# Patient Record
Sex: Male | Born: 1935 | Race: White | Hispanic: No | Smoking: Former smoker
Health system: Southern US, Community
[De-identification: ages and names within clinical notes are randomized; demographics above are authoritative.]

## PROBLEM LIST (undated history)

## (undated) DIAGNOSIS — F0390 Unspecified dementia without behavioral disturbance: Secondary | ICD-10-CM

## (undated) DIAGNOSIS — R0600 Dyspnea, unspecified: Secondary | ICD-10-CM

## (undated) DIAGNOSIS — I4891 Unspecified atrial fibrillation: Secondary | ICD-10-CM

## (undated) DIAGNOSIS — K831 Obstruction of bile duct: Secondary | ICD-10-CM

## (undated) DIAGNOSIS — N138 Other obstructive and reflux uropathy: Secondary | ICD-10-CM

## (undated) DIAGNOSIS — N50819 Testicular pain, unspecified: Secondary | ICD-10-CM

## (undated) DIAGNOSIS — N492 Inflammatory disorders of scrotum: Secondary | ICD-10-CM

## (undated) DIAGNOSIS — N401 Enlarged prostate with lower urinary tract symptoms: Secondary | ICD-10-CM

## (undated) DIAGNOSIS — K5790 Diverticulosis of intestine, part unspecified, without perforation or abscess without bleeding: Secondary | ICD-10-CM

## (undated) DIAGNOSIS — G459 Transient cerebral ischemic attack, unspecified: Secondary | ICD-10-CM

## (undated) DIAGNOSIS — N451 Epididymitis: Secondary | ICD-10-CM

## (undated) DIAGNOSIS — R972 Elevated prostate specific antigen [PSA]: Secondary | ICD-10-CM

## (undated) DIAGNOSIS — R351 Nocturia: Secondary | ICD-10-CM

## (undated) DIAGNOSIS — N200 Calculus of kidney: Secondary | ICD-10-CM

## (undated) DIAGNOSIS — R339 Retention of urine, unspecified: Secondary | ICD-10-CM

## (undated) DIAGNOSIS — Z87442 Personal history of urinary calculi: Secondary | ICD-10-CM

## (undated) DIAGNOSIS — K8020 Calculus of gallbladder without cholecystitis without obstruction: Secondary | ICD-10-CM

## (undated) DIAGNOSIS — I499 Cardiac arrhythmia, unspecified: Secondary | ICD-10-CM

## (undated) DIAGNOSIS — I729 Aneurysm of unspecified site: Secondary | ICD-10-CM

## (undated) DIAGNOSIS — J449 Chronic obstructive pulmonary disease, unspecified: Secondary | ICD-10-CM

## (undated) DIAGNOSIS — E785 Hyperlipidemia, unspecified: Secondary | ICD-10-CM

## (undated) DIAGNOSIS — R310 Gross hematuria: Secondary | ICD-10-CM

## (undated) HISTORY — DX: Obstruction of bile duct: K83.1

---

## 2008-03-06 ENCOUNTER — Ambulatory Visit: Payer: Self-pay | Admitting: Family Medicine

## 2008-03-18 ENCOUNTER — Ambulatory Visit: Payer: Self-pay | Admitting: Urology

## 2008-03-21 ENCOUNTER — Ambulatory Visit: Payer: Self-pay | Admitting: Urology

## 2008-10-01 ENCOUNTER — Ambulatory Visit: Payer: Self-pay | Admitting: Family Medicine

## 2012-07-17 ENCOUNTER — Ambulatory Visit: Payer: Self-pay | Admitting: Urology

## 2012-08-14 ENCOUNTER — Emergency Department: Payer: Self-pay | Admitting: Emergency Medicine

## 2012-08-14 LAB — COMPREHENSIVE METABOLIC PANEL
Alkaline Phosphatase: 90 U/L (ref 50–136)
BUN: 17 mg/dL (ref 7–18)
Bilirubin,Total: 0.9 mg/dL (ref 0.2–1.0)
Chloride: 102 mmol/L (ref 98–107)
Co2: 26 mmol/L (ref 21–32)
Creatinine: 0.96 mg/dL (ref 0.60–1.30)
EGFR (Non-African Amer.): >60
Glucose: 122 mg/dL — ABNORMAL HIGH (ref 65–99)
Osmolality: 271 (ref 275–301)
SGPT (ALT): 15 U/L (ref 12–78)
Sodium: 134 mmol/L — ABNORMAL LOW (ref 136–145)
Total Protein: 7.1 g/dL (ref 6.4–8.2)

## 2012-08-14 LAB — URINALYSIS, COMPLETE
Bilirubin,UR: NEGATIVE
Glucose,UR: NEGATIVE mg/dL (ref 0–75)
Ketone: NEGATIVE
Ph: 5 (ref 4.5–8.0)
RBC,UR: 209 /HPF (ref 0–5)
Squamous Epithelial: NONE SEEN
WBC UR: 627 /HPF (ref 0–5)

## 2012-08-14 LAB — CBC
HCT: 37.8 % — ABNORMAL LOW (ref 40.0–52.0)
MCHC: 33.2 g/dL (ref 32.0–36.0)
RDW: 12.4 % (ref 11.5–14.5)
WBC: 15.9 10*3/uL — ABNORMAL HIGH (ref 3.8–10.6)

## 2012-08-20 LAB — CULTURE, BLOOD (SINGLE)

## 2012-10-06 DIAGNOSIS — R339 Retention of urine, unspecified: Secondary | ICD-10-CM | POA: Insufficient documentation

## 2012-10-06 DIAGNOSIS — N400 Enlarged prostate without lower urinary tract symptoms: Secondary | ICD-10-CM | POA: Insufficient documentation

## 2013-08-14 ENCOUNTER — Ambulatory Visit: Payer: Self-pay | Admitting: Urology

## 2013-09-17 ENCOUNTER — Emergency Department: Payer: Self-pay | Admitting: Emergency Medicine

## 2013-09-17 LAB — CBC
HCT: 43 % (ref 40.0–52.0)
HGB: 14.2 g/dL (ref 13.0–18.0)
MCHC: 33.1 g/dL (ref 32.0–36.0)
MCV: 91 fL (ref 80–100)
Platelet: 183 10*3/uL (ref 150–440)
RDW: 13.9 % (ref 11.5–14.5)

## 2013-09-17 LAB — CK TOTAL AND CKMB (NOT AT ARMC): CK-MB: 1.4 ng/mL (ref 0.5–3.6)

## 2013-09-17 LAB — COMPREHENSIVE METABOLIC PANEL
Albumin: 3.4 g/dL (ref 3.4–5.0)
BUN: 11 mg/dL (ref 7–18)
Bilirubin,Total: 0.5 mg/dL (ref 0.2–1.0)
Calcium, Total: 8.9 mg/dL (ref 8.5–10.1)
Chloride: 107 mmol/L (ref 98–107)
EGFR (African American): >60
EGFR (Non-African Amer.): >60
Glucose: 111 mg/dL — ABNORMAL HIGH (ref 65–99)
Osmolality: 276 (ref 275–301)
SGOT(AST): 22 U/L (ref 15–37)

## 2013-09-17 LAB — TROPONIN I: Troponin-I: <0.02 ng/mL

## 2013-10-24 ENCOUNTER — Ambulatory Visit: Payer: Self-pay | Admitting: Cardiovascular Disease

## 2014-06-21 ENCOUNTER — Inpatient Hospital Stay: Payer: Self-pay | Admitting: Internal Medicine

## 2014-06-21 LAB — COMPREHENSIVE METABOLIC PANEL
ALK PHOS: 107 U/L
ALT: 55 U/L
Albumin: 3.8 g/dL (ref 3.4–5.0)
Anion Gap: 6 — ABNORMAL LOW (ref 7–16)
BUN: 17 mg/dL (ref 7–18)
Bilirubin,Total: 3 mg/dL — ABNORMAL HIGH (ref 0.2–1.0)
CALCIUM: 8.5 mg/dL (ref 8.5–10.1)
CREATININE: 1.31 mg/dL — AB (ref 0.60–1.30)
Chloride: 104 mmol/L (ref 98–107)
Co2: 28 mmol/L (ref 21–32)
EGFR (African American): >60
EGFR (Non-African Amer.): 56 — ABNORMAL LOW
Glucose: 110 mg/dL — ABNORMAL HIGH (ref 65–99)
Osmolality: 278 (ref 275–301)
POTASSIUM: 3.7 mmol/L (ref 3.5–5.1)
SGOT(AST): 85 U/L — ABNORMAL HIGH (ref 15–37)
Sodium: 138 mmol/L (ref 136–145)
Total Protein: 7.7 g/dL (ref 6.4–8.2)

## 2014-06-21 LAB — PHOSPHORUS: Phosphorus: 2.8 mg/dL (ref 2.5–4.9)

## 2014-06-21 LAB — URINALYSIS, COMPLETE
Bilirubin,UR: NEGATIVE
Glucose,UR: NEGATIVE mg/dL (ref 0–75)
NITRITE: NEGATIVE
Ph: 5 (ref 4.5–8.0)
Protein: NEGATIVE
SQUAMOUS EPITHELIAL: NONE SEEN
Specific Gravity: 1.019 (ref 1.003–1.030)
WBC UR: 15 /HPF (ref 0–5)

## 2014-06-21 LAB — CBC WITH DIFFERENTIAL/PLATELET
Basophil #: 0.1 10*3/uL (ref 0.0–0.1)
Basophil %: 0.7 %
EOS PCT: 0 %
Eosinophil #: 0 10*3/uL (ref 0.0–0.7)
HCT: 46.3 % (ref 40.0–52.0)
HGB: 15.5 g/dL (ref 13.0–18.0)
LYMPHS PCT: 2.7 %
Lymphocyte #: 0.3 10*3/uL — ABNORMAL LOW (ref 1.0–3.6)
MCH: 31.8 pg (ref 26.0–34.0)
MCHC: 33.5 g/dL (ref 32.0–36.0)
MCV: 95 fL (ref 80–100)
MONO ABS: 0.4 x10 3/mm (ref 0.2–1.0)
MONOS PCT: 3.2 %
NEUTROS PCT: 93.4 %
Neutrophil #: 12 10*3/uL — ABNORMAL HIGH (ref 1.4–6.5)
Platelet: 169 10*3/uL (ref 150–440)
RBC: 4.87 10*6/uL (ref 4.40–5.90)
RDW: 13.3 % (ref 11.5–14.5)
WBC: 12.8 10*3/uL — ABNORMAL HIGH (ref 3.8–10.6)

## 2014-06-21 LAB — PROTIME-INR
INR: 1.2
Prothrombin Time: 15.3 secs — ABNORMAL HIGH (ref 11.5–14.7)

## 2014-06-21 LAB — MAGNESIUM: Magnesium: 1.3 mg/dL — ABNORMAL LOW

## 2014-06-21 LAB — TROPONIN I: Troponin-I: <0.02 ng/mL

## 2014-06-22 LAB — BASIC METABOLIC PANEL
ANION GAP: 5 — AB (ref 7–16)
BUN: 15 mg/dL (ref 7–18)
CO2: 25 mmol/L (ref 21–32)
Calcium, Total: 7.6 mg/dL — ABNORMAL LOW (ref 8.5–10.1)
Chloride: 113 mmol/L — ABNORMAL HIGH (ref 98–107)
Creatinine: 1.21 mg/dL (ref 0.60–1.30)
EGFR (African American): >60
Glucose: 129 mg/dL — ABNORMAL HIGH (ref 65–99)
Osmolality: 288 (ref 275–301)
Potassium: 4 mmol/L (ref 3.5–5.1)
Sodium: 143 mmol/L (ref 136–145)

## 2014-06-22 LAB — BILIRUBIN, TOTAL: Bilirubin,Total: 3.9 mg/dL — ABNORMAL HIGH (ref 0.2–1.0)

## 2014-06-22 LAB — CBC WITH DIFFERENTIAL/PLATELET
BASOS PCT: 0.1 %
Basophil #: 0 10*3/uL (ref 0.0–0.1)
EOS ABS: 0 10*3/uL (ref 0.0–0.7)
Eosinophil %: 0 %
HCT: 38.9 % — AB (ref 40.0–52.0)
HGB: 12.9 g/dL — AB (ref 13.0–18.0)
LYMPHS PCT: 4.3 %
Lymphocyte #: 0.7 10*3/uL — ABNORMAL LOW (ref 1.0–3.6)
MCH: 31.4 pg (ref 26.0–34.0)
MCHC: 33 g/dL (ref 32.0–36.0)
MCV: 95 fL (ref 80–100)
Monocyte #: 1.3 x10 3/mm — ABNORMAL HIGH (ref 0.2–1.0)
Monocyte %: 7.5 %
NEUTROS PCT: 88.1 %
Neutrophil #: 15.1 10*3/uL — ABNORMAL HIGH (ref 1.4–6.5)
Platelet: 159 10*3/uL (ref 150–440)
RBC: 4.09 10*6/uL — ABNORMAL LOW (ref 4.40–5.90)
RDW: 13.6 % (ref 11.5–14.5)
WBC: 17.2 10*3/uL — ABNORMAL HIGH (ref 3.8–10.6)

## 2014-06-22 LAB — TSH: Thyroid Stimulating Horm: 0.343 u[IU]/mL — ABNORMAL LOW

## 2014-06-22 LAB — BILIRUBIN, DIRECT: Bilirubin, Direct: 2.3 mg/dL — ABNORMAL HIGH (ref 0.00–0.20)

## 2014-06-22 LAB — HEMOGLOBIN A1C: Hemoglobin A1C: 5.8 % (ref 4.2–6.3)

## 2014-06-22 LAB — MAGNESIUM: MAGNESIUM: 2.3 mg/dL

## 2014-06-23 LAB — COMPREHENSIVE METABOLIC PANEL
ALBUMIN: 2.6 g/dL — AB (ref 3.4–5.0)
ANION GAP: 6 — AB (ref 7–16)
Alkaline Phosphatase: 113 U/L
BUN: 19 mg/dL — ABNORMAL HIGH (ref 7–18)
Bilirubin,Total: 3.1 mg/dL — ABNORMAL HIGH (ref 0.2–1.0)
CALCIUM: 7.5 mg/dL — AB (ref 8.5–10.1)
Chloride: 112 mmol/L — ABNORMAL HIGH (ref 98–107)
Co2: 25 mmol/L (ref 21–32)
Creatinine: 1.15 mg/dL (ref 0.60–1.30)
Glucose: 111 mg/dL — ABNORMAL HIGH (ref 65–99)
Osmolality: 288 (ref 275–301)
POTASSIUM: 3.6 mmol/L (ref 3.5–5.1)
SGOT(AST): 41 U/L — ABNORMAL HIGH (ref 15–37)
SGPT (ALT): 59 U/L
Sodium: 143 mmol/L (ref 136–145)
Total Protein: 5.7 g/dL — ABNORMAL LOW (ref 6.4–8.2)

## 2014-06-23 LAB — CBC WITH DIFFERENTIAL/PLATELET
BASOS ABS: 0 10*3/uL (ref 0.0–0.1)
Basophil %: 0.4 %
EOS ABS: 0.1 10*3/uL (ref 0.0–0.7)
Eosinophil %: 0.6 %
HCT: 38.4 % — ABNORMAL LOW (ref 40.0–52.0)
HGB: 12.3 g/dL — ABNORMAL LOW (ref 13.0–18.0)
LYMPHS ABS: 0.7 10*3/uL — AB (ref 1.0–3.6)
Lymphocyte %: 6.5 %
MCH: 30.7 pg (ref 26.0–34.0)
MCHC: 32 g/dL (ref 32.0–36.0)
MCV: 96 fL (ref 80–100)
MONOS PCT: 9.6 %
Monocyte #: 1 x10 3/mm (ref 0.2–1.0)
NEUTROS ABS: 9 10*3/uL — AB (ref 1.4–6.5)
Neutrophil %: 82.9 %
PLATELETS: 136 10*3/uL — AB (ref 150–440)
RBC: 4 10*6/uL — AB (ref 4.40–5.90)
RDW: 13.7 % (ref 11.5–14.5)
WBC: 10.9 10*3/uL — AB (ref 3.8–10.6)

## 2014-06-23 LAB — URINE CULTURE

## 2014-06-23 LAB — CULTURE, BLOOD (SINGLE)

## 2014-06-24 LAB — HEPATIC FUNCTION PANEL A (ARMC)
AST: 28 U/L (ref 15–37)
Albumin: 2.6 g/dL — ABNORMAL LOW (ref 3.4–5.0)
Alkaline Phosphatase: 140 U/L — ABNORMAL HIGH
BILIRUBIN TOTAL: 1.8 mg/dL — AB (ref 0.2–1.0)
Bilirubin, Direct: 0.9 mg/dL — ABNORMAL HIGH (ref 0.00–0.20)
SGPT (ALT): 48 U/L
Total Protein: 5.8 g/dL — ABNORMAL LOW (ref 6.4–8.2)

## 2014-06-27 LAB — CULTURE, BLOOD (SINGLE)

## 2014-07-22 ENCOUNTER — Ambulatory Visit: Payer: Self-pay | Admitting: Gastroenterology

## 2014-10-01 DIAGNOSIS — I4891 Unspecified atrial fibrillation: Secondary | ICD-10-CM | POA: Insufficient documentation

## 2014-10-01 DIAGNOSIS — E7800 Pure hypercholesterolemia, unspecified: Secondary | ICD-10-CM | POA: Insufficient documentation

## 2014-11-12 ENCOUNTER — Ambulatory Visit: Payer: Self-pay | Admitting: Cardiovascular Disease

## 2015-01-14 NOTE — Consult Note (Signed)
PATIENT NAME:  Chris Simon, Chris Simon MR#:  [id number] DATE OF BIRTH:  Feb 11, 1934  DATE OF CONSULTATION:  08/14/2012  REFERRING PHYSICIAN:  Dr. Lovena Le  CONSULTING PHYSICIAN:  Otelia Limes. Yves Dill, MD  REASON FOR CONSULTATION:  Left testicular swelling.   HISTORY OF PRESENT ILLNESS: Chris Simon is a 81 year old white male with a two-day history of left testicular pain and swelling. He denies perineal trauma. He apparently has urinary retention and he has had a Foley catheter in for about a month now. He has been on Cipro recently. He is normally followed by Dr. Charyl Dancer. His records are not available to me at this time.   PHYSICAL EXAMINATION:   The patient was uncircumcised. There was some erythema of the left hemiscrotum. Left epididymis was swollen and tender, right testis atrophic, 18 mL in size, left testis 18 mL in size. There was no crepitus or fluctuance of the scrotal skin.   Scrotal ultrasound report was consistent with epididymitis and bilateral hydroceles and left varicocele.   PERTINENT LABORATORY STUDIES: BUN of 17 and creatinine of 0.96. CBC indicated a white cell count of 15,900 with hematocrit of 37.8%.   IMPRESSION:  1. Left epididymitis.  2. Urinary retention.   PLAN:  1. Septra DS one p.o. b.i.d.  2. Urine for culture and sensitivities and switch antibiotics if indicated.  3. Drink plenty of fluids.  4. Scrotal support.      5. Follow up with Dr. Madelin Headings as scheduled tomorrow.      ____________________________ Otelia Limes. Yves Dill, MD mrw:bjt D: 08/14/2012 14:30:00 ET T: 08/14/2012 15:12:18 ET JOB#: [id number]  cc: Otelia Limes. Yves Dill, MD, <Dictator> Royston Cowper MD ELECTRONICALLY SIGNED 08/14/2012 16:56

## 2015-01-18 NOTE — H&P (Signed)
PATIENT NAME:  Chris Simon, Chris Simon MR#:  [id number] DATE OF BIRTH:  1934-07-28  DATE OF ADMISSION:  06/21/2014  PRIMARY CARE PHYSICIAN: [name], MD.  REFERRING PHYSICIAN: Verdia Kuba. Paduchowski, MD.  CHIEF COMPLAINT: Shaking, started today.   HISTORY OF PRESENT ILLNESS: A 81 year old, Caucasian male with a history of CAD, Afib, BPH, diverticulitis presented to the ED with the above chief complaint. The patient is alert, awake, oriented, in no acute distress. According to the patient and the patient's daughter, the patient was fine until today. The patient started to have shaking. In addition, the patient has a fever with a temperature of 100.3 in the ED. The patient's has urinary frequency and dark urine, but denies any other urinary symptoms. The patient denies any chest pain, palpitations, but the patient said that he fell by accident yesterday. The patient denies any abdominal pain, nausea, vomiting or diarrhea, but has bilateral flank pain.   PAST MEDICAL HISTORY: CAD, Afib, BPH, diverticulitis.   SOCIAL HISTORY: No smoking, drinking or illicit drugs.   FAMILY HISTORY: Father and brother had prostate cancer. No hypertension, diabetes, heart attack in his family.   PAST SURGICAL HISTORY: Diverticulitis surgery a long time ago.   ALLERGIES: None.   HOME MEDICATIONS: Flomax 0.4 mg p.o. daily, Zocor 40 mg p.o. at bedtime, Lopressor 25 mg p.o. daily, finasteride 5 mg p.o. daily, Eliquis 5 mg p.o. b.i.d., amiodarone 200 mg p.o. daily.   REVIEW OF SYSTEMS:   CONSTITUTIONAL: Patient has a fever, chills, but no headache or dizziness.  EYES: No double vision, blurred vision.   ENT: No postnasal drip, slurred speech or dysphagia.  CARDIOVASCULAR: No chest pain, palpitations, orthopnea, nocturnal dyspnea. No leg edema.  PULMONARY: No cough, sputum, shortness of breath or hemoptysis.  GASTROINTESTINAL: No abdominal pain, nausea, vomiting, diarrhea. No melena or bloody stool.  GENITOURINARY:  Dysuria, hematuria or incontinence.  SKIN: No rash or jaundice.  NEUROLOGY: No syncope, loss of consciousness or seizure.  ENDOCRINOLOGY: No polyuria, polydipsia, heat or cold intolerance.  HEMATOLOGY: No easy bleeding or bleeding.   PHYSICAL EXAMINATION: VITAL SIGNS: Temperature 100.3, blood pressure 133/69, pulse 120 and now decreased to 111, oxygen saturation 96% on oxygen.  GENERAL: The patient is alert, awake, oriented, in no acute distress.  HEENT: Pupils round, equally reactive to light and accommodation.  Moist mucosa. Clear oropharynx. NECK: Supple. No JVD or carotid bruit. No lymphadenopathy. No thyromegaly.  CARDIOVASCULAR: S1 and S2. Regular rate, tachycardia. No murmurs. No gallops. PULMONARY: Bilateral air entry. No wheezing or rales. No use of accessory muscles to breathe.  ABDOMEN: Soft. No distention or tenderness. No organomegaly. Bowel sounds present.  EXTREMITIES: No edema, clubbing or cyanosis. No calf tenderness. Bilateral pedal pulses present.  SKIN: No rash or jaundice.  NEUROLOGIC: A and O x 3. No focal deficits. Power 5/5. Sensation intact.   LABORATORY DATA: Urinalysis showed WBC 15, RBC 5, nitrite negative.  ABG showed a pH of 7.45, P)2 of 65, pCO2 of 33. Lactic acid 1.0. WBC 12.8, hemoglobin 15.5, platelets 169,000. Glucose 110. BUN 17, creatinine 1.31. Electrolytes normal except magnesium 1.3. Troponin less than 0.02. Phosphate 2.8. INR 1.2.   DIAGNOSTIC DATA: CT scan of head: No acute intracranial pathology. Chest x-ray: No acute disease.  IMPRESSIONS: 1. Sepsis.  2. Urinary tract infection.  3. Mild dehydration.  4. Hypomagnesemia.  5. Atrial fibrillation with rapid ventricular response.  6. Benign prostatic hypertrophy.  7. History of coronary artery disease.   PLAN OF TREATMENT: 1.  The patient will be admitted to the medical floor with telemonitor. We will start Rocephin IVPB, follow up urine culture, blood culture and a CBC.  2. Hypomagnesemia. We  will give magnesium supplement and follow up level.  3. In addition, we will give IV fluid support. Follow up BMP.  4. Coronary artery disease and atrial fibrillation with rapid ventricular response. We will continue Eliquis, amiodarone and Lopressor.  5. I discussed the patient's condition and plan of treatment with the patient and the patient's daughter.   CODE STATUS: The patient is full code.   TIME SPENT: About 52 minutes.     ____________________________ Demetrios Loll, MD qc:TT D: 06/21/2014 20:03:32 ET T: 06/21/2014 21:18:47 ET JOB#: [id number]  cc: Demetrios Loll, MD, <Dictator> Demetrios Loll MD ELECTRONICALLY SIGNED 06/21/2014 22:26

## 2015-01-18 NOTE — Consult Note (Signed)
Brief Consult Note: Diagnosis: recent fall, fever, jaundice and elevated wbc, ? cholecystitis.   Patient was seen by consultant.   Consult note dictated.   Recommend to proceed with surgery or procedure.   Recommend further assessment or treatment.   Orders entered.   Discussed with Attending MD.   Comments: stop Eliquis HIDA scan   any surgery would have to wait until monday am to give anticoag time to reverse.  Electronic Signatures: Sherri Rad (MD)  (Signed 26-Sep-15 12:02)  Authored: Brief Consult Note   Last Updated: 26-Sep-15 12:02 by Sherri Rad (MD)

## 2015-01-18 NOTE — Discharge Summary (Signed)
PATIENT NAME:  Chris Simon, Chris Simon MR#:  [id number] DATE OF BIRTH:  1933/11/16  DATE OF ADMISSION:  06/21/2014 DATE OF DISCHARGE:  06/24/2014  DISCHARGE DIAGNOSES:  1.  Escherichia coli sepsis due to urinary tract infection.  2.  Chronic cholecystitis.  3.  Atrial fibrillation with rapid ventricular rate.   SECONDARY DIAGNOSES:  1.  Coronary artery disease.  2.  Atrial fibrillation.  3.  Benign prostatic hypertrophy.  4.  Diverticulitis.   CONSULTATION: Surgery, Sherri Rad, MD.   PROCEDURES AND RADIOLOGY: Chest x-ray on the 25th of September showed no active disease.   CT scan of the head without contrast on the 25th of September showed no acute intracranial pathology.    Hepatobiliary scan on the 26th of September showed chronic cholecystitis.   Abdominal ultrasound on the 26th of September showed cholelithiasis with gallbladder wall thickening and pericholecystic/perihepatic fluid, findings concerning for acute cholecystitis.   Urinalysis on admission showed 15 WBCs with bacteria, trace leukocyte esterase.   Urine culture grew 70,000 colonies of Escherichia coli. Blood cultures x 4 grew Escherichia coli.   HISTORY AND SHORT HOSPITAL COURSE: The patient is a 81 year old male with above-mentioned medical problems who was admitted for shaking and fever concerning for sepsis, thought to be secondary to urinary tract infection. Please see Dr. Lianne Moris dictated history and physical for further details. There was also concern for possible cholecystitis, for which surgical consultation was obtained who recommended HIDA scan which was performed, showing chronic cholecystitis. Initial ultrasound was concerning for acute cholecystitis although this was verified by surgery, Dr. Marina Gravel, with HIDA scan. Both were in favor of chronic cholecystitis. The patient's sepsis/bacteremia was Escherichia coli, thought to be from urinary tract infection. The patient was feeling much better by the 28th of September and is  being discharged home in stable condition.   PHYSICAL EXAMINATION:  VITAL SIGNS: On the date of discharge, his vital signs are as follows: Temperature 98.6, heart rate 82 per minute, respirations 20 per minute, blood pressure 158/91. He was saturating 95% on room. Pertinent physical examination on the date of discharge: CARDIOVASCULAR: S1, S2 normal. No murmurs, rubs or gallops.  LUNGS: Clear to auscultation bilaterally. No wheezing, rales, rhonchi or crepitation.  ABDOMEN: Soft, benign.  NEUROLOGIC: Nonfocal examination. All other physical examination remained at baseline.   DISCHARGE MEDICATIONS:  1.  Amiodarone 200 mg p.o. daily. 2.  Finasteride 5 mg p.o. daily.  3.  Tamsulosin 0.4 mg p.o. daily.  4.  Simvastatin 40 mg p.o. at bedtime.  5.  Metoprolol 25 mg p.o. daily. 6.  Eliquis 5 mg p.o. b.i.d.  7.  Ceftin 500 mg p.o. b.i.d. for 11 days.   DISCHARGE DIET: Low sodium.   DISCHARGE ACTIVITY: As tolerated.   DISCHARGE INSTRUCTIONS AND FOLLOWUP: The patient was instructed to followup with Dr. Thereasa Distance, who is his primary care physician in 1-2 weeks. He will need followup with surgery, Dr. Sherri Rad, in 2-4 weeks.   TOTAL TIME DISCHARGING THIS PATIENT: Fifty-five minutes.  Please note, the patient's wife was assessed by hospice on the 27th of September and was given a very poor prognosis, about 2-3 days to live, for which family really wanted the patient to be discharged as soon as possible this morning so that he can see his wife, which was discussed with a family member and the patient was not informed about that as the family wanted him to first get home and then make him aware about the situation. Due to this  reason, his discharge orders were expedited.    ____________________________ Chris Dunton S. Chris Ghazi, MD vss:TT D: 06/24/2014 22:05:51 ET T: 06/24/2014 22:25:55 ET JOB#: [id number]  cc: [name] S. Chris Ghazi, MD, <Dictator> [name], MD [name], [place]  MD ELECTRONICALLY SIGNED 07/02/2014 15:46

## 2015-01-18 NOTE — Consult Note (Signed)
PATIENT NAME:  Chris Simon, Chris Simon MR#:  [id number] DATE OF BIRTH:  06-21-1934  DATE OF CONSULTATION:  06/22/2014  REFERRING PHYSICIAN:   CONSULTING PHYSICIAN:  Fabion Gatson A. Marina Gravel, MD  REASON FOR CONSULTATION: Possible acute cholecystitis.   HISTORY: This 81 year old white male admitted on the September 25 with fever to 100.3, chills, and some bilateral flank pain. Pain has since resolved.   Prior to admission he sustained a fall without LOC. The patient is a bit confused as to his history as to why he is in the hospital. Upon my evaluation the patient denied any abdominal pain, nausea, vomiting, diarrhea. He was found to have elevated bilirubin upon admission as well as a white count of 12,800. This prompted an ultrasound of the right upper quadrant which I have personally reviewed. Findings on the ultrasound demonstrate pericholecystic fluid, gallbladder wall thickening, bile duct measuring 4 mm. There is evidence of cholelithiasis. Concerns were for acute cholecystitis and as such surgical services were asked to evaluate the patient. Again the patient denies any current abdominal pain or GI symptoms.   Yesterday his white count was found to be elevated at 17.2. His bilirubin found to be 3.9 with a direct fraction of 2.3, but normal transaminases and alkaline phosphatase. Electrolytes are unremarkable. The patient denies any previous abdominal pain as such.    ALLERGIES: None.   HOME MEDICATIONS:  1.  Amiodarone.   2.  Eliquis 5 mg b.i.d.  3.  Finasteride 5 mg by mouth once a day.  4.  Metoprolol 25 mg extended-release once a day.  5.  Simvastatin 40 mg once a day.  6.  Flomax 0.4 mg by mouth once a day.   PAST MEDICAL HISTORY: Significant for atrial fibrillation requiring Eliquis, coronary artery disease, benign prostatic hypertrophy, history of diverticulosis, and diverticulitis.   SOCIAL HISTORY: The patient denies smoking, drinking, or drugs.   FAMILY HISTORY: Significant for prostate cancer.    PAST SURGICAL HISTORY: Colon surgery for diverticulitis in the distant past.   REVIEW OF SYSTEMS:  As described above, significant for chills, fevers. No abdominal pain. No nausea, no vomiting, no bloody diarrhea.   PHYSICAL EXAMINATION:  GENERAL: The patient is obviously in no distress. He has mild scleral icterus.  VITAL SIGNS: Temperature is 98.1, pulse of 76, blood pressure is 118/70. Pulse oximetry on room air is 96%.  LUNGS: Clear.  HEART: Irregularly irregular.  ABDOMEN: Soft and nontender. There is no Murphy sign. There are no peritoneal signs. There are no hernias. There is no tenderness in the right upper quadrant or in the left upper quadrant or in the epigastric region.  EXTREMITIES: Warm and well perfused.  NEUROLOGIC AND PSYCHIATRIC: Grossly normal.   LABORATORY VALUES: Glucose 129, creatinine 1.21, sodium 143, potassium 4.0, chloride 113. Total bilirubin 3.9, direct fraction 2.3, AST 85, ALT 55, alkaline phosphatase 107. White count is 17.2, hemoglobin 12.9, hematocrit 38.9, platelet count 159,000, with left shift.   IMPRESSION: Fever, leukocytosis, cholelithiasis, and no abdominal findings or history of abdominal pain. The patient did have some flank pain.   RECOMMENDATIONS: I would proceed with a HIDA scan and hold his Eliquis in the need of any case of operative intervention as this requires 2 days for reversal. I discussed this with [facility] medical physician and they agreed to proceed in this fashion. I will follow up with you after the HIDA scan which has been ordered for today.     ____________________________ [name], MD mab:bu D: 06/23/2014 08:49:39  ET T: 06/23/2014 13:03:00 ET JOB#: [id number]  cc: Elta Guadeloupe A. Marina Gravel, MD, <Dictator> Hortencia Conradi MD ELECTRONICALLY SIGNED 06/23/2014 16:56

## 2015-04-14 ENCOUNTER — Other Ambulatory Visit: Payer: Self-pay | Admitting: Family Medicine

## 2015-04-14 ENCOUNTER — Ambulatory Visit
Admission: RE | Admit: 2015-04-14 | Discharge: 2015-04-14 | Disposition: A | Discharge status: Home / Self Care | Payer: Medicare HMO | Source: Ambulatory Visit | Attending: Family Medicine | Admitting: Family Medicine

## 2015-04-14 DIAGNOSIS — N508 Other specified disorders of male genital organs: Secondary | ICD-10-CM | POA: Diagnosis present

## 2015-04-14 DIAGNOSIS — N50811 Right testicular pain: Secondary | ICD-10-CM

## 2015-04-14 DIAGNOSIS — N4400 Torsion of testis, unspecified: Secondary | ICD-10-CM

## 2015-04-14 DIAGNOSIS — N5089 Other specified disorders of the male genital organs: Secondary | ICD-10-CM

## 2015-04-14 DIAGNOSIS — N451 Epididymitis: Secondary | ICD-10-CM | POA: Diagnosis not present

## 2015-04-14 DIAGNOSIS — N452 Orchitis: Secondary | ICD-10-CM | POA: Insufficient documentation

## 2015-07-07 ENCOUNTER — Other Ambulatory Visit: Payer: Self-pay | Admitting: Urology

## 2015-07-08 ENCOUNTER — Encounter: Payer: Self-pay | Admitting: Emergency Medicine

## 2015-07-08 ENCOUNTER — Emergency Department: Payer: Medicare HMO

## 2015-07-08 ENCOUNTER — Emergency Department
Admission: EM | Admit: 2015-07-08 | Discharge: 2015-07-08 | Disposition: A | Discharge status: Home / Self Care | Payer: Medicare HMO | Source: Home / Self Care | Attending: Emergency Medicine | Admitting: Emergency Medicine

## 2015-07-08 ENCOUNTER — Emergency Department
Admission: EM | Admit: 2015-07-08 | Discharge: 2015-07-08 | Disposition: A | Discharge status: Other Acute Inpatient Hospital | Payer: Medicare HMO | Source: Home / Self Care | Attending: Emergency Medicine | Admitting: Emergency Medicine

## 2015-07-08 ENCOUNTER — Inpatient Hospital Stay (HOSPITAL_COMMUNITY)
Admission: AD | Admit: 2015-07-08 | Discharge: 2015-07-14 | DRG: 853 | Disposition: A | Discharge status: Home Health | Payer: Medicare HMO | Source: Other Acute Inpatient Hospital | Attending: Internal Medicine | Admitting: Internal Medicine

## 2015-07-08 DIAGNOSIS — K8066 Calculus of gallbladder and bile duct with acute and chronic cholecystitis without obstruction: Secondary | ICD-10-CM | POA: Diagnosis not present

## 2015-07-08 DIAGNOSIS — Z7982 Long term (current) use of aspirin: Secondary | ICD-10-CM | POA: Diagnosis not present

## 2015-07-08 DIAGNOSIS — K8030 Calculus of bile duct with cholangitis, unspecified, without obstruction: Secondary | ICD-10-CM | POA: Diagnosis not present

## 2015-07-08 DIAGNOSIS — E785 Hyperlipidemia, unspecified: Secondary | ICD-10-CM | POA: Diagnosis present

## 2015-07-08 DIAGNOSIS — I480 Paroxysmal atrial fibrillation: Secondary | ICD-10-CM | POA: Insufficient documentation

## 2015-07-08 DIAGNOSIS — N401 Enlarged prostate with lower urinary tract symptoms: Secondary | ICD-10-CM | POA: Diagnosis present

## 2015-07-08 DIAGNOSIS — G9341 Metabolic encephalopathy: Secondary | ICD-10-CM | POA: Diagnosis present

## 2015-07-08 DIAGNOSIS — K8033 Calculus of bile duct with acute cholangitis with obstruction: Secondary | ICD-10-CM

## 2015-07-08 DIAGNOSIS — Z79899 Other long term (current) drug therapy: Secondary | ICD-10-CM | POA: Diagnosis not present

## 2015-07-08 DIAGNOSIS — R739 Hyperglycemia, unspecified: Secondary | ICD-10-CM | POA: Diagnosis present

## 2015-07-08 DIAGNOSIS — A4150 Gram-negative sepsis, unspecified: Principal | ICD-10-CM | POA: Diagnosis present

## 2015-07-08 DIAGNOSIS — N390 Urinary tract infection, site not specified: Secondary | ICD-10-CM | POA: Diagnosis present

## 2015-07-08 DIAGNOSIS — R6521 Severe sepsis with septic shock: Secondary | ICD-10-CM | POA: Diagnosis present

## 2015-07-08 DIAGNOSIS — Z87891 Personal history of nicotine dependence: Secondary | ICD-10-CM | POA: Diagnosis not present

## 2015-07-08 DIAGNOSIS — N138 Other obstructive and reflux uropathy: Secondary | ICD-10-CM | POA: Diagnosis not present

## 2015-07-08 DIAGNOSIS — K810 Acute cholecystitis: Secondary | ICD-10-CM | POA: Diagnosis present

## 2015-07-08 DIAGNOSIS — K8050 Calculus of bile duct without cholangitis or cholecystitis without obstruction: Secondary | ICD-10-CM | POA: Diagnosis not present

## 2015-07-08 DIAGNOSIS — W19XXXA Unspecified fall, initial encounter: Secondary | ICD-10-CM | POA: Diagnosis not present

## 2015-07-08 DIAGNOSIS — A419 Sepsis, unspecified organism: Secondary | ICD-10-CM

## 2015-07-08 DIAGNOSIS — K4090 Unilateral inguinal hernia, without obstruction or gangrene, not specified as recurrent: Secondary | ICD-10-CM | POA: Diagnosis not present

## 2015-07-08 DIAGNOSIS — A4151 Sepsis due to Escherichia coli [E. coli]: Secondary | ICD-10-CM | POA: Diagnosis not present

## 2015-07-08 DIAGNOSIS — R319 Hematuria, unspecified: Secondary | ICD-10-CM

## 2015-07-08 DIAGNOSIS — K812 Acute cholecystitis with chronic cholecystitis: Secondary | ICD-10-CM | POA: Diagnosis not present

## 2015-07-08 DIAGNOSIS — N320 Bladder-neck obstruction: Secondary | ICD-10-CM | POA: Diagnosis present

## 2015-07-08 DIAGNOSIS — N179 Acute kidney failure, unspecified: Secondary | ICD-10-CM | POA: Diagnosis not present

## 2015-07-08 HISTORY — DX: Retention of urine, unspecified: R33.9

## 2015-07-08 HISTORY — DX: Unspecified atrial fibrillation: I48.91

## 2015-07-08 HISTORY — DX: Calculus of kidney: N20.0

## 2015-07-08 HISTORY — DX: Inflammatory disorders of scrotum: N49.2

## 2015-07-08 HISTORY — DX: Gross hematuria: R31.0

## 2015-07-08 HISTORY — DX: Nocturia: R35.1

## 2015-07-08 HISTORY — DX: Benign prostatic hyperplasia with lower urinary tract symptoms: N13.8

## 2015-07-08 HISTORY — DX: Calculus of gallbladder without cholecystitis without obstruction: K80.20

## 2015-07-08 HISTORY — DX: Benign prostatic hyperplasia with lower urinary tract symptoms: N40.1

## 2015-07-08 HISTORY — DX: Testicular pain, unspecified: N50.819

## 2015-07-08 HISTORY — DX: Aneurysm of unspecified site: I72.9

## 2015-07-08 HISTORY — DX: Epididymitis: N45.1

## 2015-07-08 HISTORY — DX: Elevated prostate specific antigen (PSA): R97.20

## 2015-07-08 LAB — CBC
HEMATOCRIT: 41.8 % (ref 40.0–52.0)
HEMOGLOBIN: 14 g/dL (ref 13.0–18.0)
MCH: 31.5 pg (ref 26.0–34.0)
MCHC: 33.5 g/dL (ref 32.0–36.0)
MCV: 93.9 fL (ref 80.0–100.0)
Platelets: 163 10*3/uL (ref 150–440)
RBC: 4.45 MIL/uL (ref 4.40–5.90)
RDW: 13.8 % (ref 11.5–14.5)
WBC: 14.9 10*3/uL — ABNORMAL HIGH (ref 3.8–10.6)

## 2015-07-08 LAB — MAGNESIUM: Magnesium: 1.4 mg/dL — ABNORMAL LOW (ref 1.7–2.4)

## 2015-07-08 LAB — URINALYSIS COMPLETE WITH MICROSCOPIC (ARMC ONLY)
BILIRUBIN URINE: NEGATIVE
GLUCOSE, UA: NEGATIVE mg/dL
Ketones, ur: NEGATIVE mg/dL
Leukocytes, UA: NEGATIVE
Nitrite: POSITIVE — AB
Protein, ur: NEGATIVE mg/dL
SQUAMOUS EPITHELIAL / LPF: NONE SEEN
Specific Gravity, Urine: 1.028 (ref 1.005–1.030)
pH: 5 (ref 5.0–8.0)

## 2015-07-08 LAB — APTT: APTT: 32 s (ref 24–36)

## 2015-07-08 LAB — CBC WITH DIFFERENTIAL/PLATELET
BASOS ABS: 0 10*3/uL (ref 0–0.1)
BASOS ABS: 0 10*3/uL (ref 0.0–0.1)
BASOS PCT: 0 %
BASOS PCT: 0 %
EOS ABS: 0 10*3/uL (ref 0.0–0.7)
Eosinophils Absolute: 0 10*3/uL (ref 0–0.7)
Eosinophils Relative: 0 %
Eosinophils Relative: 0 %
HEMATOCRIT: 44.1 % (ref 40.0–52.0)
HEMATOCRIT: 40.8 % (ref 39.0–52.0)
HEMOGLOBIN: 14.7 g/dL (ref 13.0–18.0)
HEMOGLOBIN: 13.5 g/dL (ref 13.0–17.0)
LYMPHS PCT: 3 %
Lymphocytes Relative: 2 %
Lymphs Abs: 0.4 10*3/uL — ABNORMAL LOW (ref 1.0–3.6)
Lymphs Abs: 0.3 10*3/uL — ABNORMAL LOW (ref 0.7–4.0)
MCH: 31.5 pg (ref 26.0–34.0)
MCH: 31.2 pg (ref 26.0–34.0)
MCHC: 33.4 g/dL (ref 32.0–36.0)
MCHC: 33.1 g/dL (ref 30.0–36.0)
MCV: 94.3 fL (ref 80.0–100.0)
MCV: 94.2 fL (ref 78.0–100.0)
MONO ABS: 0.3 10*3/uL (ref 0.2–1.0)
Monocytes Absolute: 0.9 10*3/uL (ref 0.1–1.0)
Monocytes Relative: 2 %
Monocytes Relative: 6 %
NEUTROS ABS: 14.4 10*3/uL — AB (ref 1.7–7.7)
NEUTROS PCT: 95 %
NEUTROS PCT: 92 %
Neutro Abs: 15.5 10*3/uL — ABNORMAL HIGH (ref 1.4–6.5)
Platelets: 166 10*3/uL (ref 150–440)
Platelets: 121 10*3/uL — ABNORMAL LOW (ref 150–400)
RBC: 4.67 MIL/uL (ref 4.40–5.90)
RBC: 4.33 MIL/uL (ref 4.22–5.81)
RDW: 13.7 % (ref 11.5–14.5)
RDW: 13.4 % (ref 11.5–15.5)
WBC: 16.3 10*3/uL — AB (ref 3.8–10.6)
WBC: 15.6 10*3/uL — AB (ref 4.0–10.5)

## 2015-07-08 LAB — BASIC METABOLIC PANEL
Anion gap: 11 (ref 5–15)
BUN: 21 mg/dL — AB (ref 6–20)
CALCIUM: 8 mg/dL — AB (ref 8.9–10.3)
CHLORIDE: 102 mmol/L (ref 101–111)
CO2: 21 mmol/L — AB (ref 22–32)
CREATININE: 1.27 mg/dL — AB (ref 0.61–1.24)
GFR calc non Af Amer: 52 mL/min — ABNORMAL LOW (ref >60)
Glucose, Bld: 139 mg/dL — ABNORMAL HIGH (ref 65–99)
Potassium: 3.3 mmol/L — ABNORMAL LOW (ref 3.5–5.1)
SODIUM: 134 mmol/L — AB (ref 135–145)

## 2015-07-08 LAB — COMPREHENSIVE METABOLIC PANEL
ALBUMIN: 3.8 g/dL (ref 3.5–5.0)
ALBUMIN: 3.9 g/dL (ref 3.5–5.0)
ALK PHOS: 112 U/L (ref 38–126)
ALK PHOS: 109 U/L (ref 38–126)
ALT: 98 U/L — ABNORMAL HIGH (ref 17–63)
ALT: 123 U/L — ABNORMAL HIGH (ref 17–63)
ANION GAP: 4 — AB (ref 5–15)
ANION GAP: 14 (ref 5–15)
AST: 196 U/L — AB (ref 15–41)
AST: 166 U/L — ABNORMAL HIGH (ref 15–41)
BILIRUBIN TOTAL: 2.6 mg/dL — AB (ref 0.3–1.2)
BILIRUBIN TOTAL: 4.8 mg/dL — AB (ref 0.3–1.2)
BUN: 22 mg/dL — AB (ref 6–20)
BUN: 26 mg/dL — ABNORMAL HIGH (ref 6–20)
CALCIUM: 8.7 mg/dL — AB (ref 8.9–10.3)
CALCIUM: 9.2 mg/dL (ref 8.9–10.3)
CO2: 27 mmol/L (ref 22–32)
CO2: 20 mmol/L — ABNORMAL LOW (ref 22–32)
Chloride: 107 mmol/L (ref 101–111)
Chloride: 103 mmol/L (ref 101–111)
Creatinine, Ser: 1.25 mg/dL — ABNORMAL HIGH (ref 0.61–1.24)
Creatinine, Ser: 1.18 mg/dL (ref 0.61–1.24)
GFR calc Af Amer: >60 mL/min (ref >60)
GFR, EST NON AFRICAN AMERICAN: 53 mL/min — AB (ref >60)
GFR, EST NON AFRICAN AMERICAN: 57 mL/min — AB (ref >60)
GLUCOSE: 171 mg/dL — AB (ref 65–99)
Glucose, Bld: 163 mg/dL — ABNORMAL HIGH (ref 65–99)
POTASSIUM: 4.2 mmol/L (ref 3.5–5.1)
Potassium: 3.8 mmol/L (ref 3.5–5.1)
Sodium: 138 mmol/L (ref 135–145)
Sodium: 137 mmol/L (ref 135–145)
TOTAL PROTEIN: 7 g/dL (ref 6.5–8.1)
TOTAL PROTEIN: 7.3 g/dL (ref 6.5–8.1)

## 2015-07-08 LAB — MRSA PCR SCREENING: MRSA BY PCR: NEGATIVE

## 2015-07-08 LAB — POCT I-STAT 3, ART BLOOD GAS (G3+)
ACID-BASE DEFICIT: 4 mmol/L — AB (ref 0.0–2.0)
Bicarbonate: 19.6 mEq/L — ABNORMAL LOW (ref 20.0–24.0)
O2 SAT: 94 %
PH ART: 7.376 (ref 7.350–7.450)
PO2 ART: 76 mmHg — AB (ref 80.0–100.0)
TCO2: 21 mmol/L (ref 0–100)
pCO2 arterial: 34 mmHg — ABNORMAL LOW (ref 35.0–45.0)

## 2015-07-08 LAB — PROTIME-INR
INR: 1.13
INR: 1.42 (ref 0.00–1.49)
PROTHROMBIN TIME: 14.7 s (ref 11.4–15.0)
PROTHROMBIN TIME: 17.4 s — AB (ref 11.6–15.2)

## 2015-07-08 LAB — AMYLASE: Amylase: 34 U/L (ref 28–100)

## 2015-07-08 LAB — LIPASE, BLOOD
LIPASE: 22 U/L (ref 22–51)
LIPASE: 22 U/L (ref 22–51)
Lipase: 31 U/L (ref 22–51)

## 2015-07-08 LAB — TROPONIN I

## 2015-07-08 LAB — PHOSPHORUS: Phosphorus: 1.5 mg/dL — ABNORMAL LOW (ref 2.5–4.6)

## 2015-07-08 LAB — LACTIC ACID, PLASMA
LACTIC ACID, VENOUS: 3.2 mmol/L — AB (ref 0.5–2.0)
LACTIC ACID, VENOUS: 2.6 mmol/L — AB (ref 0.5–2.0)

## 2015-07-08 MED ORDER — PIPERACILLIN-TAZOBACTAM 3.375 G IVPB
3.3750 g | Freq: Once | INTRAVENOUS | Status: AC
  Administered 2015-07-08: 3.375 g via INTRAVENOUS
  Filled 2015-07-08: qty 50

## 2015-07-08 MED ORDER — SODIUM CHLORIDE 0.9 % IV SOLN
INTRAVENOUS | Status: DC
  Administered 2015-07-08 – 2015-07-12 (×6): via INTRAVENOUS

## 2015-07-08 MED ORDER — CIPROFLOXACIN HCL 500 MG PO TABS: 500.0000 mg | ORAL_TABLET | Freq: Two times a day (BID) | ORAL | Status: DC

## 2015-07-08 MED ORDER — NITROFURANTOIN MONOHYD MACRO 100 MG PO CAPS: 100.0000 mg | ORAL_CAPSULE | Freq: Two times a day (BID) | ORAL | Status: DC

## 2015-07-08 MED ORDER — INSULIN ASPART 100 UNIT/ML ~~LOC~~ SOLN
2.0000 [IU] | SUBCUTANEOUS | Status: DC
  Administered 2015-07-08: 2 [IU] via SUBCUTANEOUS

## 2015-07-08 MED ORDER — ONDANSETRON HCL 4 MG/2ML IJ SOLN: 4.0000 mg | Freq: Four times a day (QID) | INTRAMUSCULAR | Status: DC | PRN

## 2015-07-08 MED ORDER — ACETAMINOPHEN 325 MG PO TABS: 650.0000 mg | ORAL_TABLET | ORAL | Status: DC | PRN

## 2015-07-08 MED ORDER — MIDAZOLAM HCL 2 MG/2ML IJ SOLN: 2.0000 mg | INTRAMUSCULAR | Status: DC | PRN

## 2015-07-08 MED ORDER — DEXTROSE 5 % IV SOLN: 1.0000 g | INTRAVENOUS | Status: DC

## 2015-07-08 MED ORDER — SODIUM CHLORIDE 0.9 % IV SOLN: 250.0000 mL | INTRAVENOUS | Status: DC | PRN

## 2015-07-08 MED ORDER — LIDOCAINE HCL 2 % EX GEL
1.0000 "application " | Freq: Once | CUTANEOUS | Status: AC
  Administered 2015-07-08: 1 via URETHRAL
  Filled 2015-07-08: qty 5

## 2015-07-08 MED ORDER — PIPERACILLIN-TAZOBACTAM 3.375 G IVPB
3.3750 g | Freq: Three times a day (TID) | INTRAVENOUS | Status: DC
  Administered 2015-07-08 – 2015-07-12 (×9): 3.375 g via INTRAVENOUS
  Filled 2015-07-08 (×14): qty 50

## 2015-07-08 MED ORDER — SODIUM CHLORIDE 0.9 % IV BOLUS (SEPSIS): 500.0000 mL | INTRAVENOUS | Status: DC

## 2015-07-08 MED ORDER — FENTANYL CITRATE (PF) 100 MCG/2ML IJ SOLN
INTRAMUSCULAR | Status: AC
  Administered 2015-07-08: 100 ug via INTRAVENOUS
  Filled 2015-07-08: qty 2

## 2015-07-08 MED ORDER — DEXTROSE 5 % IV SOLN
2.0000 g | Freq: Once | INTRAVENOUS | Status: AC
  Administered 2015-07-08: 2 g via INTRAVENOUS
  Filled 2015-07-08: qty 2

## 2015-07-08 MED ORDER — KETOROLAC TROMETHAMINE 30 MG/ML IJ SOLN
30.0000 mg | Freq: Once | INTRAMUSCULAR | Status: AC
  Administered 2015-07-08: 30 mg via INTRAMUSCULAR
  Filled 2015-07-08: qty 1

## 2015-07-08 MED ORDER — SODIUM CHLORIDE 0.9 % IV BOLUS (SEPSIS)
1000.0000 mL | INTRAVENOUS | Status: AC
  Administered 2015-07-08 (×2): 1000 mL via INTRAVENOUS

## 2015-07-08 MED ORDER — FAMOTIDINE IN NACL 20-0.9 MG/50ML-% IV SOLN
20.0000 mg | Freq: Two times a day (BID) | INTRAVENOUS | Status: DC
  Administered 2015-07-08 – 2015-07-11 (×7): 20 mg via INTRAVENOUS
  Filled 2015-07-08 (×10): qty 50

## 2015-07-08 MED ORDER — HEPARIN SODIUM (PORCINE) 5000 UNIT/ML IJ SOLN
5000.0000 [IU] | Freq: Three times a day (TID) | INTRAMUSCULAR | Status: DC
  Administered 2015-07-08 – 2015-07-14 (×15): 5000 [IU] via SUBCUTANEOUS
  Filled 2015-07-08 (×19): qty 1

## 2015-07-08 MED ORDER — METRONIDAZOLE IN NACL 5-0.79 MG/ML-% IV SOLN
500.0000 mg | Freq: Once | INTRAVENOUS | Status: AC
  Administered 2015-07-08: 500 mg via INTRAVENOUS
  Filled 2015-07-08: qty 100

## 2015-07-08 MED ORDER — FENTANYL CITRATE (PF) 100 MCG/2ML IJ SOLN: 50.0000 ug | INTRAMUSCULAR | Status: DC | PRN

## 2015-07-08 MED ORDER — FENTANYL CITRATE (PF) 100 MCG/2ML IJ SOLN
100.0000 ug | Freq: Once | INTRAMUSCULAR | Status: AC
  Administered 2015-07-08: 100 ug via INTRAVENOUS

## 2015-07-08 NOTE — Progress Notes (Signed)
ANTIBIOTIC CONSULT NOTE  Pharmacy Consult for Zosyn Indication: UTI  Allergies  Allergen Reactions  . Clindamycin/Lincomycin Other (See Comments)    Reaction:  Unknown   . Sulfamethoxazole-Trimethoprim Rash    Patient Measurements:    Vital Signs: Temp: 100.5 F (38.1 C) (10/11 1857) Temp Source: Oral (10/11 1643) BP: 154/80 mmHg (10/11 1857) Pulse Rate: 108 (10/11 1857) Intake/Output from previous day:   Intake/Output from this shift:    Labs:  Recent Labs  07/08/15 0937 07/08/15 1657  WBC 14.9* 16.3*  HGB 14.0 14.7  PLT 163 166  CREATININE 1.25* 1.18   Estimated Creatinine Clearance: 55.7 mL/min (by C-G formula based on Cr of 1.18). No results for input(s): VANCOTROUGH, VANCOPEAK, VANCORANDOM, GENTTROUGH, GENTPEAK, GENTRANDOM, TOBRATROUGH, TOBRAPEAK, TOBRARND, AMIKACINPEAK, AMIKACINTROU, AMIKACIN in the last 72 hours.   Microbiology: No results found for this or any previous visit (from the past 720 hour(s)).  Medical History: Past Medical History  Diagnosis Date  . Atrial fibrillation ([place])   . Kidney stones   . Gallstone   . BPH with obstruction/lower urinary tract symptoms   . Elevated PSA   . Testicular pain   . Urinary retention   . Aneurysm ([place])   . Gross hematuria   . Epididymitis   . Nocturia   . Cellulitis of scrotum   . Prostate neoplasm     Medications:  Anti-infectives    Start     Dose/Rate Route Frequency Ordered Stop   07/08/15 2130  piperacillin-tazobactam (ZOSYN) IVPB 3.375 g     3.375 g 12.5 mL/hr over 240 Minutes Intravenous Every 8 hours 07/08/15 2104       Assessment: 81 yo male presents to [facility] ED with abdominal pain, lower back pain, confusion, chills and rigors and was started on CTX for UTI. Pharmacy is consulted to dose zosyn for UTI and concern for cholecystitis. Pt is febrile to 100.5, WBC 16.3, sCr 1.18.   Goal of Therapy:  Resolution of infection  Plan:  Zosyn 3.375g IV q8h  Follow up culture results, renal  function and clinical course    Andrey Cota. Diona Foley, PharmD Clinical Pharmacist Pager [phone] 07/08/2015,9:05 PM

## 2015-07-08 NOTE — ED Notes (Signed)
Per daughter he is more confused this afternoon   Increased weakness  shakey

## 2015-07-08 NOTE — ED Notes (Signed)
Pharmacy called regarding rochephin.

## 2015-07-08 NOTE — Discharge Instructions (Signed)

## 2015-07-08 NOTE — Progress Notes (Signed)
ANTIBIOTIC CONSULT NOTE - INITIAL  Pharmacy Consult for Ceftriaxone Indication: UTI  Allergies  Allergen Reactions  . Clindamycin/Lincomycin   . Sulfamethoxazole-Trimethoprim Rash    Patient Measurements: Height: 6' (182.9 cm) Weight: 175 lb (79.379 kg) IBW/kg (Calculated) : 77.6  Vital Signs: Temp: 101.5 F (38.6 C) (10/11 1643) Temp Source: Oral (10/11 1643) BP: 124/78 mmHg (10/11 1643) Pulse Rate: 133 (10/11 1643) Intake/Output from previous day:   Intake/Output from this shift:    Labs:  Recent Labs  07/08/15 0937  WBC 14.9*  HGB 14.0  PLT 163  CREATININE 1.25*   Estimated Creatinine Clearance: 52.6 mL/min (by C-G formula based on Cr of 1.25). No results for input(s): VANCOTROUGH, VANCOPEAK, VANCORANDOM, GENTTROUGH, GENTPEAK, GENTRANDOM, TOBRATROUGH, TOBRAPEAK, TOBRARND, AMIKACINPEAK, AMIKACINTROU, AMIKACIN in the last 72 hours.   Microbiology: No results found for this or any previous visit (from the past 720 hour(s)).  Medical History: Past Medical History  Diagnosis Date  . Atrial fibrillation ([place])   . Kidney stones   . Gallstone   . BPH with obstruction/lower urinary tract symptoms   . Elevated PSA   . Testicular pain   . Urinary retention   . Aneurysm ([place])   . Gross hematuria   . Epididymitis   . Nocturia   . Cellulitis of scrotum   . Prostate neoplasm     Medications:  Anti-infectives    Start     Dose/Rate Route Frequency Ordered Stop   07/08/15 1700  cefTRIAXone (ROCEPHIN) 2 g in dextrose 5 % 50 mL IVPB     2 g 100 mL/hr over 30 Minutes Intravenous  Once 07/08/15 1653       Assessment: 81 yo male admitted for UTI.  Pharmacy consulted to dose ceftriaxone.   Goal of Therapy:  Resolution of infection  Plan:  Will start ceftriaxone 1g Q24H.   Follow up culture results   Pharmacy will continue to follow with you. Thank you for the consult.  Norma Fredrickson, PharmD Clinical Pharmacist  07/08/2015,4:55 PM

## 2015-07-08 NOTE — ED Notes (Addendum)
Report given to [facility] at this time, will arrive in 5 mins

## 2015-07-08 NOTE — ED Notes (Signed)
States he fell couple of days ago  Having pain to back,sides and abd.

## 2015-07-08 NOTE — ED Notes (Signed)
Report attempted to be called to [facility] [place] Room 3, RN unable to take report currently due to critical situation. This RN explained that [facility] is leaving at this time, RN verbalized understanding and stated RN will call back once able to give report. [facility] left at this time

## 2015-07-08 NOTE — ED Notes (Signed)
MD Forbach at bedside. 

## 2015-07-08 NOTE — ED Notes (Signed)
Patient transported to CT 

## 2015-07-08 NOTE — H&P (Signed)
PULMONARY / CRITICAL CARE MEDICINE   Name: Chris Simon MRN: [id number] DOB: 1934-01-06    ADMISSION DATE:  07/08/2015 CONSULTATION DATE:  07/08/2015  REFERRING MD :  [facility] ED  CHIEF COMPLAINT:  Abd pain, low back pain, confusion  INITIAL PRESENTATION: 81 y.o. M brought to [facility] ED 10/11 with abdominal pain, low back pain, confusion, chills/rigors. Found to have sepsis with cholecystitis cholelithiasis and choledocholithiasis.Marland Kitchen PCCM called for transfer to [facility].  STUDIES:  CT A/P 10/11 >>> cholelithiasis with intrahepatic and extrahepatic biliary ductal dilatation. There are multiple calculi within the distal common bile duct. Nonobstructing left renal stone. Diverticulosis without diverticulitis. Enlarged prostate with changes suggestive of bladder outlet obstruction. Irregular prostate that may represent an underlying mass lesion. Small left inguinal hernia.  SIGNIFICANT EVENTS: 10/11 - admit with sepsis.  HISTORY OF PRESENT ILLNESS:   Chris Simon is a 81 y.o. M with PMH as outlined below. He presented to [facility] ED 10/11 in AM with low back pain and abdominal pain after a fall 2 days prior. He was found to have UTI and was discharged with abx.  His daughter brought him back to [facility] ED later that same afternoon due to increasing confusion, weakness, and development of flushing with chills and rigors. He had also continued to report low back pain and abdominal pain. Per daughter, he normally does not suffer from any confusion / dementia at baseline.  Upon re-evaluation in ED, he was found to have fever to 102F with HR 133. CT of the abdomen showed cholelithiasis with biliary ductal dilatation as well as choledocholithiasis (additional findings as above).  Later that evening, he was transferred to [facility] for further evaluation and management.  PAST MEDICAL HISTORY :   has a past medical history of Atrial fibrillation ([place]); Kidney stones; Gallstone; BPH with  obstruction/lower urinary tract symptoms; Elevated PSA; Testicular pain; Urinary retention; Aneurysm ([place]); Gross hematuria; Epididymitis; Nocturia; Cellulitis of scrotum; and Prostate neoplasm.  has no past surgical history on file. Prior to Admission medications   Medication Sig Start Date End Date Taking? Authorizing Provider  amiodarone (PACERONE) 200 MG tablet Take 200 mg by mouth daily.    Historical Provider, MD  aspirin EC 325 MG tablet Take 325 mg by mouth daily.    Historical Provider, MD  finasteride (PROSCAR) 5 MG tablet Take 5 mg by mouth daily.    Historical Provider, MD  Multiple Vitamin (MULTIVITAMIN WITH MINERALS) TABS tablet Take 1 tablet by mouth daily.    Historical Provider, MD  nitrofurantoin, macrocrystal-monohydrate, (MACROBID) 100 MG capsule Take 1 capsule (100 mg total) by mouth 2 (two) times daily. 07/08/15 07/15/15  [name], MD  rosuvastatin (CRESTOR) 10 MG tablet Take 10 mg by mouth daily.     Historical Provider, MD  tamsulosin (FLOMAX) 0.4 MG CAPS capsule Take 0.4 mg by mouth daily.    Historical Provider, MD   Allergies  Allergen Reactions  . Clindamycin/Lincomycin Other (See Comments)    Reaction:  Unknown   . Sulfamethoxazole-Trimethoprim Rash    FAMILY HISTORY:  has no family status information on file.  SOCIAL HISTORY:  reports that he has quit smoking. He does not have any smokeless tobacco history on file. He reports that he does not drink alcohol.  REVIEW OF SYSTEMS:  Unable due to confusion  SUBJECTIVE:   VITAL SIGNS: Temp:  [98.2 F (36.8 C)-101.5 F (38.6 C)] 100.5 F (38.1 C) (10/11 1857) Pulse Rate:  [73-133] 108 (10/11 1857) Resp:  [16-31] 22 (  10/11 1857) BP: (124-162)/(57-81) 154/80 mmHg (10/11 1857) SpO2:  [94 %-100 %] 100 % (10/11 1857) Weight:  [79.379 kg (175 lb)] 79.379 kg (175 lb) (10/11 1643) HEMODYNAMICS:   VENTILATOR SETTINGS:   INTAKE / OUTPUT: No intake or output data in the 24 hours ending 07/08/15  2022  PHYSICAL EXAMINATION: General:  Elderly male agitated in bed Neuro:  Agitated, attempting to climb out of bed, easily re-directed. Non-focal HEENT:  [place]/AT, PERRL, no appreciable JVD Cardiovascular:  RRR, no MRG Lungs:  Clear bilateral breath sounds Abdomen:  Soft, non-tender, non-distended Musculoskeletal:  No acute deformity or ROM limitation Skin:  Grossly intact  LABS:  CBC  Recent Labs Lab 07/08/15 0937  WBC 14.9*  HGB 14.0  HCT 41.8  PLT 163   Coag's  Recent Labs Lab 07/08/15 1657  APTT 32  INR 1.13   BMET  Recent Labs Lab 07/08/15 0937  NA 138  K 3.8  CL 107  CO2 27  BUN 22*  CREATININE 1.25*  GLUCOSE 171*   Electrolytes  Recent Labs Lab 07/08/15 0937  CALCIUM 8.7*   Sepsis Markers  Recent Labs Lab 07/08/15 1657  LATICACIDVEN 3.2*   ABG No results for input(s): PHART, PCO2ART, PO2ART in the last 168 hours. Liver Enzymes  Recent Labs Lab 07/08/15 0937  AST 196*  ALT 98*  ALKPHOS 112  BILITOT 2.6*  ALBUMIN 3.8   Cardiac Enzymes  Recent Labs Lab 07/08/15 1657  TROPONINI <0.03   Glucose No results for input(s): GLUCAP in the last 168 hours.  Imaging Dg Lumbar Spine 2-3 Views  07/08/2015   CLINICAL DATA:  Acute low back pain after fall several days ago.  EXAM: LUMBAR SPINE - 2-3 VIEW  COMPARISON:  None.  FINDINGS: No fracture or spondylolisthesis is noted. Mild degenerative disc disease is noted at L1-2, L2-3 and L5-S1 with mild anterior osteophyte formation at these levels. Atherosclerosis of abdominal aorta is noted.  IMPRESSION: Mild multilevel degenerative disc disease is noted. No acute abnormality seen in the lumbar spine.   Electronically Signed   By: Marijo Conception, M.D.   On: 07/08/2015 08:13   Dg Chest Port 1 View  07/08/2015   CLINICAL DATA:  Fall 2 days ago with persistent back pain  EXAM: PORTABLE CHEST - 1 VIEW  COMPARISON:  06/21/2014  FINDINGS: Cardiac shadow is stable. The lungs are well aerated  bilaterally. No focal infiltrate, effusion or pneumothorax is seen. No bony abnormality is noted.  IMPRESSION: No active disease.   Electronically Signed   By: Inez Catalina M.D.   On: 07/08/2015 18:18   Ct Renal Stone Study  07/08/2015   CLINICAL DATA:  Left flank pain and hematuria  EXAM: CT ABDOMEN AND PELVIS WITHOUT CONTRAST  TECHNIQUE: Multidetector CT imaging of the abdomen and pelvis was performed following the standard protocol without IV contrast.  COMPARISON:  None.  FINDINGS: Lung bases are free of acute infiltrate or sizable effusion.  The liver is homogeneous in attenuation without focal mass. Central periportal edema is identified as well as some dilatation of the intra and extrahepatic biliary tree. Multiple gallstones are noted within a distended gallbladder. Additionally, there are multiple calculi in the distal aspect of the common bile duct causing the dilatation.  The spleen, adrenal glands and pancreas are otherwise within normal limits. The kidneys are well visualized bilaterally with nonobstructing renal calculus on the left measuring 4-5 mm. Additionally a cystic lesion is noted from the upper pole of the right  kidney.  Diffuse aortoiliac calcifications are noted. Mild dilatation to 3.4 cm is noted. The appendix is within normal limits. There are diverticular changes in the colon although no findings to suggest diverticulitis are seen. A small left inguinal hernia is noted containing a portion of the sigmoid colon although no obstructive changes are seen.  The prostate is significantly enlarged measuring 8.8 x 7.6 cm. On the left lateral aspect of the prostate it appears distorted and the possibility of an underlying mass cannot be totally excluded. No definitive pelvic adenopathy is seen. The bladder demonstrates some increased trabeculation likely related to bladder outlet syndrome.  IMPRESSION: Enlarged prostate with changes suggestive of bladder outlet obstruction. The prostate is  irregular particularly on its left lateral aspect which may represent an underlying mass lesion. Clinical correlation with the physical exam is recommended.  Small left inguinal hernia containing a loop of nonobstructed sigmoid colon.  Cholelithiasis as well as evidence of intrahepatic and extrahepatic biliary ductal dilatation. There are at multiple calculi within the distal common bile duct.  Nonobstructing left renal stone.  Diverticulosis without diverticulitis.   Electronically Signed   By: Inez Catalina M.D.   On: 07/08/2015 17:43      ASSESSMENT / PLAN:  INFECTIOUS A:  Urosepsis. Concern for underlying cholecystitis - CT noteable for cholelithiasis and choledocholithiasis. P:  BCx2 10/11 >>> UCx 10/11 >>> Abx: Zosyn, start date 10/11, day 1/x.  Trend PCT Follow WBC and fever curve PO Acetaminophen for fevers  CARDIOVASCULAR A:  A.fib with RVR - suspect exacerbated by sepsis. On chronic amiodarone and not on anticoagulation. Sepsis. HLD P:  Continue IVF resuscitation. Continue outpatient amiodarone, aspirin. Trend lactate. EKG Continue home amiodarone, ASA, crestor when taking PO  PULMONARY A: Tachypnea in setting sepsis P:  Supplemental O2 PRN  RENAL A:  AoCKD. Non-obstructing acute renal stone P:  NS @ 100. BMP in AM.  GASTROINTESTINAL A:  Cholelithiasis with choledocholithiasis Hyperbilirubinemia Transaminitis Small left inguinal hernia GI prophylaxis Nutrition P:  Consult general surgery upon arrival to [facility]. GI called by eMD - they will see in AM for possible ERCP. LFT's in AM. SUP: Pantoprazole. NPO.  HEMATOLOGIC A:  VTE Prophylaxis. P:  SCD's / Heparin. CBC in AM.  ENDOCRINE A:  Hyperglycemia - no hx DM.  P:  SSI. Check Hgb A1C.  NEUROLOGIC A:  Acute metabolic encephalopathy. P:  Monitor clinically. Limit sedating meds.  GENITOURINARY A: Enlarged prostate with irregularity along left lateral  aspect - concern for underlying mass lesion. Note, he has "prostate neoplasm" and "elevated PSA" listed under medical history. P: Consider checking PSA. May need further workup to evaluate for malignancy. Continue finasteride when can take PO  FAMILY  - Updates: Spoke with daughter at bedside 10/11  - Inter-disciplinary family meet or Palliative Care meeting due by:  10/18   Georgann Housekeeper, AGACNP-BC [place] [facility] Pager [phone] or [phone]  07/08/2015 8:48 PM

## 2015-07-08 NOTE — ED Provider Notes (Signed)
[facility] Emergency Department Provider Note  ____________________________________________  Time seen: On arrival  I have reviewed the triage vital signs and the nursing notes.   HISTORY  Chief Complaint Fall    HPI Chris Simon Chris Simon is a 81 y.o. male who presents with low back pain. Patient reports he fell 2 days ago while getting out of bed to go to the bathroom at night. Since then he has had lower back discomfort bilaterally and today it felt like it was traveling around to the sides. He denies weakness in the legs. No difficulty urinating. He denies abdominal pain to me. No numbness or tingling. No nausea or vomiting. No chest pain. no neck pain.     Past Medical History  Diagnosis Date  . Atrial fibrillation ([place])     There are no active problems to display for this patient.   History reviewed. No pertinent past surgical history.  Current Outpatient Rx  Name  Route  Sig  Dispense  Refill  . finasteride (PROSCAR) 5 MG tablet      TAKE (1) TABLET BY MOUTH EVERY DAY   90 tablet   3     Allergies Review of patient's allergies indicates no known allergies.  No family history on file.  Social History Social History  Substance Use Topics  . Smoking status: Former Research scientist (life sciences)  . Smokeless tobacco: None  . Alcohol Use: No    Review of Systems  Constitutional: Negative for dizziness Eyes: Negative for visual changes. ENT: Negative for sore throat Cardiovascular: Negative for chest pain. Respiratory: Negative for shortness of breath. Gastrointestinal: Negative for abdominal pain, vomiting and diarrhea. Genitourinary: Negative for dysuria. Musculoskeletal: Positive for low back pain Skin: Negative for rash. Neurological: Negative for headaches or focal weakness Psychiatric: No anxiety    ____________________________________________   PHYSICAL EXAM:  VITAL SIGNS: ED Triage Vitals  Enc Vitals Group     BP 07/08/15 0723 162/57  mmHg     Pulse Rate 07/08/15 0723 73     Resp 07/08/15 0723 18     Temp 07/08/15 0723 98.2 F (36.8 C)     Temp Source 07/08/15 0723 Oral     SpO2 07/08/15 0723 94 %     Weight 07/08/15 0723 175 lb (79.379 kg)     Height 07/08/15 0723 6' (1.829 m)     Head Cir --      Peak Flow --      Pain Score 07/08/15 0724 8     Pain Loc --      Pain Edu? --      Excl. in [place]? --      Constitutional: Alert and oriented. Well appearing and in no distress. Eyes: Conjunctivae are normal.  ENT   Head: Normocephalic and atraumatic.   Mouth/Throat: Mucous membranes are moist. Cardiovascular: Normal rate, regular rhythm. Normal and symmetric distal pulses are present in all extremities. No murmurs, rubs, or gallops. Respiratory: Normal respiratory effort without tachypnea nor retractions. Breath sounds are clear and equal bilaterally.  Gastrointestinal: Soft and non-tender in all quadrants. No distention. There is no CVA tenderness. Genitourinary: deferred Musculoskeletal: Nontender with normal range of motion in all extremities. No lower extremity tenderness nor edema. Vertebral tenderness to palpation. No paraspinal tenderness to palpation. Normal strength in the lower extremity is Neurologic:  Normal speech and language. No gross focal neurologic deficits are appreciated. Skin:  Skin is warm, dry and intact. No rash noted. Psychiatric: Mood and affect are normal. Patient  exhibits appropriate insight and judgment.  ____________________________________________    LABS (pertinent positives/negatives)  Labs Reviewed - No data to display  ____________________________________________   EKG  None  ____________________________________________    RADIOLOGY I have personally reviewed any xrays that were ordered on this patient: Lumbar x-ray negative  ____________________________________________   PROCEDURES  Procedure(s) performed: none  Critical Care performed:  none  ____________________________________________   INITIAL IMPRESSION / ASSESSMENT AND PLAN / ED COURSE  Pertinent labs & imaging results that were available during my care of the patient were reviewed by me and considered in my medical decision making (see chart for details).  Patient well-appearing and in no distress. Ambulating around the room easily. We will obtain x-rays of the lumbar spine as the patient did have a fall 2 days ago where he fell backwards.  Patient with mildly elevated white blood cell count and UA consistent with urinary tract infection. At likely explains his back discomfort. I will write a prescription for ciprofloxacin for 10 days  Daughter refused ciprofloxacin she states it does not work and recommended amoxicillin. I wrote a prescription for Macrobid instead  ____________________________________________   FINAL CLINICAL IMPRESSION(S) / ED DIAGNOSES  Final diagnoses:  UTI (lower urinary tract infection)     Chris Drafts, MD 07/08/15 1416

## 2015-07-08 NOTE — ED Provider Notes (Addendum)
[facility] Emergency Department Provider Note  ____________________________________________  Time seen: Approximately 4:56 PM  I have reviewed the triage vital signs and the nursing notes.   HISTORY  Chief Complaint Weakness  The patient is confused, consistent with delirium in the setting of fever/infection  HPI Chris Simon is a 81 y.o. male who was seen earlier today in this emergency department for low back pain and abdominal pain.  His vital signs were normal and he was afebrile at that time and he did not have any altered mental status.  He was accompanied by his daughter who is present this visit as well.  He was diagnosed with a urinary tract infection and discharged with a prescription for Macrobid, of which he has had one dose.  In the hours since he left the emergency department, his daughter reports that he has gotten confused, became very flushed, and was having shaking chills/rigors.  He is off of his baseline mental status; he typically does not have any confusion or dementia.  He has also continued to complain of generalized abdominal and bilateral flank pain.  His daughter mentioned that he had no tenderness to palpation during the prior ED visit, but he is complaining of more discomfort than he had previously.  She brought him back to the emergency department for reevaluation and he was found to be febrile at nearly 102 with a pulse of 133 and AMS consistent with acute delirium.  His symptoms are severe at this time and have worsening significantly in the last few hours. He has not had any nausea or vomiting.  Given his abnormal vital signs, the sepsis protocol was initiated shortly after his placement in the ED bed.    Past Medical History  Diagnosis Date  . Atrial fibrillation ([place])   . Kidney stones   . Gallstone   . BPH with obstruction/lower urinary tract symptoms   . Elevated PSA   . Testicular pain   . Urinary retention   .  Aneurysm ([place])   . Gross hematuria   . Epididymitis   . Nocturia   . Cellulitis of scrotum   . Prostate neoplasm     There are no active problems to display for this patient.   History reviewed. No pertinent past surgical history.  Current Outpatient Rx  Name  Route  Sig  Dispense  Refill  . amiodarone (PACERONE) 200 MG tablet   Oral   Take 200 mg by mouth daily.         Marland Kitchen aspirin EC 325 MG tablet   Oral   Take 325 mg by mouth daily.         . finasteride (PROSCAR) 5 MG tablet   Oral   Take 5 mg by mouth daily.         . Multiple Vitamin (MULTIVITAMIN WITH MINERALS) TABS tablet   Oral   Take 1 tablet by mouth daily.         . nitrofurantoin, macrocrystal-monohydrate, (MACROBID) 100 MG capsule   Oral   Take 1 capsule (100 mg total) by mouth 2 (two) times daily.   14 capsule   0   . rosuvastatin (CRESTOR) 10 MG tablet   Oral   Take 10 mg by mouth daily.          . tamsulosin (FLOMAX) 0.4 MG CAPS capsule   Oral   Take 0.4 mg by mouth daily.           Allergies Clindamycin/lincomycin  and Sulfamethoxazole-trimethoprim  Family History  Problem Relation Age of Onset  . Kidney disease Neg Hx   . Prostate cancer Neg Hx   . Bladder Cancer Neg Hx     Social History Social History  Substance Use Topics  . Smoking status: Former Research scientist (life sciences)  . Smokeless tobacco: None  . Alcohol Use: No    Review of Systems (Patient is acutely confused/delirious and the review of systems is coming from his daughter) Constitutional: fever and rigors Eyes: No visual changes. ENT: No sore throat. Cardiovascular: Denies chest pain. Respiratory: tachypnea and mild shortness of breath which has developed recently Gastrointestinal: generalized abdominal pain.  No nausea, no vomiting.  No diarrhea.  No constipation. Genitourinary: Negative for dysuria. +hematuria Musculoskeletal: bilateral flank pain Skin: Negative for rash. Neurological: Negative for headaches, focal  weakness or numbness.some difficulty with balance/ambulation.  Acute confusion/agitation  10-point ROS otherwise negative.  ____________________________________________   PHYSICAL EXAM:  VITAL SIGNS: ED Triage Vitals  Enc Vitals Group     BP 07/08/15 1643 124/78 mmHg     Pulse Rate 07/08/15 1643 133     Resp 07/08/15 1643 20     Temp 07/08/15 1643 101.5 F (38.6 C)     Temp Source 07/08/15 1643 Oral     SpO2 07/08/15 1643 94 %     Weight 07/08/15 1643 175 lb (79.379 kg)     Height 07/08/15 1643 6' (1.829 m)     Head Cir --      Peak Flow --      Pain Score --      Pain Loc --      Pain Edu? --      Excl. in [place]? --     Constitutional: Alert but confused and disoriented to place and time. Eyes: Conjunctivae are normal. PERRL. EOMI. Head: Atraumatic. Nose: No congestion/rhinnorhea. Mouth/Throat: Mucous membranes are moist.  Oropharynx non-erythematous. Neck: No stridor.   Cardiovascular: Normal rate, regular rhythm. Grossly normal heart sounds.  Good peripheral circulation. Respiratory: Normal respiratory effort.  No retractions. Lungs CTAB. Gastrointestinal: Soft but slightly distended.  +Murphy's sign with tenderness to palpation in general but specifically to RUQ. No abdominal bruits.  Musculoskeletal: No lower extremity tenderness nor edema.  No joint effusions. Neurologic:  Normal speech and language. No gross focal neurologic deficits are appreciated. Gait not specifically assessed given acute/critical illness. Skin:  Skin is warm, dry and intact. No rash noted.the patient is flushed/erythematous Psychiatric: Patient is acutely confused and disoriented, off his baseline.  ____________________________________________   LABS (all labs ordered are listed, but only abnormal results are displayed)  Labs Reviewed  LACTIC ACID, PLASMA - Abnormal; Notable for the following:    Lactic Acid, Venous 3.2 (*)    All other components within normal limits  COMPREHENSIVE  METABOLIC PANEL - Abnormal; Notable for the following:    CO2 20 (*)    Glucose, Bld 163 (*)    BUN 26 (*)    AST 166 (*)    ALT 123 (*)    Total Bilirubin 4.8 (*)    GFR calc non Af Amer 57 (*)    All other components within normal limits  CBC WITH DIFFERENTIAL/PLATELET - Abnormal; Notable for the following:    WBC 16.3 (*)    Neutro Abs 15.5 (*)    Lymphs Abs 0.4 (*)    All other components within normal limits  CULTURE, BLOOD (ROUTINE X 2)  CULTURE, BLOOD (ROUTINE X 2)  URINE CULTURE  TROPONIN I  APTT  PROTIME-INR  LIPASE, BLOOD   Labs from earlier today also reviewed and used in medical decision making. ____________________________________________  EKG  ED ECG REPORT I, Gloriajean Okun, the attending physician, personally viewed and interpreted this ECG.  Date: 07/08/2015 EKG Time: 17:12 Rate: 107 Rhythm: sinus tachycardia QRS Axis: normal Intervals: normal ST/T Wave abnormalities: Non-specific ST segment / T-wave changes, but no evidence of acute ischemia. Conduction Disutrbances: none Narrative Interpretation: unremarkable  ____________________________________________  RADIOLOGY  (from prior visit) Dg Lumbar Spine 2-3 Views  07/08/2015   CLINICAL DATA:  Acute low back pain after fall several days ago.  EXAM: LUMBAR SPINE - 2-3 VIEW  COMPARISON:  None.  FINDINGS: No fracture or spondylolisthesis is noted. Mild degenerative disc disease is noted at L1-2, L2-3 and L5-S1 with mild anterior osteophyte formation at these levels. Atherosclerosis of abdominal aorta is noted.  IMPRESSION: Mild multilevel degenerative disc disease is noted. No acute abnormality seen in the lumbar spine.   Electronically Signed   By: Marijo Conception, M.D.   On: 07/08/2015 08:13   Dg Chest Port 1 View  07/08/2015   CLINICAL DATA:  Fall 2 days ago with persistent back pain  EXAM: PORTABLE CHEST - 1 VIEW  COMPARISON:  06/21/2014  FINDINGS: Cardiac shadow is stable. The lungs are well aerated  bilaterally. No focal infiltrate, effusion or pneumothorax is seen. No bony abnormality is noted.  IMPRESSION: No active disease.   Electronically Signed   By: Inez Catalina M.D.   On: 07/08/2015 18:18   Ct Renal Stone Study  07/08/2015   CLINICAL DATA:  Left flank pain and hematuria  EXAM: CT ABDOMEN AND PELVIS WITHOUT CONTRAST  TECHNIQUE: Multidetector CT imaging of the abdomen and pelvis was performed following the standard protocol without IV contrast.  COMPARISON:  None.  FINDINGS: Lung bases are free of acute infiltrate or sizable effusion.  The liver is homogeneous in attenuation without focal mass. Central periportal edema is identified as well as some dilatation of the intra and extrahepatic biliary tree. Multiple gallstones are noted within a distended gallbladder. Additionally, there are multiple calculi in the distal aspect of the common bile duct causing the dilatation.  The spleen, adrenal glands and pancreas are otherwise within normal limits. The kidneys are well visualized bilaterally with nonobstructing renal calculus on the left measuring 4-5 mm. Additionally a cystic lesion is noted from the upper pole of the right kidney.  Diffuse aortoiliac calcifications are noted. Mild dilatation to 3.4 cm is noted. The appendix is within normal limits. There are diverticular changes in the colon although no findings to suggest diverticulitis are seen. A small left inguinal hernia is noted containing a portion of the sigmoid colon although no obstructive changes are seen.  The prostate is significantly enlarged measuring 8.8 x 7.6 cm. On the left lateral aspect of the prostate it appears distorted and the possibility of an underlying mass cannot be totally excluded. No definitive pelvic adenopathy is seen. The bladder demonstrates some increased trabeculation likely related to bladder outlet syndrome.  IMPRESSION: Enlarged prostate with changes suggestive of bladder outlet obstruction. The prostate is  irregular particularly on its left lateral aspect which may represent an underlying mass lesion. Clinical correlation with the physical exam is recommended.  Small left inguinal hernia containing a loop of nonobstructed sigmoid colon.  Cholelithiasis as well as evidence of intrahepatic and extrahepatic biliary ductal dilatation. There are at multiple calculi within the distal common bile duct.  Nonobstructing left renal stone.  Diverticulosis without diverticulitis.   Electronically Signed   By: Inez Catalina M.D.   On: 07/08/2015 17:43    ____________________________________________   PROCEDURES  Procedure(s) performed: None  Critical Care performed: Yes, see critical care note(s)  CRITICAL CARE Performed by: Hinda Kehr   Total critical care time: 60 minutes  Critical care time was exclusive of separately billable procedures and treating other patients.  Critical care was necessary to treat or prevent imminent or life-threatening deterioration.  Critical care was time spent personally by me on the following activities: development of treatment plan with patient and/or surrogate as well as nursing, discussions with consultants, evaluation of patient's response to treatment, examination of patient, obtaining history from patient or surrogate, ordering and performing treatments and interventions, ordering and review of laboratory studies, ordering and review of radiographic studies, pulse oximetry and re-evaluation of patient's condition.   ____________________________________________     INITIAL IMPRESSION / ASSESSMENT AND PLAN / ED COURSE  Pertinent labs & imaging results that were available during my care of the patient were reviewed by me and considered in my medical decision making (see chart for details).  The patient has had a significant status change since his visit earlier today and he currently meets sepsis criteria including altered mental status.  Given his abdominal  symptoms and an elevated T bili (which he has had in the past) as well as the gross hematuria, I will obtain a CT renal vertical study to look for signs of an infected stone and/or pyelonephritis, as well as to at least partially evaluate his biliary system.  I am proceeding with full sepsis workup including lactate, blood cultures, repeat labs, chest x-ray, etc.  I will also start with a goal of 30 mL/kg of normal saline and I am starting empiric antibiotics of ceftriaxone 2 g IV.  I discussed the plan with the patient's daughter who agrees.  ----------------------------------------- 6:12 PM on 07/08/2015 -----------------------------------------  The patient remains confused and tachycardic after a liter of fluids but continues to have a normal blood pressure.  His lactate is 3.2.  His CT scan abdomen and pelvis is concerning for prostate issues including a possible mass, the question of bladder outlet obstruction, and of more acute concern, cholelithiasis with intrahepatic and extrahepatic biliary ductal dilatation.  There are multiple calculi within the distal common bile duct.  In the setting of fever to approximately 102, altered mental status, leukocytosis, elevated total bilirubin, and the CT findings described above, I will treat him empirically for acute cholangitis.  He has been treated with 2 g of ceftriaxone IV and I will add Flagyl 500 mg and Zosyn 3.375g IV to the regimen.  We are continuing with 30 mL/kg of IV fluids.  There are no ICU beds available at [facility] today.  I spoke with the patient and his family and I have called [facility] for [facility] transfer for this patient who meets septic criteria.  ----------------------------------------- 6:23 PM on 07/08/2015 -----------------------------------------  I spoke by phone with Dr. Vaughan Browner, the ICU doctor at [facility].  We discussed the results of the CT scan including both the bladder outlet obstruction and the concern for  obstructing cholangitis and the need for emergent GI consult as well as the urinary tract infection and the sepsis criteria.  He agreed with my plan thus far and also agreed with giving a dose of Zosyn 3.375 g as well in addition to the ceftriaxone 2g IV already  received.  We are awaiting a bed assignment and we will transfer the patient as soon as possible.  I updated the patient and his family.  Dr. Vaughan Browner plans to speak with the GI consultant at [facility] as well given the probable need for emergent ERCP.  ----------------------------------------- 7:08 PM on 07/08/2015 -----------------------------------------  The patient is still alert but is increasingly altered, pulling at his wires, trying to walk.  I discussed delirium with the family, patient was reoriented, kept safe.   (Note that documentation was delayed due to multiple ED patients requiring immediate care.)  I evaluated the patient immediately prior to his transport.  He is increasingly confused but hemodynamically stable.  Transported by [facility].      ____________________________________________  FINAL CLINICAL IMPRESSION(S) / ED DIAGNOSES  Final diagnoses:  Complicated UTI (urinary tract infection)  Hematuria  Sepsis ([place])  Calculus of bile duct with acute cholangitis with obstruction    Hinda Kehr, MD 07/09/15 [phone]

## 2015-07-09 ENCOUNTER — Inpatient Hospital Stay (HOSPITAL_COMMUNITY): Payer: Medicare HMO | Admitting: Critical Care Medicine

## 2015-07-09 ENCOUNTER — Encounter (HOSPITAL_COMMUNITY): Payer: Self-pay | Admitting: Gastroenterology

## 2015-07-09 ENCOUNTER — Inpatient Hospital Stay (HOSPITAL_COMMUNITY): Payer: Medicare HMO

## 2015-07-09 ENCOUNTER — Encounter (HOSPITAL_COMMUNITY)
Admission: AD | Disposition: A | Discharge status: Home Health | Payer: Self-pay | Source: Other Acute Inpatient Hospital | Attending: Internal Medicine

## 2015-07-09 DIAGNOSIS — A419 Sepsis, unspecified organism: Secondary | ICD-10-CM

## 2015-07-09 DIAGNOSIS — N390 Urinary tract infection, site not specified: Secondary | ICD-10-CM | POA: Insufficient documentation

## 2015-07-09 DIAGNOSIS — I480 Paroxysmal atrial fibrillation: Secondary | ICD-10-CM | POA: Insufficient documentation

## 2015-07-09 DIAGNOSIS — N179 Acute kidney failure, unspecified: Secondary | ICD-10-CM | POA: Insufficient documentation

## 2015-07-09 DIAGNOSIS — K8050 Calculus of bile duct without cholangitis or cholecystitis without obstruction: Secondary | ICD-10-CM

## 2015-07-09 HISTORY — PX: ERCP: SHX5425

## 2015-07-09 LAB — HEPATIC FUNCTION PANEL
ALBUMIN: 2.5 g/dL — AB (ref 3.5–5.0)
ALK PHOS: 74 U/L (ref 38–126)
ALT: 83 U/L — ABNORMAL HIGH (ref 17–63)
AST: 90 U/L — ABNORMAL HIGH (ref 15–41)
BILIRUBIN DIRECT: 2.6 mg/dL — AB (ref 0.1–0.5)
BILIRUBIN INDIRECT: 1.9 mg/dL — AB (ref 0.3–0.9)
BILIRUBIN TOTAL: 4.5 mg/dL — AB (ref 0.3–1.2)
Total Protein: 5.5 g/dL — ABNORMAL LOW (ref 6.5–8.1)

## 2015-07-09 LAB — GLUCOSE, CAPILLARY
GLUCOSE-CAPILLARY: 102 mg/dL — AB (ref 65–99)
Glucose-Capillary: 97 mg/dL (ref 65–99)

## 2015-07-09 LAB — BASIC METABOLIC PANEL
ANION GAP: 13 (ref 5–15)
BUN: 23 mg/dL — AB (ref 6–20)
CALCIUM: 8.3 mg/dL — AB (ref 8.9–10.3)
CO2: 21 mmol/L — ABNORMAL LOW (ref 22–32)
Chloride: 105 mmol/L (ref 101–111)
Creatinine, Ser: 1.32 mg/dL — ABNORMAL HIGH (ref 0.61–1.24)
GFR calc Af Amer: 58 mL/min — ABNORMAL LOW (ref >60)
GFR, EST NON AFRICAN AMERICAN: 50 mL/min — AB (ref >60)
Glucose, Bld: 122 mg/dL — ABNORMAL HIGH (ref 65–99)
POTASSIUM: 3.3 mmol/L — AB (ref 3.5–5.1)
SODIUM: 139 mmol/L (ref 135–145)

## 2015-07-09 LAB — CBC
HCT: 38.7 % — ABNORMAL LOW (ref 39.0–52.0)
Hemoglobin: 12.7 g/dL — ABNORMAL LOW (ref 13.0–17.0)
MCH: 31.1 pg (ref 26.0–34.0)
MCHC: 32.8 g/dL (ref 30.0–36.0)
MCV: 94.6 fL (ref 78.0–100.0)
PLATELETS: 115 10*3/uL — AB (ref 150–400)
RBC: 4.09 MIL/uL — AB (ref 4.22–5.81)
RDW: 13.6 % (ref 11.5–15.5)
WBC: 25.9 10*3/uL — AB (ref 4.0–10.5)

## 2015-07-09 LAB — MAGNESIUM: MAGNESIUM: 1.6 mg/dL — AB (ref 1.7–2.4)

## 2015-07-09 LAB — PROCALCITONIN: Procalcitonin: 10.46 ng/mL

## 2015-07-09 LAB — PHOSPHORUS: Phosphorus: 3.3 mg/dL (ref 2.5–4.6)

## 2015-07-09 SURGERY — ERCP, WITH INTERVENTION IF INDICATED
Anesthesia: General

## 2015-07-09 MED ORDER — POTASSIUM CHLORIDE 10 MEQ/100ML IV SOLN
10.0000 meq | INTRAVENOUS | Status: AC
  Administered 2015-07-09 (×2): 10 meq via INTRAVENOUS
  Filled 2015-07-09 (×2): qty 100

## 2015-07-09 MED ORDER — IOHEXOL 350 MG/ML SOLN
INTRAVENOUS | Status: DC | PRN
  Administered 2015-07-09: 50 mL

## 2015-07-09 MED ORDER — PHENYLEPHRINE HCL 10 MG/ML IJ SOLN
10.0000 mg | INTRAVENOUS | Status: DC | PRN
  Administered 2015-07-09: 20 ug/min via INTRAVENOUS

## 2015-07-09 MED ORDER — PHENYLEPHRINE HCL 10 MG/ML IJ SOLN
INTRAMUSCULAR | Status: DC | PRN
  Administered 2015-07-09 (×3): 80 ug via INTRAVENOUS

## 2015-07-09 MED ORDER — LACTATED RINGERS IV SOLN: INTRAVENOUS | Status: DC

## 2015-07-09 MED ORDER — SODIUM CHLORIDE 0.9 % IV SOLN: INTRAVENOUS | Status: DC

## 2015-07-09 MED ORDER — FENTANYL CITRATE (PF) 250 MCG/5ML IJ SOLN
INTRAMUSCULAR | Status: DC | PRN
  Administered 2015-07-09 (×2): 50 ug via INTRAVENOUS

## 2015-07-09 MED ORDER — ONDANSETRON HCL 4 MG/2ML IJ SOLN
INTRAMUSCULAR | Status: DC | PRN
  Administered 2015-07-09: 4 mg via INTRAVENOUS

## 2015-07-09 MED ORDER — PROPOFOL 10 MG/ML IV BOLUS
INTRAVENOUS | Status: DC | PRN
  Administered 2015-07-09: 120 mg via INTRAVENOUS

## 2015-07-09 MED ORDER — LACTATED RINGERS IV SOLN
INTRAVENOUS | Status: DC | PRN
  Administered 2015-07-09: 11:00:00 via INTRAVENOUS

## 2015-07-09 MED ORDER — SUCCINYLCHOLINE CHLORIDE 20 MG/ML IJ SOLN
INTRAMUSCULAR | Status: DC | PRN
  Administered 2015-07-09: 100 mg via INTRAVENOUS

## 2015-07-09 NOTE — Anesthesia Preprocedure Evaluation (Addendum)
Anesthesia Evaluation  Patient identified by MRN, date of birth, ID band Patient awake    Reviewed: Allergy & Precautions, NPO status , Patient's Chart, lab work & pertinent test results  History of Anesthesia Complications Negative for: history of anesthetic complications  Airway Mallampati: II  TM Distance: >3 FB Neck ROM: Full    Dental  (+) Dental Advisory Given, Missing   Pulmonary former smoker,    breath sounds clear to auscultation       Cardiovascular (-) angina+ dysrhythmias Atrial Fibrillation  Rhythm:Regular Rate:Normal     Neuro/Psych negative neurological ROS     GI/Hepatic Elevated LFTs Acute cholitis   Endo/Other  negative endocrine ROS  Renal/GU Renal InsufficiencyRenal disease (creat 1.32)     Musculoskeletal   Abdominal   Peds  Hematology   Anesthesia Other Findings Prostate cancer  Reproductive/Obstetrics                          Anesthesia Physical Anesthesia Plan  ASA: III  Anesthesia Plan: General   Post-op Pain Management:    Induction: Intravenous  Airway Management Planned: Oral ETT  Additional Equipment:   Intra-op Plan:   Post-operative Plan: Extubation in OR  Informed Consent: I have reviewed the patients History and Physical, chart, labs and discussed the procedure including the risks, benefits and alternatives for the proposed anesthesia with the patient or authorized representative who has indicated his/her understanding and acceptance.   Dental advisory given  Plan Discussed with: CRNA and Surgeon  Anesthesia Plan Comments: (Plan routine monitors, GETA)        Anesthesia Quick Evaluation

## 2015-07-09 NOTE — Progress Notes (Signed)
[facility] P[facility]M  Patient seen and examined this morning, see my attestation to [facility] note.  Roselie Awkward, MD [place] PCCM Pager: [phone] Cell: [phone] After 3pm or if no response, call [phone]

## 2015-07-09 NOTE — Op Note (Signed)
[facility] [place], [address]   ERCP PROCEDURE REPORT  PATIENT: Chris Simon, Chris Simon  MR# :[id number] BIRTHDATE: Jun 15, 1934  GENDER: male ENDOSCOPIST: Clarene Essex, MD REFERRED BY: PROCEDURE DATE:  07/09/2015 PROCEDURE:   ERCP with sphincterotomy/papillotomy and ERCP with removal of calculus/calculi ASA CLASS:    3 INDICATIONS: CBD stone probable cholangitis MEDICATIONS:    general anesthesia TOPICAL ANESTHETIC:  none  DESCRIPTION OF PROCEDURE:   After the risks benefits and alternatives of the procedure were thoroughly explained, informed consent was obtained.  The ercp pentax [id number]  endoscope was introduced through the mouth and advanced to the second portion of the duodenum .there was some debris in the stomach and a periampullary diverticulum but no other obvious abnormalities were seen and using the triple lumen sphincterotome loaded with the JAG Jagwire deep selective cannulation was obtained and there was not obvious pancreatic duct wire advancement or dye injection into the pancreas and on initial cholangiogram multiple moderately large stones were seen throughout the CBD and there was no cystic duct filling throughout the procedure and we went ahead and proceed with a moderately large sphincterotomy in the customary fashion until we could get the fully bowed sphincterotome easily in and out of the duct and we did have some adequate biliary drainage and then we exchanged the sphincterotome for the 12-15 mm adjustable balloon catheter and proceeded with multiple balloon prefers with multiple large stones and debris and sludge removed but no frank cholangitis just dirty bile and we proceeded with an occlusion cholangiogram which seemed to have residual stones in the opposite upper duct so the wire was withdrawn and then successfully advanced up that direct and balloon pull-through of the duct removed multiple other stones and then we  proceeded with an occlusion cholangiogram in the customary fashion again which looked much better and multiple balloon pull-through was done without resistance and there was adequate biliary drainage and the wire and the catheter were removed and the procedure was terminated and the patient tolerated the procedure well there was no obvious immediate complication Estimated blood loss is zero unless otherwise noted in this procedure report.       COMPLICATIONS: none  ENDOSCOPIC IMPRESSION:1. Old food in the stomach 2. Small periampullary diverticulum 3. Multiple large CBD and intrahepatic stones status post removed as above with sphincterotomy and balloon catheter 4. No obvious pancreatic duct injection or wire advancement 5. No cystic duct filling  RECOMMENDATIONS:observe for delayed complications follow labs and hopefully proceed with laparoscopic cholecystectomy tomorrow with intraoperative cholangiogram just to be sure although expect lots of air which will make stone identification difficult     _______________________________ eSigned:  Clarene Essex, MD 07/09/2015 12:28 PM   CC:

## 2015-07-09 NOTE — Progress Notes (Addendum)
Patient ID: Chris Simon, male   DOB: 06-17-1934, 81 y.o.   MRN: [id number] Needs ercp today given findings. Will need lap chole after duct is cleared and better.

## 2015-07-09 NOTE — ED Notes (Signed)
Lab called to report positive blood culture bottles for Gram negative rods. Both bottles in the first set are positive and the anaerobic bottle of the 2nd set is also positive for gram negative rods. Called and reported same to ICU nurse at [facility].

## 2015-07-09 NOTE — Progress Notes (Signed)
CRITICAL VALUE ALERT  Critical value received:  2.6   Date of notification:  07/08/15  Time of notification:  2245  Critical value read back: Yes   Nurse who received alert:  Polly Cobia   MD notified (1st page): MD Nelda Marseille   Time of first page: 0000 MD notified (2nd page):  Time of second page:  Responding MD: MD Nelda Marseille   Time MD responded:  0000

## 2015-07-09 NOTE — Transfer of Care (Signed)
Immediate Anesthesia Transfer of Care Note  Patient: Chris Simon  Procedure(s) Performed: Procedure(s): ENDOSCOPIC RETROGRADE CHOLANGIOPANCREATOGRAPHY (ERCP) (N/A)  Patient Location: Endoscopy Unit  Anesthesia Type:General  Level of Consciousness: awake and alert   Airway & Oxygen Therapy: Patient Spontanous Breathing and Patient connected to nasal cannula oxygen  Post-op Assessment: Report given to RN, Post -op Vital signs reviewed and stable and Patient moving all extremities X 4  Post vital signs: Reviewed and stable  Last Vitals:  Filed Vitals:   07/09/15 1107  BP: 132/60  Pulse: 80  Temp: 36.8 C  Resp: 25    Complications: No apparent anesthesia complications

## 2015-07-09 NOTE — Consult Note (Signed)
Reason for Consult:gallstones Referring Physician: Dr. Keturah Barre. Chris Simon is an 81 y.o. male.  HPI: This gentleman presents transferred from [facility].  He presented there with a one-day history of back pain, chills, rigor, and confusion.  He was also found to be hypotensive.  He has a known history of gallstones.  He has had a CT scan with renal protocol showing a dilated gallbladder with periportal edema, dilated intra-and extra hepatic bile ducts, gallstones, and distal common bile duct stones. He was also found to have elevated liver function tests. Currently, he is still slightly confused. His family is at bedside. He reports minimal abdominal discomfort at this point. He denied any vomiting. Bowel movements have been normal.  Past Medical History  Diagnosis Date  . Atrial fibrillation ([place])   . Kidney stones   . Gallstone   . BPH with obstruction/lower urinary tract symptoms   . Elevated PSA   . Testicular pain   . Urinary retention   . Aneurysm ([place])   . Gross hematuria   . Epididymitis   . Nocturia   . Cellulitis of scrotum   . Prostate neoplasm     No past surgical history on file.  Family History  Problem Relation Age of Onset  . Kidney disease Neg Hx   . Prostate cancer Neg Hx   . Bladder Cancer Neg Hx     Social History:  reports that he has quit smoking. He does not have any smokeless tobacco history on file. He reports that he does not drink alcohol. His drug history is not on file.  Allergies:  Allergies  Allergen Reactions  . Clindamycin/Lincomycin Other (See Comments)    Reaction:  Unknown   . Sulfamethoxazole-Trimethoprim Rash    Medications: I have reviewed the patient's current medications.  Results for orders placed or performed during the hospital encounter of 07/08/15 (from the past 48 hour(s))  MRSA PCR Screening     Status: None   Collection Time: 07/08/15  8:26 PM  Result Value Ref Range   MRSA by PCR NEGATIVE NEGATIVE    Comment:         The GeneXpert MRSA Assay (FDA approved for NASAL specimens only), is one component of a comprehensive MRSA colonization surveillance program. It is not intended to diagnose MRSA infection nor to guide or monitor treatment for MRSA infections.   I-STAT 3, arterial blood gas (G3+)     Status: Abnormal   Collection Time: 07/08/15  9:41 PM  Result Value Ref Range   pH, Arterial 7.376 7.350 - 7.450   pCO2 arterial 34.0 (L) 35.0 - 45.0 mmHg   pO2, Arterial 76.0 (L) 80.0 - 100.0 mmHg   Bicarbonate 19.6 (L) 20.0 - 24.0 mEq/L   TCO2 21 0 - 100 mmol/L   O2 Saturation 94.0 %   Acid-base deficit 4.0 (H) 0.0 - 2.0 mmol/L   Patient temperature 101.5 F    Collection site BRACHIAL ARTERY    Drawn by Operator    Sample type ARTERIAL   Magnesium     Status: Abnormal   Collection Time: 07/08/15  9:48 PM  Result Value Ref Range   Magnesium 1.4 (L) 1.7 - 2.4 mg/dL  Phosphorus     Status: Abnormal   Collection Time: 07/08/15  9:48 PM  Result Value Ref Range   Phosphorus 1.5 (L) 2.5 - 4.6 mg/dL  Amylase     Status: None   Collection Time: 07/08/15  9:48 PM  Result Value  Ref Range   Amylase 34 28 - 100 U/L  Lipase, blood     Status: None   Collection Time: 07/08/15  9:48 PM  Result Value Ref Range   Lipase 22 22 - 51 U/L  Lactic acid, plasma     Status: Abnormal   Collection Time: 07/08/15  9:48 PM  Result Value Ref Range   Lactic Acid, Venous 2.6 (HH) 0.5 - 2.0 mmol/L    Comment: CRITICAL RESULT CALLED TO, READ BACK BY AND VERIFIED WITH: SMITH,C RN 07/08/2015 2245 JORDANS   Procalcitonin     Status: None   Collection Time: 07/08/15  9:48 PM  Result Value Ref Range   Procalcitonin 10.46 ng/mL    Comment:        Interpretation: PCT >= 10 ng/mL: Important systemic inflammatory response, almost exclusively due to severe bacterial sepsis or septic shock. (NOTE)         ICU PCT Algorithm               Non ICU PCT Algorithm    ----------------------------      ------------------------------         PCT < 0.25 ng/mL                 PCT < 0.1 ng/mL     Stopping of antibiotics            Stopping of antibiotics       strongly encouraged.               strongly encouraged.    ----------------------------     ------------------------------       PCT level decrease by               PCT < 0.25 ng/mL       >= 80% from peak PCT       OR PCT 0.25 - 0.5 ng/mL          Stopping of antibiotics                                             encouraged.     Stopping of antibiotics           encouraged.    ----------------------------     ------------------------------       PCT level decrease by              PCT >= 0.25 ng/mL       < 80% from peak PCT        AND PCT >= 0.5 ng/mL             Continuing antibiotics                                              encouraged.       Continuing antibiotics            encouraged.    ----------------------------     ------------------------------     PCT level increase compared          PCT > 0.5 ng/mL         with peak PCT AND          PCT >= 0.5 ng/mL  Escalation of antibiotics                                          strongly encouraged.      Escalation of antibiotics        strongly encouraged.   CBC WITH DIFFERENTIAL     Status: Abnormal   Collection Time: 07/08/15  9:48 PM  Result Value Ref Range   WBC 15.6 (H) 4.0 - 10.5 K/uL   RBC 4.33 4.22 - 5.81 MIL/uL   Hemoglobin 13.5 13.0 - 17.0 g/dL   HCT 40.8 39.0 - 52.0 %   MCV 94.2 78.0 - 100.0 fL   MCH 31.2 26.0 - 34.0 pg   MCHC 33.1 30.0 - 36.0 g/dL   RDW 13.4 11.5 - 15.5 %   Platelets 121 (L) 150 - 400 K/uL   Neutrophils Relative % 92 %   Neutro Abs 14.4 (H) 1.7 - 7.7 K/uL   Lymphocytes Relative 2 %   Lymphs Abs 0.3 (L) 0.7 - 4.0 K/uL   Monocytes Relative 6 %   Monocytes Absolute 0.9 0.1 - 1.0 K/uL   Eosinophils Relative 0 %   Eosinophils Absolute 0.0 0.0 - 0.7 K/uL   Basophils Relative 0 %   Basophils Absolute 0.0 0.0 - 0.1 K/uL   Protime-INR     Status: Abnormal   Collection Time: 07/08/15  9:48 PM  Result Value Ref Range   Prothrombin Time 17.4 (H) 11.6 - 15.2 seconds   INR 1.42 0.00 - 6.96  Basic metabolic panel     Status: Abnormal   Collection Time: 07/08/15  9:48 PM  Result Value Ref Range   Sodium 134 (L) 135 - 145 mmol/L   Potassium 3.3 (L) 3.5 - 5.1 mmol/L   Chloride 102 101 - 111 mmol/L   CO2 21 (L) 22 - 32 mmol/L   Glucose, Bld 139 (H) 65 - 99 mg/dL   BUN 21 (H) 6 - 20 mg/dL   Creatinine, Ser 1.27 (H) 0.61 - 1.24 mg/dL   Calcium 8.0 (L) 8.9 - 10.3 mg/dL   GFR calc non Af Amer 52 (L) >60 mL/min   GFR calc Af Amer >60 >60 mL/min    Comment: (NOTE) The eGFR has been calculated using the CKD EPI equation. This calculation has not been validated in all clinical situations. eGFR's persistently <60 mL/min signify possible Chronic Kidney Disease.    Anion gap 11 5 - 15  CBC     Status: Abnormal (Preliminary result)   Collection Time: 07/09/15  2:14 AM  Result Value Ref Range   WBC 25.9 (H) 4.0 - 10.5 K/uL   RBC 4.09 (L) 4.22 - 5.81 MIL/uL   Hemoglobin 12.7 (L) 13.0 - 17.0 g/dL   HCT 38.7 (L) 39.0 - 52.0 %   MCV 94.6 78.0 - 100.0 fL   MCH 31.1 26.0 - 34.0 pg   MCHC 32.8 30.0 - 36.0 g/dL   RDW 13.6 11.5 - 15.5 %   Platelets PENDING 150 - 400 K/uL  Basic metabolic panel     Status: Abnormal   Collection Time: 07/09/15  2:14 AM  Result Value Ref Range   Sodium 139 135 - 145 mmol/L   Potassium 3.3 (L) 3.5 - 5.1 mmol/L   Chloride 105 101 - 111 mmol/L   CO2 21 (L) 22 - 32 mmol/L   Glucose, Bld 122 (H) 65 -  99 mg/dL   BUN 23 (H) 6 - 20 mg/dL   Creatinine, Ser 1.32 (H) 0.61 - 1.24 mg/dL   Calcium 8.3 (L) 8.9 - 10.3 mg/dL   GFR calc non Af Amer 50 (L) >60 mL/min   GFR calc Af Amer 58 (L) >60 mL/min    Comment: (NOTE) The eGFR has been calculated using the CKD EPI equation. This calculation has not been validated in all clinical situations. eGFR's persistently <60 mL/min signify possible Chronic  Kidney Disease.    Anion gap 13 5 - 15  Magnesium     Status: Abnormal   Collection Time: 07/09/15  2:14 AM  Result Value Ref Range   Magnesium 1.6 (L) 1.7 - 2.4 mg/dL  Phosphorus     Status: None   Collection Time: 07/09/15  2:14 AM  Result Value Ref Range   Phosphorus 3.3 2.5 - 4.6 mg/dL  Hepatic function panel     Status: Abnormal   Collection Time: 07/09/15  2:14 AM  Result Value Ref Range   Total Protein 5.5 (L) 6.5 - 8.1 g/dL   Albumin 2.5 (L) 3.5 - 5.0 g/dL   AST 90 (H) 15 - 41 U/L   ALT 83 (H) 17 - 63 U/L   Alkaline Phosphatase 74 38 - 126 U/L   Total Bilirubin 4.5 (H) 0.3 - 1.2 mg/dL   Bilirubin, Direct 2.6 (H) 0.1 - 0.5 mg/dL   Indirect Bilirubin 1.9 (H) 0.3 - 0.9 mg/dL    Dg Lumbar Spine 2-3 Views  07/08/2015  CLINICAL DATA:  Acute low back pain after fall several days ago. EXAM: LUMBAR SPINE - 2-3 VIEW COMPARISON:  None. FINDINGS: No fracture or spondylolisthesis is noted. Mild degenerative disc disease is noted at L1-2, L2-3 and L5-S1 with mild anterior osteophyte formation at these levels. Atherosclerosis of abdominal aorta is noted. IMPRESSION: Mild multilevel degenerative disc disease is noted. No acute abnormality seen in the lumbar spine. Electronically Signed   By: Marijo Conception, M.D.   On: 07/08/2015 08:13   Dg Chest Port 1 View  07/08/2015  CLINICAL DATA:  Fall 2 days ago with persistent back pain EXAM: PORTABLE CHEST - 1 VIEW COMPARISON:  06/21/2014 FINDINGS: Cardiac shadow is stable. The lungs are well aerated bilaterally. No focal infiltrate, effusion or pneumothorax is seen. No bony abnormality is noted. IMPRESSION: No active disease. Electronically Signed   By: Inez Catalina M.D.   On: 07/08/2015 18:18   Ct Renal Stone Study  07/08/2015  CLINICAL DATA:  Left flank pain and hematuria EXAM: CT ABDOMEN AND PELVIS WITHOUT CONTRAST TECHNIQUE: Multidetector CT imaging of the abdomen and pelvis was performed following the standard protocol without IV contrast.  COMPARISON:  None. FINDINGS: Lung bases are free of acute infiltrate or sizable effusion. The liver is homogeneous in attenuation without focal mass. Central periportal edema is identified as well as some dilatation of the intra and extrahepatic biliary tree. Multiple gallstones are noted within a distended gallbladder. Additionally, there are multiple calculi in the distal aspect of the common bile duct causing the dilatation. The spleen, adrenal glands and pancreas are otherwise within normal limits. The kidneys are well visualized bilaterally with nonobstructing renal calculus on the left measuring 4-5 mm. Additionally a cystic lesion is noted from the upper pole of the right kidney. Diffuse aortoiliac calcifications are noted. Mild dilatation to 3.4 cm is noted. The appendix is within normal limits. There are diverticular changes in the colon although no findings to suggest diverticulitis  are seen. A small left inguinal hernia is noted containing a portion of the sigmoid colon although no obstructive changes are seen. The prostate is significantly enlarged measuring 8.8 x 7.6 cm. On the left lateral aspect of the prostate it appears distorted and the possibility of an underlying mass cannot be totally excluded. No definitive pelvic adenopathy is seen. The bladder demonstrates some increased trabeculation likely related to bladder outlet syndrome. IMPRESSION: Enlarged prostate with changes suggestive of bladder outlet obstruction. The prostate is irregular particularly on its left lateral aspect which may represent an underlying mass lesion. Clinical correlation with the physical exam is recommended. Small left inguinal hernia containing a loop of nonobstructed sigmoid colon. Cholelithiasis as well as evidence of intrahepatic and extrahepatic biliary ductal dilatation. There are at multiple calculi within the distal common bile duct. Nonobstructing left renal stone. Diverticulosis without diverticulitis.  Electronically Signed   By: Inez Catalina M.D.   On: 07/08/2015 17:43    Review of Systems  All other systems reviewed and are negative.  Blood pressure 94/52, pulse 82, temperature 98.4 F (36.9 C), temperature source Oral, resp. rate 17, weight 84.7 kg (186 lb 11.7 oz), SpO2 96 %. Physical Exam  Constitutional: He appears well-developed and well-nourished. No distress.  HENT:  Head: Normocephalic and atraumatic.  Right Ear: External ear normal.  Left Ear: External ear normal.  Mouth/Throat: No oropharyngeal exudate.  Eyes: Conjunctivae are normal. Pupils are equal, round, and reactive to light. Scleral icterus is present.  Neck: Normal range of motion. No tracheal deviation present.  Cardiovascular: Normal rate, regular rhythm and normal heart sounds.   No murmur heard. Respiratory: Effort normal and breath sounds normal. No respiratory distress.  GI: Soft. He exhibits no distension. There is tenderness.  There is mild tenderness in the right upper quadrant with some minimal guarding. There is an easily reducible left inguinal hernia. There is a well-healed midline incision  Musculoskeletal: Normal range of motion. He exhibits no edema or tenderness.  Neurological: He is alert.  Mildly confused  Skin: Skin is warm. He is not diaphoretic.  Slight jaundice  Psychiatric: His behavior is normal.    Assessment/Plan: Patient with cholelithiasis and choledocholithiasis with possible cholecystitis and cholangitis  GI consultation is pending. Given the elevated liver function tests, CT findings, and possible cholangitis, I would recommend ERCP to decompress the bile duct. This would then be followed by a laparoscopic cholecystectomy when the patient is clinically stable. I discussed this with the patient and his family who understands. We will follow him closely.  Nichalos Brenton A 07/09/2015, 6:10 AM

## 2015-07-09 NOTE — Progress Notes (Signed)
CRITICAL VALUE ALERT  Critical value received:  Gram Negative Rods in Anaerobic and Aerobic bottles   Date of notification:  07/09/15  Time of notification:  0530  Critical value read back: Yes   Nurse who received alert:  Polly Cobia   MD notified (1st page):  MD [facility]   Time of first page:  0630  MD notified (2nd page):  Time of second page:  Responding MD:  MD Lake Bells   Time MD responded:  [id number]

## 2015-07-09 NOTE — Anesthesia Procedure Notes (Signed)
Procedure Name: Intubation Date/Time: 07/09/2015 11:28 AM Performed by: Merrilyn Puma B Pre-anesthesia Checklist: Patient identified, Emergency Drugs available, Suction available, Patient being monitored and Timeout performed Patient Re-evaluated:Patient Re-evaluated prior to inductionOxygen Delivery Method: Circle system utilized Preoxygenation: Pre-oxygenation with 100% oxygen Intubation Type: IV induction Ventilation: Mask ventilation without difficulty Laryngoscope Size: Mac and 4 Grade View: Grade II Tube type: Oral Tube size: 7.5 mm Number of attempts: 1 Airway Equipment and Method: Stylet Placement Confirmation: positive ETCO2,  CO2 detector,  breath sounds checked- equal and bilateral and ETT inserted through vocal cords under direct vision Secured at: 22 cm Tube secured with: Tape Dental Injury: Teeth and Oropharynx as per pre-operative assessment

## 2015-07-09 NOTE — Progress Notes (Signed)
[facility] ADULT ICU REPLACEMENT PROTOCOL FOR AM LAB REPLACEMENT ONLY  The patient does not apply for the [facility] Adult ICU Electrolyte Replacment Protocol based on the criteria listed below:    Abnormal electrolyte(s):K3.3)   If a panic level lab has been reported, has the CCM MD in charge been notified? Yes.  .   Physician:  Claretta Fraise 07/09/2015 5:43 AM

## 2015-07-09 NOTE — Consult Note (Signed)
Reason for Consult: Probable cholangitis Referring Physician: ICU team  Chris Simon is an 81 y.o. male.  HPI: Patient with gallstones and CBD stones with increased abdominal pain nausea vomiting and fever and other than a bout of diverticulitis years ago he has not had any GI issues and he was seen by the surgical team who is recommended an ERCP and his hospital computer chart was reviewed and his case was discussed with the patient and his daughter and is actually feeling better today on antibiotics and has no new complaints  Past Medical History  Diagnosis Date  . Atrial fibrillation ([place])   . Kidney stones   . Gallstone   . BPH with obstruction/lower urinary tract symptoms   . Elevated PSA   . Testicular pain   . Urinary retention   . Aneurysm ([place])   . Gross hematuria   . Epididymitis   . Nocturia   . Cellulitis of scrotum   . Prostate neoplasm     History reviewed. No pertinent past surgical history.  Family History  Problem Relation Age of Onset  . Kidney disease Neg Hx   . Prostate cancer Neg Hx   . Bladder Cancer Neg Hx     Social History:  reports that he has quit smoking. He does not have any smokeless tobacco history on file. He reports that he does not drink alcohol. His drug history is not on file.  Allergies:  Allergies  Allergen Reactions  . Clindamycin/Lincomycin Other (See Comments)    Reaction:  Unknown   . Sulfamethoxazole-Trimethoprim Rash    Medications: I have reviewed the patient's current medications.  Results for orders placed or performed during the hospital encounter of 07/08/15 (from the past 48 hour(s))  MRSA PCR Screening     Status: None   Collection Time: 07/08/15  8:26 PM  Result Value Ref Range   MRSA by PCR NEGATIVE NEGATIVE    Comment:        The GeneXpert MRSA Assay (FDA approved for NASAL specimens only), is one component of a comprehensive MRSA colonization surveillance program. It is not intended to diagnose  MRSA infection nor to guide or monitor treatment for MRSA infections.   I-STAT 3, arterial blood gas (G3+)     Status: Abnormal   Collection Time: 07/08/15  9:41 PM  Result Value Ref Range   pH, Arterial 7.376 7.350 - 7.450   pCO2 arterial 34.0 (L) 35.0 - 45.0 mmHg   pO2, Arterial 76.0 (L) 80.0 - 100.0 mmHg   Bicarbonate 19.6 (L) 20.0 - 24.0 mEq/L   TCO2 21 0 - 100 mmol/L   O2 Saturation 94.0 %   Acid-base deficit 4.0 (H) 0.0 - 2.0 mmol/L   Patient temperature 101.5 F    Collection site BRACHIAL ARTERY    Drawn by Operator    Sample type ARTERIAL   Magnesium     Status: Abnormal   Collection Time: 07/08/15  9:48 PM  Result Value Ref Range   Magnesium 1.4 (L) 1.7 - 2.4 mg/dL  Phosphorus     Status: Abnormal   Collection Time: 07/08/15  9:48 PM  Result Value Ref Range   Phosphorus 1.5 (L) 2.5 - 4.6 mg/dL  Amylase     Status: None   Collection Time: 07/08/15  9:48 PM  Result Value Ref Range   Amylase 34 28 - 100 U/L  Lipase, blood     Status: None   Collection Time: 07/08/15  9:48 PM  Result  Value Ref Range   Lipase 22 22 - 51 U/L  Lactic acid, plasma     Status: Abnormal   Collection Time: 07/08/15  9:48 PM  Result Value Ref Range   Lactic Acid, Venous 2.6 (HH) 0.5 - 2.0 mmol/L    Comment: CRITICAL RESULT CALLED TO, READ BACK BY AND VERIFIED WITH: SMITH,C RN 07/08/2015 2245 JORDANS   Procalcitonin     Status: None   Collection Time: 07/08/15  9:48 PM  Result Value Ref Range   Procalcitonin 10.46 ng/mL    Comment:        Interpretation: PCT >= 10 ng/mL: Important systemic inflammatory response, almost exclusively due to severe bacterial sepsis or septic shock. (NOTE)         ICU PCT Algorithm               Non ICU PCT Algorithm    ----------------------------     ------------------------------         PCT < 0.25 ng/mL                 PCT < 0.1 ng/mL     Stopping of antibiotics            Stopping of antibiotics       strongly encouraged.               strongly  encouraged.    ----------------------------     ------------------------------       PCT level decrease by               PCT < 0.25 ng/mL       >= 80% from peak PCT       OR PCT 0.25 - 0.5 ng/mL          Stopping of antibiotics                                             encouraged.     Stopping of antibiotics           encouraged.    ----------------------------     ------------------------------       PCT level decrease by              PCT >= 0.25 ng/mL       < 80% from peak PCT        AND PCT >= 0.5 ng/mL             Continuing antibiotics                                              encouraged.       Continuing antibiotics            encouraged.    ----------------------------     ------------------------------     PCT level increase compared          PCT > 0.5 ng/mL         with peak PCT AND          PCT >= 0.5 ng/mL             Escalation of antibiotics  strongly encouraged.      Escalation of antibiotics        strongly encouraged.   CBC WITH DIFFERENTIAL     Status: Abnormal   Collection Time: 07/08/15  9:48 PM  Result Value Ref Range   WBC 15.6 (H) 4.0 - 10.5 K/uL   RBC 4.33 4.22 - 5.81 MIL/uL   Hemoglobin 13.5 13.0 - 17.0 g/dL   HCT 40.8 39.0 - 52.0 %   MCV 94.2 78.0 - 100.0 fL   MCH 31.2 26.0 - 34.0 pg   MCHC 33.1 30.0 - 36.0 g/dL   RDW 13.4 11.5 - 15.5 %   Platelets 121 (L) 150 - 400 K/uL   Neutrophils Relative % 92 %   Neutro Abs 14.4 (H) 1.7 - 7.7 K/uL   Lymphocytes Relative 2 %   Lymphs Abs 0.3 (L) 0.7 - 4.0 K/uL   Monocytes Relative 6 %   Monocytes Absolute 0.9 0.1 - 1.0 K/uL   Eosinophils Relative 0 %   Eosinophils Absolute 0.0 0.0 - 0.7 K/uL   Basophils Relative 0 %   Basophils Absolute 0.0 0.0 - 0.1 K/uL  Protime-INR     Status: Abnormal   Collection Time: 07/08/15  9:48 PM  Result Value Ref Range   Prothrombin Time 17.4 (H) 11.6 - 15.2 seconds   INR 1.42 0.00 - 0.93  Basic metabolic panel     Status:  Abnormal   Collection Time: 07/08/15  9:48 PM  Result Value Ref Range   Sodium 134 (L) 135 - 145 mmol/L   Potassium 3.3 (L) 3.5 - 5.1 mmol/L   Chloride 102 101 - 111 mmol/L   CO2 21 (L) 22 - 32 mmol/L   Glucose, Bld 139 (H) 65 - 99 mg/dL   BUN 21 (H) 6 - 20 mg/dL   Creatinine, Ser 1.27 (H) 0.61 - 1.24 mg/dL   Calcium 8.0 (L) 8.9 - 10.3 mg/dL   GFR calc non Af Amer 52 (L) >60 mL/min   GFR calc Af Amer >60 >60 mL/min    Comment: (NOTE) The eGFR has been calculated using the CKD EPI equation. This calculation has not been validated in all clinical situations. eGFR's persistently <60 mL/min signify possible Chronic Kidney Disease.    Anion gap 11 5 - 15  Urine culture     Status: None (Preliminary result)   Collection Time: 07/08/15 10:56 PM  Result Value Ref Range   Specimen Description URINE, CLEAN CATCH    Special Requests NONE    Culture NO GROWTH < 12 HOURS    Report Status PENDING   CBC     Status: Abnormal   Collection Time: 07/09/15  2:14 AM  Result Value Ref Range   WBC 25.9 (H) 4.0 - 10.5 K/uL   RBC 4.09 (L) 4.22 - 5.81 MIL/uL   Hemoglobin 12.7 (L) 13.0 - 17.0 g/dL   HCT 38.7 (L) 39.0 - 52.0 %   MCV 94.6 78.0 - 100.0 fL   MCH 31.1 26.0 - 34.0 pg   MCHC 32.8 30.0 - 36.0 g/dL   RDW 13.6 11.5 - 15.5 %   Platelets 115 (L) 150 - 400 K/uL    Comment: SPECIMEN CHECKED FOR CLOTS REPEATED TO VERIFY PLATELET COUNT CONFIRMED BY SMEAR   Basic metabolic panel     Status: Abnormal   Collection Time: 07/09/15  2:14 AM  Result Value Ref Range   Sodium 139 135 - 145 mmol/L   Potassium 3.3 (L) 3.5 - 5.1 mmol/L  Chloride 105 101 - 111 mmol/L   CO2 21 (L) 22 - 32 mmol/L   Glucose, Bld 122 (H) 65 - 99 mg/dL   BUN 23 (H) 6 - 20 mg/dL   Creatinine, Ser 1.32 (H) 0.61 - 1.24 mg/dL   Calcium 8.3 (L) 8.9 - 10.3 mg/dL   GFR calc non Af Amer 50 (L) >60 mL/min   GFR calc Af Amer 58 (L) >60 mL/min    Comment: (NOTE) The eGFR has been calculated using the CKD EPI equation. This  calculation has not been validated in all clinical situations. eGFR's persistently <60 mL/min signify possible Chronic Kidney Disease.    Anion gap 13 5 - 15  Magnesium     Status: Abnormal   Collection Time: 07/09/15  2:14 AM  Result Value Ref Range   Magnesium 1.6 (L) 1.7 - 2.4 mg/dL  Phosphorus     Status: None   Collection Time: 07/09/15  2:14 AM  Result Value Ref Range   Phosphorus 3.3 2.5 - 4.6 mg/dL  Hepatic function panel     Status: Abnormal   Collection Time: 07/09/15  2:14 AM  Result Value Ref Range   Total Protein 5.5 (L) 6.5 - 8.1 g/dL   Albumin 2.5 (L) 3.5 - 5.0 g/dL   AST 90 (H) 15 - 41 U/L   ALT 83 (H) 17 - 63 U/L   Alkaline Phosphatase 74 38 - 126 U/L   Total Bilirubin 4.5 (H) 0.3 - 1.2 mg/dL   Bilirubin, Direct 2.6 (H) 0.1 - 0.5 mg/dL   Indirect Bilirubin 1.9 (H) 0.3 - 0.9 mg/dL  Glucose, capillary     Status: Abnormal   Collection Time: 07/09/15  7:43 AM  Result Value Ref Range   Glucose-Capillary 102 (H) 65 - 99 mg/dL    Dg Lumbar Spine 2-3 Views  07/08/2015  CLINICAL DATA:  Acute low back pain after fall several days ago. EXAM: LUMBAR SPINE - 2-3 VIEW COMPARISON:  None. FINDINGS: No fracture or spondylolisthesis is noted. Mild degenerative disc disease is noted at L1-2, L2-3 and L5-S1 with mild anterior osteophyte formation at these levels. Atherosclerosis of abdominal aorta is noted. IMPRESSION: Mild multilevel degenerative disc disease is noted. No acute abnormality seen in the lumbar spine. Electronically Signed   By: Marijo Conception, M.D.   On: 07/08/2015 08:13   Dg Chest Port 1 View  07/08/2015  CLINICAL DATA:  Fall 2 days ago with persistent back pain EXAM: PORTABLE CHEST - 1 VIEW COMPARISON:  06/21/2014 FINDINGS: Cardiac shadow is stable. The lungs are well aerated bilaterally. No focal infiltrate, effusion or pneumothorax is seen. No bony abnormality is noted. IMPRESSION: No active disease. Electronically Signed   By: Inez Catalina M.D.   On: 07/08/2015  18:18   Ct Renal Stone Study  07/08/2015  CLINICAL DATA:  Left flank pain and hematuria EXAM: CT ABDOMEN AND PELVIS WITHOUT CONTRAST TECHNIQUE: Multidetector CT imaging of the abdomen and pelvis was performed following the standard protocol without IV contrast. COMPARISON:  None. FINDINGS: Lung bases are free of acute infiltrate or sizable effusion. The liver is homogeneous in attenuation without focal mass. Central periportal edema is identified as well as some dilatation of the intra and extrahepatic biliary tree. Multiple gallstones are noted within a distended gallbladder. Additionally, there are multiple calculi in the distal aspect of the common bile duct causing the dilatation. The spleen, adrenal glands and pancreas are otherwise within normal limits. The kidneys are well visualized bilaterally with  nonobstructing renal calculus on the left measuring 4-5 mm. Additionally a cystic lesion is noted from the upper pole of the right kidney. Diffuse aortoiliac calcifications are noted. Mild dilatation to 3.4 cm is noted. The appendix is within normal limits. There are diverticular changes in the colon although no findings to suggest diverticulitis are seen. A small left inguinal hernia is noted containing a portion of the sigmoid colon although no obstructive changes are seen. The prostate is significantly enlarged measuring 8.8 x 7.6 cm. On the left lateral aspect of the prostate it appears distorted and the possibility of an underlying mass cannot be totally excluded. No definitive pelvic adenopathy is seen. The bladder demonstrates some increased trabeculation likely related to bladder outlet syndrome. IMPRESSION: Enlarged prostate with changes suggestive of bladder outlet obstruction. The prostate is irregular particularly on its left lateral aspect which may represent an underlying mass lesion. Clinical correlation with the physical exam is recommended. Small left inguinal hernia containing a loop of  nonobstructed sigmoid colon. Cholelithiasis as well as evidence of intrahepatic and extrahepatic biliary ductal dilatation. There are at multiple calculi within the distal common bile duct. Nonobstructing left renal stone. Diverticulosis without diverticulitis. Electronically Signed   By: Inez Catalina M.D.   On: 07/08/2015 17:43    Review of Systems  Cardiovascular: Orthopnea: and other than a bout of diverticulitis years ago.   Blood pressure 107/57, pulse 78, temperature 98 F (36.7 C), temperature source Oral, resp. rate 24, weight 86.7 kg (191 lb 2.2 oz), SpO2 97 %. Physical Exam Vital signs stable afebrile no acute distress he looks better this a.m.than his story exam please see preassessment evaluation abdomen is soft nontender currently with an occasional bowel sound no significant pedal edema labs and CT reviewed Assessment/Plan: Gallstones and CBD stones worrisome for cholangitis Plan: The risks benefits methods and success rate of ERCP stone extraction sphincterotomy and possible stenting was discussed with the patient and his daughter and will proceed ASAP this morning with further workup and plans pending those findings  Sachi Boulay E 07/09/2015, 9:25 AM

## 2015-07-09 NOTE — Progress Notes (Signed)
Sepsis - Repeat Assessment  Performed at:    0200  Vitals     Blood pressure 96/45, pulse 99, resp. rate 23, weight 84.7 kg (186 lb 11.7 oz), SpO2 96 %.  Heart:     Regular rate and rhythm  Lungs:    CTA  Capillary Refill:   <2 sec  Peripheral Pulse:   Radial pulse palpable  Skin:     Normal Color   Lactic acid clearing 3.2 > 2.6  Georgann Housekeeper, AGACNP-BC [facility] Pulmonology/Critical Care Pager [phone] or [phone]  07/09/2015 2:45 AM

## 2015-07-09 NOTE — Anesthesia Postprocedure Evaluation (Signed)
  Anesthesia Post-op Note  Patient: Chris Simon  Procedure(s) Performed: Procedure(s): ENDOSCOPIC RETROGRADE CHOLANGIOPANCREATOGRAPHY (ERCP) (N/A)  Patient Location: PACU  Anesthesia Type:General  Level of Consciousness: awake, alert  and patient cooperative  Airway and Oxygen Therapy: Patient Spontanous Breathing and Patient connected to nasal cannula oxygen  Post-op Pain: none  Post-op Assessment: Post-op Vital signs reviewed, Patient's Cardiovascular Status Stable, Respiratory Function Stable, Patent Airway, No signs of Nausea or vomiting and Pain level controlled              Post-op Vital Signs: Reviewed and stable  Last Vitals:  Filed Vitals:   07/09/15 1304  BP: 111/52  Pulse:   Temp:   Resp: 15    Complications: No apparent anesthesia complications

## 2015-07-10 ENCOUNTER — Encounter (HOSPITAL_COMMUNITY): Payer: Self-pay | Admitting: Gastroenterology

## 2015-07-10 DIAGNOSIS — K8030 Calculus of bile duct with cholangitis, unspecified, without obstruction: Secondary | ICD-10-CM

## 2015-07-10 LAB — URINE CULTURE: CULTURE: NO GROWTH

## 2015-07-10 LAB — CBC WITH DIFFERENTIAL/PLATELET
BASOS ABS: 0 10*3/uL (ref 0.0–0.1)
Basophils Relative: 0 %
EOS PCT: 0 %
Eosinophils Absolute: 0 10*3/uL (ref 0.0–0.7)
HCT: 37.2 % — ABNORMAL LOW (ref 39.0–52.0)
HEMOGLOBIN: 12.3 g/dL — AB (ref 13.0–17.0)
LYMPHS ABS: 0.8 10*3/uL (ref 0.7–4.0)
LYMPHS PCT: 4 %
MCH: 31.9 pg (ref 26.0–34.0)
MCHC: 33.1 g/dL (ref 30.0–36.0)
MCV: 96.4 fL (ref 78.0–100.0)
Monocytes Absolute: 0.8 10*3/uL (ref 0.1–1.0)
Monocytes Relative: 5 %
NEUTROS PCT: 91 %
Neutro Abs: 15.7 10*3/uL — ABNORMAL HIGH (ref 1.7–7.7)
PLATELETS: 111 10*3/uL — AB (ref 150–400)
RBC: 3.86 MIL/uL — AB (ref 4.22–5.81)
RDW: 14.4 % (ref 11.5–15.5)
WBC: 17.3 10*3/uL — AB (ref 4.0–10.5)

## 2015-07-10 LAB — COMPREHENSIVE METABOLIC PANEL
ALK PHOS: 82 U/L (ref 38–126)
ALT: 56 U/L (ref 17–63)
AST: 44 U/L — AB (ref 15–41)
Albumin: 2.3 g/dL — ABNORMAL LOW (ref 3.5–5.0)
Anion gap: 9 (ref 5–15)
BUN: 17 mg/dL (ref 6–20)
CALCIUM: 8 mg/dL — AB (ref 8.9–10.3)
CHLORIDE: 107 mmol/L (ref 101–111)
CO2: 26 mmol/L (ref 22–32)
CREATININE: 1.09 mg/dL (ref 0.61–1.24)
GFR calc Af Amer: >60 mL/min (ref >60)
Glucose, Bld: 89 mg/dL (ref 65–99)
Potassium: 3.6 mmol/L (ref 3.5–5.1)
SODIUM: 142 mmol/L (ref 135–145)
Total Bilirubin: 2.1 mg/dL — ABNORMAL HIGH (ref 0.3–1.2)
Total Protein: 5.3 g/dL — ABNORMAL LOW (ref 6.5–8.1)

## 2015-07-10 MED ORDER — ZOLPIDEM TARTRATE 5 MG PO TABS
5.0000 mg | ORAL_TABLET | Freq: Every evening | ORAL | Status: DC | PRN
  Administered 2015-07-10: 5 mg via ORAL
  Filled 2015-07-10: qty 1

## 2015-07-10 MED ORDER — TAMSULOSIN HCL 0.4 MG PO CAPS
0.4000 mg | ORAL_CAPSULE | Freq: Every day | ORAL | Status: DC
  Administered 2015-07-10 – 2015-07-14 (×5): 0.4 mg via ORAL
  Filled 2015-07-10 (×5): qty 1

## 2015-07-10 MED ORDER — AMIODARONE HCL 200 MG PO TABS
200.0000 mg | ORAL_TABLET | Freq: Every day | ORAL | Status: DC
  Administered 2015-07-10 – 2015-07-14 (×5): 200 mg via ORAL
  Filled 2015-07-10 (×5): qty 1

## 2015-07-10 MED ORDER — FENTANYL CITRATE (PF) 100 MCG/2ML IJ SOLN
50.0000 ug | Freq: Three times a day (TID) | INTRAMUSCULAR | Status: DC | PRN
  Administered 2015-07-10: 50 ug via INTRAVENOUS
  Filled 2015-07-10: qty 2

## 2015-07-10 MED ORDER — FINASTERIDE 5 MG PO TABS
5.0000 mg | ORAL_TABLET | Freq: Every day | ORAL | Status: DC
  Administered 2015-07-10 – 2015-07-14 (×5): 5 mg via ORAL
  Filled 2015-07-10 (×5): qty 1

## 2015-07-10 NOTE — Progress Notes (Signed)
1 Day Post-Op  Subjective: Confused overnight, mostly oriented this am  Objective: Vital signs in last 24 hours: Temp:  [96.8 F (36 C)-98.7 F (37.1 C)] 98.7 F (37.1 C) (10/13 0610) Pulse Rate:  [73-94] 87 (10/13 0610) Resp:  [13-25] 18 (10/13 0610) BP: (99-142)/(47-71) 142/70 mmHg (10/13 0610) SpO2:  [95 %-100 %] 95 % (10/13 0610) Weight:  [86.4 kg (190 lb 7.6 oz)-86.6 kg (190 lb 14.7 oz)] 86.6 kg (190 lb 14.7 oz) (10/13 0500) Last BM Date: 07/07/15  Intake/Output from previous day: 10/12 0701 - 10/13 0700 In: 2698.6 [I.V.:2398.6; IV Piggyback:300] Out: 2450 [Urine:2450] Intake/Output this shift:    GI: soft nt/nd  Lab Results:   Recent Labs  07/09/15 0214 07/10/15 0640  WBC 25.9* 17.3*  HGB 12.7* 12.3*  HCT 38.7* 37.2*  PLT 115* 111*   BMET  Recent Labs  07/09/15 0214 07/10/15 0640  NA 139 142  K 3.3* 3.6  CL 105 107  CO2 21* 26  GLUCOSE 122* 89  BUN 23* 17  CREATININE 1.32* 1.09  CALCIUM 8.3* 8.0*   PT/INR  Recent Labs  07/08/15 1657 07/08/15 2148  LABPROT 14.7 17.4*  INR 1.13 1.42   ABG  Recent Labs  07/08/15 2141  PHART 7.376  HCO3 19.6*    Studies/Results: Dg Chest Port 1 View  07/08/2015  CLINICAL DATA:  Fall 2 days ago with persistent back pain EXAM: PORTABLE CHEST - 1 VIEW COMPARISON:  06/21/2014 FINDINGS: Cardiac shadow is stable. The lungs are well aerated bilaterally. No focal infiltrate, effusion or pneumothorax is seen. No bony abnormality is noted. IMPRESSION: No active disease. Electronically Signed   By: Inez Catalina M.D.   On: 07/08/2015 18:18   Dg Ercp Biliary & Pancreatic Ducts  07/09/2015  CLINICAL DATA:  Cholelithiasis EXAM: ERCP TECHNIQUE: Multiple spot images obtained with the fluoroscopic device and submitted for interpretation post-procedure. FLUOROSCOPY TIME:  5 min 48 seconds COMPARISON:  CT 07/08/2015 FINDINGS: Three intraoperative fluoroscopic spot images document endoscopic cannulation and opacification of  the CBD. Multiple filling defects in the mid and distal CBD. Incomplete opacification of the intrahepatic bile ducts, which appear dilated centrally. Final image of the series Demonstrates passage of a balloon catheter through the CBD. IMPRESSION: 1. Choledocholithiasis with endoscopic intervention These images were submitted for radiologic interpretation only. Please see the procedural report for the amount of contrast and the fluoroscopy time utilized. Electronically Signed   By: Lucrezia Europe M.D.   On: 07/09/2015 13:37   Ct Renal Stone Study  07/08/2015  CLINICAL DATA:  Left flank pain and hematuria EXAM: CT ABDOMEN AND PELVIS WITHOUT CONTRAST TECHNIQUE: Multidetector CT imaging of the abdomen and pelvis was performed following the standard protocol without IV contrast. COMPARISON:  None. FINDINGS: Lung bases are free of acute infiltrate or sizable effusion. The liver is homogeneous in attenuation without focal mass. Central periportal edema is identified as well as some dilatation of the intra and extrahepatic biliary tree. Multiple gallstones are noted within a distended gallbladder. Additionally, there are multiple calculi in the distal aspect of the common bile duct causing the dilatation. The spleen, adrenal glands and pancreas are otherwise within normal limits. The kidneys are well visualized bilaterally with nonobstructing renal calculus on the left measuring 4-5 mm. Additionally a cystic lesion is noted from the upper pole of the right kidney. Diffuse aortoiliac calcifications are noted. Mild dilatation to 3.4 cm is noted. The appendix is within normal limits. There are diverticular changes in the colon  although no findings to suggest diverticulitis are seen. A small left inguinal hernia is noted containing a portion of the sigmoid colon although no obstructive changes are seen. The prostate is significantly enlarged measuring 8.8 x 7.6 cm. On the left lateral aspect of the prostate it appears  distorted and the possibility of an underlying mass cannot be totally excluded. No definitive pelvic adenopathy is seen. The bladder demonstrates some increased trabeculation likely related to bladder outlet syndrome. IMPRESSION: Enlarged prostate with changes suggestive of bladder outlet obstruction. The prostate is irregular particularly on its left lateral aspect which may represent an underlying mass lesion. Clinical correlation with the physical exam is recommended. Small left inguinal hernia containing a loop of nonobstructed sigmoid colon. Cholelithiasis as well as evidence of intrahepatic and extrahepatic biliary ductal dilatation. There are at multiple calculi within the distal common bile duct. Nonobstructing left renal stone. Diverticulosis without diverticulitis. Electronically Signed   By: Inez Catalina M.D.   On: 07/08/2015 17:43    Anti-infectives: Anti-infectives    Start     Dose/Rate Route Frequency Ordered Stop   07/08/15 2130  piperacillin-tazobactam (ZOSYN) IVPB 3.375 g     3.375 g 12.5 mL/hr over 240 Minutes Intravenous Every 8 hours 07/08/15 2104        Assessment/Plan: S/p ercp for cholangitis Needs cholecystectomy  I discussed with he and his daughter this am the need for cholecystectomy. We discussed lap chole and attendant risks.  I think his current confusion likely combination of resolving sepsis as well as GA yesterday.  I told them will plan on doing in am tomorrow as I think he will do better overall with another anesthetic and surgery for his sepsis to continue resolving. Will cont abx today, allow him to eat until mn   [facility] 07/10/2015

## 2015-07-10 NOTE — Progress Notes (Addendum)
[facility] PROGRESS NOTE  Chris Simon XHB:[id number] DOB: [birth year] DOA: 07/08/2015 PCP: [name], MD  Length of stay: 2   Assessment/Plan: Principal Problem:   Sepsis ([place]) Active Problems:   Acute kidney injury ([place])   Choledocholithiasis   Paroxysmal atrial fibrillation ([place])   UTI (lower urinary tract infection)    UTI with sepsis/acute cholecystitis Continue Zosyn laparoscopic cholecystectomy tomorrow   Acute metabolic encephalopathy due to sepsis, minimize sedation continue to treat underlying infection  Severe sepsis due to UTI/cholangitis, improving with IV antibiotics  Acute kidney injury improving with IV fluids, continue IV fluids  Hematuria likely secondary to Foley trauma, continue IV hydration hopefully this will clear up  Atrial fibrillation with rapid ventricular response, restart amiodarone, hold aspirin, not on anticoagulation  Nonobstructing renal stone/enlarged prostrate with bladder outlet obstruction-continue Foley catheter will need outpatient urology examination, resume Flomax/finasteride and blood pressure tolerates  Hyperglycemia-continue SSI, hemoglobin A1c pending  Dyslipidemia-hold Crestor  DVT prophylaxsis heparin  Code Status:      Code Status Orders        Start     Ordered   07/08/15 2051  Full code   Continuous     07/08/15 2059     Family Communication: family updated about patient's clinical progress Disposition Plan:  As above    Brief narrative: 81 y.o. male.  HPI: This gentleman presents transferred from [facility]. He presented there with a one-day history of back pain, chills, rigor, and confusion. He was also found to be hypotensive. He has a known history of gallstones. He has had a CT scan with renal protocol showing a dilated gallbladder with periportal edema, dilated intra-and extra hepatic bile ducts, gallstones, and distal common bile duct stones. He was also found to have elevated  liver function tests. Currently, he is still slightly confused. His family is at bedside. He reports minimal abdominal discomfort at this point. He denied any vomiting. Bowel movements have been normal.  Consultants:  General surgery  Gastroenterology    Procedures: ERCP-Multiple large CBD and intrahepatic stones status post removed as above with sphincterotomy and balloon catheter 4. No obvious pancreatic duct injection or wire  advancement 5. No cystic duct filling  Antibiotics: Anti-infectives    Start     Dose/Rate Route Frequency Ordered Stop   07/08/15 2130  piperacillin-tazobactam (ZOSYN) IVPB 3.375 g     3.375 g 12.5 mL/hr over 240 Minutes Intravenous Every 8 hours 07/08/15 2104           HPI/Subjective: Patient is alert and oriented, tried to pull out Foley catheter last night and had some hematuria this morning  Objective: Filed Vitals:   07/09/15 2032 07/10/15 0500 07/10/15 0610 07/10/15 1000  BP: 123/67  142/70 124/59  Pulse: 90  87 72  Temp: 98.3 F (36.8 C)  98.7 F (37.1 C) 98.2 F (36.8 C)  TempSrc: Oral  Oral Oral  Resp: 17  18 18   Height:      Weight: 86.4 kg (190 lb 7.6 oz) 86.6 kg (190 lb 14.7 oz)    SpO2: 96%  95% 98%    Intake/Output Summary (Last 24 hours) at 07/10/15 1246 Last data filed at 07/10/15 1214  Gross per 24 hour  Intake 1418.63 ml  Output   3025 ml  Net -1606.37 ml    Exam: General: Elderly male agitated in bed Neuro: Agitated, attempting to climb out of bed, easily re-directed. Non-focal HEENT: /AT, PERRL, no appreciable  JVD Cardiovascular: RRR, no MRG Lungs: Clear bilateral breath sounds Abdomen: Soft, non-tender, non-distended Musculoskeletal: No acute deformity or ROM limitation Skin: Grossly intact  Data Review   Micro Results Recent Results (from the past 240 hour(s))  Blood Culture (routine x 2)     Status: None (Preliminary result)   Collection Time: 07/08/15  4:51 PM  Result Value Ref Range  Status   Specimen Description BLOOD RIGHT ARM  Final   Special Requests BOTTLES DRAWN AEROBIC AND ANAEROBIC 6CC  Final   Culture  Setup Time   Final    GRAM NEGATIVE RODS IN BOTH AEROBIC AND ANAEROBIC BOTTLES CRITICAL RESULT CALLED TO, READ BACK BY AND VERIFIED WITH: LEA FERGUSON @0426  07/09/15.Marland KitchenMarland KitchenAJO CONFIRMED BY DV    Culture   Final    GRAM NEGATIVE RODS IDENTIFICATION AND SUSCEPTIBILITIES TO FOLLOW    Report Status PENDING  Incomplete  Blood Culture (routine x 2)     Status: None (Preliminary result)   Collection Time: 07/08/15  4:56 PM  Result Value Ref Range Status   Specimen Description BLOOD RIGHT HAND  Final   Special Requests BOTTLES DRAWN AEROBIC AND ANAEROBIC 6CC  Final   Culture  Setup Time   Final    GRAM NEGATIVE RODS IN BOTH AEROBIC AND ANAEROBIC BOTTLES CRITICAL RESULT CALLED TO, READ BACK BY AND VERIFIED WITH: LEA FERGUSON @0426  07/09/15.Marland KitchenMarland KitchenAJO    Culture NO GROWTH 2 DAYS  Final   Report Status PENDING  Incomplete  Urine culture     Status: None   Collection Time: 07/08/15  4:57 PM  Result Value Ref Range Status   Specimen Description URINE, RANDOM  Final   Special Requests NONE  Final   Culture >=100,000 COLONIES/mL ESCHERICHIA COLI  Final   Report Status 07/10/2015 FINAL  Final   Organism ID, Bacteria ESCHERICHIA COLI  Final      Susceptibility   Escherichia coli - MIC*    AMPICILLIN >=32 RESISTANT Resistant     CEFTAZIDIME <=1 SENSITIVE Sensitive     CEFAZOLIN <=4 SENSITIVE Sensitive     CEFTRIAXONE <=1 SENSITIVE Sensitive     CIPROFLOXACIN >=4 RESISTANT Resistant     GENTAMICIN <=1 SENSITIVE Sensitive     IMIPENEM <=0.25 SENSITIVE Sensitive     TRIMETH/SULFA <=20 SENSITIVE Sensitive     NITROFURANTOIN Value in next row Sensitive      SENSITIVE<=16    PIP/TAZO Value in next row Sensitive      SENSITIVE<=4    ERTAPENEM Value in next row Sensitive      SENSITIVE<=0.5    LEVOFLOXACIN Value in next row Resistant      RESISTANT>=8    * >=100,000  COLONIES/mL ESCHERICHIA COLI  MRSA PCR Screening     Status: None   Collection Time: 07/08/15  8:26 PM  Result Value Ref Range Status   MRSA by PCR NEGATIVE NEGATIVE Final    Comment:        The GeneXpert MRSA Assay (FDA approved for NASAL specimens only), is one component of a comprehensive MRSA colonization surveillance program. It is not intended to diagnose MRSA infection nor to guide or monitor treatment for MRSA infections.   Culture, blood (routine x 2)     Status: None (Preliminary result)   Collection Time: 07/08/15  9:05 PM  Result Value Ref Range Status   Specimen Description BLOOD LEFT ARM  Final   Special Requests BOTTLES DRAWN AEROBIC AND ANAEROBIC 5CC  Final   Culture NO GROWTH < 24 HOURS  Final   Report Status PENDING  Incomplete  Culture, blood (routine x 2)     Status: None (Preliminary result)   Collection Time: 07/08/15  9:48 PM  Result Value Ref Range Status   Specimen Description BLOOD RIGHT ARM  Final   Special Requests BOTTLES DRAWN AEROBIC AND ANAEROBIC 5CC  Final   Culture NO GROWTH < 24 HOURS  Final   Report Status PENDING  Incomplete  Urine culture     Status: None   Collection Time: 07/08/15 10:56 PM  Result Value Ref Range Status   Specimen Description URINE, CLEAN CATCH  Final   Special Requests NONE  Final   Culture NO GROWTH 2 DAYS  Final   Report Status 07/10/2015 FINAL  Final    Radiology Reports Dg Lumbar Spine 2-3 Views  07/08/2015  CLINICAL DATA:  Acute low back pain after fall several days ago. EXAM: LUMBAR SPINE - 2-3 VIEW COMPARISON:  None. FINDINGS: No fracture or spondylolisthesis is noted. Mild degenerative disc disease is noted at L1-2, L2-3 and L5-S1 with mild anterior osteophyte formation at these levels. Atherosclerosis of abdominal aorta is noted. IMPRESSION: Mild multilevel degenerative disc disease is noted. No acute abnormality seen in the lumbar spine. Electronically Signed   By: Marijo Conception, M.D.   On: 07/08/2015  08:13   Dg Chest Port 1 View  07/08/2015  CLINICAL DATA:  Fall 2 days ago with persistent back pain EXAM: PORTABLE CHEST - 1 VIEW COMPARISON:  06/21/2014 FINDINGS: Cardiac shadow is stable. The lungs are well aerated bilaterally. No focal infiltrate, effusion or pneumothorax is seen. No bony abnormality is noted. IMPRESSION: No active disease. Electronically Signed   By: Inez Catalina M.D.   On: 07/08/2015 18:18   Dg Ercp Biliary & Pancreatic Ducts  07/09/2015  CLINICAL DATA:  Cholelithiasis EXAM: ERCP TECHNIQUE: Multiple spot images obtained with the fluoroscopic device and submitted for interpretation post-procedure. FLUOROSCOPY TIME:  5 min 48 seconds COMPARISON:  CT 07/08/2015 FINDINGS: Three intraoperative fluoroscopic spot images document endoscopic cannulation and opacification of the CBD. Multiple filling defects in the mid and distal CBD. Incomplete opacification of the intrahepatic bile ducts, which appear dilated centrally. Final image of the series Demonstrates passage of a balloon catheter through the CBD. IMPRESSION: 1. Choledocholithiasis with endoscopic intervention These images were submitted for radiologic interpretation only. Please see the procedural report for the amount of contrast and the fluoroscopy time utilized. Electronically Signed   By: Lucrezia Europe M.D.   On: 07/09/2015 13:37   Ct Renal Stone Study  07/08/2015  CLINICAL DATA:  Left flank pain and hematuria EXAM: CT ABDOMEN AND PELVIS WITHOUT CONTRAST TECHNIQUE: Multidetector CT imaging of the abdomen and pelvis was performed following the standard protocol without IV contrast. COMPARISON:  None. FINDINGS: Lung bases are free of acute infiltrate or sizable effusion. The liver is homogeneous in attenuation without focal mass. Central periportal edema is identified as well as some dilatation of the intra and extrahepatic biliary tree. Multiple gallstones are noted within a distended gallbladder. Additionally, there are multiple  calculi in the distal aspect of the common bile duct causing the dilatation. The spleen, adrenal glands and pancreas are otherwise within normal limits. The kidneys are well visualized bilaterally with nonobstructing renal calculus on the left measuring 4-5 mm. Additionally a cystic lesion is noted from the upper pole of the right kidney. Diffuse aortoiliac calcifications are noted. Mild dilatation to 3.4 cm is noted. The appendix is within normal limits. There  are diverticular changes in the colon although no findings to suggest diverticulitis are seen. A small left inguinal hernia is noted containing a portion of the sigmoid colon although no obstructive changes are seen. The prostate is significantly enlarged measuring 8.8 x 7.6 cm. On the left lateral aspect of the prostate it appears distorted and the possibility of an underlying mass cannot be totally excluded. No definitive pelvic adenopathy is seen. The bladder demonstrates some increased trabeculation likely related to bladder outlet syndrome. IMPRESSION: Enlarged prostate with changes suggestive of bladder outlet obstruction. The prostate is irregular particularly on its left lateral aspect which may represent an underlying mass lesion. Clinical correlation with the physical exam is recommended. Small left inguinal hernia containing a loop of nonobstructed sigmoid colon. Cholelithiasis as well as evidence of intrahepatic and extrahepatic biliary ductal dilatation. There are at multiple calculi within the distal common bile duct. Nonobstructing left renal stone. Diverticulosis without diverticulitis. Electronically Signed   By: Inez Catalina M.D.   On: 07/08/2015 17:43     CBC  Recent Labs Lab 07/08/15 0937 07/08/15 1657 07/08/15 2148 07/09/15 0214 07/10/15 0640  WBC 14.9* 16.3* 15.6* 25.9* 17.3*  HGB 14.0 14.7 13.5 12.7* 12.3*  HCT 41.8 44.1 40.8 38.7* 37.2*  PLT 163 166 121* 115* 111*  MCV 93.9 94.3 94.2 94.6 96.4  MCH 31.5 31.5 31.2 31.1  31.9  MCHC 33.5 33.4 33.1 32.8 33.1  RDW 13.8 13.7 13.4 13.6 14.4  LYMPHSABS  --  0.4* 0.3*  --  0.8  MONOABS  --  0.3 0.9  --  0.8  EOSABS  --  0.0 0.0  --  0.0  BASOSABS  --  0.0 0.0  --  0.0    Chemistries   Recent Labs Lab 07/08/15 0937 07/08/15 1657 07/08/15 2148 07/09/15 0214 07/10/15 0640  NA 138 137 134* 139 142  K 3.8 4.2 3.3* 3.3* 3.6  CL 107 103 102 105 107  CO2 27 20* 21* 21* 26  GLUCOSE 171* 163* 139* 122* 89  BUN 22* 26* 21* 23* 17  CREATININE 1.25* 1.18 1.27* 1.32* 1.09  CALCIUM 8.7* 9.2 8.0* 8.3* 8.0*  MG  --   --  1.4* 1.6*  --   AST 196* 166*  --  90* 44*  ALT 98* 123*  --  83* 56  ALKPHOS 112 109  --  74 82  BILITOT 2.6* 4.8*  --  4.5* 2.1*   ------------------------------------------------------------------------------------------------------------------ estimated creatinine clearance is 60.3 mL/min (by C-G formula based on Cr of 1.09). ------------------------------------------------------------------------------------------------------------------ No results for input(s): HGBA1C in the last 72 hours. ------------------------------------------------------------------------------------------------------------------ No results for input(s): CHOL, HDL, LDLCALC, TRIG, CHOLHDL, LDLDIRECT in the last 72 hours. ------------------------------------------------------------------------------------------------------------------ No results for input(s): TSH, T4TOTAL, T3FREE, THYROIDAB in the last 72 hours.  Invalid input(s): FREET3 ------------------------------------------------------------------------------------------------------------------ No results for input(s): VITAMINB12, FOLATE, FERRITIN, TIBC, IRON, RETICCTPCT in the last 72 hours.  Coagulation profile  Recent Labs Lab 07/08/15 1657 07/08/15 2148  INR 1.13 1.42    No results for input(s): DDIMER in the last 72 hours.  Cardiac Enzymes  Recent Labs Lab 07/08/15 1657  TROPONINI <0.03    ------------------------------------------------------------------------------------------------------------------ Invalid input(s): POCBNP   CBG:  Recent Labs Lab 07/09/15 0743 07/09/15 2148  GLUCAP 102* 97       Studies: Dg Chest Port 1 View  07/08/2015  CLINICAL DATA:  Fall 2 days ago with persistent back pain EXAM: PORTABLE CHEST - 1 VIEW COMPARISON:  06/21/2014 FINDINGS: Cardiac shadow is stable. The lungs  are well aerated bilaterally. No focal infiltrate, effusion or pneumothorax is seen. No bony abnormality is noted. IMPRESSION: No active disease. Electronically Signed   By: Inez Catalina M.D.   On: 07/08/2015 18:18   Dg Ercp Biliary & Pancreatic Ducts  07/09/2015  CLINICAL DATA:  Cholelithiasis EXAM: ERCP TECHNIQUE: Multiple spot images obtained with the fluoroscopic device and submitted for interpretation post-procedure. FLUOROSCOPY TIME:  5 min 48 seconds COMPARISON:  CT 07/08/2015 FINDINGS: Three intraoperative fluoroscopic spot images document endoscopic cannulation and opacification of the CBD. Multiple filling defects in the mid and distal CBD. Incomplete opacification of the intrahepatic bile ducts, which appear dilated centrally. Final image of the series Demonstrates passage of a balloon catheter through the CBD. IMPRESSION: 1. Choledocholithiasis with endoscopic intervention These images were submitted for radiologic interpretation only. Please see the procedural report for the amount of contrast and the fluoroscopy time utilized. Electronically Signed   By: Lucrezia Europe M.D.   On: 07/09/2015 13:37   Ct Renal Stone Study  07/08/2015  CLINICAL DATA:  Left flank pain and hematuria EXAM: CT ABDOMEN AND PELVIS WITHOUT CONTRAST TECHNIQUE: Multidetector CT imaging of the abdomen and pelvis was performed following the standard protocol without IV contrast. COMPARISON:  None. FINDINGS: Lung bases are free of acute infiltrate or sizable effusion. The liver is homogeneous in  attenuation without focal mass. Central periportal edema is identified as well as some dilatation of the intra and extrahepatic biliary tree. Multiple gallstones are noted within a distended gallbladder. Additionally, there are multiple calculi in the distal aspect of the common bile duct causing the dilatation. The spleen, adrenal glands and pancreas are otherwise within normal limits. The kidneys are well visualized bilaterally with nonobstructing renal calculus on the left measuring 4-5 mm. Additionally a cystic lesion is noted from the upper pole of the right kidney. Diffuse aortoiliac calcifications are noted. Mild dilatation to 3.4 cm is noted. The appendix is within normal limits. There are diverticular changes in the colon although no findings to suggest diverticulitis are seen. A small left inguinal hernia is noted containing a portion of the sigmoid colon although no obstructive changes are seen. The prostate is significantly enlarged measuring 8.8 x 7.6 cm. On the left lateral aspect of the prostate it appears distorted and the possibility of an underlying mass cannot be totally excluded. No definitive pelvic adenopathy is seen. The bladder demonstrates some increased trabeculation likely related to bladder outlet syndrome. IMPRESSION: Enlarged prostate with changes suggestive of bladder outlet obstruction. The prostate is irregular particularly on its left lateral aspect which may represent an underlying mass lesion. Clinical correlation with the physical exam is recommended. Small left inguinal hernia containing a loop of nonobstructed sigmoid colon. Cholelithiasis as well as evidence of intrahepatic and extrahepatic biliary ductal dilatation. There are at multiple calculi within the distal common bile duct. Nonobstructing left renal stone. Diverticulosis without diverticulitis. Electronically Signed   By: Inez Catalina M.D.   On: 07/08/2015 17:43      Lab Results  Component Value Date   HGBA1C 5.8  06/22/2014   Lab Results  Component Value Date   CREATININE 1.09 07/10/2015       Scheduled Meds: . famotidine (PEPCID) IV  20 mg Intravenous Q12H  . heparin  5,000 Units Subcutaneous 3 times per day  . piperacillin-tazobactam (ZOSYN)  IV  3.375 g Intravenous Q8H   Continuous Infusions: . sodium chloride 150 mL/hr at 07/09/15 2237    Principal Problem:   Sepsis ([place])  Active Problems:   Acute kidney injury ([place])   Choledocholithiasis   Paroxysmal atrial fibrillation ([facility])   UTI (lower urinary tract infection)    Time spent: 45 minutes   [facility] Pager [phone]. If 7PM-7AM, please contact night-coverage at [email or web address], password [facility] 07/10/2015, 12:46 PM  LOS: 2 days

## 2015-07-10 NOTE — Progress Notes (Addendum)
Charge Nurse in Pt's room reinforcing education to daughter on the importance of bed alarm and safety. Daughter agrees to bed alarm.

## 2015-07-10 NOTE — Progress Notes (Signed)
Pt has gotten out of bed, stumblingly two times this shift with daughter at bedside. Pt is pleasantly confused and reorientation is necessary. Daughter states she will continue to watch him. She does not want bed alarm on. NT is sitting in hallway and we will continue to monitor.

## 2015-07-10 NOTE — Progress Notes (Signed)
Chris Simon 9:26 AM  Subjective: Patient still little confused but no other complaints and no obvious post ERCP problems  Objective: Vital signs stable afebrile no acute distress abdomen is soft nontender white count and liver tests decreased  Assessment: Status post ERCP and stone removal  Plan: Agree with laparoscopic cholecystectomy tomorrow and will be on standby to help when necessary and case discussed with his daughter as well  [facility] E  Pager [phone] After 5PM or if no answer call [phone]

## 2015-07-10 NOTE — Progress Notes (Signed)
Pt up out of bed. Confused but able to reorient. Daughter at bedside. Trauma noted to foley. Daugter states pt was pushing tube back up his pennis after dropping foley bag to floor. Bed alarm was on. Bed in lowest positon, mat on the floor. IV reinforced. Pt cleaned up, bed changed. Will continue to monitor.

## 2015-07-11 ENCOUNTER — Inpatient Hospital Stay (HOSPITAL_COMMUNITY): Payer: Medicare HMO | Admitting: Certified Registered Nurse Anesthetist

## 2015-07-11 ENCOUNTER — Encounter (HOSPITAL_COMMUNITY)
Admission: AD | Disposition: A | Discharge status: Home Health | Payer: Self-pay | Source: Other Acute Inpatient Hospital | Attending: Internal Medicine

## 2015-07-11 HISTORY — PX: CHOLECYSTECTOMY: SHX55

## 2015-07-11 LAB — CULTURE, BLOOD (ROUTINE X 2)

## 2015-07-11 LAB — SURGICAL PCR SCREEN
MRSA, PCR: NEGATIVE
Staphylococcus aureus: NEGATIVE

## 2015-07-11 LAB — PROTIME-INR
INR: 1.13 (ref 0.00–1.49)
PROTHROMBIN TIME: 14.6 s (ref 11.6–15.2)

## 2015-07-11 LAB — COMPREHENSIVE METABOLIC PANEL
ALBUMIN: 2.3 g/dL — AB (ref 3.5–5.0)
ALK PHOS: 90 U/L (ref 38–126)
ALT: 39 U/L (ref 17–63)
ANION GAP: 5 (ref 5–15)
AST: 28 U/L (ref 15–41)
BILIRUBIN TOTAL: 1.4 mg/dL — AB (ref 0.3–1.2)
BUN: 8 mg/dL (ref 6–20)
CALCIUM: 7.7 mg/dL — AB (ref 8.9–10.3)
CO2: 27 mmol/L (ref 22–32)
CREATININE: 0.89 mg/dL (ref 0.61–1.24)
Chloride: 109 mmol/L (ref 101–111)
GFR calc Af Amer: >60 mL/min (ref >60)
GFR calc non Af Amer: >60 mL/min (ref >60)
GLUCOSE: 95 mg/dL (ref 65–99)
Potassium: 3.8 mmol/L (ref 3.5–5.1)
SODIUM: 141 mmol/L (ref 135–145)
TOTAL PROTEIN: 5.1 g/dL — AB (ref 6.5–8.1)

## 2015-07-11 LAB — MRSA PCR SCREENING: MRSA by PCR: NEGATIVE

## 2015-07-11 SURGERY — LAPAROSCOPIC CHOLECYSTECTOMY
Anesthesia: General | Site: Abdomen

## 2015-07-11 MED ORDER — METOPROLOL TARTRATE 1 MG/ML IV SOLN
2.5000 mg | Freq: Once | INTRAVENOUS | Status: AC
  Administered 2015-07-11: 2.5 mg via INTRAVENOUS

## 2015-07-11 MED ORDER — HYDROMORPHONE HCL 1 MG/ML IJ SOLN: 0.2500 mg | INTRAMUSCULAR | Status: DC | PRN

## 2015-07-11 MED ORDER — GLYCOPYRROLATE 0.2 MG/ML IJ SOLN
INTRAMUSCULAR | Status: DC | PRN
  Administered 2015-07-11: 0.4 mg via INTRAVENOUS

## 2015-07-11 MED ORDER — NEOSTIGMINE METHYLSULFATE 10 MG/10ML IV SOLN
INTRAVENOUS | Status: DC | PRN
  Administered 2015-07-11: 3 mg via INTRAVENOUS

## 2015-07-11 MED ORDER — LORAZEPAM 2 MG/ML IJ SOLN
0.5000 mg | Freq: Once | INTRAMUSCULAR | Status: AC
  Administered 2015-07-11: 0.5 mg via INTRAVENOUS
  Filled 2015-07-11: qty 1

## 2015-07-11 MED ORDER — MEPERIDINE HCL 25 MG/ML IJ SOLN: 6.2500 mg | INTRAMUSCULAR | Status: DC | PRN

## 2015-07-11 MED ORDER — PNEUMOCOCCAL VAC POLYVALENT 25 MCG/0.5ML IJ INJ
0.5000 mL | INJECTION | INTRAMUSCULAR | Status: AC
  Administered 2015-07-12: 0.5 mL via INTRAMUSCULAR
  Filled 2015-07-11: qty 0.5

## 2015-07-11 MED ORDER — LIDOCAINE HCL (CARDIAC) 20 MG/ML IV SOLN
INTRAVENOUS | Status: DC | PRN
  Administered 2015-07-11: 80 mg via INTRAVENOUS

## 2015-07-11 MED ORDER — LACTATED RINGERS IV SOLN
INTRAVENOUS | Status: DC | PRN
  Administered 2015-07-11 (×2): via INTRAVENOUS

## 2015-07-11 MED ORDER — ESMOLOL HCL 10 MG/ML IV SOLN
INTRAVENOUS | Status: DC | PRN
  Administered 2015-07-11 (×2): 20 mg via INTRAVENOUS

## 2015-07-11 MED ORDER — METOPROLOL TARTRATE 1 MG/ML IV SOLN
2.5000 mg | Freq: Once | INTRAVENOUS | Status: AC
  Administered 2015-07-11: 2.5 mg via INTRAVENOUS
  Filled 2015-07-11: qty 5

## 2015-07-11 MED ORDER — LACTATED RINGERS IV SOLN
INTRAVENOUS | Status: DC
  Administered 2015-07-11: 09:00:00 via INTRAVENOUS
  Administered 2015-07-11: 10 mL/h via INTRAVENOUS

## 2015-07-11 MED ORDER — BUPIVACAINE-EPINEPHRINE (PF) 0.25% -1:200000 IJ SOLN
INTRAMUSCULAR | Status: AC
  Filled 2015-07-11: qty 30

## 2015-07-11 MED ORDER — ONDANSETRON HCL 4 MG/2ML IJ SOLN
INTRAMUSCULAR | Status: DC | PRN
  Administered 2015-07-11: 4 mg via INTRAVENOUS

## 2015-07-11 MED ORDER — PROPOFOL 10 MG/ML IV BOLUS
INTRAVENOUS | Status: DC | PRN
  Administered 2015-07-11: 130 mg via INTRAVENOUS

## 2015-07-11 MED ORDER — INFLUENZA VAC SPLIT QUAD 0.5 ML IM SUSY
0.5000 mL | PREFILLED_SYRINGE | INTRAMUSCULAR | Status: AC
  Administered 2015-07-12: 0.5 mL via INTRAMUSCULAR
  Filled 2015-07-11: qty 0.5

## 2015-07-11 MED ORDER — METOPROLOL TARTRATE 1 MG/ML IV SOLN
INTRAVENOUS | Status: AC
  Filled 2015-07-11: qty 5

## 2015-07-11 MED ORDER — ONDANSETRON HCL 4 MG/2ML IJ SOLN
INTRAMUSCULAR | Status: AC
  Filled 2015-07-11: qty 2

## 2015-07-11 MED ORDER — SODIUM CHLORIDE 0.9 % IR SOLN
Status: DC | PRN
  Administered 2015-07-11: 1000 mL

## 2015-07-11 MED ORDER — ESMOLOL HCL 10 MG/ML IV SOLN
INTRAVENOUS | Status: AC
  Filled 2015-07-11: qty 10

## 2015-07-11 MED ORDER — ROCURONIUM BROMIDE 100 MG/10ML IV SOLN
INTRAVENOUS | Status: DC | PRN
  Administered 2015-07-11: 10 mg via INTRAVENOUS
  Administered 2015-07-11: 30 mg via INTRAVENOUS

## 2015-07-11 MED ORDER — ROCURONIUM BROMIDE 50 MG/5ML IV SOLN
INTRAVENOUS | Status: AC
  Filled 2015-07-11: qty 1

## 2015-07-11 MED ORDER — 0.9 % SODIUM CHLORIDE (POUR BTL) OPTIME
TOPICAL | Status: DC | PRN
  Administered 2015-07-11: 1000 mL

## 2015-07-11 MED ORDER — METOPROLOL TARTRATE 1 MG/ML IV SOLN: 2.5000 mg | INTRAVENOUS | Status: AC | PRN

## 2015-07-11 MED ORDER — HEMOSTATIC AGENTS (NO CHARGE) OPTIME
TOPICAL | Status: DC | PRN
  Administered 2015-07-11 (×2): 1 via TOPICAL

## 2015-07-11 MED ORDER — FENTANYL CITRATE (PF) 100 MCG/2ML IJ SOLN
INTRAMUSCULAR | Status: DC | PRN
  Administered 2015-07-11: 25 ug via INTRAVENOUS
  Administered 2015-07-11: 50 ug via INTRAVENOUS
  Administered 2015-07-11: 25 ug via INTRAVENOUS
  Administered 2015-07-11: 50 ug via INTRAVENOUS

## 2015-07-11 MED ORDER — METOPROLOL TARTRATE 1 MG/ML IV SOLN: 2.5000 mg | INTRAVENOUS | Status: DC | PRN

## 2015-07-11 MED ORDER — FENTANYL CITRATE (PF) 250 MCG/5ML IJ SOLN
INTRAMUSCULAR | Status: AC
  Filled 2015-07-11: qty 5

## 2015-07-11 MED ORDER — PROMETHAZINE HCL 25 MG/ML IJ SOLN: 6.2500 mg | INTRAMUSCULAR | Status: DC | PRN

## 2015-07-11 MED ORDER — SODIUM CHLORIDE 0.9 % IV SOLN: INTRAVENOUS | Status: DC | PRN

## 2015-07-11 MED ORDER — BUPIVACAINE-EPINEPHRINE 0.25% -1:200000 IJ SOLN
INTRAMUSCULAR | Status: DC | PRN
  Administered 2015-07-11: 15 mL

## 2015-07-11 MED ORDER — LIDOCAINE HCL (CARDIAC) 20 MG/ML IV SOLN
INTRAVENOUS | Status: AC
  Filled 2015-07-11: qty 5

## 2015-07-11 MED ORDER — PHENYLEPHRINE 40 MCG/ML (10ML) SYRINGE FOR IV PUSH (FOR BLOOD PRESSURE SUPPORT)
PREFILLED_SYRINGE | INTRAVENOUS | Status: AC
  Filled 2015-07-11: qty 10

## 2015-07-11 MED ORDER — GLYCOPYRROLATE 0.2 MG/ML IJ SOLN
INTRAMUSCULAR | Status: AC
  Filled 2015-07-11: qty 3

## 2015-07-11 MED ORDER — PHENYLEPHRINE HCL 10 MG/ML IJ SOLN
INTRAMUSCULAR | Status: DC | PRN
  Administered 2015-07-11 (×4): 40 ug via INTRAVENOUS

## 2015-07-11 MED ORDER — MIDAZOLAM HCL 2 MG/2ML IJ SOLN: 0.5000 mg | Freq: Once | INTRAMUSCULAR | Status: DC | PRN

## 2015-07-11 MED ORDER — PROPOFOL 10 MG/ML IV BOLUS
INTRAVENOUS | Status: AC
  Filled 2015-07-11: qty 20

## 2015-07-11 SURGICAL SUPPLY — 45 items
APPLIER CLIP 5 13 M/L LIGAMAX5 (MISCELLANEOUS) ×4
BLADE SURG ROTATE 9660 (MISCELLANEOUS) IMPLANT
CANISTER SUCTION 2500CC (MISCELLANEOUS) ×4 IMPLANT
CHLORAPREP W/TINT 26ML (MISCELLANEOUS) ×4 IMPLANT
CLIP APPLIE 5 13 M/L LIGAMAX5 (MISCELLANEOUS) ×2 IMPLANT
CLOSURE WOUND 1/2 X4 (GAUZE/BANDAGES/DRESSINGS) ×1
COVER MAYO STAND STRL (DRAPES) ×4 IMPLANT
COVER SURGICAL LIGHT HANDLE (MISCELLANEOUS) ×4 IMPLANT
DEVICE TROCAR PUNCTURE CLOSURE (ENDOMECHANICALS) ×4 IMPLANT
DRAPE C-ARM 42X72 X-RAY (DRAPES) ×4 IMPLANT
ELECT REM PT RETURN 9FT ADLT (ELECTROSURGICAL) ×4
ELECTRODE REM PT RTRN 9FT ADLT (ELECTROSURGICAL) ×2 IMPLANT
GLOVE BIO SURGEON STRL SZ7 (GLOVE) ×4 IMPLANT
GLOVE BIO SURGEON STRL SZ8.5 (GLOVE) ×4 IMPLANT
GLOVE BIOGEL PI IND STRL 7.0 (GLOVE) ×2 IMPLANT
GLOVE BIOGEL PI IND STRL 7.5 (GLOVE) ×2 IMPLANT
GLOVE BIOGEL PI IND STRL 8.5 (GLOVE) ×2 IMPLANT
GLOVE BIOGEL PI INDICATOR 7.0 (GLOVE) ×2
GLOVE BIOGEL PI INDICATOR 7.5 (GLOVE) ×2
GLOVE BIOGEL PI INDICATOR 8.5 (GLOVE) ×2
GLOVE SURG SS PI 7.0 STRL IVOR (GLOVE) ×4 IMPLANT
GOWN STRL REUS W/ TWL LRG LVL3 (GOWN DISPOSABLE) ×6 IMPLANT
GOWN STRL REUS W/TWL LRG LVL3 (GOWN DISPOSABLE) ×6
HEMOSTAT SNOW SURGICEL 2X4 (HEMOSTASIS) ×8 IMPLANT
KIT BASIN OR (CUSTOM PROCEDURE TRAY) ×4 IMPLANT
KIT ROOM TURNOVER OR (KITS) ×4 IMPLANT
LIQUID BAND (GAUZE/BANDAGES/DRESSINGS) ×4 IMPLANT
NS IRRIG 1000ML POUR BTL (IV SOLUTION) ×4 IMPLANT
PAD ARMBOARD 7.5X6 YLW CONV (MISCELLANEOUS) ×4 IMPLANT
POUCH RETRIEVAL ECOSAC 10 (ENDOMECHANICALS) ×2 IMPLANT
POUCH RETRIEVAL ECOSAC 10MM (ENDOMECHANICALS) ×2
SCISSORS LAP 5X35 DISP (ENDOMECHANICALS) ×4 IMPLANT
SET CHOLANGIOGRAPH 5 50 .035 (SET/KITS/TRAYS/PACK) ×4 IMPLANT
SET IRRIG TUBING LAPAROSCOPIC (IRRIGATION / IRRIGATOR) ×4 IMPLANT
SLEEVE ENDOPATH XCEL 5M (ENDOMECHANICALS) ×8 IMPLANT
SPECIMEN JAR SMALL (MISCELLANEOUS) ×4 IMPLANT
STRIP CLOSURE SKIN 1/2X4 (GAUZE/BANDAGES/DRESSINGS) ×3 IMPLANT
SUT MNCRL AB 4-0 PS2 18 (SUTURE) ×4 IMPLANT
SUT VICRYL 0 UR6 27IN ABS (SUTURE) ×8 IMPLANT
TOWEL OR 17X24 6PK STRL BLUE (TOWEL DISPOSABLE) ×4 IMPLANT
TOWEL OR 17X26 10 PK STRL BLUE (TOWEL DISPOSABLE) ×4 IMPLANT
TRAY LAPAROSCOPIC MC (CUSTOM PROCEDURE TRAY) ×4 IMPLANT
TROCAR XCEL BLUNT TIP 100MML (ENDOMECHANICALS) ×4 IMPLANT
TROCAR XCEL NON-BLD 5MMX100MML (ENDOMECHANICALS) ×4 IMPLANT
TUBING INSUFFLATION (TUBING) ×4 IMPLANT

## 2015-07-11 NOTE — Progress Notes (Signed)
Pt cont to be tachycardic. Dr.wakefield and Dr.Hollis notified. Metoprolol 2.5mg  iv ordered and IVFB if needed. Will cont to monitor.

## 2015-07-11 NOTE — Anesthesia Preprocedure Evaluation (Addendum)
Anesthesia Evaluation  Patient identified by MRN, date of birth, ID band Patient awake    Reviewed: Allergy & Precautions, NPO status , Patient's Chart, lab work & pertinent test results  History of Anesthesia Complications Negative for: history of anesthetic complications  Airway Mallampati: II  TM Distance: >3 FB Neck ROM: Full    Dental  (+) Teeth Intact, Dental Advisory Given, Edentulous Upper   Pulmonary former smoker,    Pulmonary exam normal breath sounds clear to auscultation       Cardiovascular Normal cardiovascular exam+ dysrhythmias Atrial Fibrillation  Rhythm:Regular Rate:Normal     Neuro/Psych negative neurological ROS  negative psych ROS   GI/Hepatic Neg liver ROS,   Endo/Other  negative endocrine ROS  Renal/GU Renal InsufficiencyRenal disease     Musculoskeletal   Abdominal   Peds  Hematology   Anesthesia Other Findings   Reproductive/Obstetrics                           Anesthesia Physical Anesthesia Plan  ASA: III  Anesthesia Plan: General   Post-op Pain Management:    Induction: Intravenous  Airway Management Planned: Oral ETT  Additional Equipment:   Intra-op Plan:   Post-operative Plan: Extubation in OR  Informed Consent: I have reviewed the patients History and Physical, chart, labs and discussed the procedure including the risks, benefits and alternatives for the proposed anesthesia with the patient or authorized representative who has indicated his/her understanding and acceptance.   Dental advisory given  Plan Discussed with: CRNA, Anesthesiologist and Surgeon  Anesthesia Plan Comments:         Anesthesia Quick Evaluation

## 2015-07-11 NOTE — Progress Notes (Signed)
Giving lunch relief  

## 2015-07-11 NOTE — Anesthesia Procedure Notes (Signed)
Procedure Name: Intubation Date/Time: 07/11/2015 10:12 AM Performed by: Garrison [place] T Pre-anesthesia Checklist: Patient identified, Emergency Drugs available, Suction available and Patient being monitored Patient Re-evaluated:Patient Re-evaluated prior to inductionOxygen Delivery Method: Circle system utilized Preoxygenation: Pre-oxygenation with 100% oxygen Intubation Type: IV induction Ventilation: Mask ventilation without difficulty Laryngoscope Size: Miller and 2 Grade View: Grade I Tube type: Oral Tube size: 7.5 mm Number of attempts: 1 Airway Equipment and Method: Stylet Placement Confirmation: ETT inserted through vocal cords under direct vision,  positive ETCO2 and breath sounds checked- equal and bilateral Secured at: 23 cm Tube secured with: Tape Dental Injury: Teeth and Oropharynx as per pre-operative assessment

## 2015-07-11 NOTE — Transfer of Care (Signed)
Immediate Anesthesia Transfer of Care Note  Patient: Chris Simon  Procedure(s) Performed: Procedure(s): LAPAROSCOPIC CHOLECYSTECTOMY (N/A)  Patient Location: PACU  Anesthesia Type:General  Level of Consciousness: awake and alert   Airway & Oxygen Therapy: Patient Spontanous Breathing and Patient connected to nasal cannula oxygen  Post-op Assessment: Report given to RN, Post -op Vital signs reviewed and stable and Patient moving all extremities X 4  Post vital signs: Reviewed and stable  Last Vitals:  Filed Vitals:   07/11/15 1131  BP: 133/84  Pulse: 105  Temp:   Resp: 24    Complications: No apparent anesthesia complications

## 2015-07-11 NOTE — Care Management Important Message (Signed)
Important Message  Patient Details  Name: Chris Simon MRN: [id number] Date of Birth: 04-09-1934   Medicare Important Message Given:  Yes-second notification given    Delorse Lek 07/11/2015, 11:09 AM

## 2015-07-11 NOTE — Progress Notes (Signed)
Receiving report from [facility] at this time.

## 2015-07-11 NOTE — Progress Notes (Signed)
Patient seen in recovery room and op note and discussion with nurse regarding surgical findings and unfortunately no LFTs were done today but will reorder them for tomorrow and would follow them until normal and if not back to normal on discharge would repeat them in my office on surgical follow-up and please call us back if we could be of any further assistance this hospital stay otherwise postop care per surgical team

## 2015-07-11 NOTE — Progress Notes (Signed)
Returning from lunch. Chris Brow RN states that Dr.Hollis was just at bedside to assess pt and states pt okay to go to room once room available.

## 2015-07-11 NOTE — Progress Notes (Signed)
UR COMPLETED  

## 2015-07-11 NOTE — Progress Notes (Signed)
Called for family to come visit--they state family is in cafeteria

## 2015-07-11 NOTE — Progress Notes (Signed)
2 Days Post-Op  Subjective: Some confusion overnight, no abd pain  Objective: Vital signs in last 24 hours: Temp:  [98.2 F (36.8 C)-98.7 F (37.1 C)] 98.7 F (37.1 C) (10/14 0645) Pulse Rate:  [72-81] 76 (10/14 0645) Resp:  [17-20] 17 (10/14 0645) BP: (124-132)/(56-78) 131/74 mmHg (10/14 0645) SpO2:  [96 %-98 %] 97 % (10/14 0645) Weight:  [89.7 kg (197 lb 12 oz)] 89.7 kg (197 lb 12 oz) (10/13 2116) Last BM Date: 07/07/15  Intake/Output from previous day: 10/13 0701 - 10/14 0700 In: 3730 [P.O.:720; I.V.:2760; IV Piggyback:250] Out: 5809 [Urine:4650] Intake/Output this shift:    GI: soft nt  Lab Results:   Recent Labs  07/09/15 0214 07/10/15 0640  WBC 25.9* 17.3*  HGB 12.7* 12.3*  HCT 38.7* 37.2*  PLT 115* 111*   BMET  Recent Labs  07/09/15 0214 07/10/15 0640  NA 139 142  K 3.3* 3.6  CL 105 107  CO2 21* 26  GLUCOSE 122* 89  BUN 23* 17  CREATININE 1.32* 1.09  CALCIUM 8.3* 8.0*   PT/INR  Recent Labs  07/08/15 1657 07/08/15 2148  LABPROT 14.7 17.4*  INR 1.13 1.42   ABG  Recent Labs  07/08/15 2141  PHART 7.376  HCO3 19.6*    Studies/Results: Dg Ercp Biliary & Pancreatic Ducts  07/09/2015  CLINICAL DATA:  Cholelithiasis EXAM: ERCP TECHNIQUE: Multiple spot images obtained with the fluoroscopic device and submitted for interpretation post-procedure. FLUOROSCOPY TIME:  5 min 48 seconds COMPARISON:  CT 07/08/2015 FINDINGS: Three intraoperative fluoroscopic spot images document endoscopic cannulation and opacification of the CBD. Multiple filling defects in the mid and distal CBD. Incomplete opacification of the intrahepatic bile ducts, which appear dilated centrally. Final image of the series Demonstrates passage of a balloon catheter through the CBD. IMPRESSION: 1. Choledocholithiasis with endoscopic intervention These images were submitted for radiologic interpretation only. Please see the procedural report for the amount of contrast and the  fluoroscopy time utilized. Electronically Signed   By: Lucrezia Europe M.D.   On: 07/09/2015 13:37    Anti-infectives: Anti-infectives    Start     Dose/Rate Route Frequency Ordered Stop   07/08/15 2130  piperacillin-tazobactam (ZOSYN) IVPB 3.375 g     3.375 g 12.5 mL/hr over 240 Minutes Intravenous Every 8 hours 07/08/15 2104        Assessment/Plan: S/p cholangitis with ercp Sund[place]  Discussed with patient and daughter and we will proceed with lap chole today. Risks discussed again.  [facility] 07/11/2015

## 2015-07-11 NOTE — Progress Notes (Addendum)
[facility] PROGRESS NOTE  Chris Simon YTW:[id number] DOB: 1934/07/24 DOA: 07/08/2015 PCP: [name], MD  Length of stay: 3   Assessment/Plan: Principal Problem:   Sepsis ([place]) Active Problems:   Acute kidney injury ([place])   Choledocholithiasis   Paroxysmal atrial fibrillation ([place])   UTI (lower urinary tract infection)   Bile duct stone    Sepsis secondary to UTI/acute cholangitis/gram-negative bacteremia secondary to Escherichia coli Continue Zosyn laparoscopic cholecystectomy , primary umbilical hernia repair on 77/11  Acute metabolic encephalopathy due to sepsis secondary to gram-negative bacteremia, minimize sedation continue to treat underlying infection  Severe sepsis due to UTI/cholangitis, improving with IV antibiotics  Acute kidney injury improving with IV fluids, continue IV fluids  Hematuria likely secondary to Foley trauma, continue IV hydration ,, repeat CBC  Atrial fibrillation with rapid ventricular response, continue amiodarone, iv lopressor prn , hold aspirin, not on anticoagulation  Nonobstructing renal stone/enlarged prostrate with bladder outlet obstruction-continue Foley catheter will need outpatient urology examination, continue Flomax/finasteride   Sinus tachycardia-postop, will check EKG, troponin  Hyperglycemia-continue SSI, hemoglobin A1c pending  Dyslipidemia-hold Crestor  DVT prophylaxsis heparin  Code Status:      Code Status Orders        Start     Ordered   07/08/15 2051  Full code   Continuous     07/08/15 2059     Family Communication: family updated about patient's clinical progress Disposition Plan:  Anticipate discharge in one to 2 days   Brief narrative: 81 y.o. male.  HPI: This gentleman presents transferred from [facility] He presented there with a one-day history of back pain, chills, rigor, and confusion. He was also found to be hypotensive. He has a known history of gallstones. He has had  a CT scan with renal protocol showing a dilated gallbladder with periportal edema, dilated intra-and extra hepatic bile ducts, gallstones, and distal common bile duct stones. He was also found to have elevated liver function tests. Currently, he is still slightly confused. His family is at bedside. He reports minimal abdominal discomfort at this point. He denied any vomiting. Bowel movements have been normal.  Consultants:  General surgery  Gastroenterology    Procedures: ERCP-Multiple large CBD and intrahepatic stones status post removed as above with sphincterotomy and balloon catheter 4. No obvious pancreatic duct injection or wire  advancement 5. No cystic duct filling  Antibiotics: Anti-infectives    Start     Dose/Rate Route Frequency Ordered Stop   07/08/15 2130  [MAR Hold]  piperacillin-tazobactam (ZOSYN) IVPB 3.375 g     (MAR Hold since 07/11/15 0820)   3.375 g 12.5 mL/hr over 240 Minutes Intravenous Every 8 hours 07/08/15 2104           HPI/Subjective: Patient found to be tachycardic postoperatively  Objective: Filed Vitals:   07/11/15 1302 07/11/15 1315 07/11/15 1319 07/11/15 1320  BP: 130/86  133/74   Pulse: 96 103 102   Temp:    97.4 F (36.3 C)  TempSrc:      Resp: 14 15 16    Height:      Weight:      SpO2: 97% 96% 97%     Intake/Output Summary (Last 24 hours) at 07/11/15 1407 Last data filed at 07/11/15 1115  Gross per 24 hour  Intake   4470 ml  Output   5130 ml  Net   -660 ml    Exam: General: Somnolent Neuro:  Non-focal HEENT: [place]/AT,  PERRL, no appreciable JVD Cardiovascular: RRR, no MRG Lungs: Clear bilateral breath sounds Abdomen: Soft, non-tender, non-distended Musculoskeletal: No acute deformity or ROM limitation Skin: Grossly intact  Data Review   Micro Results Recent Results (from the past 240 hour(s))  Blood Culture (routine x 2)     Status: None   Collection Time: 07/08/15  4:51 PM  Result Value Ref Range Status    Specimen Description BLOOD RIGHT ARM  Final   Special Requests BOTTLES DRAWN AEROBIC AND ANAEROBIC 6CC  Final   Culture  Setup Time   Final    GRAM NEGATIVE RODS IN BOTH AEROBIC AND ANAEROBIC BOTTLES CRITICAL RESULT CALLED TO, READ BACK BY AND VERIFIED WITH: LEA FERGUSON @0426  07/09/15.Marland KitchenMarland KitchenAJO CONFIRMED BY DV    Culture ESCHERICHIA COLI  Final   Report Status 07/11/2015 FINAL  Final   Organism ID, Bacteria ESCHERICHIA COLI  Final      Susceptibility   Escherichia coli - MIC*    AMPICILLIN >=32 RESISTANT Resistant     CEFTAZIDIME <=1 SENSITIVE Sensitive     CEFAZOLIN <=4 SENSITIVE Sensitive     CEFTRIAXONE <=1 SENSITIVE Sensitive     CIPROFLOXACIN >=4 RESISTANT Resistant     GENTAMICIN <=1 SENSITIVE Sensitive     IMIPENEM <=0.25 SENSITIVE Sensitive     TRIMETH/SULFA <=20 SENSITIVE Sensitive     * ESCHERICHIA COLI  Blood Culture (routine x 2)     Status: None   Collection Time: 07/08/15  4:56 PM  Result Value Ref Range Status   Specimen Description BLOOD RIGHT HAND  Final   Special Requests BOTTLES DRAWN AEROBIC AND ANAEROBIC 6CC  Final   Culture  Setup Time   Final    GRAM NEGATIVE RODS IN BOTH AEROBIC AND ANAEROBIC BOTTLES CRITICAL RESULT CALLED TO, READ BACK BY AND VERIFIED WITH: LEA FERGUSON @0426  07/09/15.Marland KitchenMarland KitchenAJO    Culture ESCHERICHIA COLI  Final   Report Status 07/11/2015 FINAL  Final   Organism ID, Bacteria ESCHERICHIA COLI  Final      Susceptibility   Escherichia coli - MIC*    AMPICILLIN >=32 RESISTANT Resistant     CEFTAZIDIME <=1 SENSITIVE Sensitive     CEFAZOLIN <=4 SENSITIVE Sensitive     CEFTRIAXONE <=1 SENSITIVE Sensitive     CIPROFLOXACIN >=4 RESISTANT Resistant     GENTAMICIN <=1 SENSITIVE Sensitive     IMIPENEM <=0.25 SENSITIVE Sensitive     TRIMETH/SULFA <=20 SENSITIVE Sensitive     * ESCHERICHIA COLI  Urine culture     Status: None   Collection Time: 07/08/15  4:57 PM  Result Value Ref Range Status   Specimen Description URINE, RANDOM  Final   Special  Requests NONE  Final   Culture >=100,000 COLONIES/mL ESCHERICHIA COLI  Final   Report Status 07/10/2015 FINAL  Final   Organism ID, Bacteria ESCHERICHIA COLI  Final      Susceptibility   Escherichia coli - MIC*    AMPICILLIN >=32 RESISTANT Resistant     CEFTAZIDIME <=1 SENSITIVE Sensitive     CEFAZOLIN <=4 SENSITIVE Sensitive     CEFTRIAXONE <=1 SENSITIVE Sensitive     CIPROFLOXACIN >=4 RESISTANT Resistant     GENTAMICIN <=1 SENSITIVE Sensitive     IMIPENEM <=0.25 SENSITIVE Sensitive     TRIMETH/SULFA <=20 SENSITIVE Sensitive     NITROFURANTOIN Value in next row Sensitive      SENSITIVE<=16    PIP/TAZO Value in next row Sensitive      SENSITIVE<=4    ERTAPENEM Value in  next row Sensitive      SENSITIVE<=0.5    LEVOFLOXACIN Value in next row Resistant      RESISTANT>=8    * >=100,000 COLONIES/mL ESCHERICHIA COLI  MRSA PCR Screening     Status: None   Collection Time: 07/08/15  8:26 PM  Result Value Ref Range Status   MRSA by PCR NEGATIVE NEGATIVE Final    Comment:        The GeneXpert MRSA Assay (FDA approved for NASAL specimens only), is one component of a comprehensive MRSA colonization surveillance program. It is not intended to diagnose MRSA infection nor to guide or monitor treatment for MRSA infections.   Culture, blood (routine x 2)     Status: None (Preliminary result)   Collection Time: 07/08/15  9:05 PM  Result Value Ref Range Status   Specimen Description BLOOD LEFT ARM  Final   Special Requests BOTTLES DRAWN AEROBIC AND ANAEROBIC 5CC  Final   Culture NO GROWTH 2 DAYS  Final   Report Status PENDING  Incomplete  Culture, blood (routine x 2)     Status: None (Preliminary result)   Collection Time: 07/08/15  9:48 PM  Result Value Ref Range Status   Specimen Description BLOOD RIGHT ARM  Final   Special Requests BOTTLES DRAWN AEROBIC AND ANAEROBIC 5CC  Final   Culture NO GROWTH 2 DAYS  Final   Report Status PENDING  Incomplete  Urine culture     Status: None    Collection Time: 07/08/15 10:56 PM  Result Value Ref Range Status   Specimen Description URINE, CLEAN CATCH  Final   Special Requests NONE  Final   Culture NO GROWTH 2 DAYS  Final   Report Status 07/10/2015 FINAL  Final  Surgical pcr screen     Status: None   Collection Time: 07/11/15  6:25 AM  Result Value Ref Range Status   MRSA, PCR NEGATIVE NEGATIVE Final   Staphylococcus aureus NEGATIVE NEGATIVE Final    Comment:        The Xpert SA Assay (FDA approved for NASAL specimens in patients over 3 years of age), is one component of a comprehensive surveillance program.  Test performance has been validated by [facility] for patients greater than or equal to 29 year old. It is not intended to diagnose infection nor to guide or monitor treatment.     Radiology Reports Dg Lumbar Spine 2-3 Views  07/08/2015  CLINICAL DATA:  Acute low back pain after fall several days ago. EXAM: LUMBAR SPINE - 2-3 VIEW COMPARISON:  None. FINDINGS: No fracture or spondylolisthesis is noted. Mild degenerative disc disease is noted at L1-2, L2-3 and L5-S1 with mild anterior osteophyte formation at these levels. Atherosclerosis of abdominal aorta is noted. IMPRESSION: Mild multilevel degenerative disc disease is noted. No acute abnormality seen in the lumbar spine. Electronically Signed   By: Marijo Conception, M.D.   On: 07/08/2015 08:13   Dg Chest Port 1 View  07/08/2015  CLINICAL DATA:  Fall 2 days ago with persistent back pain EXAM: PORTABLE CHEST - 1 VIEW COMPARISON:  06/21/2014 FINDINGS: Cardiac shadow is stable. The lungs are well aerated bilaterally. No focal infiltrate, effusion or pneumothorax is seen. No bony abnormality is noted. IMPRESSION: No active disease. Electronically Signed   By: Inez Catalina M.D.   On: 07/08/2015 18:18   Dg Ercp Biliary & Pancreatic Ducts  07/09/2015  CLINICAL DATA:  Cholelithiasis EXAM: ERCP TECHNIQUE: Multiple spot images obtained with the fluoroscopic device  and  submitted for interpretation post-procedure. FLUOROSCOPY TIME:  5 min 48 seconds COMPARISON:  CT 07/08/2015 FINDINGS: Three intraoperative fluoroscopic spot images document endoscopic cannulation and opacification of the CBD. Multiple filling defects in the mid and distal CBD. Incomplete opacification of the intrahepatic bile ducts, which appear dilated centrally. Final image of the series Demonstrates passage of a balloon catheter through the CBD. IMPRESSION: 1. Choledocholithiasis with endoscopic intervention These images were submitted for radiologic interpretation only. Please see the procedural report for the amount of contrast and the fluoroscopy time utilized. Electronically Signed   By: Lucrezia Europe M.D.   On: 07/09/2015 13:37   Ct Renal Stone Study  07/08/2015  CLINICAL DATA:  Left flank pain and hematuria EXAM: CT ABDOMEN AND PELVIS WITHOUT CONTRAST TECHNIQUE: Multidetector CT imaging of the abdomen and pelvis was performed following the standard protocol without IV contrast. COMPARISON:  None. FINDINGS: Lung bases are free of acute infiltrate or sizable effusion. The liver is homogeneous in attenuation without focal mass. Central periportal edema is identified as well as some dilatation of the intra and extrahepatic biliary tree. Multiple gallstones are noted within a distended gallbladder. Additionally, there are multiple calculi in the distal aspect of the common bile duct causing the dilatation. The spleen, adrenal glands and pancreas are otherwise within normal limits. The kidneys are well visualized bilaterally with nonobstructing renal calculus on the left measuring 4-5 mm. Additionally a cystic lesion is noted from the upper pole of the right kidney. Diffuse aortoiliac calcifications are noted. Mild dilatation to 3.4 cm is noted. The appendix is within normal limits. There are diverticular changes in the colon although no findings to suggest diverticulitis are seen. A small left inguinal hernia  is noted containing a portion of the sigmoid colon although no obstructive changes are seen. The prostate is significantly enlarged measuring 8.8 x 7.6 cm. On the left lateral aspect of the prostate it appears distorted and the possibility of an underlying mass cannot be totally excluded. No definitive pelvic adenopathy is seen. The bladder demonstrates some increased trabeculation likely related to bladder outlet syndrome. IMPRESSION: Enlarged prostate with changes suggestive of bladder outlet obstruction. The prostate is irregular particularly on its left lateral aspect which may represent an underlying mass lesion. Clinical correlation with the physical exam is recommended. Small left inguinal hernia containing a loop of nonobstructed sigmoid colon. Cholelithiasis as well as evidence of intrahepatic and extrahepatic biliary ductal dilatation. There are at multiple calculi within the distal common bile duct. Nonobstructing left renal stone. Diverticulosis without diverticulitis. Electronically Signed   By: Inez Catalina M.D.   On: 07/08/2015 17:43     CBC  Recent Labs Lab 07/08/15 0937 07/08/15 1657 07/08/15 2148 07/09/15 0214 07/10/15 0640  WBC 14.9* 16.3* 15.6* 25.9* 17.3*  HGB 14.0 14.7 13.5 12.7* 12.3*  HCT 41.8 44.1 40.8 38.7* 37.2*  PLT 163 166 121* 115* 111*  MCV 93.9 94.3 94.2 94.6 96.4  MCH 31.5 31.5 31.2 31.1 31.9  MCHC 33.5 33.4 33.1 32.8 33.1  RDW 13.8 13.7 13.4 13.6 14.4  LYMPHSABS  --  0.4* 0.3*  --  0.8  MONOABS  --  0.3 0.9  --  0.8  EOSABS  --  0.0 0.0  --  0.0  BASOSABS  --  0.0 0.0  --  0.0    Chemistries   Recent Labs Lab 07/08/15 0937 07/08/15 1657 07/08/15 2148 07/09/15 0214 07/10/15 0640  NA 138 137 134* 139 142  K 3.8 4.2 3.3*  3.3* 3.6  CL 107 103 102 105 107  CO2 27 20* 21* 21* 26  GLUCOSE 171* 163* 139* 122* 89  BUN 22* 26* 21* 23* 17  CREATININE 1.25* 1.18 1.27* 1.32* 1.09  CALCIUM 8.7* 9.2 8.0* 8.3* 8.0*  MG  --   --  1.4* 1.6*  --   AST 196*  166*  --  90* 44*  ALT 98* 123*  --  83* 56  ALKPHOS 112 109  --  74 82  BILITOT 2.6* 4.8*  --  4.5* 2.1*   ------------------------------------------------------------------------------------------------------------------ estimated creatinine clearance is 60.3 mL/min (by C-G formula based on Cr of 1.09). ------------------------------------------------------------------------------------------------------------------ No results for input(s): HGBA1C in the last 72 hours. ------------------------------------------------------------------------------------------------------------------ No results for input(s): CHOL, HDL, LDLCALC, TRIG, CHOLHDL, LDLDIRECT in the last 72 hours. ------------------------------------------------------------------------------------------------------------------ No results for input(s): TSH, T4TOTAL, T3FREE, THYROIDAB in the last 72 hours.  Invalid input(s): FREET3 ------------------------------------------------------------------------------------------------------------------ No results for input(s): VITAMINB12, FOLATE, FERRITIN, TIBC, IRON, RETICCTPCT in the last 72 hours.  Coagulation profile  Recent Labs Lab 07/08/15 1657 07/08/15 2148  INR 1.13 1.42    No results for input(s): DDIMER in the last 72 hours.  Cardiac Enzymes  Recent Labs Lab 07/08/15 1657  TROPONINI <0.03   ------------------------------------------------------------------------------------------------------------------ Invalid input(s): POCBNP   CBG:  Recent Labs Lab 07/09/15 0743 07/09/15 2148  GLUCAP 102* 97       Studies: No results found.    Lab Results  Component Value Date   HGBA1C 5.8 06/22/2014   Lab Results  Component Value Date   CREATININE 1.09 07/10/2015       Scheduled Meds: . [MAR Hold] amiodarone  200 mg Oral Daily  . [MAR Hold] famotidine (PEPCID) IV  20 mg Intravenous Q12H  . [MAR Hold] finasteride  5 mg Oral Daily  . [MAR Hold]  heparin  5,000 Units Subcutaneous 3 times per day  . metoprolol      . [MAR Hold] piperacillin-tazobactam (ZOSYN)  IV  3.375 g Intravenous Q8H  . [MAR Hold] tamsulosin  0.4 mg Oral Daily   Continuous Infusions: . sodium chloride 100 mL/hr at 07/11/15 0247  . lactated ringers 20 mL/hr at 07/11/15 0844    Principal Problem:   Sepsis ([place]) Active Problems:   Acute kidney injury ([place])   Choledocholithiasis   Paroxysmal atrial fibrillation ([facility])   UTI (lower urinary tract infection)   Bile duct stone    Time spent: 45 minutes   [facility] Pager [phone]. If 7PM-7AM, please contact night-coverage at [email or web address], password [email or web address] 07/11/2015, 2:07 PM  LOS: 3 days

## 2015-07-11 NOTE — Progress Notes (Signed)
Family updated per Virl Son RN

## 2015-07-11 NOTE — Anesthesia Postprocedure Evaluation (Signed)
  Anesthesia Post-op Note  Patient: Chris Simon  Procedure(s) Performed: Procedure(s): LAPAROSCOPIC CHOLECYSTECTOMY (N/A)  Patient Location: PACU  Anesthesia Type:General  Level of Consciousness: awake and alert   Airway and Oxygen Therapy: Patient Spontanous Breathing  Post-op Pain: mild  Post-op Assessment: Post-op Vital signs reviewed and Patient's Cardiovascular Status Stable              Post-op Vital Signs: Reviewed and stable  Last Vitals:  Filed Vitals:   07/11/15 1320  BP:   Pulse:   Temp: 36.3 C  Resp:     Complications: No apparent anesthesia complications

## 2015-07-11 NOTE — Progress Notes (Signed)
Patient has been attempting to get out of bed multiple times, taking off telemetry monitor, and tugging on foley catheter. Patient is confused but easily reoriented. Patient's daughter at the bedside. Patient brought up to the nurses station to keep a closer eye on. Patient becomes more agitated. MD notified. Orders placed and implemented. Patient is now asleep. RN will continue to monitor patient.   Ermalinda Memos, RN

## 2015-07-11 NOTE — Progress Notes (Signed)
ANTIBIOTIC CONSULT NOTE - FOLLOW UP  Pharmacy Consult for Zosyn Indication: Ecoli UTI/bacteremia  Allergies  Allergen Reactions  . Clindamycin/Lincomycin Other (See Comments)    Reaction:  Unknown   . Sulfamethoxazole-Trimethoprim Rash    Patient Measurements: Height: 6' (182.9 cm) Weight: 192 lb 0.3 oz (87.1 kg) IBW/kg (Calculated) : 77.6  Vital Signs: Temp: 98 F (36.7 C) (10/14 1509) Temp Source: Oral (10/14 1509) BP: 130/82 mmHg (10/14 1509) Pulse Rate: 111 (10/14 1509) Intake/Output from previous day: 10/13 0701 - 10/14 0700 In: 4481 [P.O.:720; I.V.:2760; IV Piggyback:250] Out: 8563 [Urine:4650] Intake/Output from this shift: Total I/O In: 1800 [I.V.:1800] Out: 2530 [Urine:2500; Blood:30]  Labs:  Recent Labs  07/08/15 2148 07/09/15 0214 07/10/15 0640  WBC 15.6* 25.9* 17.3*  HGB 13.5 12.7* 12.3*  PLT 121* 115* 111*  CREATININE 1.27* 1.32* 1.09   Estimated Creatinine Clearance: 60.3 mL/min (by C-G formula based on Cr of 1.09). No results for input(s): VANCOTROUGH, VANCOPEAK, VANCORANDOM, GENTTROUGH, GENTPEAK, GENTRANDOM, TOBRATROUGH, TOBRAPEAK, TOBRARND, AMIKACINPEAK, AMIKACINTROU, AMIKACIN in the last 72 hours.   Microbiology: Recent Results (from the past 720 hour(s))  Blood Culture (routine x 2)     Status: None   Collection Time: 07/08/15  4:51 PM  Result Value Ref Range Status   Specimen Description BLOOD RIGHT ARM  Final   Special Requests BOTTLES DRAWN AEROBIC AND ANAEROBIC 6CC  Final   Culture  Setup Time   Final    GRAM NEGATIVE RODS IN BOTH AEROBIC AND ANAEROBIC BOTTLES CRITICAL RESULT CALLED TO, READ BACK BY AND VERIFIED WITH: LEA FERGUSON @0426  07/09/15.Marland KitchenMarland KitchenAJO CONFIRMED BY DV    Culture ESCHERICHIA COLI  Final   Report Status 07/11/2015 FINAL  Final   Organism ID, Bacteria ESCHERICHIA COLI  Final      Susceptibility   Escherichia coli - MIC*    AMPICILLIN >=32 RESISTANT Resistant     CEFTAZIDIME <=1 SENSITIVE Sensitive     CEFAZOLIN <=4  SENSITIVE Sensitive     CEFTRIAXONE <=1 SENSITIVE Sensitive     CIPROFLOXACIN >=4 RESISTANT Resistant     GENTAMICIN <=1 SENSITIVE Sensitive     IMIPENEM <=0.25 SENSITIVE Sensitive     TRIMETH/SULFA <=20 SENSITIVE Sensitive     * ESCHERICHIA COLI  Blood Culture (routine x 2)     Status: None   Collection Time: 07/08/15  4:56 PM  Result Value Ref Range Status   Specimen Description BLOOD RIGHT HAND  Final   Special Requests BOTTLES DRAWN AEROBIC AND ANAEROBIC 6CC  Final   Culture  Setup Time   Final    GRAM NEGATIVE RODS IN BOTH AEROBIC AND ANAEROBIC BOTTLES CRITICAL RESULT CALLED TO, READ BACK BY AND VERIFIED WITH: LEA FERGUSON @0426  07/09/15.Marland KitchenMarland KitchenAJO    Culture ESCHERICHIA COLI  Final   Report Status 07/11/2015 FINAL  Final   Organism ID, Bacteria ESCHERICHIA COLI  Final      Susceptibility   Escherichia coli - MIC*    AMPICILLIN >=32 RESISTANT Resistant     CEFTAZIDIME <=1 SENSITIVE Sensitive     CEFAZOLIN <=4 SENSITIVE Sensitive     CEFTRIAXONE <=1 SENSITIVE Sensitive     CIPROFLOXACIN >=4 RESISTANT Resistant     GENTAMICIN <=1 SENSITIVE Sensitive     IMIPENEM <=0.25 SENSITIVE Sensitive     TRIMETH/SULFA <=20 SENSITIVE Sensitive     * ESCHERICHIA COLI  Urine culture     Status: None   Collection Time: 07/08/15  4:57 PM  Result Value Ref Range Status   Specimen Description URINE,  RANDOM  Final   Special Requests NONE  Final   Culture >=100,000 COLONIES/mL ESCHERICHIA COLI  Final   Report Status 07/10/2015 FINAL  Final   Organism ID, Bacteria ESCHERICHIA COLI  Final      Susceptibility   Escherichia coli - MIC*    AMPICILLIN >=32 RESISTANT Resistant     CEFTAZIDIME <=1 SENSITIVE Sensitive     CEFAZOLIN <=4 SENSITIVE Sensitive     CEFTRIAXONE <=1 SENSITIVE Sensitive     CIPROFLOXACIN >=4 RESISTANT Resistant     GENTAMICIN <=1 SENSITIVE Sensitive     IMIPENEM <=0.25 SENSITIVE Sensitive     TRIMETH/SULFA <=20 SENSITIVE Sensitive     NITROFURANTOIN Value in next row  Sensitive      SENSITIVE<=16    PIP/TAZO Value in next row Sensitive      SENSITIVE<=4    ERTAPENEM Value in next row Sensitive      SENSITIVE<=0.5    LEVOFLOXACIN Value in next row Resistant      RESISTANT>=8    * >=100,000 COLONIES/mL ESCHERICHIA COLI  MRSA PCR Screening     Status: None   Collection Time: 07/08/15  8:26 PM  Result Value Ref Range Status   MRSA by PCR NEGATIVE NEGATIVE Final    Comment:        The GeneXpert MRSA Assay (FDA approved for NASAL specimens only), is one component of a comprehensive MRSA colonization surveillance program. It is not intended to diagnose MRSA infection nor to guide or monitor treatment for MRSA infections.   Culture, blood (routine x 2)     Status: None (Preliminary result)   Collection Time: 07/08/15  9:05 PM  Result Value Ref Range Status   Specimen Description BLOOD LEFT ARM  Final   Special Requests BOTTLES DRAWN AEROBIC AND ANAEROBIC 5CC  Final   Culture NO GROWTH 3 DAYS  Final   Report Status PENDING  Incomplete  Culture, blood (routine x 2)     Status: None (Preliminary result)   Collection Time: 07/08/15  9:48 PM  Result Value Ref Range Status   Specimen Description BLOOD RIGHT ARM  Final   Special Requests BOTTLES DRAWN AEROBIC AND ANAEROBIC 5CC  Final   Culture NO GROWTH 3 DAYS  Final   Report Status PENDING  Incomplete  Urine culture     Status: None   Collection Time: 07/08/15 10:56 PM  Result Value Ref Range Status   Specimen Description URINE, CLEAN CATCH  Final   Special Requests NONE  Final   Culture NO GROWTH 2 DAYS  Final   Report Status 07/10/2015 FINAL  Final  Surgical pcr screen     Status: None   Collection Time: 07/11/15  6:25 AM  Result Value Ref Range Status   MRSA, PCR NEGATIVE NEGATIVE Final   Staphylococcus aureus NEGATIVE NEGATIVE Final    Comment:        The Xpert SA Assay (FDA approved for NASAL specimens in patients over 65 years of age), is one component of a comprehensive  surveillance program.  Test performance has been validated by [facility] for patients greater than or equal to 32 year old. It is not intended to diagnose infection nor to guide or monitor treatment.     Anti-infectives    Start     Dose/Rate Route Frequency Ordered Stop   07/08/15 2130  piperacillin-tazobactam (ZOSYN) IVPB 3.375 g     3.375 g 12.5 mL/hr over 240 Minutes Intravenous Every 8 hours 07/08/15 2104  Assessment: 81 yo M continues on Zosyn d#4 for Ecoli UTI/bacteremia.  Pt is s/p lap-chole today.  WBC remains elevated but trending down.  SCr also trending down with hydration.    Goal of Therapy:  Renal dose adjustment of antibiotics  Plan:  Continue Zosyn 3.375 gm IV q8h (4 hour infusion).  [employer], Pharm.D., BCPS Clinical Pharmacist Pager [phone] 07/11/2015 3:50 PM

## 2015-07-11 NOTE — Op Note (Signed)
Preoperative diagnosis: cholangitis s/p ercp, gallstones Postoperative diagnosis: saa, chronic cholecystitis Procedure: laparoscopic cholecystectomy , primary umbilical hernia repair Surgeon: Dr Serita Grammes Anesthesia: general EBL: minimal Drains none Specimen gb and contents to pathology Complications: none Sponge count correct at completion Disposition to recovery stable  Indications: This is a 18 yom who presented with cholangitis and has undergone ercp by Dr Watt Climes to clear his duct. I think he is ready to undergo lap chole to prevent future episodes.  We discussed proceeding with lap chole.   Procedure: After informed consent was obtained the patient was taken to the operating room. He was already given antibiotics. Sequential compression devices were on his legs. He was placed under general anesthesia without complication. His abdomen was prepped and draped in the standard sterile surgical fashion. A surgical timeout was then performed.  I infiltrated marcaine below the umbilicus. I made an incision and then entered the fascia sharply. I then entered the peritoneum bluntly. There was no evidence of an entry injury. He did have a small umbilical hernia that was present. I placed a 0 vicryl pursestring suture and inserted a hasson trocar. I then inserted 3 further 5 mm trocars in the epigastrium and ruq.. I dissected the duodenum away from the gallbladder due to scarring. His gallbladder was very inflamed. Iwas able to retract the gallbladder cephalad and lateral.  Eventually I was able to identify the cystic duct and clearly had the critical view of safety. I was able to visualize the CBD well also.I had planned to do cholangiogram but due to diminutive nature of cystic duct, it was short and inflammation I decided not to pursue this.  His bilirubin is coming down after ercp and we can follow.  I then clipped the cystic duct and divided it. The duct was viable and the clips  traversed the duct.  I then clipped the cystic artery and divided it.The anterior and posterior branches were treated separately.  I then removed the gallbladder from the liver bed and placed it in a bag. It was then removed from the umbilical incision. I then obtained hemostasis and irrigated. I did place surgicel snow in the bed as it was very friable.  I then removed the umbilical trocar and closed with 0 vicryl and the endoclose device after tying down the pursestring. I closed the small umbilical hernia with the endoclose and a 0 vicryl as well.  I then desufflated the abdomen and removed all my remaining trocars. I then closed these with 4-0 Monocryl and Dermabond. He tolerated this well was extubated and transferred to the recovery room in stable condition

## 2015-07-12 DIAGNOSIS — A4150 Gram-negative sepsis, unspecified: Secondary | ICD-10-CM | POA: Diagnosis not present

## 2015-07-12 DIAGNOSIS — A4151 Sepsis due to Escherichia coli [E. coli]: Secondary | ICD-10-CM

## 2015-07-12 LAB — CBC
HEMATOCRIT: 38.9 % — AB (ref 39.0–52.0)
Hemoglobin: 12.7 g/dL — ABNORMAL LOW (ref 13.0–17.0)
MCH: 31.7 pg (ref 26.0–34.0)
MCHC: 32.6 g/dL (ref 30.0–36.0)
MCV: 97 fL (ref 78.0–100.0)
Platelets: 143 10*3/uL — ABNORMAL LOW (ref 150–400)
RBC: 4.01 MIL/uL — ABNORMAL LOW (ref 4.22–5.81)
RDW: 13.7 % (ref 11.5–15.5)
WBC: 10.8 10*3/uL — AB (ref 4.0–10.5)

## 2015-07-12 LAB — COMPREHENSIVE METABOLIC PANEL
ALT: 37 U/L (ref 17–63)
AST: 25 U/L (ref 15–41)
Albumin: 2.2 g/dL — ABNORMAL LOW (ref 3.5–5.0)
Alkaline Phosphatase: 96 U/L (ref 38–126)
Anion gap: 8 (ref 5–15)
BILIRUBIN TOTAL: 1.6 mg/dL — AB (ref 0.3–1.2)
BUN: 7 mg/dL (ref 6–20)
CALCIUM: 7.6 mg/dL — AB (ref 8.9–10.3)
CO2: 28 mmol/L (ref 22–32)
CREATININE: 0.99 mg/dL (ref 0.61–1.24)
Chloride: 103 mmol/L (ref 101–111)
Glucose, Bld: 98 mg/dL (ref 65–99)
POTASSIUM: 3.5 mmol/L (ref 3.5–5.1)
Sodium: 139 mmol/L (ref 135–145)
TOTAL PROTEIN: 5 g/dL — AB (ref 6.5–8.1)

## 2015-07-12 LAB — HEMOGLOBIN A1C
Hgb A1c MFr Bld: 5.6 % (ref 4.8–5.6)
Mean Plasma Glucose: 114 mg/dL

## 2015-07-12 MED ORDER — TRAMADOL HCL 50 MG PO TABS
50.0000 mg | ORAL_TABLET | Freq: Four times a day (QID) | ORAL | Status: DC | PRN
Start: 2015-07-12 — End: 2015-07-14
  Administered 2015-07-12: 100 mg via ORAL
  Administered 2015-07-12: 50 mg via ORAL
  Filled 2015-07-12: qty 2
  Filled 2015-07-12: qty 1

## 2015-07-12 MED ORDER — ROSUVASTATIN CALCIUM 10 MG PO TABS
10.0000 mg | ORAL_TABLET | Freq: Every day | ORAL | Status: DC
  Administered 2015-07-12 – 2015-07-14 (×3): 10 mg via ORAL
  Filled 2015-07-12 (×3): qty 1

## 2015-07-12 MED ORDER — PHENOL 1.4 % MT LIQD: 2.0000 | OROMUCOSAL | Status: DC | PRN

## 2015-07-12 MED ORDER — FENTANYL CITRATE (PF) 100 MCG/2ML IJ SOLN
25.0000 ug | INTRAMUSCULAR | Status: DC | PRN
Start: 2015-07-12 — End: 2015-07-14
  Administered 2015-07-12: 25 ug via INTRAVENOUS
  Administered 2015-07-13: 50 ug via INTRAVENOUS
  Filled 2015-07-12 (×2): qty 2

## 2015-07-12 MED ORDER — MAGIC MOUTHWASH
15.0000 mL | Freq: Four times a day (QID) | ORAL | Status: DC | PRN
  Filled 2015-07-12: qty 15

## 2015-07-12 MED ORDER — ONDANSETRON HCL 4 MG PO TABS: 4.0000 mg | ORAL_TABLET | Freq: Four times a day (QID) | ORAL | Status: DC | PRN

## 2015-07-12 MED ORDER — BLISTEX MEDICATED EX OINT
TOPICAL_OINTMENT | Freq: Two times a day (BID) | CUTANEOUS | Status: DC
  Administered 2015-07-12 – 2015-07-13 (×4): via TOPICAL
  Filled 2015-07-12: qty 10

## 2015-07-12 MED ORDER — ADULT MULTIVITAMIN W/MINERALS CH
1.0000 | ORAL_TABLET | Freq: Every day | ORAL | Status: DC
  Administered 2015-07-12 – 2015-07-14 (×3): 1 via ORAL
  Filled 2015-07-12 (×3): qty 1

## 2015-07-12 MED ORDER — SACCHAROMYCES BOULARDII 250 MG PO CAPS
250.0000 mg | ORAL_CAPSULE | Freq: Two times a day (BID) | ORAL | Status: DC
  Administered 2015-07-12 – 2015-07-14 (×5): 250 mg via ORAL
  Filled 2015-07-12 (×6): qty 1

## 2015-07-12 MED ORDER — LIP MEDEX EX OINT: 1.0000 "application " | TOPICAL_OINTMENT | Freq: Two times a day (BID) | CUTANEOUS | Status: DC

## 2015-07-12 MED ORDER — ASPIRIN EC 325 MG PO TBEC
325.0000 mg | DELAYED_RELEASE_TABLET | Freq: Every day | ORAL | Status: DC
  Administered 2015-07-12 – 2015-07-14 (×3): 325 mg via ORAL
  Filled 2015-07-12 (×3): qty 1

## 2015-07-12 MED ORDER — DILTIAZEM HCL 25 MG/5ML IV SOLN
10.0000 mg | Freq: Four times a day (QID) | INTRAVENOUS | Status: DC | PRN
  Administered 2015-07-12: 10 mg via INTRAVENOUS
  Filled 2015-07-12 (×2): qty 5

## 2015-07-12 MED ORDER — LACTATED RINGERS IV BOLUS (SEPSIS): 1000.0000 mL | Freq: Three times a day (TID) | INTRAVENOUS | Status: AC | PRN

## 2015-07-12 MED ORDER — FAMOTIDINE 20 MG PO TABS
20.0000 mg | ORAL_TABLET | Freq: Two times a day (BID) | ORAL | Status: DC
  Administered 2015-07-12 – 2015-07-14 (×5): 20 mg via ORAL
  Filled 2015-07-12 (×6): qty 1

## 2015-07-12 MED ORDER — SODIUM CHLORIDE 0.9 % IV SOLN
250.0000 mL | INTRAVENOUS | Status: DC | PRN
Start: 2015-07-12 — End: 2015-07-13

## 2015-07-12 MED ORDER — DEXTROSE 5 % IV SOLN
1.0000 g | INTRAVENOUS | Status: DC
  Administered 2015-07-12 – 2015-07-14 (×3): 1 g via INTRAVENOUS
  Filled 2015-07-12 (×3): qty 10

## 2015-07-12 MED ORDER — FENTANYL CITRATE (PF) 100 MCG/2ML IJ SOLN: 50.0000 ug | INTRAMUSCULAR | Status: DC | PRN

## 2015-07-12 MED ORDER — PROMETHAZINE HCL 25 MG/ML IJ SOLN: 6.2500 mg | INTRAMUSCULAR | Status: DC | PRN

## 2015-07-12 MED ORDER — DIPHENHYDRAMINE HCL 50 MG/ML IJ SOLN: 12.5000 mg | Freq: Four times a day (QID) | INTRAMUSCULAR | Status: DC | PRN

## 2015-07-12 MED ORDER — BISACODYL 10 MG RE SUPP: 10.0000 mg | Freq: Two times a day (BID) | RECTAL | Status: DC | PRN

## 2015-07-12 MED ORDER — SODIUM CHLORIDE 0.9 % IJ SOLN: 3.0000 mL | INTRAMUSCULAR | Status: DC | PRN

## 2015-07-12 MED ORDER — MENTHOL 3 MG MT LOZG
1.0000 | LOZENGE | OROMUCOSAL | Status: DC | PRN
Start: 2015-07-12 — End: 2015-07-14
  Filled 2015-07-12: qty 9

## 2015-07-12 MED ORDER — SODIUM CHLORIDE 0.9 % IJ SOLN
3.0000 mL | Freq: Two times a day (BID) | INTRAMUSCULAR | Status: DC
  Administered 2015-07-12: 3 mL via INTRAVENOUS

## 2015-07-12 MED ORDER — ALUM & MAG HYDROXIDE-SIMETH 200-200-20 MG/5ML PO SUSP: 30.0000 mL | Freq: Four times a day (QID) | ORAL | Status: DC | PRN

## 2015-07-12 MED ORDER — ONDANSETRON 4 MG PO TBDP
4.0000 mg | ORAL_TABLET | Freq: Four times a day (QID) | ORAL | Status: DC | PRN
  Filled 2015-07-12: qty 2

## 2015-07-12 MED ORDER — POLYETHYLENE GLYCOL 3350 17 G PO PACK
17.0000 g | PACK | Freq: Two times a day (BID) | ORAL | Status: DC
  Administered 2015-07-12 – 2015-07-13 (×3): 17 g via ORAL
  Filled 2015-07-12 (×7): qty 1

## 2015-07-12 NOTE — Progress Notes (Addendum)
[facility] PROGRESS NOTE  Quashon Jesus VVO:[id number] DOB: 1934-09-13 DOA: 07/08/2015 PCP: [name], MD  Length of stay: 4   Assessment/Plan: Principal Problem:   Sepsis ([place]) Active Problems:   Acute kidney injury ([place])   Choledocholithiasis   Paroxysmal atrial fibrillation ([place])   UTI (lower urinary tract infection)   Bile duct stone    Sepsis secondary to UTI/acute cholangitis/gram-negative bacteremia secondary to Escherichia coli Change Zosyn to Rocephin, discussed with infectious disease, continue IV antibiotics as long as the patient is inpatient, will switch patient to Keflex by mouth for 2 more weeks at discharge laparoscopic cholecystectomy , primary umbilical hernia repair on 10/14 Advance to full liquids today   Acute metabolic encephalopathy due to sepsis secondary to gram-negative bacteremia, minimize sedation continue to treat underlying infection  Severe sepsis due to UTI/cholangitis, improving with IV antibiotics  Acute kidney injury improving with IV fluids, continue IV fluids, DC fluids when the patient is able to eat and advance his diet  Hematuria likely secondary to Foley trauma, CBC stable, hematuria resolved  Atrial fibrillation with rapid ventricular response patient continues to be tachycardic postoperatively, continue amiodarone, iv lopressor prn , hold aspirin, not on anticoagulation, followed by cardiologist in [place] renal stone/enlarged prostrate with bladder outlet obstruction-continue Foley catheter will need outpatient urology examination, continue Flomax/finasteride    Hyperglycemia-continue SSI, hemoglobin A1c pending  Dyslipidemia-hold Crestor  DVT prophylaxsis heparin  Code Status:      Code Status Orders        Start     Ordered   07/08/15 2051  Full code   Continuous     07/08/15 2059     Family Communication: family updated about patient's clinical progress Disposition  Plan:  Anticipate discharge in one to 2 days   Brief narrative: 81 y.o. male.  HPI: This gentleman presents transferred from [facility]. He presented there with a one-day history of back pain, chills, rigor, and confusion. He was also found to be hypotensive. He has a known history of gallstones. He has had a CT scan with renal protocol showing a dilated gallbladder with periportal edema, dilated intra-and extra hepatic bile ducts, gallstones, and distal common bile duct stones. He was also found to have elevated liver function tests. Currently, he is still slightly confused. His family is at bedside. He reports minimal abdominal discomfort at this point. He denied any vomiting. Bowel movements have been normal.  Consultants:  General surgery  Gastroenterology    Procedures: ERCP-Multiple large CBD and intrahepatic stones status post removed as above with sphincterotomy and balloon catheter 4. No obvious pancreatic duct injection or wire  advancement 5. No cystic duct filling  Antibiotics: Anti-infectives    Start     Dose/Rate Route Frequency Ordered Stop   07/08/15 2130  piperacillin-tazobactam (ZOSYN) IVPB 3.375 g     3.375 g 12.5 mL/hr over 240 Minutes Intravenous Every 8 hours 07/08/15 2104           HPI/Subjective: Patient is still tachycardic but appears to be comfortable in no acute distress  Objective: Filed Vitals:   07/12/15 0600 07/12/15 0655 07/12/15 0700 07/12/15 0820  BP: 141/77   143/87  Pulse:    109  Temp:   98 F (36.7 C)   TempSrc:   Oral   Resp: 0   18  Height:      Weight:  86.2 kg (190 lb 0.6 oz)    SpO2:  94%    Intake/Output Summary (Last 24 hours) at 07/12/15 1110 Last data filed at 07/12/15 1000  Gross per 24 hour  Intake 3387.49 ml  Output   4200 ml  Net -812.51 ml    Exam: General: Somnolent Neuro:  Non-focal HEENT: [place]/AT, PERRL, no appreciable JVD Cardiovascular: RRR, no MRG Lungs: Clear bilateral breath  sounds Abdomen: Soft, non-tender, non-distended Musculoskeletal: No acute deformity or ROM limitation Skin: Grossly intact  Data Review   Micro Results Recent Results (from the past 240 hour(s))  Blood Culture (routine x 2)     Status: None   Collection Time: 07/08/15  4:51 PM  Result Value Ref Range Status   Specimen Description BLOOD RIGHT ARM  Final   Special Requests BOTTLES DRAWN AEROBIC AND ANAEROBIC 6CC  Final   Culture  Setup Time   Final    GRAM NEGATIVE RODS IN BOTH AEROBIC AND ANAEROBIC BOTTLES CRITICAL RESULT CALLED TO, READ BACK BY AND VERIFIED WITH: LEA FERGUSON @0426  07/09/15.Marland KitchenMarland KitchenAJO CONFIRMED BY DV    Culture ESCHERICHIA COLI  Final   Report Status 07/11/2015 FINAL  Final   Organism ID, Bacteria ESCHERICHIA COLI  Final      Susceptibility   Escherichia coli - MIC*    AMPICILLIN >=32 RESISTANT Resistant     CEFTAZIDIME <=1 SENSITIVE Sensitive     CEFAZOLIN <=4 SENSITIVE Sensitive     CEFTRIAXONE <=1 SENSITIVE Sensitive     CIPROFLOXACIN >=4 RESISTANT Resistant     GENTAMICIN <=1 SENSITIVE Sensitive     IMIPENEM <=0.25 SENSITIVE Sensitive     TRIMETH/SULFA <=20 SENSITIVE Sensitive     * ESCHERICHIA COLI  Blood Culture (routine x 2)     Status: None   Collection Time: 07/08/15  4:56 PM  Result Value Ref Range Status   Specimen Description BLOOD RIGHT HAND  Final   Special Requests BOTTLES DRAWN AEROBIC AND ANAEROBIC 6CC  Final   Culture  Setup Time   Final    GRAM NEGATIVE RODS IN BOTH AEROBIC AND ANAEROBIC BOTTLES CRITICAL RESULT CALLED TO, READ BACK BY AND VERIFIED WITH: LEA FERGUSON @0426  07/09/15.Marland KitchenMarland KitchenAJO    Culture ESCHERICHIA COLI  Final   Report Status 07/11/2015 FINAL  Final   Organism ID, Bacteria ESCHERICHIA COLI  Final      Susceptibility   Escherichia coli - MIC*    AMPICILLIN >=32 RESISTANT Resistant     CEFTAZIDIME <=1 SENSITIVE Sensitive     CEFAZOLIN <=4 SENSITIVE Sensitive     CEFTRIAXONE <=1 SENSITIVE Sensitive     CIPROFLOXACIN >=4  RESISTANT Resistant     GENTAMICIN <=1 SENSITIVE Sensitive     IMIPENEM <=0.25 SENSITIVE Sensitive     TRIMETH/SULFA <=20 SENSITIVE Sensitive     * ESCHERICHIA COLI  Urine culture     Status: None   Collection Time: 07/08/15  4:57 PM  Result Value Ref Range Status   Specimen Description URINE, RANDOM  Final   Special Requests NONE  Final   Culture >=100,000 COLONIES/mL ESCHERICHIA COLI  Final   Report Status 07/10/2015 FINAL  Final   Organism ID, Bacteria ESCHERICHIA COLI  Final      Susceptibility   Escherichia coli - MIC*    AMPICILLIN >=32 RESISTANT Resistant     CEFTAZIDIME <=1 SENSITIVE Sensitive     CEFAZOLIN <=4 SENSITIVE Sensitive     CEFTRIAXONE <=1 SENSITIVE Sensitive     CIPROFLOXACIN >=4 RESISTANT Resistant     GENTAMICIN <=1 SENSITIVE Sensitive     IMIPENEM <=  0.25 SENSITIVE Sensitive     TRIMETH/SULFA <=20 SENSITIVE Sensitive     NITROFURANTOIN Value in next row Sensitive      SENSITIVE<=16    PIP/TAZO Value in next row Sensitive      SENSITIVE<=4    ERTAPENEM Value in next row Sensitive      SENSITIVE<=0.5    LEVOFLOXACIN Value in next row Resistant      RESISTANT>=8    * >=100,000 COLONIES/mL ESCHERICHIA COLI  MRSA PCR Screening     Status: None   Collection Time: 07/08/15  8:26 PM  Result Value Ref Range Status   MRSA by PCR NEGATIVE NEGATIVE Final    Comment:        The GeneXpert MRSA Assay (FDA approved for NASAL specimens only), is one component of a comprehensive MRSA colonization surveillance program. It is not intended to diagnose MRSA infection nor to guide or monitor treatment for MRSA infections.   Culture, blood (routine x 2)     Status: None (Preliminary result)   Collection Time: 07/08/15  9:05 PM  Result Value Ref Range Status   Specimen Description BLOOD LEFT ARM  Final   Special Requests BOTTLES DRAWN AEROBIC AND ANAEROBIC 5CC  Final   Culture NO GROWTH 3 DAYS  Final   Report Status PENDING  Incomplete  Culture, blood (routine x 2)      Status: None (Preliminary result)   Collection Time: 07/08/15  9:48 PM  Result Value Ref Range Status   Specimen Description BLOOD RIGHT ARM  Final   Special Requests BOTTLES DRAWN AEROBIC AND ANAEROBIC 5CC  Final   Culture NO GROWTH 3 DAYS  Final   Report Status PENDING  Incomplete  Urine culture     Status: None   Collection Time: 07/08/15 10:56 PM  Result Value Ref Range Status   Specimen Description URINE, CLEAN CATCH  Final   Special Requests NONE  Final   Culture NO GROWTH 2 DAYS  Final   Report Status 07/10/2015 FINAL  Final  Surgical pcr screen     Status: None   Collection Time: 07/11/15  6:25 AM  Result Value Ref Range Status   MRSA, PCR NEGATIVE NEGATIVE Final   Staphylococcus aureus NEGATIVE NEGATIVE Final    Comment:        The Xpert SA Assay (FDA approved for NASAL specimens in patients over 26 years of age), is one component of a comprehensive surveillance program.  Test performance has been validated by [facility] for patients greater than or equal to 58 year old. It is not intended to diagnose infection nor to guide or monitor treatment.   MRSA PCR Screening     Status: None   Collection Time: 07/11/15  3:35 PM  Result Value Ref Range Status   MRSA by PCR NEGATIVE NEGATIVE Final    Comment:        The GeneXpert MRSA Assay (FDA approved for NASAL specimens only), is one component of a comprehensive MRSA colonization surveillance program. It is not intended to diagnose MRSA infection nor to guide or monitor treatment for MRSA infections.     Radiology Reports Dg Lumbar Spine 2-3 Views  07/08/2015  CLINICAL DATA:  Acute low back pain after fall several days ago. EXAM: LUMBAR SPINE - 2-3 VIEW COMPARISON:  None. FINDINGS: No fracture or spondylolisthesis is noted. Mild degenerative disc disease is noted at L1-2, L2-3 and L5-S1 with mild anterior osteophyte formation at these levels. Atherosclerosis of abdominal aorta is noted.  IMPRESSION: Mild  multilevel degenerative disc disease is noted. No acute abnormality seen in the lumbar spine. Electronically Signed   By: Marijo Conception, M.D.   On: 07/08/2015 08:13   Dg Chest Port 1 View  07/08/2015  CLINICAL DATA:  Fall 2 days ago with persistent back pain EXAM: PORTABLE CHEST - 1 VIEW COMPARISON:  06/21/2014 FINDINGS: Cardiac shadow is stable. The lungs are well aerated bilaterally. No focal infiltrate, effusion or pneumothorax is seen. No bony abnormality is noted. IMPRESSION: No active disease. Electronically Signed   By: Inez Catalina M.D.   On: 07/08/2015 18:18   Dg Ercp Biliary & Pancreatic Ducts  07/09/2015  CLINICAL DATA:  Cholelithiasis EXAM: ERCP TECHNIQUE: Multiple spot images obtained with the fluoroscopic device and submitted for interpretation post-procedure. FLUOROSCOPY TIME:  5 min 48 seconds COMPARISON:  CT 07/08/2015 FINDINGS: Three intraoperative fluoroscopic spot images document endoscopic cannulation and opacification of the CBD. Multiple filling defects in the mid and distal CBD. Incomplete opacification of the intrahepatic bile ducts, which appear dilated centrally. Final image of the series Demonstrates passage of a balloon catheter through the CBD. IMPRESSION: 1. Choledocholithiasis with endoscopic intervention These images were submitted for radiologic interpretation only. Please see the procedural report for the amount of contrast and the fluoroscopy time utilized. Electronically Signed   By: Lucrezia Europe M.D.   On: 07/09/2015 13:37   Ct Renal Stone Study  07/08/2015  CLINICAL DATA:  Left flank pain and hematuria EXAM: CT ABDOMEN AND PELVIS WITHOUT CONTRAST TECHNIQUE: Multidetector CT imaging of the abdomen and pelvis was performed following the standard protocol without IV contrast. COMPARISON:  None. FINDINGS: Lung bases are free of acute infiltrate or sizable effusion. The liver is homogeneous in attenuation without focal mass. Central periportal edema is identified as well  as some dilatation of the intra and extrahepatic biliary tree. Multiple gallstones are noted within a distended gallbladder. Additionally, there are multiple calculi in the distal aspect of the common bile duct causing the dilatation. The spleen, adrenal glands and pancreas are otherwise within normal limits. The kidneys are well visualized bilaterally with nonobstructing renal calculus on the left measuring 4-5 mm. Additionally a cystic lesion is noted from the upper pole of the right kidney. Diffuse aortoiliac calcifications are noted. Mild dilatation to 3.4 cm is noted. The appendix is within normal limits. There are diverticular changes in the colon although no findings to suggest diverticulitis are seen. A small left inguinal hernia is noted containing a portion of the sigmoid colon although no obstructive changes are seen. The prostate is significantly enlarged measuring 8.8 x 7.6 cm. On the left lateral aspect of the prostate it appears distorted and the possibility of an underlying mass cannot be totally excluded. No definitive pelvic adenopathy is seen. The bladder demonstrates some increased trabeculation likely related to bladder outlet syndrome. IMPRESSION: Enlarged prostate with changes suggestive of bladder outlet obstruction. The prostate is irregular particularly on its left lateral aspect which may represent an underlying mass lesion. Clinical correlation with the physical exam is recommended. Small left inguinal hernia containing a loop of nonobstructed sigmoid colon. Cholelithiasis as well as evidence of intrahepatic and extrahepatic biliary ductal dilatation. There are at multiple calculi within the distal common bile duct. Nonobstructing left renal stone. Diverticulosis without diverticulitis. Electronically Signed   By: Inez Catalina M.D.   On: 07/08/2015 17:43     CBC  Recent Labs Lab 07/08/15 1657 07/08/15 2148 07/09/15 0214 07/10/15 0640 07/12/15 0545  WBC  16.3* 15.6* 25.9* 17.3*  10.8*  HGB 14.7 13.5 12.7* 12.3* 12.7*  HCT 44.1 40.8 38.7* 37.2* 38.9*  PLT 166 121* 115* 111* 143*  MCV 94.3 94.2 94.6 96.4 97.0  MCH 31.5 31.2 31.1 31.9 31.7  MCHC 33.4 33.1 32.8 33.1 32.6  RDW 13.7 13.4 13.6 14.4 13.7  LYMPHSABS 0.4* 0.3*  --  0.8  --   MONOABS 0.3 0.9  --  0.8  --   EOSABS 0.0 0.0  --  0.0  --   BASOSABS 0.0 0.0  --  0.0  --     Chemistries   Recent Labs Lab 07/08/15 1657 07/08/15 2148 07/09/15 0214 07/10/15 0640 07/11/15 1801 07/12/15 0545  NA 137 134* 139 142 141 139  K 4.2 3.3* 3.3* 3.6 3.8 3.5  CL 103 102 105 107 109 103  CO2 20* 21* 21* 26 27 28   GLUCOSE 163* 139* 122* 89 95 98  BUN 26* 21* 23* 17 8 7   CREATININE 1.18 1.27* 1.32* 1.09 0.89 0.99  CALCIUM 9.2 8.0* 8.3* 8.0* 7.7* 7.6*  MG  --  1.4* 1.6*  --   --   --   AST 166*  --  90* 44* 28 25  ALT 123*  --  83* 56 39 37  ALKPHOS 109  --  74 82 90 96  BILITOT 4.8*  --  4.5* 2.1* 1.4* 1.6*   ------------------------------------------------------------------------------------------------------------------ estimated creatinine clearance is 66.4 mL/min (by C-G formula based on Cr of 0.99). ------------------------------------------------------------------------------------------------------------------  Recent Labs  07/11/15 1801  HGBA1C 5.6   ------------------------------------------------------------------------------------------------------------------ No results for input(s): CHOL, HDL, LDLCALC, TRIG, CHOLHDL, LDLDIRECT in the last 72 hours. ------------------------------------------------------------------------------------------------------------------ No results for input(s): TSH, T4TOTAL, T3FREE, THYROIDAB in the last 72 hours.  Invalid input(s): FREET3 ------------------------------------------------------------------------------------------------------------------ No results for input(s): VITAMINB12, FOLATE, FERRITIN, TIBC, IRON, RETICCTPCT in the last 72 hours.  Coagulation  profile  Recent Labs Lab 07/08/15 1657 07/08/15 2148 07/11/15 1453  INR 1.13 1.42 1.13    No results for input(s): DDIMER in the last 72 hours.  Cardiac Enzymes  Recent Labs Lab 07/08/15 1657  TROPONINI <0.03   ------------------------------------------------------------------------------------------------------------------ Invalid input(s): POCBNP   CBG:  Recent Labs Lab 07/09/15 0743 07/09/15 2148  GLUCAP 102* 97       Studies: No results found.    Lab Results  Component Value Date   HGBA1C 5.6 07/11/2015   HGBA1C 5.8 06/22/2014   Lab Results  Component Value Date   CREATININE 0.99 07/12/2015       Scheduled Meds: . amiodarone  200 mg Oral Daily  . aspirin EC  325 mg Oral Daily  . famotidine  20 mg Oral BID  . finasteride  5 mg Oral Daily  . heparin  5,000 Units Subcutaneous 3 times per day  . lip balm   Topical BID  . multivitamin with minerals  1 tablet Oral Daily  . piperacillin-tazobactam (ZOSYN)  IV  3.375 g Intravenous Q8H  . polyethylene glycol  17 g Oral BID  . rosuvastatin  10 mg Oral Daily  . saccharomyces boulardii  250 mg Oral BID  . sodium chloride  3 mL Intravenous Q12H  . tamsulosin  0.4 mg Oral Daily   Continuous Infusions: . sodium chloride 50 mL/hr at 07/12/15 0905    Principal Problem:   Sepsis ([place]) Active Problems:   Acute kidney injury ([place])   Choledocholithiasis   Paroxysmal atrial fibrillation ([facility])   UTI (lower urinary tract infection)   Bile duct stone  Time spent: 45 minutes   [facility] Pager [phone]. If 7PM-7AM, please contact night-coverage at [email or web address], password [facility] 07/12/2015, 11:10 AM  LOS: 4 days

## 2015-07-12 NOTE — Progress Notes (Signed)
[address] [place], [place] [address] Phone: [phone] FAX: [phone]   Kenroy Timberman [id number] 19-Dec-1933   Problem List:   Principal Problem:   Sepsis [facility]) Active Problems:   Acute kidney injury [facility])   Choledocholithiasis   Paroxysmal atrial fibrillation (HCC)   UTI (lower urinary tract infection)   Bile duct stone   1 Day Post-Op  07/11/2015  Preoperative diagnosis: cholangitis s/p ercp, gallstones Postoperative diagnosis: acute on chronic cholecystitis,  cholangitis s/p ercp, gallstones Procedure: laparoscopic cholecystectomy (no IOC) , primary umbilical hernia repair Surgeon: Dr Serita Grammes His gallbladder was very inflamed  Assessment  OK  Plan:  -slight inc LFTs - prob variation - follow -adv diet as tolerated -wean IVF -IVF ABx x 5 d postop - cont IV for now -Afib w RVR - rate control per primary service.  OK to restart ASA -ARF resolved - wean IVF as tolerated w PRN IVF boluses -VTE prophylaxis- SCDs, etc -mobilize as tolerated to help recovery  I updated the status of the patient to the pt, patient's daughter & ICU RN.  I made recommendations.  I answered questions.  Understanding & appreciation was expressed.  [name], M.D., F.A.C.S. Gastrointestinal and Minimally Invasive Surgery [facility] [address], [place], [place] [address] [phone] Main / Paging   07/12/2015  Subjective:  Denies pain Daughter in room ICU RN just outside door Sitting up  Tol clears  Objective:  Vital signs:  Filed Vitals:   07/12/15 0600 07/12/15 0655 07/12/15 0700 07/12/15 0820  BP: 141/77   143/87  Pulse:    109  Temp:   98 F (36.7 C)   TempSrc:   Oral   Resp: 0   18  Height:      Weight:  86.2 kg (190 lb 0.6 oz)    SpO2:    94%    Last BM Date: 07/10/15  Intake/Output   Yesterday:  10/14 0701 - 10/15 0700 In: 3653.3  [I.V.:3553.3; IV Piggyback:100] Out: 5830 [Urine:5800; Blood:30] This shift:  Total I/O In: 240 [P.O.:240] Out: -   Bowel function:  Flatus: y  BM: n  Drain: n/a  Physical Exam:  General: Pt awake/alert/oriented x4 in mild distress Eyes: PERRL, normal EOM.  Sclera clear.  No icterus Neuro: CN II-XII intact w/o focal sensory/motor deficits. Lymph: No head/neck/groin lymphadenopathy Psych:  No delerium/psychosis/paranoia HENT: Normocephalic, Mucus membranes moist.  No thrush Neck: Supple, No tracheal deviation Chest: No chest wall pain w good excursion CV:  Pulses intact.  Irregular rhythm.  HR 100-110's MS: Normal AROM mjr joints.  No obvious deformity Abdomen: Soft.  Nondistended.  Mildly tender at incisions only.  No evidence of peritonitis.  No incarcerated hernias. Ext:  SCDs BLE.  No mjr edema.  No cyanosis Skin: No petechiae / purpura  Results:   Labs: Results for orders placed or performed during the hospital encounter of 07/08/15 (from the past 48 hour(s))  Surgical pcr screen     Status: None   Collection Time: 07/11/15  6:25 AM  Result Value Ref Range   MRSA, PCR NEGATIVE NEGATIVE   Staphylococcus aureus NEGATIVE NEGATIVE    Comment:        The Xpert SA Assay (FDA approved for NASAL specimens in patients over 100 years of age), is one component of a comprehensive surveillance program.  Test performance has been validated by [facility] for patients greater than  or equal to 37 year old. It is not intended to diagnose infection nor to guide or monitor treatment.   Protime-INR     Status: None   Collection Time: 07/11/15  2:53 PM  Result Value Ref Range   Prothrombin Time 14.6 11.6 - 15.2 seconds   INR 1.13 0.00 - 1.49  MRSA PCR Screening     Status: None   Collection Time: 07/11/15  3:35 PM  Result Value Ref Range   MRSA by PCR NEGATIVE NEGATIVE    Comment:        The GeneXpert MRSA Assay (FDA approved for NASAL specimens only), is one component of  a comprehensive MRSA colonization surveillance program. It is not intended to diagnose MRSA infection nor to guide or monitor treatment for MRSA infections.   Comprehensive metabolic panel     Status: Abnormal   Collection Time: 07/11/15  6:01 PM  Result Value Ref Range   Sodium 141 135 - 145 mmol/L   Potassium 3.8 3.5 - 5.1 mmol/L   Chloride 109 101 - 111 mmol/L   CO2 27 22 - 32 mmol/L   Glucose, Bld 95 65 - 99 mg/dL   BUN 8 6 - 20 mg/dL   Creatinine, Ser 0.89 0.61 - 1.24 mg/dL   Calcium 7.7 (L) 8.9 - 10.3 mg/dL   Total Protein 5.1 (L) 6.5 - 8.1 g/dL   Albumin 2.3 (L) 3.5 - 5.0 g/dL   AST 28 15 - 41 U/L   ALT 39 17 - 63 U/L   Alkaline Phosphatase 90 38 - 126 U/L   Total Bilirubin 1.4 (H) 0.3 - 1.2 mg/dL   GFR calc non Af Amer >60 >60 mL/min   GFR calc Af Amer >60 >60 mL/min    Comment: (NOTE) The eGFR has been calculated using the CKD EPI equation. This calculation has not been validated in all clinical situations. eGFR's persistently <60 mL/min signify possible Chronic Kidney Disease.    Anion gap 5 5 - 15  Hemoglobin A1c     Status: None   Collection Time: 07/11/15  6:01 PM  Result Value Ref Range   Hgb A1c MFr Bld 5.6 4.8 - 5.6 %    Comment: (NOTE)         Pre-diabetes: 5.7 - 6.4         Diabetes: >6.4         Glycemic control for adults with diabetes: <7.0    Mean Plasma Glucose 114 mg/dL    Comment: (NOTE) Performed At: [facility] [place], [place] [id number] Lindon Romp MD [id number]   CBC     Status: Abnormal   Collection Time: 07/12/15  5:45 AM  Result Value Ref Range   WBC 10.8 (H) 4.0 - 10.5 K/uL   RBC 4.01 (L) 4.22 - 5.81 MIL/uL   Hemoglobin 12.7 (L) 13.0 - 17.0 g/dL   HCT 38.9 (L) 39.0 - 52.0 %   MCV 97.0 78.0 - 100.0 fL   MCH 31.7 26.0 - 34.0 pg   MCHC 32.6 30.0 - 36.0 g/dL   RDW 13.7 11.5 - 15.5 %   Platelets 143 (L) 150 - 400 K/uL  Comprehensive metabolic panel     Status: Abnormal   Collection Time:  07/12/15  5:45 AM  Result Value Ref Range   Sodium 139 135 - 145 mmol/L   Potassium 3.5 3.5 - 5.1 mmol/L   Chloride 103 101 - 111 mmol/L   CO2 28 22 -  32 mmol/L   Glucose, Bld 98 65 - 99 mg/dL   BUN 7 6 - 20 mg/dL   Creatinine, Ser 0.99 0.61 - 1.24 mg/dL   Calcium 7.6 (L) 8.9 - 10.3 mg/dL   Total Protein 5.0 (L) 6.5 - 8.1 g/dL   Albumin 2.2 (L) 3.5 - 5.0 g/dL   AST 25 15 - 41 U/L   ALT 37 17 - 63 U/L   Alkaline Phosphatase 96 38 - 126 U/L   Total Bilirubin 1.6 (H) 0.3 - 1.2 mg/dL   GFR calc non Af Amer >60 >60 mL/min   GFR calc Af Amer >60 >60 mL/min    Comment: (NOTE) The eGFR has been calculated using the CKD EPI equation. This calculation has not been validated in all clinical situations. eGFR's persistently <60 mL/min signify possible Chronic Kidney Disease.    Anion gap 8 5 - 15    Imaging / Studies: No results found.  Medications / Allergies: per chart  Antibiotics: Anti-infectives    Start     Dose/Rate Route Frequency Ordered Stop   07/08/15 2130  piperacillin-tazobactam (ZOSYN) IVPB 3.375 g     3.375 g 12.5 mL/hr over 240 Minutes Intravenous Every 8 hours 07/08/15 2104          Note: Portions of this report may have been transcribed using voice recognition software. Every effort was made to ensure accuracy; however, inadvertent computerized transcription errors may be present.   Any transcriptional errors that result from this process are unintentional.     [name], M.D., F.A.C.S. Gastrointestinal and Minimally Invasive Surgery [facility] [address] [place], [place] [address] [phone] Main / Paging   07/12/2015  CARE TEAM:  PCP: [name], MD  Outpatient Care Team: Patient Care Team: [name], MD as PCP - General (Family Medicine)  Inpatient Treatment Team: Treatment Team: Attending Provider: Reyne Dumas, MD; Consulting Physician: Nolon Nations, MD; Rounding Team: Trenton Gammon,  MD; Technician: Meredith Staggers, NT; Registered Nurse: Lazarus Gowda, RN; Consulting Physician: Teena Irani, MD; Registered Nurse: Radonna Ricker, RN

## 2015-07-13 DIAGNOSIS — K812 Acute cholecystitis with chronic cholecystitis: Secondary | ICD-10-CM

## 2015-07-13 LAB — CBC
HCT: 39 % (ref 39.0–52.0)
Hemoglobin: 12.9 g/dL — ABNORMAL LOW (ref 13.0–17.0)
MCH: 31.1 pg (ref 26.0–34.0)
MCHC: 33.1 g/dL (ref 30.0–36.0)
MCV: 94 fL (ref 78.0–100.0)
Platelets: 160 K/uL (ref 150–400)
RBC: 4.15 MIL/uL — ABNORMAL LOW (ref 4.22–5.81)
RDW: 13.3 % (ref 11.5–15.5)
WBC: 9 K/uL (ref 4.0–10.5)

## 2015-07-13 LAB — CULTURE, BLOOD (ROUTINE X 2)
CULTURE: NO GROWTH
Culture: NO GROWTH

## 2015-07-13 LAB — COMPREHENSIVE METABOLIC PANEL WITH GFR
ALT: 34 U/L (ref 17–63)
AST: 26 U/L (ref 15–41)
Albumin: 2.2 g/dL — ABNORMAL LOW (ref 3.5–5.0)
Alkaline Phosphatase: 99 U/L (ref 38–126)
Anion gap: 9 (ref 5–15)
BUN: 6 mg/dL (ref 6–20)
CO2: 27 mmol/L (ref 22–32)
Calcium: 8 mg/dL — ABNORMAL LOW (ref 8.9–10.3)
Chloride: 105 mmol/L (ref 101–111)
Creatinine, Ser: 0.89 mg/dL (ref 0.61–1.24)
GFR calc Af Amer: >60 mL/min
GFR calc non Af Amer: >60 mL/min
Glucose, Bld: 107 mg/dL — ABNORMAL HIGH (ref 65–99)
Potassium: 3.2 mmol/L — ABNORMAL LOW (ref 3.5–5.1)
Sodium: 141 mmol/L (ref 135–145)
Total Bilirubin: 1.2 mg/dL (ref 0.3–1.2)
Total Protein: 5.1 g/dL — ABNORMAL LOW (ref 6.5–8.1)

## 2015-07-13 LAB — LIPASE, BLOOD: Lipase: 18 U/L — ABNORMAL LOW (ref 22–51)

## 2015-07-13 LAB — MAGNESIUM: Magnesium: 1.8 mg/dL (ref 1.7–2.4)

## 2015-07-13 MED ORDER — ACETAMINOPHEN 500 MG PO TABS
1000.0000 mg | ORAL_TABLET | Freq: Three times a day (TID) | ORAL | Status: DC
  Administered 2015-07-13 – 2015-07-14 (×4): 1000 mg via ORAL
  Filled 2015-07-13 (×4): qty 2

## 2015-07-13 MED ORDER — SODIUM CHLORIDE 0.9 % IV SOLN: 250.0000 mL | INTRAVENOUS | Status: DC | PRN

## 2015-07-13 MED ORDER — ASPIRIN 325 MG PO TABS: 325.0000 mg | ORAL_TABLET | Freq: Every day | ORAL | Status: DC

## 2015-07-13 MED ORDER — POTASSIUM CHLORIDE CRYS ER 20 MEQ PO TBCR
40.0000 meq | EXTENDED_RELEASE_TABLET | Freq: Once | ORAL | Status: AC
  Administered 2015-07-13: 40 meq via ORAL
  Filled 2015-07-13: qty 2

## 2015-07-13 MED ORDER — POTASSIUM CHLORIDE IN NACL 40-0.9 MEQ/L-% IV SOLN
INTRAVENOUS | Status: DC
  Filled 2015-07-13 (×3): qty 1000

## 2015-07-13 MED ORDER — MAGNESIUM SULFATE 2 GM/50ML IV SOLN
2.0000 g | Freq: Once | INTRAVENOUS | Status: AC
  Administered 2015-07-13: 2 g via INTRAVENOUS
  Filled 2015-07-13: qty 50

## 2015-07-13 MED ORDER — SODIUM CHLORIDE 0.9 % IJ SOLN: 3.0000 mL | INTRAMUSCULAR | Status: DC | PRN

## 2015-07-13 MED ORDER — SODIUM CHLORIDE 0.9 % IJ SOLN
3.0000 mL | Freq: Two times a day (BID) | INTRAMUSCULAR | Status: DC
  Administered 2015-07-13 (×2): 3 mL via INTRAVENOUS

## 2015-07-13 MED ORDER — METOPROLOL TARTRATE 12.5 MG HALF TABLET
12.5000 mg | ORAL_TABLET | Freq: Two times a day (BID) | ORAL | Status: DC
  Administered 2015-07-13 – 2015-07-14 (×3): 12.5 mg via ORAL
  Filled 2015-07-13 (×4): qty 1

## 2015-07-13 MED ORDER — POLYETHYLENE GLYCOL 3350 17 G PO PACK
17.0000 g | PACK | Freq: Two times a day (BID) | ORAL | Status: DC | PRN
  Filled 2015-07-13: qty 1

## 2015-07-13 NOTE — Progress Notes (Signed)
Pt arrived to unit and placed in 5W37. Pt ambulated to 5W from 3S. A&O x4. Skin assessment completed by 2 RNs.

## 2015-07-13 NOTE — Progress Notes (Signed)
[address] [place], [place] [address] Phone: [phone] FAX: [phone]   Chris Simon [id number] 1934/05/06   Problem List:   Principal Problem:   Sepsis [facility]) Active Problems:   Acute kidney injury [facility])   Choledocholithiasis s/p ERCP 07/09/2015   Paroxysmal atrial fibrillation (HCC)   UTI (lower urinary tract infection)   Acute cholecystitis with chronic cholecystitis s/p lap chole 07/11/2015   2 Days Post-Op  07/11/2015  Preoperative diagnosis: cholangitis s/p ercp, gallstones Postoperative diagnosis: acute on chronic cholecystitis,  cholangitis s/p ercp, gallstones Procedure: laparoscopic cholecystectomy (no IOC) , primary umbilical hernia repair Surgeon: Dr Serita Grammes His gallbladder was very inflamed  Assessment  OK  Plan:  -slight inc LFTs - resolving - follow -adv diet  -wean IVF -IVF ABx x 5 d postop - cont IV for now -Afib w RVR - rate control per primary service.  OK to restart ASA -ARF resolved - wean IVF as tolerated w PRN IVF boluses -VTE prophylaxis- SCDs, etc -mobilize as tolerated to help recovery -transfer to telemetry soon? - per medicine primary service  I updated the status of the patient to the pt, patient's daughter & ICU RN.  I made recommendations.  I answered questions.  Understanding & appreciation was expressed.  Chris Simon, M.D., F.A.C.S. Gastrointestinal and Minimally Invasive Surgery [facility] [address], [place], [place] [address] [phone] Main / Paging   07/13/2015  Subjective:  Denies pain Daughter in room ICU RN just outside door Sitting up  Tol clears  Objective:  Vital signs:  Filed Vitals:   07/13/15 0014 07/13/15 0500 07/13/15 0515 07/13/15 0800  BP: 153/80  157/81 140/72  Pulse: 104  103   Temp: 98.5 F (36.9 C)  98.4 F (36.9 C) 97.8 F (36.6 C)  TempSrc: Oral  Oral   Resp: 18   19   Height:      Weight:  87.2 kg (192 lb 3.9 oz)    SpO2: 93%  94%     Last BM Date: 07/10/15  Intake/Output   Yesterday:  10/15 0701 - 10/16 0700 In: 2624.2 [P.O.:1320; I.V.:1254.2; IV Piggyback:50] Out: 3975 [Urine:3975] This shift:  Total I/O In: -  Out: 750 [Urine:750]  Bowel function:  Flatus: y  BM: n  Drain: n/a  Physical Exam:  General: Pt awake/alert/oriented x4 in mild distress Eyes: PERRL, normal EOM.  Sclera clear.  No icterus Neuro: CN II-XII intact w/o focal sensory/motor deficits. Lymph: No head/neck/groin lymphadenopathy Psych:  No delerium/psychosis/paranoia HENT: Normocephalic, Mucus membranes moist.  No thrush Neck: Supple, No tracheal deviation Chest: No chest wall pain w good excursion CV:  Pulses intact.  Irregular rhythm.  HR 100-110's MS: Normal AROM mjr joints.  No obvious deformity Abdomen: Soft.  Nondistended.  Mildly tender at incisions only.  No evidence of peritonitis.  No incarcerated hernias. Ext:  SCDs BLE.  No mjr edema.  No cyanosis Skin: No petechiae / purpura  Results:   Labs: Results for orders placed or performed during the hospital encounter of 07/08/15 (from the past 48 hour(s))  Protime-INR     Status: None   Collection Time: 07/11/15  2:53 PM  Result Value Ref Range   Prothrombin Time 14.6 11.6 - 15.2 seconds   INR 1.13 0.00 - 1.49  MRSA PCR Screening     Status: None   Collection Time: 07/11/15  3:35 PM  Result Value Ref Range  MRSA by PCR NEGATIVE NEGATIVE    Comment:        The GeneXpert MRSA Assay (FDA approved for NASAL specimens only), is one component of a comprehensive MRSA colonization surveillance program. It is not intended to diagnose MRSA infection nor to guide or monitor treatment for MRSA infections.   Comprehensive metabolic panel     Status: Abnormal   Collection Time: 07/11/15  6:01 PM  Result Value Ref Range   Sodium 141 135 - 145 mmol/L   Potassium 3.8 3.5 - 5.1 mmol/L   Chloride 109  101 - 111 mmol/L   CO2 27 22 - 32 mmol/L   Glucose, Bld 95 65 - 99 mg/dL   BUN 8 6 - 20 mg/dL   Creatinine, Ser 0.89 0.61 - 1.24 mg/dL   Calcium 7.7 (L) 8.9 - 10.3 mg/dL   Total Protein 5.1 (L) 6.5 - 8.1 g/dL   Albumin 2.3 (L) 3.5 - 5.0 g/dL   AST 28 15 - 41 U/L   ALT 39 17 - 63 U/L   Alkaline Phosphatase 90 38 - 126 U/L   Total Bilirubin 1.4 (H) 0.3 - 1.2 mg/dL   GFR calc non Af Amer >60 >60 mL/min   GFR calc Af Amer >60 >60 mL/min    Comment: (NOTE) The eGFR has been calculated using the CKD EPI equation. This calculation has not been validated in all clinical situations. eGFR's persistently <60 mL/min signify possible Chronic Kidney Disease.    Anion gap 5 5 - 15  Hemoglobin A1c     Status: None   Collection Time: 07/11/15  6:01 PM  Result Value Ref Range   Hgb A1c MFr Bld 5.6 4.8 - 5.6 %    Comment: (NOTE)         Pre-diabetes: 5.7 - 6.4         Diabetes: >6.4         Glycemic control for adults with diabetes: <7.0    Mean Plasma Glucose 114 mg/dL    Comment: (NOTE) Performed At: [facility] [place], [place] [id number] Lindon Romp MD [id number]   CBC     Status: Abnormal   Collection Time: 07/12/15  5:45 AM  Result Value Ref Range   WBC 10.8 (H) 4.0 - 10.5 K/uL   RBC 4.01 (L) 4.22 - 5.81 MIL/uL   Hemoglobin 12.7 (L) 13.0 - 17.0 g/dL   HCT 38.9 (L) 39.0 - 52.0 %   MCV 97.0 78.0 - 100.0 fL   MCH 31.7 26.0 - 34.0 pg   MCHC 32.6 30.0 - 36.0 g/dL   RDW 13.7 11.5 - 15.5 %   Platelets 143 (L) 150 - 400 K/uL  Comprehensive metabolic panel     Status: Abnormal   Collection Time: 07/12/15  5:45 AM  Result Value Ref Range   Sodium 139 135 - 145 mmol/L   Potassium 3.5 3.5 - 5.1 mmol/L   Chloride 103 101 - 111 mmol/L   CO2 28 22 - 32 mmol/L   Glucose, Bld 98 65 - 99 mg/dL   BUN 7 6 - 20 mg/dL   Creatinine, Ser 0.99 0.61 - 1.24 mg/dL   Calcium 7.6 (L) 8.9 - 10.3 mg/dL   Total Protein 5.0 (L) 6.5 - 8.1 g/dL   Albumin 2.2 (L) 3.5 - 5.0  g/dL   AST 25 15 - 41 U/L   ALT 37 17 - 63 U/L   Alkaline Phosphatase 96 38 - 126 U/L  Total Bilirubin 1.6 (H) 0.3 - 1.2 mg/dL   GFR calc non Af Amer >60 >60 mL/min   GFR calc Af Amer >60 >60 mL/min    Comment: (NOTE) The eGFR has been calculated using the CKD EPI equation. This calculation has not been validated in all clinical situations. eGFR's persistently <60 mL/min signify possible Chronic Kidney Disease.    Anion gap 8 5 - 15  CBC     Status: Abnormal   Collection Time: 07/13/15  2:49 AM  Result Value Ref Range   WBC 9.0 4.0 - 10.5 K/uL   RBC 4.15 (L) 4.22 - 5.81 MIL/uL   Hemoglobin 12.9 (L) 13.0 - 17.0 g/dL   HCT 39.0 39.0 - 52.0 %   MCV 94.0 78.0 - 100.0 fL   MCH 31.1 26.0 - 34.0 pg   MCHC 33.1 30.0 - 36.0 g/dL   RDW 13.3 11.5 - 15.5 %   Platelets 160 150 - 400 K/uL  Comprehensive metabolic panel     Status: Abnormal   Collection Time: 07/13/15  2:49 AM  Result Value Ref Range   Sodium 141 135 - 145 mmol/L   Potassium 3.2 (L) 3.5 - 5.1 mmol/L   Chloride 105 101 - 111 mmol/L   CO2 27 22 - 32 mmol/L   Glucose, Bld 107 (H) 65 - 99 mg/dL   BUN 6 6 - 20 mg/dL   Creatinine, Ser 0.89 0.61 - 1.24 mg/dL   Calcium 8.0 (L) 8.9 - 10.3 mg/dL   Total Protein 5.1 (L) 6.5 - 8.1 g/dL   Albumin 2.2 (L) 3.5 - 5.0 g/dL   AST 26 15 - 41 U/L   ALT 34 17 - 63 U/L   Alkaline Phosphatase 99 38 - 126 U/L   Total Bilirubin 1.2 0.3 - 1.2 mg/dL   GFR calc non Af Amer >60 >60 mL/min   GFR calc Af Amer >60 >60 mL/min    Comment: (NOTE) The eGFR has been calculated using the CKD EPI equation. This calculation has not been validated in all clinical situations. eGFR's persistently <60 mL/min signify possible Chronic Kidney Disease.    Anion gap 9 5 - 15  Lipase, blood     Status: Abnormal   Collection Time: 07/13/15  2:49 AM  Result Value Ref Range   Lipase 18 (L) 22 - 51 U/L    Imaging / Studies: No results found.  Medications / Allergies: per  chart  Antibiotics: Anti-infectives    Start     Dose/Rate Route Frequency Ordered Stop   07/12/15 1130  cefTRIAXone (ROCEPHIN) 1 g in dextrose 5 % 50 mL IVPB     1 g 100 mL/hr over 30 Minutes Intravenous Every 24 hours 07/12/15 1119     07/08/15 2130  piperacillin-tazobactam (ZOSYN) IVPB 3.375 g  Status:  Discontinued     3.375 g 12.5 mL/hr over 240 Minutes Intravenous Every 8 hours 07/08/15 2104 07/12/15 1119        Note: Portions of this report may have been transcribed using voice recognition software. Every effort was made to ensure accuracy; however, inadvertent computerized transcription errors may be present.   Any transcriptional errors that result from this process are unintentional.     Chris Simon, M.D., F.A.C.S. Gastrointestinal and Minimally Invasive Surgery [facility] [address] [place], [place] [address] [phone] Main / Paging   07/13/2015  CARE TEAM:  PCP: [name], MD  Outpatient Care Team: Patient Care  Team: [name], MD as PCP - General (Family Medicine)  Inpatient Treatment Team: Treatment Team: Attending Provider: Reyne Dumas, MD; Consulting Physician: Nolon Nations, MD; Rounding Team: Trenton Gammon, MD; Technician: Meredith Staggers, NT; Registered Nurse: Lazarus Gowda, RN; Consulting Physician: Teena Irani, MD; Registered Nurse: Kathie Dike, RN

## 2015-07-13 NOTE — Progress Notes (Signed)
[facility] PROGRESS NOTE  Chris Simon PRF:[id number] DOB: March 24, 1934 DOA: 07/08/2015 PCP: [name], MD  Length of stay: 5   Assessment/Plan: Principal Problem:   Sepsis ([place]) Active Problems:   Acute kidney injury ([place])   Choledocholithiasis s/p ERCP 07/09/2015   Paroxysmal atrial fibrillation (H[facility])   UTI (lower urinary tract infection)   Acute cholecystitis with chronic cholecystitis s/p lap chole 07/11/2015    Sepsis secondary to UTI/acute cholangitis/gram-negative bacteremia secondary to Escherichia coli Change Zosyn to Rocephin, discussed with infectious disease, continue IV antibiotics as long as the patient is inpatient, will switch patient to Keflex by mouth for 2 more weeks at discharge laparoscopic cholecystectomy , primary umbilical hernia repair on 10/14 Continue to advance type of surgery   Acute metabolic encephalopathy due to sepsis secondary to gram-negative bacteremia, minimize sedation continue to treat underlying infection  Severe sepsis due to UTI/cholangitis, improving with IV antibiotics  Acute kidney injury improving with IV fluids, wean IV fluids, DC fluids when the patient is able to eat and advance his diet. Creatinine 0.89 down from 1.32  Hematuria likely secondary to Foley trauma, CBC stable, hematuria resolved  Atrial fibrillation with rapid ventricular response patient continues to be tachycardic postoperatively, continue amiodarone, iv lopressor prn , restart aspirin okay with surgery, not on anticoagulation, followed by cardiologist in [place]. Start patient on Lopressor 12.5 twice a day  Nonobstructing renal stone/enlarged prostrate with bladder outlet obstruction-continue Foley catheter will need outpatient urology examination, continue Flomax/finasteride    Hyperglycemia-continue SSI, hemoglobin A1c 5.6  Dyslipidemia-hold Crestor  DVT prophylaxsis heparin  Code Status:      Code Status Orders         Start     Ordered   07/08/15 2051  Full code   Continuous     07/08/15 2059     Family Communication: family updated about patient's clinical progress Disposition Plan: Transfer to telemetry   Brief narrative: 81 y.o. male.  HPI: This gentleman presents transferred from [facility]. He presented there with a one-day history of back pain, chills, rigor, and confusion. He was also found to be hypotensive. He has a known history of gallstones. He has had a CT scan with renal protocol showing a dilated gallbladder with periportal edema, dilated intra-and extra hepatic bile ducts, gallstones, and distal common bile duct stones. He was also found to have elevated liver function tests. Currently, he is still slightly confused. His family is at bedside. He reports minimal abdominal discomfort at this point. He denied any vomiting. Bowel movements have been normal.  Consultants:  General surgery  Gastroenterology    Procedures: ERCP-Multiple large CBD and intrahepatic stones status post removed as above with sphincterotomy and balloon catheter 4. No obvious pancreatic duct injection or wire  advancement 5. No cystic duct filling  Antibiotics: Anti-infectives    Start     Dose/Rate Route Frequency Ordered Stop   07/12/15 1130  cefTRIAXone (ROCEPHIN) 1 g in dextrose 5 % 50 mL IVPB     1 g 100 mL/hr over 30 Minutes Intravenous Every 24 hours 07/12/15 1119 07/16/15 0922   07/08/15 2130  piperacillin-tazobactam (ZOSYN) IVPB 3.375 g  Status:  Discontinued     3.375 g 12.5 mL/hr over 240 Minutes Intravenous Every 8 hours 07/08/15 2104 07/12/15 1119         HPI/Subjective: Patient is still tachycardic but appears to be comfortable in no acute distress  Objective: Filed Vitals:   07/13/15 0014  07/13/15 0500 07/13/15 0515 07/13/15 0800  BP: 153/80  157/81 140/72  Pulse: 104  103   Temp: 98.5 F (36.9 C)  98.4 F (36.9 C) 97.8 F (36.6 C)  TempSrc: Oral  Oral   Resp: 18  19    Height:      Weight:  87.2 kg (192 lb 3.9 oz)    SpO2: 93%  94%     Intake/Output Summary (Last 24 hours) at 07/13/15 0932 Last data filed at 07/13/15 1607  Gross per 24 hour  Intake 1935.83 ml  Output   4725 ml  Net -2789.17 ml    Exam: General: Somnolent Neuro:  Non-focal HEENT: [place]/AT, PERRL, no appreciable JVD Cardiovascular: RRR, no MRG Lungs: Clear bilateral breath sounds Abdomen: Soft, non-tender, non-distended Musculoskeletal: No acute deformity or ROM limitation Skin: Grossly intact  Data Review   Micro Results Recent Results (from the past 240 hour(s))  Blood Culture (routine x 2)     Status: None   Collection Time: 07/08/15  4:51 PM  Result Value Ref Range Status   Specimen Description BLOOD RIGHT ARM  Final   Special Requests BOTTLES DRAWN AEROBIC AND ANAEROBIC 6CC  Final   Culture  Setup Time   Final    GRAM NEGATIVE RODS IN BOTH AEROBIC AND ANAEROBIC BOTTLES CRITICAL RESULT CALLED TO, READ BACK BY AND VERIFIED WITH: LEA FERGUSON @0426  07/09/15.Marland KitchenMarland KitchenAJO CONFIRMED BY DV    Culture ESCHERICHIA COLI  Final   Report Status 07/11/2015 FINAL  Final   Organism ID, Bacteria ESCHERICHIA COLI  Final      Susceptibility   Escherichia coli - MIC*    AMPICILLIN >=32 RESISTANT Resistant     CEFTAZIDIME <=1 SENSITIVE Sensitive     CEFAZOLIN <=4 SENSITIVE Sensitive     CEFTRIAXONE <=1 SENSITIVE Sensitive     CIPROFLOXACIN >=4 RESISTANT Resistant     GENTAMICIN <=1 SENSITIVE Sensitive     IMIPENEM <=0.25 SENSITIVE Sensitive     TRIMETH/SULFA <=20 SENSITIVE Sensitive     * ESCHERICHIA COLI  Blood Culture (routine x 2)     Status: None   Collection Time: 07/08/15  4:56 PM  Result Value Ref Range Status   Specimen Description BLOOD RIGHT HAND  Final   Special Requests BOTTLES DRAWN AEROBIC AND ANAEROBIC 6CC  Final   Culture  Setup Time   Final    GRAM NEGATIVE RODS IN BOTH AEROBIC AND ANAEROBIC BOTTLES CRITICAL RESULT CALLED TO, READ BACK BY AND VERIFIED  WITH: LEA FERGUSON @0426  07/09/15.Marland KitchenMarland KitchenAJO    Culture ESCHERICHIA COLI  Final   Report Status 07/11/2015 FINAL  Final   Organism ID, Bacteria ESCHERICHIA COLI  Final      Susceptibility   Escherichia coli - MIC*    AMPICILLIN >=32 RESISTANT Resistant     CEFTAZIDIME <=1 SENSITIVE Sensitive     CEFAZOLIN <=4 SENSITIVE Sensitive     CEFTRIAXONE <=1 SENSITIVE Sensitive     CIPROFLOXACIN >=4 RESISTANT Resistant     GENTAMICIN <=1 SENSITIVE Sensitive     IMIPENEM <=0.25 SENSITIVE Sensitive     TRIMETH/SULFA <=20 SENSITIVE Sensitive     * ESCHERICHIA COLI  Urine culture     Status: None   Collection Time: 07/08/15  4:57 PM  Result Value Ref Range Status   Specimen Description URINE, RANDOM  Final   Special Requests NONE  Final   Culture >=100,000 COLONIES/mL ESCHERICHIA COLI  Final   Report Status 07/10/2015 FINAL  Final   Organism ID, Bacteria ESCHERICHIA  COLI  Final      Susceptibility   Escherichia coli - MIC*    AMPICILLIN >=32 RESISTANT Resistant     CEFTAZIDIME <=1 SENSITIVE Sensitive     CEFAZOLIN <=4 SENSITIVE Sensitive     CEFTRIAXONE <=1 SENSITIVE Sensitive     CIPROFLOXACIN >=4 RESISTANT Resistant     GENTAMICIN <=1 SENSITIVE Sensitive     IMIPENEM <=0.25 SENSITIVE Sensitive     TRIMETH/SULFA <=20 SENSITIVE Sensitive     NITROFURANTOIN Value in next row Sensitive      SENSITIVE<=16    PIP/TAZO Value in next row Sensitive      SENSITIVE<=4    ERTAPENEM Value in next row Sensitive      SENSITIVE<=0.5    LEVOFLOXACIN Value in next row Resistant      RESISTANT>=8    * >=100,000 COLONIES/mL ESCHERICHIA COLI  MRSA PCR Screening     Status: None   Collection Time: 07/08/15  8:26 PM  Result Value Ref Range Status   MRSA by PCR NEGATIVE NEGATIVE Final    Comment:        The GeneXpert MRSA Assay (FDA approved for NASAL specimens only), is one component of a comprehensive MRSA colonization surveillance program. It is not intended to diagnose MRSA infection nor to guide  or monitor treatment for MRSA infections.   Culture, blood (routine x 2)     Status: None (Preliminary result)   Collection Time: 07/08/15  9:05 PM  Result Value Ref Range Status   Specimen Description BLOOD LEFT ARM  Final   Special Requests BOTTLES DRAWN AEROBIC AND ANAEROBIC 5CC  Final   Culture NO GROWTH 4 DAYS  Final   Report Status PENDING  Incomplete  Culture, blood (routine x 2)     Status: None (Preliminary result)   Collection Time: 07/08/15  9:48 PM  Result Value Ref Range Status   Specimen Description BLOOD RIGHT ARM  Final   Special Requests BOTTLES DRAWN AEROBIC AND ANAEROBIC 5CC  Final   Culture NO GROWTH 4 DAYS  Final   Report Status PENDING  Incomplete  Urine culture     Status: None   Collection Time: 07/08/15 10:56 PM  Result Value Ref Range Status   Specimen Description URINE, CLEAN CATCH  Final   Special Requests NONE  Final   Culture NO GROWTH 2 DAYS  Final   Report Status 07/10/2015 FINAL  Final  Surgical pcr screen     Status: None   Collection Time: 07/11/15  6:25 AM  Result Value Ref Range Status   MRSA, PCR NEGATIVE NEGATIVE Final   Staphylococcus aureus NEGATIVE NEGATIVE Final    Comment:        The Xpert SA Assay (FDA approved for NASAL specimens in patients over 52 years of age), is one component of a comprehensive surveillance program.  Test performance has been validated by [facility] for patients greater than or equal to 88 year old. It is not intended to diagnose infection nor to guide or monitor treatment.   MRSA PCR Screening     Status: None   Collection Time: 07/11/15  3:35 PM  Result Value Ref Range Status   MRSA by PCR NEGATIVE NEGATIVE Final    Comment:        The GeneXpert MRSA Assay (FDA approved for NASAL specimens only), is one component of a comprehensive MRSA colonization surveillance program. It is not intended to diagnose MRSA infection nor to guide or monitor treatment for MRSA infections.  Radiology  Reports Dg Lumbar Spine 2-3 Views  07/08/2015  CLINICAL DATA:  Acute low back pain after fall several days ago. EXAM: LUMBAR SPINE - 2-3 VIEW COMPARISON:  None. FINDINGS: No fracture or spondylolisthesis is noted. Mild degenerative disc disease is noted at L1-2, L2-3 and L5-S1 with mild anterior osteophyte formation at these levels. Atherosclerosis of abdominal aorta is noted. IMPRESSION: Mild multilevel degenerative disc disease is noted. No acute abnormality seen in the lumbar spine. Electronically Signed   By: Marijo Conception, M.D.   On: 07/08/2015 08:13   Dg Chest Port 1 View  07/08/2015  CLINICAL DATA:  Fall 2 days ago with persistent back pain EXAM: PORTABLE CHEST - 1 VIEW COMPARISON:  06/21/2014 FINDINGS: Cardiac shadow is stable. The lungs are well aerated bilaterally. No focal infiltrate, effusion or pneumothorax is seen. No bony abnormality is noted. IMPRESSION: No active disease. Electronically Signed   By: Inez Catalina M.D.   On: 07/08/2015 18:18   Dg Ercp Biliary & Pancreatic Ducts  07/09/2015  CLINICAL DATA:  Cholelithiasis EXAM: ERCP TECHNIQUE: Multiple spot images obtained with the fluoroscopic device and submitted for interpretation post-procedure. FLUOROSCOPY TIME:  5 min 48 seconds COMPARISON:  CT 07/08/2015 FINDINGS: Three intraoperative fluoroscopic spot images document endoscopic cannulation and opacification of the CBD. Multiple filling defects in the mid and distal CBD. Incomplete opacification of the intrahepatic bile ducts, which appear dilated centrally. Final image of the series Demonstrates passage of a balloon catheter through the CBD. IMPRESSION: 1. Choledocholithiasis with endoscopic intervention These images were submitted for radiologic interpretation only. Please see the procedural report for the amount of contrast and the fluoroscopy time utilized. Electronically Signed   By: Lucrezia Europe M.D.   On: 07/09/2015 13:37   Ct Renal Stone Study  07/08/2015  CLINICAL DATA:   Left flank pain and hematuria EXAM: CT ABDOMEN AND PELVIS WITHOUT CONTRAST TECHNIQUE: Multidetector CT imaging of the abdomen and pelvis was performed following the standard protocol without IV contrast. COMPARISON:  None. FINDINGS: Lung bases are free of acute infiltrate or sizable effusion. The liver is homogeneous in attenuation without focal mass. Central periportal edema is identified as well as some dilatation of the intra and extrahepatic biliary tree. Multiple gallstones are noted within a distended gallbladder. Additionally, there are multiple calculi in the distal aspect of the common bile duct causing the dilatation. The spleen, adrenal glands and pancreas are otherwise within normal limits. The kidneys are well visualized bilaterally with nonobstructing renal calculus on the left measuring 4-5 mm. Additionally a cystic lesion is noted from the upper pole of the right kidney. Diffuse aortoiliac calcifications are noted. Mild dilatation to 3.4 cm is noted. The appendix is within normal limits. There are diverticular changes in the colon although no findings to suggest diverticulitis are seen. A small left inguinal hernia is noted containing a portion of the sigmoid colon although no obstructive changes are seen. The prostate is significantly enlarged measuring 8.8 x 7.6 cm. On the left lateral aspect of the prostate it appears distorted and the possibility of an underlying mass cannot be totally excluded. No definitive pelvic adenopathy is seen. The bladder demonstrates some increased trabeculation likely related to bladder outlet syndrome. IMPRESSION: Enlarged prostate with changes suggestive of bladder outlet obstruction. The prostate is irregular particularly on its left lateral aspect which may represent an underlying mass lesion. Clinical correlation with the physical exam is recommended. Small left inguinal hernia containing a loop of nonobstructed sigmoid colon. Cholelithiasis as  well as evidence of  intrahepatic and extrahepatic biliary ductal dilatation. There are at multiple calculi within the distal common bile duct. Nonobstructing left renal stone. Diverticulosis without diverticulitis. Electronically Signed   By: Inez Catalina M.D.   On: 07/08/2015 17:43     CBC  Recent Labs Lab 07/08/15 1657 07/08/15 2148 07/09/15 0214 07/10/15 0640 07/12/15 0545 07/13/15 0249  WBC 16.3* 15.6* 25.9* 17.3* 10.8* 9.0  HGB 14.7 13.5 12.7* 12.3* 12.7* 12.9*  HCT 44.1 40.8 38.7* 37.2* 38.9* 39.0  PLT 166 121* 115* 111* 143* 160  MCV 94.3 94.2 94.6 96.4 97.0 94.0  MCH 31.5 31.2 31.1 31.9 31.7 31.1  MCHC 33.4 33.1 32.8 33.1 32.6 33.1  RDW 13.7 13.4 13.6 14.4 13.7 13.3  LYMPHSABS 0.4* 0.3*  --  0.8  --   --   MONOABS 0.3 0.9  --  0.8  --   --   EOSABS 0.0 0.0  --  0.0  --   --   BASOSABS 0.0 0.0  --  0.0  --   --     Chemistries   Recent Labs Lab 07/08/15 2148 07/09/15 0214 07/10/15 0640 07/11/15 1801 07/12/15 0545 07/13/15 0249  NA 134* 139 142 141 139 141  K 3.3* 3.3* 3.6 3.8 3.5 3.2*  CL 102 105 107 109 103 105  CO2 21* 21* 26 27 28 27   GLUCOSE 139* 122* 89 95 98 107*  BUN 21* 23* 17 8 7 6   CREATININE 1.27* 1.32* 1.09 0.89 0.99 0.89  CALCIUM 8.0* 8.3* 8.0* 7.7* 7.6* 8.0*  MG 1.4* 1.6*  --   --   --   --   AST  --  90* 44* 28 25 26   ALT  --  83* 56 39 37 34  ALKPHOS  --  74 82 90 96 99  BILITOT  --  4.5* 2.1* 1.4* 1.6* 1.2   ------------------------------------------------------------------------------------------------------------------ estimated creatinine clearance is 73.9 mL/min (by C-G formula based on Cr of 0.89). ------------------------------------------------------------------------------------------------------------------  Recent Labs  07/11/15 1801  HGBA1C 5.6   ------------------------------------------------------------------------------------------------------------------ No results for input(s): CHOL, HDL, LDLCALC, TRIG, CHOLHDL, LDLDIRECT in the last  72 hours. ------------------------------------------------------------------------------------------------------------------ No results for input(s): TSH, T4TOTAL, T3FREE, THYROIDAB in the last 72 hours.  Invalid input(s): FREET3 ------------------------------------------------------------------------------------------------------------------ No results for input(s): VITAMINB12, FOLATE, FERRITIN, TIBC, IRON, RETICCTPCT in the last 72 hours.  Coagulation profile  Recent Labs Lab 07/08/15 1657 07/08/15 2148 07/11/15 1453  INR 1.13 1.42 1.13    No results for input(s): DDIMER in the last 72 hours.  Cardiac Enzymes  Recent Labs Lab 07/08/15 1657  TROPONINI <0.03   ------------------------------------------------------------------------------------------------------------------ Invalid input(s): POCBNP   CBG:  Recent Labs Lab 07/09/15 0743 07/09/15 2148  GLUCAP 102* 97       Studies: No results found.    Lab Results  Component Value Date   HGBA1C 5.6 07/11/2015   HGBA1C 5.8 06/22/2014   Lab Results  Component Value Date   CREATININE 0.89 07/13/2015       Scheduled Meds: . acetaminophen  1,000 mg Oral TID  . amiodarone  200 mg Oral Daily  . aspirin EC  325 mg Oral Daily  . cefTRIAXone (ROCEPHIN)  IV  1 g Intravenous Q24H  . famotidine  20 mg Oral BID  . finasteride  5 mg Oral Daily  . heparin  5,000 Units Subcutaneous 3 times per day  . lip balm   Topical BID  . multivitamin with minerals  1 tablet Oral Daily  .  polyethylene glycol  17 g Oral BID  . rosuvastatin  10 mg Oral Daily  . saccharomyces boulardii  250 mg Oral BID  . sodium chloride  3 mL Intravenous Q12H  . tamsulosin  0.4 mg Oral Daily   Continuous Infusions: . 0.9 % NaCl with KCl 40 mEq / L 75 mL/hr (07/13/15 0807)    Principal Problem:   Sepsis ([place]) Active Problems:   Acute kidney injury ([place])   Choledocholithiasis s/p ERCP 07/09/2015   Paroxysmal atrial fibrillation ([facility])    UTI (lower urinary tract infection)   Acute cholecystitis with chronic cholecystitis s/p lap chole 07/11/2015    Time spent: 70 minutes   [facility] Pager [phone]. If 7PM-7AM, please contact night-coverage at [email or web address], password [facility] 07/13/2015, 9:32 AM  LOS: 5 days

## 2015-07-13 NOTE — Progress Notes (Signed)
Pt to [place] to [id number], called report, family present & aware of [place].

## 2015-07-14 ENCOUNTER — Encounter (HOSPITAL_COMMUNITY): Payer: Self-pay | Admitting: General Surgery

## 2015-07-14 DIAGNOSIS — K810 Acute cholecystitis: Secondary | ICD-10-CM

## 2015-07-14 MED ORDER — SACCHAROMYCES BOULARDII 250 MG PO CAPS: 250.0000 mg | ORAL_CAPSULE | Freq: Two times a day (BID) | ORAL | Status: DC

## 2015-07-14 MED ORDER — OXYCODONE HCL 5 MG PO CAPS: 5.0000 mg | ORAL_CAPSULE | ORAL | Status: DC | PRN

## 2015-07-14 MED ORDER — ONDANSETRON HCL 4 MG PO TABS: 4.0000 mg | ORAL_TABLET | Freq: Four times a day (QID) | ORAL | Status: DC | PRN

## 2015-07-14 MED ORDER — CEPHALEXIN 500 MG PO CAPS
500.0000 mg | ORAL_CAPSULE | Freq: Four times a day (QID) | ORAL | Status: AC
Start: 2015-07-14 — End: 2015-07-28

## 2015-07-14 MED ORDER — DILTIAZEM HCL ER COATED BEADS 180 MG PO CP24: 180.0000 mg | ORAL_CAPSULE | Freq: Every day | ORAL | Status: DC

## 2015-07-14 NOTE — Progress Notes (Signed)
Postvoid residual 139ml

## 2015-07-14 NOTE — Progress Notes (Signed)
NURSING PROGRESS NOTE  Chris Simon [id number] Discharge Data: 07/14/2015 4:20 PM Attending Provider: Reyne Dumas, MD EXB:[id number], Chrissie Noa, MD     Liborio Nixon discharged Home per MD order.  Discussed with the patient the After Visit Summary and all questions fully answered. All IV's discontinued with no bleeding noted. All belongings returned to patient for patient to take home.   Last Vital Signs:  Blood pressure 134/70, pulse 65, temperature 98.1 F (36.7 C), temperature source Oral, resp. rate 18, height 6' (1.829 m), weight 90.7 kg (199 lb 15.3 oz), SpO2 95 %.  Discharge Medication List   Medication List    STOP taking these medications        nitrofurantoin (macrocrystal-monohydrate) 100 MG capsule  Commonly known as:  MACROBID     rosuvastatin 10 MG tablet  Commonly known as:  CRESTOR      TAKE these medications        amiodarone 200 MG tablet  Commonly known as:  PACERONE  Take 200 mg by mouth daily.     aspirin EC 325 MG tablet  Take 325 mg by mouth daily.     cephALEXin 500 MG capsule  Commonly known as:  KEFLEX  Take 1 capsule (500 mg total) by mouth 4 (four) times daily.     diltiazem 180 MG 24 hr capsule  Commonly known as:  CARDIZEM CD  Take 1 capsule (180 mg total) by mouth daily.     finasteride 5 MG tablet  Commonly known as:  PROSCAR  Take 5 mg by mouth daily.     multivitamin with minerals Tabs tablet  Take 1 tablet by mouth daily.     ondansetron 4 MG tablet  Commonly known as:  ZOFRAN  Take 1 tablet (4 mg total) by mouth every 6 (six) hours as needed for nausea or vomiting.     oxycodone 5 MG capsule  Commonly known as:  OXY-IR  Take 1 capsule (5 mg total) by mouth every 4 (four) hours as needed.     saccharomyces boulardii 250 MG capsule  Commonly known as:  FLORASTOR  Take 1 capsule (250 mg total) by mouth 2 (two) times daily.     tamsulosin 0.4 MG Caps capsule  Commonly known as:  FLOMAX  Take 0.4 mg by mouth daily.          Doristine Devoid, RN

## 2015-07-14 NOTE — Plan of Care (Signed)
Problem: Acute Rehab PT Goals(only PT should resolve) Goal: Pt Will Go Supine/Side To Sit With level bed Goal: Pt Will Transfer Bed To Chair/Chair To Bed With no elevation on bed

## 2015-07-14 NOTE — Discharge Instructions (Signed)
[facility] ______[facility] °LAPAROSCOPIC SURGERY: POST OP INSTRUCTIONS °Always review your discharge instruction sheet given to you by the facility where your surgery was performed. °IF YOU HAVE DISABILITY OR FAMILY LEAVE FORMS, YOU MUST BRING THEM TO THE OFFICE FOR PROCESSING.   °DO NOT GIVE THEM TO YOUR DOCTOR. ° °1. A prescription for pain medication may be given to you upon discharge.  Take your pain medication as prescribed, if needed.  If narcotic pain medicine is not needed, then you may take acetaminophen (Tylenol) or ibuprofen (Advil) as needed. °2. Take your usually prescribed medications unless otherwise directed. °3. If you need a refill on your pain medication, please contact your pharmacy.  They will contact our office to request authorization. Prescriptions will not be filled after 5pm or on week-ends. °4. You should follow a light diet the first few days after arrival home, such as soup and crackers, etc.  Be sure to include lots of fluids daily. °5. Most patients will experience some swelling and bruising in the area of the incisions.  Ice packs will help.  Swelling and bruising can take several days to resolve.  °6. It is common to experience some constipation if taking pain medication after surgery.  Increasing fluid intake and taking a stool softener (such as Colace) will usually help or prevent this problem from occurring.  A mild laxative (Milk of Magnesia or Miralax) should be taken according to package instructions if there are no bowel movements after 48 hours. °7. Unless discharge instructions indicate otherwise, you may remove your bandages 24-48 hours after surgery, and you may shower at that time.  You may have steri-strips (small skin tapes) in place directly over the incision.  These strips should be left on the skin for 7-10 days.  If your surgeon used skin glue on the incision, you may shower in 24 hours.  The glue will flake off over the next 2-3 weeks.  Any sutures or  staples will be removed at the office during your follow-up visit. °8. ACTIVITIES:  You may resume regular (light) daily activities beginning the next day--such as daily self-care, walking, climbing stairs--gradually increasing activities as tolerated.  You may have sexual intercourse when it is comfortable.  Refrain from any heavy lifting or straining until approved by your doctor. °a. You may drive when you are no longer taking prescription pain medication, you can comfortably wear a seatbelt, and you can safely maneuver your car and apply brakes. °b. RETURN TO WORK:  __________________________________________________________ °9. You should see your doctor in the office for a follow-up appointment approximately 2-3 weeks after your surgery.  Make sure that you call for this appointment within a day or two after you arrive home to insure a convenient appointment time. °10. OTHER INSTRUCTIONS: __________________________________________________________________________________________________________________________ __________________________________________________________________________________________________________________________ °WHEN TO CALL YOUR DOCTOR: °1. Fever over 101.0 °2. Inability to urinate °3. Continued bleeding from incision. °4. Increased pain, redness, or drainage from the incision. °5. Increasing abdominal pain ° °The clinic staff is available to answer your questions during regular business hours.  Please don’t hesitate to call and ask to speak to one of the nurses for clinical concerns.  If you have a medical emergency, go to the nearest emergency room or call 911.  A surgeon from [facility] is always on call at the hospital. °[address], [address], [place], [place]  [address] ? [address] [address], [place], [place]   [address] °[phone] ? [phone] ? FAX [phone] °Web site:   [email or web address] °

## 2015-07-14 NOTE — Progress Notes (Signed)
Patient ID: Chris Simon, male   DOB: 1934/06/14, 81 y.o.   MRN: [id number] 3 Days Post-Op  Subjective: Mental status is much improved.  Doing well.  Ate of all his breakfast this morning.  Pain is well controlled  Objective: Vital signs in last 24 hours: Temp:  [97.9 F (36.6 C)-98.1 F (36.7 C)] 98 F (36.7 C) (10/17 0535) Pulse Rate:  [69-76] 76 (10/17 0535) Resp:  [18-20] 18 (10/17 0535) BP: (116-144)/(62-89) 144/74 mmHg (10/17 0535) SpO2:  [94 %-98 %] 94 % (10/17 0535) Weight:  [90.7 kg (199 lb 15.3 oz)] 90.7 kg (199 lb 15.3 oz) (10/17 0500) Last BM Date: 07/10/15  Intake/Output from previous day: 10/16 0701 - 10/17 0700 In: 266.3 [I.V.:216.3; IV Piggyback:50] Out: 750 [Urine:750] Intake/Output this shift: Total I/O In: 240 [P.O.:240] Out: 700 [Urine:700]  PE: Abd: soft, appropriately tender, incisions c/d/i, +BS, ND  Lab Results:   Recent Labs  07/12/15 0545 07/13/15 0249  WBC 10.8* 9.0  HGB 12.7* 12.9*  HCT 38.9* 39.0  PLT 143* 160   BMET  Recent Labs  07/12/15 0545 07/13/15 0249  NA 139 141  K 3.5 3.2*  CL 103 105  CO2 28 27  GLUCOSE 98 107*  BUN 7 6  CREATININE 0.99 0.89  CALCIUM 7.6* 8.0*   PT/INR  Recent Labs  07/11/15 1453  LABPROT 14.6  INR 1.13   CMP     Component Value Date/Time   NA 141 07/13/2015 0249   NA 143 06/23/2014 0421   K 3.2* 07/13/2015 0249   K 3.6 06/23/2014 0421   CL 105 07/13/2015 0249   CL 112* 06/23/2014 0421   CO2 27 07/13/2015 0249   CO2 25 06/23/2014 0421   GLUCOSE 107* 07/13/2015 0249   GLUCOSE 111* 06/23/2014 0421   BUN 6 07/13/2015 0249   BUN 19* 06/23/2014 0421   CREATININE 0.89 07/13/2015 0249   CREATININE 1.15 06/23/2014 0421   CALCIUM 8.0* 07/13/2015 0249   CALCIUM 7.5* 06/23/2014 0421   PROT 5.1* 07/13/2015 0249   PROT 5.8* 06/24/2014 0428   ALBUMIN 2.2* 07/13/2015 0249   ALBUMIN 2.6* 06/24/2014 0428   AST 26 07/13/2015 0249   AST 28 06/24/2014 0428   ALT 34 07/13/2015 0249   ALT 48  06/24/2014 0428   ALKPHOS 99 07/13/2015 0249   ALKPHOS 140* 06/24/2014 0428   BILITOT 1.2 07/13/2015 0249   BILITOT 1.8* 06/24/2014 0428   GFRNONAA >60 07/13/2015 0249   GFRNONAA >60 06/23/2014 0421   GFRNONAA >60 09/17/2013 1507   GFRAA >60 07/13/2015 0249   GFRAA >60 06/23/2014 0421   GFRAA >60 09/17/2013 1507   Lipase     Component Value Date/Time   LIPASE 18* 07/13/2015 0249       Studies/Results: No results found.  Anti-infectives: Anti-infectives    Start     Dose/Rate Route Frequency Ordered Stop   07/12/15 1130  cefTRIAXone (ROCEPHIN) 1 g in dextrose 5 % 50 mL IVPB     1 g 100 mL/hr over 30 Minutes Intravenous Every 24 hours 07/12/15 1119 07/16/15 0922   07/08/15 2130  piperacillin-tazobactam (ZOSYN) IVPB 3.375 g  Status:  Discontinued     3.375 g 12.5 mL/hr over 240 Minutes Intravenous Every 8 hours 07/08/15 2104 07/12/15 1119       Assessment/Plan  POD 3, s/p lap chole  -s/p ERCP for cholangitis -doing well.  Tolerating a solid diet and stable surgically for DC when medically stable -all of his follow  up is arranged. -cont 5 days post op abx therapy, may transition to orals -we will sign off.  LOS: 6 days    Jameeka Marcy E 07/14/2015, 9:38 AM Pager: [phone]

## 2015-07-14 NOTE — Discharge Summary (Signed)
Physician Discharge Summary  Chris Simon MRN: [id number] DOB/AGE: [birth year] 81 y.o.  PCP: [name], MD   Admit date: 07/08/2015 Discharge date: 07/14/2015  Discharge Diagnoses:     Principal Problem:   Sepsis [facility]) Active Problems:   Acute kidney injury ([place])   Choledocholithiasis s/p ERCP 07/09/2015   Paroxysmal atrial fibrillation ([place])   UTI (lower urinary tract infection)   Acute cholecystitis with chronic cholecystitis s/p lap chole 07/11/2015   Acute cholecystitis    Follow-up recommendations Follow-up with PCP in 3-5 days , including all  additional recommended appointments as below Follow-up CBC, CMP in 3-5 days Follow-up with [facility], the office to set up the appointment     Medication List    STOP taking these medications        nitrofurantoin (macrocrystal-monohydrate) 100 MG capsule  Commonly known as:  MACROBID     rosuvastatin 10 MG tablet  Commonly known as:  CRESTOR      TAKE these medications        amiodarone 200 MG tablet  Commonly known as:  PACERONE  Take 200 mg by mouth daily.     aspirin EC 325 MG tablet  Take 325 mg by mouth daily.     cephALEXin 500 MG capsule  Commonly known as:  KEFLEX  Take 1 capsule (500 mg total) by mouth 4 (four) times daily.     diltiazem 180 MG 24 hr capsule  Commonly known as:  CARDIZEM CD  Take 1 capsule (180 mg total) by mouth daily.     finasteride 5 MG tablet  Commonly known as:  PROSCAR  Take 5 mg by mouth daily.     multivitamin with minerals Tabs tablet  Take 1 tablet by mouth daily.     ondansetron 4 MG tablet  Commonly known as:  ZOFRAN  Take 1 tablet (4 mg total) by mouth every 6 (six) hours as needed for nausea or vomiting.     oxycodone 5 MG capsule  Commonly known as:  OXY-IR  Take 1 capsule (5 mg total) by mouth every 4 (four) hours as needed.     saccharomyces boulardii 250 MG capsule  Commonly known as:  FLORASTOR  Take 1 capsule (250 mg  total) by mouth 2 (two) times daily.     tamsulosin 0.4 MG Caps capsule  Commonly known as:  FLOMAX  Take 0.4 mg by mouth daily.         Discharge Condition: Stable  Disposition: 02-Short Term Hospital   Consults:  Gastroenterology General surgery     Significant Diagnostic Studies:  Dg Lumbar Spine 2-3 Views  07/08/2015  CLINICAL DATA:  Acute low back pain after fall several days ago. EXAM: LUMBAR SPINE - 2-3 VIEW COMPARISON:  None. FINDINGS: No fracture or spondylolisthesis is noted. Mild degenerative disc disease is noted at L1-2, L2-3 and L5-S1 with mild anterior osteophyte formation at these levels. Atherosclerosis of abdominal aorta is noted. IMPRESSION: Mild multilevel degenerative disc disease is noted. No acute abnormality seen in the lumbar spine. Electronically Signed   By: Marijo Conception, M.D.   On: 07/08/2015 08:13   Dg Chest Port 1 View  07/08/2015  CLINICAL DATA:  Fall 2 days ago with persistent back pain EXAM: PORTABLE CHEST - 1 VIEW COMPARISON:  06/21/2014 FINDINGS: Cardiac shadow is stable. The lungs are well aerated bilaterally. No focal infiltrate, effusion or pneumothorax is seen. No bony abnormality is noted. IMPRESSION: No active disease. Electronically Signed  By: Inez Catalina M.D.   On: 07/08/2015 18:18   Dg Ercp Biliary & Pancreatic Ducts  07/09/2015  CLINICAL DATA:  Cholelithiasis EXAM: ERCP TECHNIQUE: Multiple spot images obtained with the fluoroscopic device and submitted for interpretation post-procedure. FLUOROSCOPY TIME:  5 min 48 seconds COMPARISON:  CT 07/08/2015 FINDINGS: Three intraoperative fluoroscopic spot images document endoscopic cannulation and opacification of the CBD. Multiple filling defects in the mid and distal CBD. Incomplete opacification of the intrahepatic bile ducts, which appear dilated centrally. Final image of the series Demonstrates passage of a balloon catheter through the CBD. IMPRESSION: 1. Choledocholithiasis with  endoscopic intervention These images were submitted for radiologic interpretation only. Please see the procedural report for the amount of contrast and the fluoroscopy time utilized. Electronically Signed   By: Lucrezia Europe M.D.   On: 07/09/2015 13:37   Ct Renal Stone Study  07/08/2015  CLINICAL DATA:  Left flank pain and hematuria EXAM: CT ABDOMEN AND PELVIS WITHOUT CONTRAST TECHNIQUE: Multidetector CT imaging of the abdomen and pelvis was performed following the standard protocol without IV contrast. COMPARISON:  None. FINDINGS: Lung bases are free of acute infiltrate or sizable effusion. The liver is homogeneous in attenuation without focal mass. Central periportal edema is identified as well as some dilatation of the intra and extrahepatic biliary tree. Multiple gallstones are noted within a distended gallbladder. Additionally, there are multiple calculi in the distal aspect of the common bile duct causing the dilatation. The spleen, adrenal glands and pancreas are otherwise within normal limits. The kidneys are well visualized bilaterally with nonobstructing renal calculus on the left measuring 4-5 mm. Additionally a cystic lesion is noted from the upper pole of the right kidney. Diffuse aortoiliac calcifications are noted. Mild dilatation to 3.4 cm is noted. The appendix is within normal limits. There are diverticular changes in the colon although no findings to suggest diverticulitis are seen. A small left inguinal hernia is noted containing a portion of the sigmoid colon although no obstructive changes are seen. The prostate is significantly enlarged measuring 8.8 x 7.6 cm. On the left lateral aspect of the prostate it appears distorted and the possibility of an underlying mass cannot be totally excluded. No definitive pelvic adenopathy is seen. The bladder demonstrates some increased trabeculation likely related to bladder outlet syndrome. IMPRESSION: Enlarged prostate with changes suggestive of bladder  outlet obstruction. The prostate is irregular particularly on its left lateral aspect which may represent an underlying mass lesion. Clinical correlation with the physical exam is recommended. Small left inguinal hernia containing a loop of nonobstructed sigmoid colon. Cholelithiasis as well as evidence of intrahepatic and extrahepatic biliary ductal dilatation. There are at multiple calculi within the distal common bile duct. Nonobstructing left renal stone. Diverticulosis without diverticulitis. Electronically Signed   By: Inez Catalina M.D.   On: 07/08/2015 17:43      Filed Weights   07/12/15 0655 07/13/15 0500 07/14/15 0500  Weight: 86.2 kg (190 lb 0.6 oz) 87.2 kg (192 lb 3.9 oz) 90.7 kg (199 lb 15.3 oz)     Microbiology: Recent Results (from the past 240 hour(s))  Blood Culture (routine x 2)     Status: None   Collection Time: 07/08/15  4:51 PM  Result Value Ref Range Status   Specimen Description BLOOD RIGHT ARM  Final   Special Requests BOTTLES DRAWN AEROBIC AND ANAEROBIC 6CC  Final   Culture  Setup Time   Final    GRAM NEGATIVE RODS IN BOTH AEROBIC AND ANAEROBIC  BOTTLES CRITICAL RESULT CALLED TO, READ BACK BY AND VERIFIED WITH: LEA FERGUSON _0  07/09/15.Marland KitchenMarland KitchenAJO CONFIRMED BY DV    Culture ESCHERICHIA COLI  Final   Report Status 07/11/2015 FINAL  Final   Organism ID, Bacteria ESCHERICHIA COLI  Final      Susceptibility   Escherichia coli - MIC*    AMPICILLIN >=32 RESISTANT Resistant     CEFTAZIDIME <=1 SENSITIVE Sensitive     CEFAZOLIN <=4 SENSITIVE Sensitive     CEFTRIAXONE <=1 SENSITIVE Sensitive     CIPROFLOXACIN >=4 RESISTANT Resistant     GENTAMICIN <=1 SENSITIVE Sensitive     IMIPENEM <=0.25 SENSITIVE Sensitive     TRIMETH/SULFA <=20 SENSITIVE Sensitive     * ESCHERICHIA COLI  Blood Culture (routine x 2)     Status: None   Collection Time: 07/08/15  4:56 PM  Result Value Ref Range Status   Specimen Description BLOOD RIGHT HAND  Final   Special Requests BOTTLES  DRAWN AEROBIC AND ANAEROBIC 6CC  Final   Culture  Setup Time   Final    GRAM NEGATIVE RODS IN BOTH AEROBIC AND ANAEROBIC BOTTLES CRITICAL RESULT CALLED TO, READ BACK BY AND VERIFIED WITH: LEA FERGUSON _1  07/09/15.Marland KitchenMarland KitchenAJO    Culture ESCHERICHIA COLI  Final   Report Status 07/11/2015 FINAL  Final   Organism ID, Bacteria ESCHERICHIA COLI  Final      Susceptibility   Escherichia coli - MIC*    AMPICILLIN >=32 RESISTANT Resistant     CEFTAZIDIME <=1 SENSITIVE Sensitive     CEFAZOLIN <=4 SENSITIVE Sensitive     CEFTRIAXONE <=1 SENSITIVE Sensitive     CIPROFLOXACIN >=4 RESISTANT Resistant     GENTAMICIN <=1 SENSITIVE Sensitive     IMIPENEM <=0.25 SENSITIVE Sensitive     TRIMETH/SULFA <=20 SENSITIVE Sensitive     * ESCHERICHIA COLI  Urine culture     Status: None   Collection Time: 07/08/15  4:57 PM  Result Value Ref Range Status   Specimen Description URINE, RANDOM  Final   Special Requests NONE  Final   Culture >=100,000 COLONIES/mL ESCHERICHIA COLI  Final   Report Status 07/10/2015 FINAL  Final   Organism ID, Bacteria ESCHERICHIA COLI  Final      Susceptibility   Escherichia coli - MIC*    AMPICILLIN >=32 RESISTANT Resistant     CEFTAZIDIME <=1 SENSITIVE Sensitive     CEFAZOLIN <=4 SENSITIVE Sensitive     CEFTRIAXONE <=1 SENSITIVE Sensitive     CIPROFLOXACIN >=4 RESISTANT Resistant     GENTAMICIN <=1 SENSITIVE Sensitive     IMIPENEM <=0.25 SENSITIVE Sensitive     TRIMETH/SULFA <=20 SENSITIVE Sensitive     NITROFURANTOIN Value in next row Sensitive      SENSITIVE<=16    PIP/TAZO Value in next row Sensitive      SENSITIVE<=4    ERTAPENEM Value in next row Sensitive      SENSITIVE<=0.5    LEVOFLOXACIN Value in next row Resistant      RESISTANT>=8    * >=100,000 COLONIES/mL ESCHERICHIA COLI  MRSA PCR Screening     Status: None   Collection Time: 07/08/15  8:26 PM  Result Value Ref Range Status   MRSA by PCR NEGATIVE NEGATIVE Final    Comment:        The GeneXpert MRSA  Assay (FDA approved for NASAL specimens only), is one component of a comprehensive MRSA colonization surveillance program. It is not intended to diagnose MRSA infection nor to guide or monitor  treatment for MRSA infections.   Culture, blood (routine x 2)     Status: None   Collection Time: 07/08/15  9:05 PM  Result Value Ref Range Status   Specimen Description BLOOD LEFT ARM  Final   Special Requests BOTTLES DRAWN AEROBIC AND ANAEROBIC 5CC  Final   Culture NO GROWTH 5 DAYS  Final   Report Status 07/13/2015 FINAL  Final  Culture, blood (routine x 2)     Status: None   Collection Time: 07/08/15  9:48 PM  Result Value Ref Range Status   Specimen Description BLOOD RIGHT ARM  Final   Special Requests BOTTLES DRAWN AEROBIC AND ANAEROBIC 5CC  Final   Culture NO GROWTH 5 DAYS  Final   Report Status 07/13/2015 FINAL  Final  Urine culture     Status: None   Collection Time: 07/08/15 10:56 PM  Result Value Ref Range Status   Specimen Description URINE, CLEAN CATCH  Final   Special Requests NONE  Final   Culture NO GROWTH 2 DAYS  Final   Report Status 07/10/2015 FINAL  Final  Surgical pcr screen     Status: None   Collection Time: 07/11/15  6:25 AM  Result Value Ref Range Status   MRSA, PCR NEGATIVE NEGATIVE Final   Staphylococcus aureus NEGATIVE NEGATIVE Final    Comment:        The Xpert SA Assay (FDA approved for NASAL specimens in patients over 49 years of age), is one component of a comprehensive surveillance program.  Test performance has been validated by [facility] for patients greater than or equal to 77 year old. It is not intended to diagnose infection nor to guide or monitor treatment.   MRSA PCR Screening     Status: None   Collection Time: 07/11/15  3:35 PM  Result Value Ref Range Status   MRSA by PCR NEGATIVE NEGATIVE Final    Comment:        The GeneXpert MRSA Assay (FDA approved for NASAL specimens only), is one component of a comprehensive MRSA  colonization surveillance program. It is not intended to diagnose MRSA infection nor to guide or monitor treatment for MRSA infections.        Blood Culture    Component Value Date/Time   SDES URINE, CLEAN CATCH 07/08/2015 2256   SPECREQUEST NONE 07/08/2015 2256   CULT NO GROWTH 2 DAYS 07/08/2015 2256   REPTSTATUS 07/10/2015 FINAL 07/08/2015 2256      Labs: Results for orders placed or performed during the hospital encounter of 07/08/15 (from the past 48 hour(s))  CBC     Status: Abnormal   Collection Time: 07/13/15  2:49 AM  Result Value Ref Range   WBC 9.0 4.0 - 10.5 K/uL   RBC 4.15 (L) 4.22 - 5.81 MIL/uL   Hemoglobin 12.9 (L) 13.0 - 17.0 g/dL   HCT 39.0 39.0 - 52.0 %   MCV 94.0 78.0 - 100.0 fL   MCH 31.1 26.0 - 34.0 pg   MCHC 33.1 30.0 - 36.0 g/dL   RDW 13.3 11.5 - 15.5 %   Platelets 160 150 - 400 K/uL  Comprehensive metabolic panel     Status: Abnormal   Collection Time: 07/13/15  2:49 AM  Result Value Ref Range   Sodium 141 135 - 145 mmol/L   Potassium 3.2 (L) 3.5 - 5.1 mmol/L   Chloride 105 101 - 111 mmol/L   CO2 27 22 - 32 mmol/L   Glucose, Bld 107 (H)  65 - 99 mg/dL   BUN 6 6 - 20 mg/dL   Creatinine, Ser 0.89 0.61 - 1.24 mg/dL   Calcium 8.0 (L) 8.9 - 10.3 mg/dL   Total Protein 5.1 (L) 6.5 - 8.1 g/dL   Albumin 2.2 (L) 3.5 - 5.0 g/dL   AST 26 15 - 41 U/L   ALT 34 17 - 63 U/L   Alkaline Phosphatase 99 38 - 126 U/L   Total Bilirubin 1.2 0.3 - 1.2 mg/dL   GFR calc non Af Amer >60 >60 mL/min   GFR calc Af Amer >60 >60 mL/min    Comment: (NOTE) The eGFR has been calculated using the CKD EPI equation. This calculation has not been validated in all clinical situations. eGFR's persistently <60 mL/min signify possible Chronic Kidney Disease.    Anion gap 9 5 - 15  Lipase, blood     Status: Abnormal   Collection Time: 07/13/15  2:49 AM  Result Value Ref Range   Lipase 18 (L) 22 - 51 U/L  Magnesium     Status: None   Collection Time: 07/13/15  2:30 PM   Result Value Ref Range   Magnesium 1.8 1.7 - 2.4 mg/dL     Lipid Panel  No results found for: CHOL, TRIG, HDL, CHOLHDL, VLDL, LDLCALC, LDLDIRECT   Lab Results  Component Value Date   HGBA1C 5.6 07/11/2015   HGBA1C 5.8 06/22/2014     Lab Results  Component Value Date   CREATININE 0.89 07/13/2015     HPI HPI: This gentleman presents transferred from [facility]. He presented there with a one-day history of back pain, chills, rigor, and confusion. He was also found to be hypotensive. He has a known history of gallstones. He has had a CT scan with renal protocol showing a dilated gallbladder with periportal edema, dilated intra-and extra hepatic bile ducts, gallstones, and distal common bile duct stones. He was also found to have elevated liver function tests. Currently, he is still slightly confused. His family is at bedside. He reports minimal abdominal discomfort at this point. He denied any vomiting. Bowel movements have been normal.   HOSPITAL COURSE:   Sepsis secondary to UTI/acute cholangitis/gram-negative bacteremia secondary to Escherichia coli. Consulted GI for ascending cholangitis, status post ERCP with stents territory/papillotomy and removal of calculus. On 10/12. Subsequently the patient underwent laparoscopic cholecystectomy and primary and like a hernia repair by Dr. Donne Hazel on 10/14. Initially started on Zosyn, changed to Rocephin, subsequently switched to Keflex by mouth for 2 more weeks at discharge laparoscopic cholecystectomy , primary umbilical hernia repair on 10/14 Continue to advance type of surgery   Acute metabolic encephalopathy due to sepsis secondary to gram-negative bacteremia, minimize sedation continue to treat underlying infection  Severe sepsis due to UTI/cholangitis, improving with IV antibiotics  Acute kidney injury improving with IV fluids, wean IV fluids, DC fluids when the patient is able to eat and advance his diet. Creatinine 0.89 down from  1.32  Hematuria likely secondary to Foley trauma, CBC stable, hematuria resolved, DC Foley and discharge, patient may need urology follow-up  Atrial fibrillation with rapid ventricular postoperatively, patient has a known history of atrial fibrillation, patient was continued on amiodarone, IV Cardizem when necessary subsequently switched to by mouth Cardizem. Patient switched to normal sinus rhythm prior to discharge. On no anticoagulation at home, on aspirin , surgery recommended to restart aspirin . Patient is followed by cardiologist in [place].   Nonobstructing renal stone/enlarged prostrate with bladder outlet obstruction-continue Foley catheter  will need outpatient urology examination, continue Flomax/finasteride    Hyperglycemia-continue SSI, hemoglobin A1c 5.6  Dyslipidemia-hold Crestor   Discharge Exam:    Blood pressure 134/70, pulse 65, temperature 98.1 F (36.7 C), temperature source Oral, resp. rate 18, height 6' (1.829 m), weight 90.7 kg (199 lb 15.3 oz), SpO2 95 %.  General: Somnolent Neuro: Non-focal HEENT: [place]/AT, PERRL, no appreciable JVD Cardiovascular: RRR, no MRG Lungs: Clear bilateral breath sounds Abdomen: Soft, non-tender, non-distended Musculoskeletal: No acute deformity or ROM limitation Skin: Grossly intact       Discharge Instructions    Diet - low sodium heart healthy    Complete by:  As directed      Diet - low sodium heart healthy    Complete by:  As directed      Increase activity slowly    Complete by:  As directed      Increase activity slowly    Complete by:  As directed      Increase activity slowly    Complete by:  As directed            Follow-up Information    Follow up with LIEPINS, ANDY, PA-C On 07/30/2015.   Specialty:  Surgery   Why:  [facility], 10:00am, arrive no later than 9:30am for paperwork   Contact information:   [place]  [phone]       Follow up  with [facility], DALE E, MD. Schedule an appointment as soon as possible for a visit in 1 week.   Specialty:  Family Medicine   Contact information:   [place] [address] [phone]       Signed: Reyne Dumas 07/14/2015, 2:14 PM        Time spent >45 mins

## 2015-07-14 NOTE — Care Management Note (Signed)
Case Management Note  Patient Details  Name: Chris Simon MRN: [id number] Date of Birth: 14-Jan-1934  Subjective/Objective:       Date: 07/14/15 Spoke with patient at the bedside along with daughter, Veleta Miners [phone]. Introduced self as Tourist information centre manager and explained role in discharge planning and how to be reached. Verified patient lives in [place] with daughter, per daughter they will borrow rolling walker from family member. Expressed no potential need for no other DME. Verified patient anticipates to go home with daughter at time of discharge and will have  part-time supervision by family  at this time to best of their knowledge. Patient denied needing help with their medication. Patient  is driven by daughter to MD appointments. Verified patient has PCP [facility]. Daughter chose [facility] for hhpt,  They states they do not need HHRN or HHOT.  Referral made to [facility] , information faxed over.  They are checking to see if they are in network with patient's insurance.   Plan: CM will continue to follow for discharge planning and [facility] resources.              Action/Plan:   Expected Discharge Date:                  Expected Discharge Plan:  [place]  In-House Referral:     Discharge planning Services  CM Consult  Post Acute Care Choice:  Home Health Choice offered to:  Adult Children  DME Arranged:    DME Agency:     HH Arranged:  PT HH Agency:  [facility]  Status of Service:  Completed, signed off  Medicare Important Message Given:  Yes-second notification given Date Medicare IM Given:    Medicare IM give by:    Date Additional Medicare IM Given:    Additional Medicare Important Message give by:     If discussed at [place] of Stay Meetings, dates discussed:    Additional Comments:  Zenon Mayo, RN 07/14/2015, 2:11 PM

## 2015-07-14 NOTE — Evaluation (Signed)
Physical Therapy Evaluation Patient Details Name: Chris Simon MRN: [id number] DOB: 1934/07/22 Today's Date: 07/14/2015   History of Present Illness  81 yo male with onset of sepsis from e coli bacteremia, cholecystitis and lap chole on 10/14.     Clinical Impression  Pt was able to walk with minor help mainly for safety but cannot be safely allowed to walk alone.  Noted issues of pt releasing walker to stand near bed and daughter did not understand PT issue of concern.  May be home with help which daughter states she can provide.  Will continue PT as pt is able to work with therapy.    Follow Up Recommendations Home health PT;Supervision/Assistance - 24 hour    Equipment Recommendations  Rolling walker with 5" wheels    Recommendations for Other Services       Precautions / Restrictions Precautions Precautions: Fall (telemetry) Restrictions Weight Bearing Restrictions: No      Mobility  Bed Mobility Overal bed mobility: Modified Independent (used elevated HOB)                Transfers Overall transfer level: Modified independent Equipment used: Rolling walker (2 wheeled);1 person hand held assist             General transfer comment: sits with no hands with control at bedside  Ambulation/Gait Ambulation/Gait assistance: Min guard (for safety) Ambulation Distance (Feet): 200 Feet Assistive device: Rolling walker (2 wheeled);1 person hand held assist (for safety) Gait Pattern/deviations: Step-through pattern;Wide base of support;Trunk flexed;Shuffle Gait velocity: reduced Gait velocity interpretation: Below normal speed for age/gender General Gait Details: pt is not concerned about safety but needs supervisory help  Stairs            Wheelchair Mobility    Modified Rankin (Stroke Patients Only)       Balance Overall balance assessment: Needs assistance Sitting-balance support: Feet supported Sitting balance-Leahy Scale: Good   Postural  control: Posterior lean Standing balance support: Bilateral upper extremity supported Standing balance-Leahy Scale: Fair Standing balance comment: unsafe without AD, not apparent to daughter in room who will be going home with him                             Pertinent Vitals/Pain Pain Assessment: No/denies pain    Home Living Family/patient expects to be discharged to:: Private residence Living Arrangements: Children (only in the evening) Available Help at Discharge: Family;Available PRN/intermittently Type of Home: House Home Access: Ramped entrance     Home Layout: One level Home Equipment: Bedside commode (from another family member) Additional Comments: Home alone in daytime but drove and was I with all activites    Prior Function Level of Independence: Independent               Hand Dominance        Extremity/Trunk Assessment   Upper Extremity Assessment: Overall WFL for tasks assessed           Lower Extremity Assessment: Generalized weakness (hips and ankles 4+)      Cervical / Trunk Assessment: Normal  Communication   Communication: No difficulties  Cognition Arousal/Alertness: Awake/alert Behavior During Therapy: WFL for tasks assessed/performed Overall Cognitive Status: Within Functional Limits for tasks assessed                      General Comments General comments (skin integrity, edema, etc.): Pt has been able to walk with  staff but does demonstrate some limitations of  balance and endurance that require RW and supervisory help.    Exercises        Assessment/Plan    PT Assessment Patient needs continued PT services  PT Diagnosis Abnormality of gait   PT Problem List Decreased strength;Decreased range of motion;Decreased activity tolerance;Decreased balance;Decreased mobility;Decreased knowledge of use of DME;Decreased safety awareness;Cardiopulmonary status limiting activity;Decreased skin integrity  PT Treatment  Interventions DME instruction;Gait training;Functional mobility training;Therapeutic activities;Therapeutic exercise;Neuromuscular re-education;Balance training;Patient/family education   PT Goals (Current goals can be found in the Care Plan section) Acute Rehab PT Goals Patient Stated Goal: to go home PT Goal Formulation: With patient/family Time For Goal Achievement: 07/28/15 Potential to Achieve Goals: Good    Frequency Min 2X/week   Barriers to discharge Inaccessible home environment;Decreased caregiver support daughter states she can come stay with him    Co-evaluation               End of Session   Activity Tolerance: Patient tolerated treatment well;Patient limited by fatigue Patient left: in bed;with call bell/phone within reach;with bed alarm set;with family/visitor present Nurse Communication: Mobility status         Time: 1478-2956 PT Time Calculation (min) (ACUTE ONLY): 35 min   Charges:   PT Evaluation $Initial PT Evaluation Tier I: 1 Procedure PT Treatments $Gait Training: 8-22 mins   PT G CodesRamond Dial 2015/08/08, 11:20 AM   Mee Hives, PT MS Acute Rehab Dept. Number: [facility] [id number] and [place] [phone]

## 2015-07-18 ENCOUNTER — Ambulatory Visit (INDEPENDENT_AMBULATORY_CARE_PROVIDER_SITE_OTHER): Payer: Medicare HMO | Admitting: Urology

## 2015-07-18 ENCOUNTER — Encounter: Payer: Self-pay | Admitting: Urology

## 2015-07-18 VITALS — BP 136/73 | HR 64 | Ht 72.0 in | Wt 185.9 lb

## 2015-07-18 DIAGNOSIS — R972 Elevated prostate specific antigen [PSA]: Secondary | ICD-10-CM | POA: Diagnosis not present

## 2015-07-18 DIAGNOSIS — N400 Enlarged prostate without lower urinary tract symptoms: Secondary | ICD-10-CM | POA: Insufficient documentation

## 2015-07-18 DIAGNOSIS — A419 Sepsis, unspecified organism: Secondary | ICD-10-CM | POA: Diagnosis not present

## 2015-07-18 DIAGNOSIS — N390 Urinary tract infection, site not specified: Secondary | ICD-10-CM | POA: Diagnosis not present

## 2015-07-18 DIAGNOSIS — N401 Enlarged prostate with lower urinary tract symptoms: Secondary | ICD-10-CM | POA: Diagnosis not present

## 2015-07-18 DIAGNOSIS — N138 Other obstructive and reflux uropathy: Secondary | ICD-10-CM

## 2015-07-18 LAB — MICROSCOPIC EXAMINATION
Bacteria, UA: NONE SEEN
Epithelial Cells (non renal): NONE SEEN /hpf (ref 0–10)
RBC MICROSCOPIC, UA: NONE SEEN /HPF (ref 0–?)
RENAL EPITHEL UA: NONE SEEN /HPF
WBC UA: NONE SEEN /HPF (ref 0–?)

## 2015-07-18 LAB — BLADDER SCAN AMB NON-IMAGING: SCAN RESULT: 130

## 2015-07-18 LAB — URINALYSIS, COMPLETE
Bilirubin, UA: NEGATIVE
GLUCOSE, UA: NEGATIVE
Ketones, UA: NEGATIVE
Leukocytes, UA: NEGATIVE
NITRITE UA: NEGATIVE
PH UA: 6.5 (ref 5.0–7.5)
Protein, UA: NEGATIVE
RBC, UA: NEGATIVE
Specific Gravity, UA: 1.02 (ref 1.005–1.030)
UUROB: 0.2 mg/dL (ref 0.2–1.0)

## 2015-07-18 NOTE — Progress Notes (Signed)
07/18/2015 10:38 AM   [name] 1933/12/28 [id number]  Referring provider: [name], MD [place], [place] [address]  Chief Complaint  Patient presents with  . Benign Prostatic Hypertrophy    6 month recheck    HPI: Mr. Habibi is a 81 year old white male with history of elevated PSA,  urinary tract infection with sepsis and BPH with LUTS who presents today for 6 month recheck.  Patient was hospitalized earlier this month for sepsis secondary to a UTI/acute cholangitis/gram-negative bacteremia secondary to E. Coli and subsequently had his gallbladder removed and underwent ERCP.  He is currently on Keflex.  Elevated PSA Patient's PSA reached 23.8 in June of last year. He underwent a prostate biopsy at that time which returned negative. His most recent PSA is 17.2 on 01/02/2015. We are obtaining a PSA today. He is on finasteride.  CT completed during his hospital admission earlier this month noted a irregularity of the prostate on the left lateral aspect.    BPH WITH LUTS His IPSS score today is 20, which is severe lower urinary tract symptomatology. He is mixed with his quality life due to his urinary symptoms. His PVR is 130 mL.  he does not have any complaints at this time, but his daughter states that he is having urinary frequency and nocturia.  He denies any dysuria or suprapubic pain.   He did have an episode of gross hematuria with his urinary tract infection. He is not currently having gross hematuria. His UA today is negative.  He is on maximal medical therapy with tamsulosin and finasteride.  He also denies any recent fevers, chills, nausea or vomiting.  He does not have a family history of PCa.      IPSS      07/18/15 1000       International Prostate Symptom Score   How often have you had the sensation of not emptying your bladder? More than half the time     How often have you had to urinate less than  every two hours? More than half the time     How often have you found you stopped and started again several times when you urinated? More than half the time     How often have you found it difficult to postpone urination? Almost always     How often have you had a weak urinary stream? About half the time     How often have you had to strain to start urination? Not at All     How many times did you typically get up at night to urinate? None     Total IPSS Score 20     Quality of Life due to urinary symptoms   If you were to spend the rest of your life with your urinary condition just the way it is now how would you feel about that? Mixed        Score:  1-7 Mild 8-19 Moderate 20-35 Severe  UTI with sepsis Patient was hospitalized for sepsis secondary to a UTI with Escherichia coli. The organism is sensitive to the Keflex he is currently on. He is not having symptoms at this time. His UA is completely negative.  PMH: Past Medical History  Diagnosis Date  . Atrial fibrillation ([place])   . Kidney stones   . Gallstone   . BPH with obstruction/lower urinary tract symptoms   . Elevated PSA   .  Testicular pain   . Urinary retention   . Aneurysm ([place])   . Gross hematuria   . Epididymitis   . Nocturia   . Cellulitis of scrotum   . Blockage of a bile duct     Surgical History: Past Surgical History  Procedure Laterality Date  . Ercp N/A 07/09/2015    Procedure: ENDOSCOPIC RETROGRADE CHOLANGIOPANCREATOGRAPHY (ERCP);  Surgeon: Clarene Essex, MD;  Location: [facility]COPY;  Service: Endoscopy;  Laterality: N/A;  . Cholecystectomy N/A 07/11/2015    Procedure: LAPAROSCOPIC CHOLECYSTECTOMY;  Surgeon: Rolm Bookbinder, MD;  Location: [place];  Service: General;  Laterality: N/A;    Home Medications:    Medication List       This list is accurate as of: 07/18/15 10:38 AM.  Always use your most recent med list.               amiodarone 200 MG tablet  Commonly known as:  PACERONE  Take  200 mg by mouth daily.     aspirin EC 325 MG tablet  Take 325 mg by mouth daily.     cephALEXin 500 MG capsule  Commonly known as:  KEFLEX  Take 1 capsule (500 mg total) by mouth 4 (four) times daily.     diltiazem 180 MG 24 hr capsule  Commonly known as:  CARDIZEM CD  Take 1 capsule (180 mg total) by mouth daily.     finasteride 5 MG tablet  Commonly known as:  PROSCAR  Take 5 mg by mouth daily.     multivitamin with minerals Tabs tablet  Take 1 tablet by mouth daily.     nitrofurantoin (macrocrystal-monohydrate) 100 MG capsule  Commonly known as:  MACROBID     ondansetron 4 MG tablet  Commonly known as:  ZOFRAN  Take 1 tablet (4 mg total) by mouth every 6 (six) hours as needed for nausea or vomiting.     oxycodone 5 MG capsule  Commonly known as:  OXY-IR  Take 1 capsule (5 mg total) by mouth every 4 (four) hours as needed.     saccharomyces boulardii 250 MG capsule  Commonly known as:  FLORASTOR  Take 1 capsule (250 mg total) by mouth 2 (two) times daily.     tamsulosin 0.4 MG Caps capsule  Commonly known as:  FLOMAX  Take 0.4 mg by mouth daily.        Allergies:  Allergies  Allergen Reactions  . Clindamycin/Lincomycin Other (See Comments)    Reaction:  Unknown   . Sulfamethoxazole-Trimethoprim Rash    Family History: Family History  Problem Relation Age of Onset  . Kidney disease Neg Hx   . Prostate cancer Neg Hx   . Bladder Cancer Neg Hx     Social History:  reports that he has quit smoking. He does not have any smokeless tobacco history on file. He reports that he does not drink alcohol or use illicit drugs.  ROS: UROLOGY Frequent Urination?: Yes Hard to postpone urination?: Yes Burning/pain with urination?: No Get up at night to urinate?: Yes Leakage of urine?: No Urine stream starts and stops?: No Trouble starting stream?: No Do you have to strain to urinate?: No Blood in urine?: No Urinary tract infection?: Yes Sexually transmitted  disease?: No Injury to kidneys or bladder?: No Painful intercourse?: No Weak stream?: No Erection problems?: No Penile pain?: No  Gastrointestinal Nausea?: No Vomiting?: No Indigestion/heartburn?: No Diarrhea?: No Constipation?: No  Constitutional Fever: No Night sweats?: No Weight loss?: Yes  Fatigue?: Yes  Skin Skin rash/lesions?: No Itching?: No  Eyes Blurred vision?: No Double vision?: No  Ears/Nose/Throat Sore throat?: No Sinus problems?: No  Hematologic/Lymphatic Swollen glands?: No Easy bruising?: No  Cardiovascular Leg swelling?: No Chest pain?: No  Respiratory Cough?: Yes Shortness of breath?: Yes  Endocrine Excessive thirst?: No  Musculoskeletal Back pain?: No Joint pain?: No  Neurological Headaches?: No Dizziness?: No  Psychologic Depression?: No Anxiety?: No  Physical Exam: BP 136/73 mmHg  Pulse 64  Ht 6' (1.829 m)  Wt 185 lb 14.4 oz (84.324 kg)  BMI 25.21 kg/m2  GU: Patient with circumcised phallus.  Urethral meatus is patent.  No penile discharge. No penile lesions or rashes. Scrotum without lesions, cysts, rashes and/or edema.  Testicles are located scrotally bilaterally. No masses are appreciated in the testicles. Left and right epididymis are normal. Rectal: Patient with  normal sphincter tone. Perineum without scarring or rashes. No rectal masses are appreciated. Prostate is approximately 80 grams, no nodules are appreciated, but could not palpate the entire gland due to the size of the gland.  Seminal vesicles are normal.   Laboratory Data: Lab Results  Component Value Date   WBC 9.0 07/13/2015   HGB 12.9* 07/13/2015   HCT 39.0 07/13/2015   MCV 94.0 07/13/2015   PLT 160 07/13/2015    Lab Results  Component Value Date   CREATININE 0.89 07/13/2015    PSA History:  10.4 ng/mL on 09/17/2013  23.8 ng/mL on 03/18/2014  BX negative 04/2014  8.1 ng/mL on 10/03/2014  Lab Results  Component Value Date   HGBA1C 5.6  07/11/2015    Urinalysis: Results for orders placed or performed in visit on 07/18/15  Microscopic Examination  Result Value Ref Range   WBC, UA None seen 0 -  5 /hpf   RBC, UA None seen 0 -  2 /hpf   Epithelial Cells (non renal) None seen 0 - 10 /hpf   Renal Epithel, UA None seen None seen /hpf   Bacteria, UA None seen None seen/Few  PSA  Result Value Ref Range   Prostate Specific Ag, Serum 18.4 (H) 0.0 - 4.0 ng/mL  Urinalysis, Complete  Result Value Ref Range   Specific Gravity, UA 1.020 1.005 - 1.030   pH, UA 6.5 5.0 - 7.5   Color, UA Yellow Yellow   Appearance Ur Clear Clear   Leukocytes, UA Negative Negative   Protein, UA Negative Negative/Trace   Glucose, UA Negative Negative   Ketones, UA Negative Negative   RBC, UA Negative Negative   Bilirubin, UA Negative Negative   Urobilinogen, Ur 0.2 0.2 - 1.0 mg/dL   Nitrite, UA Negative Negative   Microscopic Examination See below:   BLADDER SCAN AMB NON-IMAGING  Result Value Ref Range   Scan Result 130     Pertinent Imaging: Results for STEAVEN, WHOLEY (MRN [id number]) as of 07/18/2015 11:08  Ref. Range 07/18/2015 10:54  Scan Result Unknown 130    Assessment & Plan:    1. BPH (benign prostatic hyperplasia) with LUTS:   I PSS score is 20/3. His PVR is 130 mL. His DRE is noted massive BPH. He is still having obstructive symptoms in spite of maximal medicinal therapy with tamsulosin and finasteride.  We will have him undergo cystoscopic examination to see if he is a candidate for an outlet procedure.  I have explained to the patient that they will  be scheduled for a cystoscopy in our office to evaluate their bladder.  The cystoscopy consists of passing a tube with a lens up through their urethra and into their urinary bladder.   We will inject the urethra with a lidocaine gel prior to introducing the cystoscope to help with any discomfort during the procedure.   After the procedure, they might experience blood in the urine  and discomfort with urination.  This will abate after the first few voids.  I have  encouraged the patient to increase water intake  during this time.  Patient denies any allergies to lidocaine.   - PSA  2. Elevated PSA:  Patient's PSA reached 23.8 in June of last year. He underwent a prostate biopsy at that time which returned negative. His most recent PSA is 17.2 on 01/02/2015. We are obtaining a PSA today. He is on finasteride.  CT completed during his hospital admission earlier this month noted a irregularity of the prostate on the left lateral aspect.  If his PSA is continuing to rise, we may consider repeat biopsy. Patient does suffer with memory issues and has multiple co-morbidities.  We will need to discuss this with his daughter, [name], if the PSA has significantly increased.  3. UTI with sepsis:   Patient was hospitalized earlier this month for sepsis secondary to a UTI/acute cholangitis/gram-negative bacteremia secondary to E. Coli and subsequently had his gallbladder removed and underwent ERCP.  He is currently on Keflex.  His UA is unremarkable today.    Zara Council, [facility] [address], [place], [place] [address] [phone]

## 2015-07-19 LAB — PSA: PROSTATE SPECIFIC AG, SERUM: 18.4 ng/mL — AB (ref 0.0–4.0)

## 2015-07-20 DIAGNOSIS — A419 Sepsis, unspecified organism: Secondary | ICD-10-CM | POA: Insufficient documentation

## 2015-07-20 DIAGNOSIS — N390 Urinary tract infection, site not specified: Secondary | ICD-10-CM | POA: Insufficient documentation

## 2015-07-20 HISTORY — DX: Sepsis, unspecified organism: A41.9

## 2015-07-20 HISTORY — DX: Sepsis, unspecified organism: N39.0

## 2015-07-22 ENCOUNTER — Telehealth: Payer: Self-pay

## 2015-07-22 DIAGNOSIS — R972 Elevated prostate specific antigen [PSA]: Secondary | ICD-10-CM

## 2015-07-22 NOTE — Telephone Encounter (Signed)
Spoke with pt in reference to PSA results. Pt voiced understanding. Pt requested nurse mail him appt card.

## 2015-07-22 NOTE — Telephone Encounter (Signed)
-----   Message from [name], PA-C sent at 07/22/2015 10:20 AM EDT ----- PSA is elevated, but patient has had a recent infection.  I would like it repeated in one month.

## 2015-07-23 ENCOUNTER — Telehealth: Payer: Self-pay

## 2015-07-23 NOTE — Telephone Encounter (Signed)
Pt daughter, [name], called worried about what information was given to the pt yesterday. Nurse reinforced with daughter that due to pt recent infection [place] thinks this is having an influence on pt most recent PSA. Daughter wanted pt to come in earlier for PSA lab draw than the 4 weeks. Nurse reinforced with daughter pt can not come in any sooner due to the infection needing to be resolved and the body needs to get back to its normal state so we do not get a possible false positive on elevated PSA. Daughter voiced understanding.

## 2015-08-07 ENCOUNTER — Ambulatory Visit (INDEPENDENT_AMBULATORY_CARE_PROVIDER_SITE_OTHER): Payer: Medicare HMO | Admitting: Urology

## 2015-08-07 DIAGNOSIS — N401 Enlarged prostate with lower urinary tract symptoms: Principal | ICD-10-CM

## 2015-08-07 DIAGNOSIS — N138 Other obstructive and reflux uropathy: Secondary | ICD-10-CM

## 2015-08-07 MED ORDER — CIPROFLOXACIN HCL 500 MG PO TABS: 500.0000 mg | ORAL_TABLET | Freq: Once | ORAL | Status: DC

## 2015-08-07 MED ORDER — LIDOCAINE HCL 2 % EX GEL: 1.0000 "application " | Freq: Once | CUTANEOUS | Status: DC

## 2015-08-07 NOTE — Progress Notes (Signed)
Patient presented today for scheduled cystoscopy. UA highly suspicious for infection, nitrate positive, leukocyte esterase positive. He is currently asymptomatic. Plan to send urine culture and treat appropriately prior to proceeding with cystoscopy.   Hollice Espy, MD

## 2015-08-08 LAB — MICROSCOPIC EXAMINATION: RBC, UA: NONE SEEN /hpf (ref 0–?)

## 2015-08-08 LAB — URINALYSIS, COMPLETE
BILIRUBIN UA: NEGATIVE
Glucose, UA: NEGATIVE
Nitrite, UA: POSITIVE — AB
PH UA: 5.5 (ref 5.0–7.5)
PROTEIN UA: NEGATIVE
Specific Gravity, UA: 1.025 (ref 1.005–1.030)
Urobilinogen, Ur: 0.2 mg/dL (ref 0.2–1.0)

## 2015-08-10 LAB — CULTURE, URINE COMPREHENSIVE

## 2015-08-10 MED ORDER — AMOXICILLIN-POT CLAVULANATE 875-125 MG PO TABS: 1.0000 | ORAL_TABLET | Freq: Two times a day (BID) | ORAL | Status: DC

## 2015-08-10 NOTE — Addendum Note (Signed)
Addended by: Hollice Espy on: 08/10/2015 11:43 AM   Modules accepted: Orders

## 2015-08-12 ENCOUNTER — Telehealth: Payer: Self-pay

## 2015-08-12 ENCOUNTER — Telehealth: Payer: Self-pay | Admitting: Urology

## 2015-08-12 NOTE — Telephone Encounter (Signed)
Pt's daughter called to find out lab results from last week.  Please call her today.

## 2015-08-12 NOTE — Telephone Encounter (Signed)
-----   Message from Hollice Espy, MD sent at 08/10/2015 11:43 AM EST ----- Please call and let this patient know we are going to treat him with Augmentin  for UTI prior to next cysto. Order placed and sent to pharmacy.    Hollice Espy, MD

## 2015-08-12 NOTE — Telephone Encounter (Signed)
LMOM

## 2015-08-12 NOTE — Telephone Encounter (Signed)
Patient called and i gave him the message   Chris Simon

## 2015-08-13 ENCOUNTER — Telehealth: Payer: Self-pay

## 2015-08-13 NOTE — Telephone Encounter (Signed)
-----   Message from Hollice Espy, MD sent at 08/10/2015 11:43 AM EST ----- Please call and let this patient know we are going to treat him with Augmentin  for UTI prior to next cysto. Order placed and sent to pharmacy.    Hollice Espy, MD

## 2015-08-13 NOTE — Telephone Encounter (Signed)
LMOM for daughter.

## 2015-08-14 NOTE — Telephone Encounter (Signed)
LMOM

## 2015-08-14 NOTE — Telephone Encounter (Signed)
Pt's daughter, Veleta Miners, called & they have switched pharmacies.  Now using [facility] in [place] and needs for all medicines to be called in to there.  Please call [phone].

## 2015-08-14 NOTE — Telephone Encounter (Signed)
Spoke with pt daughter, [name], in reference to abx for pt. [name] voiced understanding.

## 2015-08-25 ENCOUNTER — Other Ambulatory Visit: Payer: Medicare HMO | Admitting: Urology

## 2015-08-25 ENCOUNTER — Other Ambulatory Visit: Payer: Self-pay

## 2015-08-26 ENCOUNTER — Other Ambulatory Visit: Payer: Medicare HMO | Admitting: Urology

## 2015-09-03 ENCOUNTER — Encounter: Payer: Self-pay | Admitting: Urology

## 2015-09-03 ENCOUNTER — Ambulatory Visit: Payer: Medicare HMO | Admitting: Urology

## 2015-09-03 VITALS — BP 146/77 | HR 74 | Ht 72.0 in | Wt 185.9 lb

## 2015-09-03 DIAGNOSIS — N138 Other obstructive and reflux uropathy: Secondary | ICD-10-CM

## 2015-09-03 DIAGNOSIS — N390 Urinary tract infection, site not specified: Secondary | ICD-10-CM

## 2015-09-03 DIAGNOSIS — N401 Enlarged prostate with lower urinary tract symptoms: Principal | ICD-10-CM

## 2015-09-03 DIAGNOSIS — R972 Elevated prostate specific antigen [PSA]: Secondary | ICD-10-CM

## 2015-09-03 DIAGNOSIS — N200 Calculus of kidney: Secondary | ICD-10-CM | POA: Insufficient documentation

## 2015-09-03 DIAGNOSIS — N442 Benign cyst of testis: Secondary | ICD-10-CM | POA: Insufficient documentation

## 2015-09-03 LAB — URINALYSIS, COMPLETE
Bilirubin, UA: NEGATIVE
Glucose, UA: NEGATIVE
Ketones, UA: NEGATIVE
NITRITE UA: POSITIVE — AB
Protein, UA: NEGATIVE
SPEC GRAV UA: 1.02 (ref 1.005–1.030)
Urobilinogen, Ur: 0.2 mg/dL (ref 0.2–1.0)
pH, UA: 5.5 (ref 5.0–7.5)

## 2015-09-03 LAB — MICROSCOPIC EXAMINATION
EPITHELIAL CELLS (NON RENAL): NONE SEEN /HPF (ref 0–10)
RBC MICROSCOPIC, UA: NONE SEEN /HPF (ref 0–?)

## 2015-09-03 MED ORDER — CEFDINIR 300 MG PO CAPS: 300.0000 mg | ORAL_CAPSULE | Freq: Two times a day (BID) | ORAL | Status: DC

## 2015-09-03 MED ORDER — LIDOCAINE HCL 2 % EX GEL: 1.0000 "application " | Freq: Once | CUTANEOUS | Status: DC

## 2015-09-03 MED ORDER — NITROFURANTOIN MACROCRYSTAL 100 MG PO CAPS
100.0000 mg | ORAL_CAPSULE | Freq: Every day | ORAL | Status: DC
Start: 2015-09-03 — End: 2016-03-25

## 2015-09-03 MED ORDER — NITROFURANTOIN MACROCRYSTAL 100 MG PO CAPS
100.0000 mg | ORAL_CAPSULE | Freq: Once | ORAL | Status: DC
Start: 2015-09-03 — End: 2015-09-03

## 2015-09-03 MED ORDER — CIPROFLOXACIN HCL 500 MG PO TABS: 500.0000 mg | ORAL_TABLET | Freq: Once | ORAL | Status: DC

## 2015-09-03 NOTE — Progress Notes (Signed)
09/03/2015 3:33 PM   [name] [birth year] [id number]  Referring provider: [name], MD [place], [place] [address]  Chief Complaint  Patient presents with  . Cysto    HPI: [place]: Mr. Salo is a 81 year old white male with history of elevated PSA, urinary tract infection with sepsis and BPH with LUTS who presents today for 6 month recheck.  Patient was hospitalized earlier this month for sepsis secondary to a UTI/acute cholangitis/gram-negative bacteremia secondary to E. Coli and subsequently had his gallbladder removed and underwent ERCP. He is currently on Keflex.   The patient is on Flomax and finasteride. He's had a residual 130 mL. His frequency and nocturia. He has an elevated PSA as previously noted with a long note. I believe he is here for cystoscopy. He had a recent CT scan without contrast with normal upper tracts. It in 9 cm x 7.6 cm prostate in the left lateral aspect of the prostate was distorted and there was question whether or not there could be a mass effect  The patient had his cystoscopy deferred by Dr. Erlene Quan recently because of suspected urinary tract infection. His urine culture was positive that day  Once again the patient came in today with a very cloudy urine. Urinalysis with positive. He wasn't having increased frequency or burning   PMH: Past Medical History  Diagnosis Date  . Atrial fibrillation ([place])   . Kidney stones   . Gallstone   . BPH with obstruction/lower urinary tract symptoms   . Elevated PSA   . Testicular pain   . Urinary retention   . Aneurysm ([place])   . Gross hematuria   . Epididymitis   . Nocturia   . Cellulitis of scrotum   . Blockage of a bile duct     Surgical History: Past Surgical History  Procedure Laterality Date  . Ercp N/A 07/09/2015    Procedure: ENDOSCOPIC RETROGRADE CHOLANGIOPANCREATOGRAPHY (ERCP);  Surgeon: Clarene Essex, MD;  Location: [facility]  ENDOSCOPY;  Service: Endoscopy;  Laterality: N/A;  . Cholecystectomy N/A 07/11/2015    Procedure: LAPAROSCOPIC CHOLECYSTECTOMY;  Surgeon: Rolm Bookbinder, MD;  Location: [facility];  Service: General;  Laterality: N/A;    Home Medications:    Medication List       This list is accurate as of: 09/03/15  3:33 PM.  Always use your most recent med list.               amiodarone 200 MG tablet  Commonly known as:  PACERONE  Take 200 mg by mouth daily.     amoxicillin-clavulanate 875-125 MG tablet  Commonly known as:  AUGMENTIN  Take 1 tablet by mouth every 12 (twelve) hours.     aspirin EC 325 MG tablet  Take 325 mg by mouth daily.     diltiazem 180 MG 24 hr capsule  Commonly known as:  CARDIZEM CD  Take 1 capsule (180 mg total) by mouth daily.     finasteride 5 MG tablet  Commonly known as:  PROSCAR  Take 5 mg by mouth daily.     isosorbide mononitrate 30 MG 24 hr tablet  Commonly known as:  IMDUR     multivitamin with minerals Tabs tablet  Take 1 tablet by mouth daily.     nitrofurantoin (macrocrystal-monohydrate) 100 MG capsule  Commonly known as:  MACROBID     nitroGLYCERIN 0.4 MG SL tablet  Commonly known as:  NITROSTAT  ondansetron 4 MG tablet  Commonly known as:  ZOFRAN  Take 1 tablet (4 mg total) by mouth every 6 (six) hours as needed for nausea or vomiting.     oxycodone 5 MG capsule  Commonly known as:  OXY-IR  Take 1 capsule (5 mg total) by mouth every 4 (four) hours as needed.     saccharomyces boulardii 250 MG capsule  Commonly known as:  FLORASTOR  Take 1 capsule (250 mg total) by mouth 2 (two) times daily.     FLORASTOR 250 MG capsule  Generic drug:  saccharomyces boulardii  Take by mouth.     tamsulosin 0.4 MG Caps capsule  Commonly known as:  FLOMAX  Take 0.4 mg by mouth daily.        Allergies:  Allergies  Allergen Reactions  . Clindamycin Hcl     Other reaction(s): Unknown Reaction:  Unknown   . Clindamycin/Lincomycin Other (See  Comments)    Reaction:  Unknown   . Sulfamethoxazole-Trimethoprim Rash    Family History: Family History  Problem Relation Age of Onset  . Kidney disease Neg Hx   . Prostate cancer Neg Hx   . Bladder Cancer Neg Hx     Social History:  reports that he has quit smoking. He does not have any smokeless tobacco history on file. He reports that he does not drink alcohol or use illicit drugs.  ROS:      Physical Exam: Ht 6' (1.829 m)  Wt 84.324 kg (185 lb 14.4 oz)  BMI 25.21 kg/m2    Laboratory Data: Lab Results  Component Value Date   WBC 9.0 07/13/2015   HGB 12.9* 07/13/2015   HCT 39.0 07/13/2015   MCV 94.0 07/13/2015   PLT 160 07/13/2015    Lab Results  Component Value Date   CREATININE 0.89 07/13/2015    Lab Results  Component Value Date   PSA 18.4* 07/18/2015    No results found for: TESTOSTERONE  Lab Results  Component Value Date   HGBA1C 5.6 07/11/2015    Urinalysis    Component Value Date/Time   COLORURINE AMBER* 07/08/2015 0955   COLORURINE Amber 06/21/2014 1800   APPEARANCEUR CLEAR* 07/08/2015 0955   APPEARANCEUR Clear 06/21/2014 1800   LABSPEC 1.028 07/08/2015 0955   LABSPEC 1.019 06/21/2014 1800   PHURINE 5.0 07/08/2015 0955   PHURINE 5.0 06/21/2014 1800   GLUCOSEU Negative 08/07/2015 1505   GLUCOSEU Negative 06/21/2014 1800   HGBUR 3+* 07/08/2015 0955   HGBUR 1+ 06/21/2014 1800   BILIRUBINUR Negative 08/07/2015 [place] 07/08/2015 0955   BILIRUBINUR Negative 06/21/2014 1800   KETONESUR NEGATIVE 07/08/2015 0955   KETONESUR Trace 06/21/2014 1800   PROTEINUR NEGATIVE 07/08/2015 0955   PROTEINUR Negative 06/21/2014 1800   NITRITE Positive* 08/07/2015 1505   NITRITE POSITIVE* 07/08/2015 0955   NITRITE Negative 06/21/2014 1800   LEUKOCYTESUR 2+* 08/07/2015 1505   LEUKOCYTESUR NEGATIVE 07/08/2015 0955   LEUKOCYTESUR Trace 06/21/2014 1800    Pertinent Imaging: none  Assessment & Plan:  I did not perform cystoscopy  today since it did not want a false positive. My plan is to give him Septra DS 1 tablet twice a day for 1 week. He will then start on Macrodantin 100 mg 30 tablets 11. He will come in approximate 2 weeks and have cystoscopy today Dr. Erlene Quan. His bladder was already drawn so we'll send his PSA recognizing could be falsely elevated. Because of allergies I switched him to Omnicef 300 mg  twice a day for a week  1. BPH with obstruction/lower urinary tract symptoms 2. Chronic cystitis   2. Elevated PSA     Reece Packer, MD  [facility] [address], [place], [place] [address] [phone]

## 2015-09-04 LAB — PSA: Prostate Specific Ag, Serum: 8.1 ng/mL — ABNORMAL HIGH (ref 0.0–4.0)

## 2015-09-05 ENCOUNTER — Telehealth: Payer: Self-pay

## 2015-09-05 LAB — CULTURE, URINE COMPREHENSIVE

## 2015-09-05 NOTE — Telephone Encounter (Signed)
Spoke with pt in reference to -ucx. Pt voiced understanding.  

## 2015-09-05 NOTE — Telephone Encounter (Signed)
-----   Message from Roda Shutters, [place] sent at 09/05/2015  3:05 PM EST ----- Please notify patient that his urine culture was positive for infection. The bacteria that grew out is susceptible to the antibiotics that Dr. Vikki Ports prescribed him. He should follow up as previously scheduled for his cystoscopy with Dr. Erlene Quan. Thanks

## 2015-09-17 ENCOUNTER — Ambulatory Visit: Payer: Medicare HMO | Admitting: Urology

## 2015-09-24 ENCOUNTER — Ambulatory Visit (INDEPENDENT_AMBULATORY_CARE_PROVIDER_SITE_OTHER): Payer: Medicare HMO | Admitting: Urology

## 2015-09-24 ENCOUNTER — Encounter: Payer: Self-pay | Admitting: Urology

## 2015-09-24 VITALS — BP 124/70 | HR 74 | Ht 72.0 in | Wt 184.3 lb

## 2015-09-24 DIAGNOSIS — R339 Retention of urine, unspecified: Secondary | ICD-10-CM

## 2015-09-24 DIAGNOSIS — N200 Calculus of kidney: Secondary | ICD-10-CM

## 2015-09-24 DIAGNOSIS — N390 Urinary tract infection, site not specified: Secondary | ICD-10-CM

## 2015-09-24 DIAGNOSIS — N401 Enlarged prostate with lower urinary tract symptoms: Secondary | ICD-10-CM | POA: Diagnosis not present

## 2015-09-24 DIAGNOSIS — N138 Other obstructive and reflux uropathy: Secondary | ICD-10-CM

## 2015-09-24 LAB — MICROSCOPIC EXAMINATION
Bacteria, UA: NONE SEEN
RENAL EPITHEL UA: NONE SEEN /HPF

## 2015-09-24 LAB — URINALYSIS, COMPLETE
BILIRUBIN UA: NEGATIVE
Glucose, UA: NEGATIVE
Leukocytes, UA: NEGATIVE
NITRITE UA: NEGATIVE
PH UA: 5.5 (ref 5.0–7.5)
PROTEIN UA: NEGATIVE
RBC UA: NEGATIVE
Specific Gravity, UA: 1.025 (ref 1.005–1.030)
UUROB: 0.2 mg/dL (ref 0.2–1.0)

## 2015-09-24 MED ORDER — CIPROFLOXACIN HCL 500 MG PO TABS
500.0000 mg | ORAL_TABLET | Freq: Once | ORAL | Status: AC
  Administered 2015-09-24: 500 mg via ORAL

## 2015-09-24 MED ORDER — LIDOCAINE HCL 2 % EX GEL
1.0000 "application " | Freq: Once | CUTANEOUS | Status: AC
  Administered 2015-09-24: 1 via URETHRAL

## 2015-09-24 NOTE — Progress Notes (Addendum)
5:22 PM  09/24/2015   Chris Simon 04/05/1934 [id number]  Referring provider: [name], MD [place] [facility], [place] [address]  Chief Complaint  Patient presents with  . Cysto    HPI: 81 year old white male with history of elevated PSA,  urinary tract infection with sepsis and BPH with LUTS/ incomplete bladder emptying/ history of urinary retention who presents today for office cystoscopy to evaluate his bladder/ prostate.    Elevated PSA He does have a history of elevated PSA reaching as high as 23.8 in 6 2015 at which time he underwent a prostate biopsy which was negative. He had previous other biopsies as well which were also negative. His most recent PSA was down to 8.1 in 08/2015.  Rectal exams enlarged, no masses.     BPH WITH LUTS Currently on Flomax/finasteride chronically.  History of urinary retention requiring Foley catheterization approximately one year ago. Also has a history of incomplete bladder emptying with elevated postvoid residuals in the 100-200 cc range.  Previous TRUS vol 234 cc in 2015 on finasteride.  CT scan of abdomen and pelvis in 10 2016 shows incidental left prostatic irregularity.  No urinary complaints today but question is ability to give accurate voiding history.  Recurrent UTIs Recent hospital admission in 06/2015 for gram-negative rod sepsis/ UTI/ E. coli. Also diagnosed and treated for cholangitis s/p cholecystectomy/ ERCP.  Treated for additional asymptomatic bacteriuria prior to cystoscopy.  Per his daughter today, he's been treated for numerous urinary tract infections over the past few years. No history of bladder stones.   PMH: Past Medical History  Diagnosis Date  . Atrial fibrillation ([place])   . Kidney stones   . Gallstone   . BPH with obstruction/lower urinary tract symptoms   . Elevated PSA   . Testicular pain   . Urinary retention   . Aneurysm ([place])   . Gross hematuria     . Epididymitis   . Nocturia   . Cellulitis of scrotum   . Blockage of a bile duct     Surgical History: Past Surgical History  Procedure Laterality Date  . Ercp N/A 07/09/2015    Procedure: ENDOSCOPIC RETROGRADE CHOLANGIOPANCREATOGRAPHY (ERCP);  Surgeon: Clarene Essex, MD;  [facility] END[facility]COPY;  Service: Endoscopy;  Laterality: N/A;  . Cholecystectomy N/A 07/11/2015    Procedure: LAPAROSCOPIC CHOLECYSTECTOMY;  Surgeon: Rolm Bookbinder, MD;  Location: [place];  Service: General;  Laterality: N/A;    Home Medications:    Medication List       This list is accurate as of: 09/24/15  5:22 PM.  Always use your most recent med list.               amiodarone 200 MG tablet  Commonly known as:  PACERONE  Take 200 mg by mouth daily.     aspirin EC 325 MG tablet  Take 325 mg by mouth daily.     cefdinir 300 MG capsule  Commonly known as:  OMNICEF  Take 1 capsule (300 mg total) by mouth 2 (two) times daily.     diltiazem 180 MG 24 hr capsule  Commonly known as:  CARDIZEM CD  Take 1 capsule (180 mg total) by mouth daily.     finasteride 5 MG tablet  Commonly known as:  PROSCAR  Take 5 mg by mouth daily.     isosorbide mononitrate 30 MG 24 hr tablet  Commonly known as:  IMDUR  multivitamin with minerals Tabs tablet  Take 1 tablet by mouth daily.     nitrofurantoin 100 MG capsule  Commonly known as:  MACRODANTIN  Take 1 capsule (100 mg total) by mouth daily.     nitroGLYCERIN 0.4 MG SL tablet  Commonly known as:  NITROSTAT     ondansetron 4 MG tablet  Commonly known as:  ZOFRAN  Take 1 tablet (4 mg total) by mouth every 6 (six) hours as needed for nausea or vomiting.     oxycodone 5 MG capsule  Commonly known as:  OXY-IR  Take 1 capsule (5 mg total) by mouth every 4 (four) hours as needed.     tamsulosin 0.4 MG Caps capsule  Commonly known as:  FLOMAX  Take 0.4 mg by mouth daily.        Allergies:  Allergies  Allergen Reactions  . Clindamycin Hcl      Other reaction(s): Unknown Reaction:  Unknown   . Clindamycin/Lincomycin Other (See Comments)    Reaction:  Unknown   . Sulfamethoxazole-Trimethoprim Rash    Family History: Family History  Problem Relation Age of Onset  . Kidney disease Neg Hx   . Prostate cancer Neg Hx   . Bladder Cancer Neg Hx     Social History:  reports that he has quit smoking. He does not have any smokeless tobacco history on file. He reports that he does not drink alcohol or use illicit drugs.  Physical Exam: BP 124/70 mmHg  Pulse 74  Ht 6' (1.829 m)  Wt 184 lb 4.8 oz (83.598 kg)  BMI 24.99 kg/m2  Physical Exam  Constitutional: He is oriented to person, place, and time. He appears well-developed and well-nourished.  HENT:  Head: Normocephalic and atraumatic.  Eyes: EOM are normal.  Neck: Normal range of motion.  Pulmonary/Chest: Effort normal.  Abdominal: Soft.  Genitourinary: Penis normal.  Musculoskeletal: Normal range of motion.  Neurological: He is alert and oriented to person, place, and time.  Skin: Skin is warm and dry.  Psychiatric: He has a normal mood and affect.      Laboratory Data: Lab Results  Component Value Date   WBC 9.0 07/13/2015   HGB 12.9* 07/13/2015   HCT 39.0 07/13/2015   MCV 94.0 07/13/2015   PLT 160 07/13/2015    Lab Results  Component Value Date   CREATININE 0.89 07/13/2015    PSA History:  10.4 ng/mL on 09/17/2013  23.8 ng/mL on 03/18/2014  BX negative 04/2014  8.1 ng/mL on 10/03/2014  Lab Results  Component Value Date   PSA 8.1* 09/03/2015   PSA 18.4* 07/18/2015     Lab Results  Component Value Date   HGBA1C 5.6 07/11/2015    Urinalysis: Results for orders placed or performed in visit on 09/24/15  Microscopic Examination  Result Value Ref Range   WBC, UA 0-5 0 -  5 /hpf   RBC, UA 0-2 0 -  2 /hpf   Epithelial Cells (non renal) 0-10 0 - 10 /hpf   Renal Epithel, UA None seen None seen /hpf   Mucus, UA Present (A) Not Estab.   Bacteria,  UA None seen None seen/Few  Urinalysis, Complete  Result Value Ref Range   Specific Gravity, UA 1.025 1.005 - 1.030   pH, UA 5.5 5.0 - 7.5   Color, UA Yellow Yellow   Appearance Ur Clear Clear   Leukocytes, UA Negative Negative   Protein, UA Negative Negative/Trace   Glucose, UA Negative Negative  Ketones, UA Trace (A) Negative   RBC, UA Negative Negative   Bilirubin, UA Negative Negative   Urobilinogen, Ur 0.2 0.2 - 1.0 mg/dL   Nitrite, UA Negative Negative   Microscopic Examination See below:     Pertinent Imaging: Results for PHILO, GLESSNER (MRN [id number]) as of 07/18/2015 11:08  Ref. Range 07/18/2015 10:54  Scan Result Unknown 130    Cystoscopy Procedure Note  Patient identification was confirmed, informed consent was obtained, and patient was prepped using Betadine solution.  Lidocaine jelly was administered per urethral meatus.    Preoperative abx where received prior to procedure.     Pre-Procedure: - Inspection reveals a normal caliber ureteral meatus.  Procedure: The flexible cystoscope was introduced without difficulty - No urethral strictures/lesions are present. - Enlarged prostate with severe trilobar coaptation, bleeding from friable mucosa, 8-9 cm prostatic length - Elevated bladder neck - Bilateral ureteral orifices unable to be identified clearly due to presence of massive median lobe - Bladder mucosa  reveals no ulcers, tumors, or lesions - No bladder stones - HEAVY severe trabeculation, saccules present  Retroflexion shows massive intravesical well-circumscribed median lobe   Post-Procedure: - Patient tolerated the procedure well   Assessment & Plan:    1. BPH with obstruction/lower urinary tract symptoms/ history of retention/ BOO Massive 234 cc prostate appreciated on cystoscopy with severe trilobar coaptation. The bladder is severely/heavily trabeculated without evidence of masses or lesions otherwise. He also has a history of  incomplete bladder emptying and urinary retention as result of his blatter outlet obstruction.     Today, we initiated a conversation about the possibility of reducing his outlet via surgery. Given the size of his gland, I do not feel that he would be a candidate for transurethral resection. We briefly discussed open simple prostatectomy as well as the risks and benefits of this major operation. We also discussed that reducing his outlet may lead to worsening incontinence as well as urgency and frequency symptoms. Patient reports that he has no urinary symptoms today but I question his ability to provide an accurate history.   I have asked he and his daughter to keep a voiding diary for a few days prior to next visit recording the frequency of urination and the volume along with associated symptoms. This will provide a more accurate assessment of his voiding issues. Therefore return in a few weeks to further discuss his options.  - Urinalysis, Complete - ciprofloxacin (CIPRO) tablet 500 mg; Take 1 tablet (500 mg total) by mouth once. - lidocaine (XYLOCAINE) 2 % jelly 1 application; Place 1 application into the urethra once.  2. Incomplete bladder emptying As above   3. Recurrent UTI Likely secondary to #1 and #2  4. Kidney stone on left side 5 mm L nonobstructing stone on CT stone 06/2015, will follow   Hollice Espy, MD  [facility] [address] [place],  [address] [phone]  I spent 25 min with this patient of which greater than 50% was spent in counseling and coordination of care with the patient in addition to the time performing the procedure.

## 2015-10-24 ENCOUNTER — Ambulatory Visit (INDEPENDENT_AMBULATORY_CARE_PROVIDER_SITE_OTHER): Payer: Medicare HMO | Admitting: Urology

## 2015-10-24 VITALS — BP 152/76 | HR 77 | Ht 72.0 in | Wt 186.6 lb

## 2015-10-24 DIAGNOSIS — N401 Enlarged prostate with lower urinary tract symptoms: Secondary | ICD-10-CM | POA: Diagnosis not present

## 2015-10-24 DIAGNOSIS — N200 Calculus of kidney: Secondary | ICD-10-CM

## 2015-10-24 DIAGNOSIS — N390 Urinary tract infection, site not specified: Secondary | ICD-10-CM | POA: Diagnosis not present

## 2015-10-24 DIAGNOSIS — N138 Other obstructive and reflux uropathy: Secondary | ICD-10-CM

## 2015-10-24 LAB — URINALYSIS, COMPLETE
BILIRUBIN UA: NEGATIVE
Glucose, UA: NEGATIVE
KETONES UA: NEGATIVE
Nitrite, UA: POSITIVE — AB
PH UA: 5 (ref 5.0–7.5)
PROTEIN UA: NEGATIVE
SPEC GRAV UA: 1.02 (ref 1.005–1.030)
UUROB: 0.2 mg/dL (ref 0.2–1.0)

## 2015-10-24 LAB — MICROSCOPIC EXAMINATION
Epithelial Cells (non renal): NONE SEEN /hpf (ref 0–10)
RBC, UA: NONE SEEN /hpf (ref 0–?)
WBC, UA: >30 /hpf — ABNORMAL HIGH (ref 0–?)

## 2015-10-24 LAB — BLADDER SCAN AMB NON-IMAGING

## 2015-10-24 MED ORDER — FINASTERIDE 5 MG PO TABS: 5.0000 mg | ORAL_TABLET | Freq: Every day | ORAL | Status: DC

## 2015-10-24 MED ORDER — TAMSULOSIN HCL 0.4 MG PO CAPS
0.4000 mg | ORAL_CAPSULE | Freq: Every day | ORAL | Status: DC
Start: 2015-10-24 — End: 2016-11-30

## 2015-10-24 NOTE — Progress Notes (Signed)
4:37 PM  10/24/2015   Chris Simon 1934-04-21 [id number]  Referring provider: [name], MD [place], [place] [address]  Chief Complaint  Patient presents with  . Benign Prostatic Hypertrophy    35month    HPI: 82 year old white male with history of elevated PSA,  urinary tract infection with sepsis and BPH with LUTS/ incomplete bladder emptying/ history of urinary retention who returns to the office after recent cystoscopy to discuss the possibility of surgical intervention for his massive BPH.  His daughter is also present.    Elevated PSA He does have a history of elevated PSA reaching as high as 23.8 in 6 2015 at which time he underwent a prostate biopsy which was negative. He had previous other biopsies as well which were also negative. His most recent PSA was down to 8.1 in 08/2015.  Rectal exams enlarged, no masses.    BPH WITH LUTS Currently on Flomax/finasteride chronically.  History of urinary retention requiring Foley catheterization approximately one year ago. Also has a history of incomplete bladder emptying with elevated postvoid residuals in the 100-200 cc range.  Previous TRUS vol 234 cc in 2015 on finasteride.  CT scan of abdomen and pelvis in 10 2016 shows incidental left prostatic irregularity.  Cystoscopy 08/2015 shows severe trilobar coaptation, severe heavily trabeculated bladder with well circumscribed massive median lobe retreating into the base of the bladder.  No urinary complaints today but question is ability to give accurate voiding history.  Did not keep voiding diary since last visit as recommended.  Insists that he is asymptomatic.    Recurrent UTIs Recent hospital admission in 06/2015 for gram-negative rod sepsis/ UTI/ E. coli. Also diagnosed and treated for cholangitis s/p cholecystectomy/ ERCP.  Treated for additional asymptomatic bacteriuria prior to cystoscopy.  Per his daughter today,  he's been treated for numerous urinary tract infections over the past few years. No history of bladder stones.       IPSS      10/24/15 1600       International Prostate Symptom Score   How often have you had the sensation of not emptying your bladder? Not at All     How often have you had to urinate less than every two hours? Less than 1 in 5 times     How often have you found you stopped and started again several times when you urinated? Not at All     How often have you found it difficult to postpone urination? Not at All     How often have you had a weak urinary stream? Not at All     How often have you had to strain to start urination? Not at All     How many times did you typically get up at night to urinate? None     Total IPSS Score 1     Quality of Life due to urinary symptoms   If you were to spend the rest of your life with your urinary condition just the way it is now how would you feel about that? Delighted           PMH: Past Medical History  Diagnosis Date  . Atrial fibrillation ([place])   . Kidney stones   . Gallstone   . BPH with obstruction/lower urinary tract symptoms   . Elevated PSA   . Testicular pain   . Urinary retention   . Aneurysm ([place])   .  Gross hematuria   . Epididymitis   . Nocturia   . Cellulitis of scrotum   . Blockage of a bile duct     Surgical History: Past Surgical History  Procedure Laterality Date  . Ercp N/A 07/09/2015    Procedure: ENDOSCOPIC RETROGRADE CHOLANGIOPANCREATOGRAPHY (ERCP);  Surgeon: Clarene Essex, MD;  Location: [facility] ENDOSCOPY;  Service: Endoscopy;  Laterality: N/A;  . Cholecystectomy N/A 07/11/2015    Procedure: LAPAROSCOPIC CHOLECYSTECTOMY;  Surgeon: Rolm Bookbinder, MD;  Location: [facility];  Service: General;  Laterality: N/A;    Home Medications:    Medication List       This list is accurate as of: 10/24/15  4:37 PM.  Always use your most recent med list.               amiodarone 200 MG tablet  Commonly  known as:  PACERONE  Take 200 mg by mouth daily.     aspirin EC 325 MG tablet  Take 325 mg by mouth daily.     cefdinir 300 MG capsule  Commonly known as:  OMNICEF  Take 1 capsule (300 mg total) by mouth 2 (two) times daily.     diltiazem 180 MG 24 hr capsule  Commonly known as:  CARDIZEM CD  Take 1 capsule (180 mg total) by mouth daily.     finasteride 5 MG tablet  Commonly known as:  PROSCAR  Take 5 mg by mouth daily.     isosorbide mononitrate 30 MG 24 hr tablet  Commonly known as:  IMDUR     multivitamin with minerals Tabs tablet  Take 1 tablet by mouth daily.     nitrofurantoin 100 MG capsule  Commonly known as:  MACRODANTIN  Take 1 capsule (100 mg total) by mouth daily.     nitroGLYCERIN 0.4 MG SL tablet  Commonly known as:  NITROSTAT     ondansetron 4 MG tablet  Commonly known as:  ZOFRAN  Take 1 tablet (4 mg total) by mouth every 6 (six) hours as needed for nausea or vomiting.     oxycodone 5 MG capsule  Commonly known as:  OXY-IR  Take 1 capsule (5 mg total) by mouth every 4 (four) hours as needed.     rosuvastatin 20 MG tablet  Commonly known as:  CRESTOR     tamsulosin 0.4 MG Caps capsule  Commonly known as:  FLOMAX  Take 0.4 mg by mouth daily.        Allergies:  Allergies  Allergen Reactions  . Clindamycin Hcl     Other reaction(s): Unknown Reaction:  Unknown   . Clindamycin/Lincomycin Other (See Comments)    Reaction:  Unknown   . Sulfamethoxazole-Trimethoprim Rash    Family History: Family History  Problem Relation Age of Onset  . Kidney disease Neg Hx   . Prostate cancer Neg Hx   . Bladder Cancer Neg Hx     Social History:  reports that he has quit smoking. He does not have any smokeless tobacco history on file. He reports that he does not drink alcohol or use illicit drugs.  Physical Exam: BP 152/76 mmHg  Pulse 77  Ht 6' (1.829 m)  Wt 186 lb 9.6 oz (84.641 kg)  BMI 25.30 kg/m2  Physical Exam  Constitutional: He is oriented  to person, place, and time. He appears well-developed and well-nourished.  Presents with daughter today  HENT:  Head: Normocephalic and atraumatic.  Eyes: EOM are normal.  Neck: Normal range of motion.  Pulmonary/Chest: Effort normal.  Musculoskeletal: Normal range of motion.  Neurological: He is alert and oriented to person, place, and time.  Skin: Skin is warm and dry.  Psychiatric: He has a normal mood and affect.    Laboratory Data: Lab Results  Component Value Date   WBC 9.0 07/13/2015   HGB 12.9* 07/13/2015   HCT 39.0 07/13/2015   MCV 94.0 07/13/2015   PLT 160 07/13/2015    Lab Results  Component Value Date   CREATININE 0.89 07/13/2015    PSA History:  10.4 ng/mL on 09/17/2013  23.8 ng/mL on 03/18/2014  BX negative 04/2014  8.1 ng/mL on 09/03/2015   Lab Results  Component Value Date   HGBA1C 5.6 07/11/2015    Urinalysis: Results for orders placed or performed in visit on 10/24/15  Bladder Scan (Post Void Residual) in office  Result Value Ref Range   Scan Result 74ml     Labs/ Bladder scan: Results for orders placed or performed in visit on 10/24/15  Microscopic Examination  Result Value Ref Range   WBC, UA >30 (H) 0 -  5 /hpf   RBC, UA None seen 0 -  2 /hpf   Epithelial Cells (non renal) None seen 0 - 10 /hpf   Bacteria, UA Many (A) None seen/Few  Urinalysis, Complete  Result Value Ref Range   Specific Gravity, UA 1.020 1.005 - 1.030   pH, UA 5.0 5.0 - 7.5   Color, UA Yellow Yellow   Appearance Ur Cloudy (A) Clear   Leukocytes, UA 2+ (A) Negative   Protein, UA Negative Negative/Trace   Glucose, UA Negative Negative   Ketones, UA Negative Negative   RBC, UA Trace (A) Negative   Bilirubin, UA Negative Negative   Urobilinogen, Ur 0.2 0.2 - 1.0 mg/dL   Nitrite, UA Positive (A) Negative   Microscopic Examination See below:   Bladder Scan (Post Void Residual) in office  Result Value Ref Range   Scan Result 22ml     Assessment & Plan:    1.  BPH with obstruction/lower urinary tract symptoms/ history of retention/ BOO Massive 234 cc prostate appreciated on cystoscopy with severe trilobar coaptation. The bladder is severely/heavily trabeculated without evidence of masses or lesions otherwise. He also has a history of incomplete bladder emptying and history urinary retention as result of his bladder outlet obstruction.  Preserved renal function and asymptomatic.  PVR to improved from previous.    Discussed options including simple open suprapubic prostatectomy length given the size of his gland. Again, he appears to be chronically obstructed, however, completely asymptomatic although the patient is a difficult historian.  After lengthy discussion, he is absolutely not interested in surgical intervention. His daughter was present for today's conversation is agreeable with this plan.   2. Recurrent UTI Likely secondary to #1  Currently asymptomatic therefore will not treat Plan to only treat symptomatic urinary tract infections. We reviewed indications for treatment today including dysuria, hematuria, increased urinary symptoms, lower abdominal pain, fevers, chills, or altered mental status.  3. Kidney stone on left side 5 mm L nonobstructing stone on CT stone 06/2015, will follow Plan for KUB next visit   Return in about 6 months (around 04/22/2016) for PVR, IPSS, UA.   Hollice Espy, MD  [facility] [address] [place], [place] [address] [phone]

## 2015-10-28 ENCOUNTER — Encounter: Payer: Self-pay | Admitting: Urology

## 2016-03-25 ENCOUNTER — Ambulatory Visit (INDEPENDENT_AMBULATORY_CARE_PROVIDER_SITE_OTHER): Payer: Medicare HMO | Admitting: Urology

## 2016-03-25 ENCOUNTER — Ambulatory Visit
Admission: RE | Admit: 2016-03-25 | Discharge: 2016-03-25 | Disposition: A | Discharge status: Home / Self Care | Payer: Medicare HMO | Source: Ambulatory Visit | Attending: Urology | Admitting: Urology

## 2016-03-25 ENCOUNTER — Encounter: Payer: Self-pay | Admitting: Urology

## 2016-03-25 VITALS — BP 179/68 | HR 76 | Ht 72.0 in | Wt 192.0 lb

## 2016-03-25 DIAGNOSIS — N200 Calculus of kidney: Secondary | ICD-10-CM | POA: Diagnosis not present

## 2016-03-25 DIAGNOSIS — R310 Gross hematuria: Secondary | ICD-10-CM

## 2016-03-25 DIAGNOSIS — N138 Other obstructive and reflux uropathy: Secondary | ICD-10-CM

## 2016-03-25 DIAGNOSIS — N401 Enlarged prostate with lower urinary tract symptoms: Secondary | ICD-10-CM

## 2016-03-25 LAB — MICROSCOPIC EXAMINATION: EPITHELIAL CELLS (NON RENAL): NONE SEEN /HPF (ref 0–10)

## 2016-03-25 LAB — URINALYSIS, COMPLETE
Bilirubin, UA: NEGATIVE
Glucose, UA: NEGATIVE
Ketones, UA: NEGATIVE
NITRITE UA: NEGATIVE
PH UA: 5.5 (ref 5.0–7.5)
Specific Gravity, UA: >1.03 — ABNORMAL HIGH (ref 1.005–1.030)
UUROB: 0.2 mg/dL (ref 0.2–1.0)

## 2016-03-26 ENCOUNTER — Telehealth: Payer: Self-pay

## 2016-03-26 DIAGNOSIS — N390 Urinary tract infection, site not specified: Secondary | ICD-10-CM

## 2016-03-26 NOTE — Telephone Encounter (Signed)
LMOM-for pt LMOM- for daughter-stone in left kidney is unchanged

## 2016-03-26 NOTE — Telephone Encounter (Signed)
-----   Message from [name], PA-C sent at 03/25/2016  5:04 PM EDT ----- Please call the patient and the patient's daughter and let them know that the stone in the left kidney is unchanged.

## 2016-03-27 LAB — CULTURE, URINE COMPREHENSIVE

## 2016-03-28 NOTE — Progress Notes (Signed)
03/25/2016 12:18 AM   Chris Simon 1934-02-12 [id number]  Referring provider: [name], MD [place], [place] [address]  Chief Complaint  Patient presents with  . Hematuria    HPI: Patient is a 82 -year-old Caucasian male who presents today after having one episode of painless gross hematuria. Patient has a prior history of hematuria.    He does have a prior history of recurrent urinary tract infections, nephrolithiasis and BPH.  He does not have a family medical history of nephrolithiasis, malignancies of the genitourinary tract or hematuria.   Today, he is not having symptoms of frequent urination, urgency, dysuria, nocturia, incontinence, hesitancy, intermittency, straining to urinate or a weak urinary stream.  His UA today demonstrates 0-2 RBC's, > 30 WBC's and moderate bacteria per high-powered field.  He is not experiencing any suprapubic pain, abdominal pain or flank pain. He denies any recent fevers, chills, nausea or vomiting.   He underwent a renal stone study and on October 2016. He was found to have an enlarged prostate and a nonobstructing left renal stone.  As underwent cystoscopic be in December 2016 was found to have bleeding from a friable mucosa of the prostate.  He is a former smoker.   PMH: Past Medical History  Diagnosis Date  . Atrial fibrillation ([place])   . Kidney stones   . Gallstone   . BPH with obstruction/lower urinary tract symptoms   . Elevated PSA   . Testicular pain   . Urinary retention   . Aneurysm ([place])   . Gross hematuria   . Epididymitis   . Nocturia   . Cellulitis of scrotum   . Blockage of a bile duct     Surgical History: Past Surgical History  Procedure Laterality Date  . Ercp N/A 07/09/2015    Procedure: ENDOSCOPIC RETROGRADE CHOLANGIOPANCREATOGRAPHY (ERCP);  Surgeon: Clarene Essex, MD;  Location: [facility] END[facility]COPY;  Service: Endoscopy;  Laterality: N/A;  .  Cholecystectomy N/A 07/11/2015    Procedure: LAPAROSCOPIC CHOLECYSTECTOMY;  Surgeon: Rolm Bookbinder, MD;  Location: [facility];  Service: General;  Laterality: N/A;    Home Medications:    Medication List       This list is accurate as of: 03/25/16 11:59 PM.  Always use your most recent med list.               amiodarone 200 MG tablet  Commonly known as:  PACERONE  Take 200 mg by mouth daily.     aspirin EC 325 MG tablet  Take 325 mg by mouth daily.     finasteride 5 MG tablet  Commonly known as:  PROSCAR  Take 1 tablet (5 mg total) by mouth daily.     isosorbide mononitrate 30 MG 24 hr tablet  Commonly known as:  IMDUR     multivitamin with minerals Tabs tablet  Take 1 tablet by mouth daily.     nitroGLYCERIN 0.4 MG SL tablet  Commonly known as:  NITROSTAT     ondansetron 4 MG tablet  Commonly known as:  ZOFRAN  Take 1 tablet (4 mg total) by mouth every 6 (six) hours as needed for nausea or vomiting.     rosuvastatin 20 MG tablet  Commonly known as:  CRESTOR     tamsulosin 0.4 MG Caps capsule  Commonly known as:  FLOMAX  Take 1 capsule (0.4 mg total) by mouth daily.        Allergies:  Allergies  Allergen Reactions  . Clindamycin Hcl     Other reaction(s): Unknown Reaction:  Unknown   . Clindamycin/Lincomycin Other (See Comments)    Reaction:  Unknown   . Sulfamethoxazole-Trimethoprim Rash    Family History: Family History  Problem Relation Age of Onset  . Kidney disease Neg Hx   . Prostate cancer Neg Hx   . Bladder Cancer Neg Hx     Social History:  reports that he has quit smoking. He does not have any smokeless tobacco history on file. He reports that he does not drink alcohol or use illicit drugs.  ROS: UROLOGY Frequent Urination?: No Hard to postpone urination?: No Burning/pain with urination?: No Get up at night to urinate?: No Leakage of urine?: No Urine stream starts and stops?: No Trouble starting stream?: No Do you have to strain  to urinate?: No Blood in urine?: Yes Urinary tract infection?: No Sexually transmitted disease?: No Injury to kidneys or bladder?: No Painful intercourse?: No Weak stream?: No Erection problems?: No Penile pain?: No  Gastrointestinal Nausea?: No Vomiting?: No Indigestion/heartburn?: No Diarrhea?: No Constipation?: No  Constitutional Fever: No Night sweats?: No Weight loss?: No Fatigue?: No  Skin Skin rash/lesions?: No Itching?: No  Eyes Blurred vision?: No Double vision?: No  Ears/Nose/Throat Sore throat?: No Sinus problems?: No  Hematologic/Lymphatic Swollen glands?: No Easy bruising?: No  Cardiovascular Leg swelling?: Yes Chest pain?: No  Respiratory Cough?: No Shortness of breath?: No  Endocrine Excessive thirst?: No  Musculoskeletal Back pain?: No Joint pain?: Yes  Neurological Headaches?: No Dizziness?: No  Psychologic Depression?: No Anxiety?: No  Physical Exam: BP 179/68 mmHg  Pulse 76  Ht 6' (1.829 m)  Wt 192 lb (87.091 kg)  BMI 26.03 kg/m2  Constitutional: Well nourished. Alert and oriented, No acute distress. HEENT: [place] AT, moist mucus membranes. Trachea midline, no masses. Cardiovascular: No clubbing, cyanosis, or edema. Respiratory: Normal respiratory effort, no increased work of breathing. GI: Abdomen is soft, non tender, non distended, no abdominal masses. Liver and spleen not palpable.  No hernias appreciated.  Stool sample for occult testing is not indicated.   GU: No CVA tenderness.  No bladder fullness or masses.  Patient with circumcised phallus.  Urethral meatus is patent.  No penile discharge. No penile lesions or rashes. Scrotum without lesions, cysts, rashes and/or edema.  Testicles are located scrotally bilaterally. No masses are appreciated in the testicles. Left and right epididymis are normal. Rectal: Patient with  normal sphincter tone. Anus and perineum without scarring or rashes. No rectal masses are appreciated.  Prostate is approximately 80 grams, no nodules are appreciated. Seminal vesicles are normal. Skin: No rashes, bruises or suspicious lesions. Lymph: No cervical or inguinal adenopathy. Neurologic: Grossly intact, no focal deficits, moving all 4 extremities. Psychiatric: Normal mood and affect.  Laboratory Data: Lab Results  Component Value Date   WBC 9.0 07/13/2015   HGB 12.9* 07/13/2015   HCT 39.0 07/13/2015   MCV 94.0 07/13/2015   PLT 160 07/13/2015    Lab Results  Component Value Date   CREATININE 0.89 07/13/2015    Lab Results  Component Value Date   HGBA1C 5.6 07/11/2015    Lab Results  Component Value Date   TSH 0.343* 06/22/2014     Lab Results  Component Value Date   AST 26 07/13/2015   Lab Results  Component Value Date   ALT 34 07/13/2015    Urinalysis Results for orders placed or performed in visit on 03/25/16  CULTURE, URINE COMPREHENSIVE  Result Value Ref Range   Urine Culture, Comprehensive Final report (A)    Result 1 Escherichia coli (A)    ANTIMICROBIAL SUSCEPTIBILITY Comment   Microscopic Examination  Result Value Ref Range   WBC, UA >30 (A) 0 -  5 /hpf   RBC, UA 0-2 0 -  2 /hpf   Epithelial Cells (non renal) None seen 0 - 10 /hpf   Bacteria, UA Moderate (A) None seen/Few  Urinalysis, Complete  Result Value Ref Range   Specific Gravity, UA >1.030 (H) 1.005 - 1.030   pH, UA 5.5 5.0 - 7.5   Color, UA Yellow Yellow   Appearance Ur Cloudy (A) Clear   Leukocytes, UA 3+ (A) Negative   Protein, UA Trace (A) Negative/Trace   Glucose, UA Negative Negative   Ketones, UA Negative Negative   RBC, UA 1+ (A) Negative   Bilirubin, UA Negative Negative   Urobilinogen, Ur 0.2 0.2 - 1.0 mg/dL   Nitrite, UA Negative Negative   Microscopic Examination See below:     Pertinent Imaging: CLINICAL DATA: Known left kidney stone  EXAM: ABDOMEN - 1 VIEW  COMPARISON: Abdominal and pelvic CT stone study of August 08, 2015.  FINDINGS: There is  a stable approximately 1 x 3 mm stone projecting over the lower pole of the left kidney. There are no definite right renal stones. No ureteral or bladder stones are observed. The bowel gas pattern is unremarkable. No acute bony abnormality is observed. There are surgical clips in the gallbladder fossa.  IMPRESSION: Stable approximately 1 x 3 mm lower pole left renal stone.   Electronically Signed  By: David Martinique M.D.  On: 03/25/2016 15:03  Assessment & Plan:    1. Gross hematuria:   Patient had one episode of painless gross hematuria.  He has a known history of a renal stone and a friable prostatic mucosa.  UA also look suspicious for infection.  I will send the urine for culture, if it is positive we will treat accordingly. We'll then recheck the UA to make sure the hematuria does not return.  If urine is negative for infection, his daughter would like to pursue a hematuria workup.  - Urinalysis, Complete - CULTURE, URINE COMPREHENSIVE  2. Renal stone:   Joaquim Lai is stable.  - Abdomen 1 view (KUB); Future  3. BPH with obstruction/lower urinary tract symptoms/ history of retention/ BOO Massive 234 cc prostate appreciated on cystoscopy with severe trilobar coaptation. The bladder is severely/heavily trabeculated without evidence of masses or lesions otherwise. He also has a history of incomplete bladder emptying and history urinary retention as result of his bladder outlet obstruction. Preserved renal function and asymptomatic. PVR to improved from previous. Dr. Erlene Quan discussed options including simple open suprapubic prostatectomy length given the size of his gland. Again, he appears to be chronically obstructed, however, completely asymptomatic although the patient is a difficult historian. After lengthy discussion, he is absolutely not interested in surgical intervention. His daughter was present for today's conversation is agreeable with this plan.  Return for pending urine  culture results.  These notes generated with voice recognition software. I apologize for typographical errors.  Zara Council, [facility] [address] [place], [place] [address] [phone]

## 2016-03-29 MED ORDER — AMOXICILLIN-POT CLAVULANATE 875-125 MG PO TABS: 1.0000 | ORAL_TABLET | Freq: Two times a day (BID) | ORAL | Status: AC

## 2016-03-29 NOTE — Telephone Encounter (Signed)
-----   Message from [name], PA-C sent at 03/28/2016 12:21 AM EDT ----- Please call the patient's daughter and let her know that her father has a urinary tract infection. We will need to start Augmentin 875/125 twice daily for 7 days.  Then he will need to return at the end of July for PVR, creatinine and IPSS.

## 2016-03-29 NOTE — Telephone Encounter (Signed)
Spoke with pt daughter in reference to +ucx, f/u appt, and kidney stone. Daughter voiced understanding. Transferred to the front to make f/u appt. Medication sent to pharmacy.

## 2016-04-05 ENCOUNTER — Telehealth: Payer: Self-pay

## 2016-04-05 NOTE — Telephone Encounter (Signed)
Previously handled.

## 2016-04-05 NOTE — Telephone Encounter (Signed)
-----   Message from [name], PA-C sent at 03/25/2016  5:04 PM EDT ----- Please call the patient and the patient's daughter and let them know that the stone in the left kidney is unchanged.

## 2016-04-23 ENCOUNTER — Ambulatory Visit (INDEPENDENT_AMBULATORY_CARE_PROVIDER_SITE_OTHER): Payer: Medicare HMO | Admitting: Urology

## 2016-04-23 ENCOUNTER — Encounter: Payer: Self-pay | Admitting: Urology

## 2016-04-23 ENCOUNTER — Ambulatory Visit: Payer: Medicare HMO | Admitting: Urology

## 2016-04-23 VITALS — BP 152/78 | HR 73 | Ht 72.0 in | Wt 192.3 lb

## 2016-04-23 DIAGNOSIS — N390 Urinary tract infection, site not specified: Secondary | ICD-10-CM | POA: Diagnosis not present

## 2016-04-23 DIAGNOSIS — N200 Calculus of kidney: Secondary | ICD-10-CM

## 2016-04-23 DIAGNOSIS — N401 Enlarged prostate with lower urinary tract symptoms: Secondary | ICD-10-CM | POA: Diagnosis not present

## 2016-04-23 DIAGNOSIS — Z87898 Personal history of other specified conditions: Secondary | ICD-10-CM | POA: Diagnosis not present

## 2016-04-23 DIAGNOSIS — N138 Other obstructive and reflux uropathy: Secondary | ICD-10-CM

## 2016-04-23 LAB — URINALYSIS, COMPLETE
BILIRUBIN UA: NEGATIVE
Glucose, UA: NEGATIVE
NITRITE UA: POSITIVE — AB
PROTEIN UA: NEGATIVE
Urobilinogen, Ur: 0.2 mg/dL (ref 0.2–1.0)
pH, UA: 5.5 (ref 5.0–7.5)

## 2016-04-23 LAB — MICROSCOPIC EXAMINATION: RBC MICROSCOPIC, UA: NONE SEEN /HPF (ref 0–?)

## 2016-04-23 LAB — BLADDER SCAN AMB NON-IMAGING: SCAN RESULT: 62

## 2016-04-23 NOTE — Progress Notes (Signed)
4:26 PM  04/23/16   Chris Simon April 24, 1934 [id number]  Referring provider: [name], MD [place], [place] [address]  Chief Complaint  Patient presents with  . Follow-up    BPH    HPI: 82 year old white male with history of elevated PSA,  urinary tract infection with sepsis and BPH with LUTS/ incomplete bladder emptying/ history of urinary retention.  Elevated PSA He does have a history of elevated PSA reaching as high as 23.8 in 02/2014 at which time he underwent a prostate biopsy which was negative. He had previous other biopsies as well which were also negative. His most recent PSA was down to 8.1 in 08/2015.  Rectal exams enlarged, no masses.    BPH WITH LUTS Currently on Flomax/finasteride chronically.  History of urinary retention requiring Foley catheterization approximately one year ago. Also has a history of incomplete bladder emptying with elevated postvoid residuals in the 100-200 cc range.  Previous TRUS vol 234 cc in 2015 on finasteride.  CT scan of abdomen and pelvis in 10 2016 shows incidental left prostatic irregularity.  Cystoscopy 08/2015 shows severe trilobar coaptation, severe heavily trabeculated bladder with well circumscribed massive median lobe retreating into the base of the bladder.  No urinary complaints today, IPSS 0/0.   Recurrent UTIs Recent hospital admission in 06/2015 for gram-negative rod sepsis/ UTI/ E. coli. He's been treated for numerous urinary tract infections over the past few years. No history of bladder stones.  Treated last month for UTI, symptoms included confusion and blood in the urine. Symptoms resolved with abx.  UCx grew E. Coli.         IPSS    Row Name 04/23/16 1400         International Prostate Symptom Score   How often have you had the sensation of not emptying your bladder? Not at All     How often have you had to urinate less than every two hours? Not at  All     How often have you found you stopped and started again several times when you urinated? Not at All     How often have you found it difficult to postpone urination? Not at All     How often have you had a weak urinary stream? Not at All     How often have you had to strain to start urination? Not at All     How many times did you typically get up at night to urinate? None     Total IPSS Score 0       Quality of Life due to urinary symptoms   If you were to spend the rest of your life with your urinary condition just the way it is now how would you feel about that? Delighted          PMH: Past Medical History:  Diagnosis Date  . Aneurysm ([place])   . Atrial fibrillation ([place])   . Blockage of a bile duct   . BPH with obstruction/lower urinary tract symptoms   . Cellulitis of scrotum   . Elevated PSA   . Epididymitis   . Gallstone   . Gross hematuria   . Kidney stones   . Nocturia   . Testicular pain   . Urinary retention     Surgical History: Past Surgical History:  Procedure Laterality Date  . CHOLECYSTECTOMY N/A 07/11/2015   Procedure: LAPAROSCOPIC CHOLECYSTECTOMY;  Surgeon: Rodman Key  Donne Hazel, MD;  Location: [facility];  Service: General;  Laterality: N/A;  . ERCP N/A 07/09/2015   Procedure: ENDOSCOPIC RETROGRADE CHOLANGIOPANCREATOGRAPHY (ERCP);  Surgeon: Clarene Essex, MD;  Location: [facility] ENDOSCOPY;  Service: Endoscopy;  Laterality: N/A;    Home Medications:    Medication List       Accurate as of 04/23/16  4:26 PM. Always use your most recent med list.          amiodarone 200 MG tablet Commonly known as:  PACERONE Take 200 mg by mouth daily.   aspirin EC 325 MG tablet Take 325 mg by mouth daily.   finasteride 5 MG tablet Commonly known as:  PROSCAR Take 1 tablet (5 mg total) by mouth daily.   isosorbide mononitrate 30 MG 24 hr tablet Commonly known as:  IMDUR   multivitamin with minerals Tabs tablet Take 1 tablet by mouth daily.   nitroGLYCERIN 0.4 MG  SL tablet Commonly known as:  NITROSTAT   ondansetron 4 MG tablet Commonly known as:  ZOFRAN Take 1 tablet (4 mg total) by mouth every 6 (six) hours as needed for nausea or vomiting.   rosuvastatin 20 MG tablet Commonly known as:  CRESTOR   tamsulosin 0.4 MG Caps capsule Commonly known as:  FLOMAX Take 1 capsule (0.4 mg total) by mouth daily.       Allergies:  Allergies  Allergen Reactions  . Clindamycin Hcl     Other reaction(s): Unknown Reaction:  Unknown   . Clindamycin/Lincomycin Other (See Comments)    Reaction:  Unknown   . Sulfamethoxazole-Trimethoprim Rash    Family History: Family History  Problem Relation Age of Onset  . Kidney disease Neg Hx   . Prostate cancer Neg Hx   . Bladder Cancer Neg Hx     Social History:  reports that he has quit smoking. He has quit using smokeless tobacco. He reports that he does not drink alcohol or use drugs.  Physical Exam: BP (!) 152/78 (BP Location: Right Arm, Patient Position: Sitting, Cuff Size: Normal)   Pulse 73   Ht 6' (1.829 m)   Wt 192 lb 4.8 oz (87.2 kg)   BMI 26.08 kg/m   Physical Exam  Constitutional: He is oriented to person, place, and time. He appears well-developed and well-nourished.  Presents with daughter today  HENT:  Head: Normocephalic and atraumatic.  Eyes: EOM are normal.  Neck: Normal range of motion.  Pulmonary/Chest: Effort normal.  Musculoskeletal: Normal range of motion.  Neurological: He is alert and oriented to person, place, and time.  Skin: Skin is warm and dry.  Psychiatric: He has a normal mood and affect.    Laboratory Data:  Cr 1.1 on 03/2016  PSA History:  10.4 ng/mL on 09/17/2013  23.8 ng/mL on 03/18/2014  BX negative 04/2014  8.1 ng/mL on 09/03/2015         Lab Results  Component Value Date   HGBA1C 5.6 07/11/2015    Urinalysis: UA today with White blood cells per high-power field, no red blood cells, moderate bacteria, nitirite +.  Labs/ Bladder  scan: Results for orders placed or performed in visit on 04/23/16  Bladder Scan (Post Void Residual) in office  Result Value Ref Range   Scan Result 62     Assessment & Plan:    1. BPH with obstruction/lower urinary tract symptoms/ history of retention/ BOO Massive 234 cc prostate appreciated on cystoscopy 09/2015 with severe trilobar coaptation.,severely/heavily trabeculated bladder.  History of incomplete bladder emptying  and history urinary retention as result of his bladder outlet obstruction.  Preserved renal function and asymptomatic.  PVR today 62 cc today.   Continue finasteride and flomax. Asymptomatic, IPSS 0/0.   Does not desire surgical intervention.  2. Recurrent UTI Likely secondary to #1  Treated last month for infection- AMS UA today appears c/w chronic colonization Plan to only treat symptomatic urinary tract infections ONLY, will sent culture today only for the purpose of knowing bacterial resistance if needs treatment in the future  3. Kidney stone on left side 5 mm L nonobstructing stone on CT stone 06/2015, stable KUB 02/2016 Asymptomatic  4. History of elevated PSA Advised to stop PSA screening given his age- he is status post negative biopsy of his PSAs trended back down to his baseline PSA in fact is fairly appropriate based on prostate volume and chronic bacterial colonization Daughter was very upset by this and feels that we are discriminating against his age  AUA guidelines were reviewed with the patient and his daughter Plan to revisit at next appt  Return in about 1 year (around 04/23/2017) for UA/ IPSS/ KUB.   Hollice Espy, MD  [facility] [address] [place], [place] [address] [phone]

## 2016-04-26 LAB — CULTURE, URINE COMPREHENSIVE

## 2016-05-10 ENCOUNTER — Telehealth: Payer: Self-pay | Admitting: Urology

## 2016-05-10 DIAGNOSIS — N390 Urinary tract infection, site not specified: Secondary | ICD-10-CM

## 2016-05-10 MED ORDER — AMOXICILLIN-POT CLAVULANATE 875-125 MG PO TABS: 1.0000 | ORAL_TABLET | Freq: Two times a day (BID) | ORAL | 0 refills | Status: DC

## 2016-05-10 NOTE — Telephone Encounter (Signed)
Pt is showing signs of UTI, confusion, hot flashes.  Pt's daughter said he already has bacteria in his urine and was told if he started showing symptoms we could call him in prescription.  Please give Chris Simon a call [phone].

## 2016-05-10 NOTE — Telephone Encounter (Signed)
Please treat with Augmentin x 7 days.  Script sent to pharmacy.  Please call to make patient aware.    Hollice Espy, MD

## 2016-05-10 NOTE — Telephone Encounter (Signed)
Spoke with pt daughter in reference to abx being sent to pharmacy. [name] voiced understanding.

## 2016-06-22 ENCOUNTER — Ambulatory Visit (INDEPENDENT_AMBULATORY_CARE_PROVIDER_SITE_OTHER): Payer: Medicare HMO | Admitting: General Surgery

## 2016-06-22 ENCOUNTER — Encounter: Payer: Self-pay | Admitting: General Surgery

## 2016-06-22 VITALS — BP 148/75 | HR 65 | Temp 97.8°F | Ht 72.0 in | Wt 190.0 lb

## 2016-06-22 DIAGNOSIS — K429 Umbilical hernia without obstruction or gangrene: Secondary | ICD-10-CM

## 2016-06-22 DIAGNOSIS — K4090 Unilateral inguinal hernia, without obstruction or gangrene, not specified as recurrent: Secondary | ICD-10-CM

## 2016-06-22 NOTE — Progress Notes (Signed)
Patient ID: Chris Simon, male   DOB: [birth year], 82 y.o.   MRN: [id number]  CC: Bulge  HPI Chris Simon is a 82 y.o. male presents to clinic today for evaluation of a left groin bulge. Patient states he noticed it about a week ago while taking a bath. He has not had any pain. He cannot provide any eliciting activity that caused a hernia to form because he is unsure. He only notices it when he is naked and looks himself. He states it always been soft and easily goes back in. His most strenuous activity is yardwork which she still performed. Otherwise he states he is retired. He denies any fevers, chills, nausea, vomiting, chest pain, shortness breath, diarrhea, constipation. He is appropriate and conversant during the visit.  HPI  Past Medical History:  Diagnosis Date  . Aneurysm ([place])   . Atrial fibrillation ([place])   . Blockage of a bile duct   . BPH with obstruction/lower urinary tract symptoms   . Cellulitis of scrotum   . Elevated PSA   . Epididymitis   . Gallstone   . Gross hematuria   . Kidney stones   . Nocturia   . Testicular pain   . Urinary retention     Past Surgical History:  Procedure Laterality Date  . CHOLECYSTECTOMY N/A 07/11/2015   Procedure: LAPAROSCOPIC CHOLECYSTECTOMY;  Surgeon: Rolm Bookbinder, MD;  Location: [facility];  Service: General;  Laterality: N/A;  . ERCP N/A 07/09/2015   Procedure: ENDOSCOPIC RETROGRADE CHOLANGIOPANCREATOGRAPHY (ERCP);  Surgeon: Clarene Essex, MD;  Location: [facility] ENDOSCOPY;  Service: Endoscopy;  Laterality: N/A;    Family History  Problem Relation Age of Onset  . Prostate cancer Brother   . Kidney disease Neg Hx   . Bladder Cancer Neg Hx     Social History Social History  Substance Use Topics  . Smoking status: Former Research scientist (life sciences)  . Smokeless tobacco: Former Systems developer     Comment: quit 40 years  . Alcohol use No    Allergies  Allergen Reactions  . Clindamycin Hcl     Other reaction(s): Unknown Reaction:  Unknown   .  Clindamycin/Lincomycin Other (See Comments)    Reaction:  Unknown   . Sulfamethoxazole-Trimethoprim Rash    Current Outpatient Prescriptions  Medication Sig Dispense Refill  . amiodarone (PACERONE) 200 MG tablet Take 200 mg by mouth daily.    Marland Kitchen aspirin EC 325 MG tablet Take 325 mg by mouth daily.    . finasteride (PROSCAR) 5 MG tablet Take 1 tablet (5 mg total) by mouth daily. 30 tablet 11  . Multiple Vitamin (MULTIVITAMIN WITH MINERALS) TABS tablet Take 1 tablet by mouth daily.    . nitroGLYCERIN (NITROSTAT) 0.4 MG SL tablet     . rosuvastatin (CRESTOR) 20 MG tablet     . tamsulosin (FLOMAX) 0.4 MG CAPS capsule Take 1 capsule (0.4 mg total) by mouth daily. 30 capsule 11   No current facility-administered medications for this visit.      Review of Systems A Multi-point review of systems was asked and was negative except for the findings documented in the history of present illness  Physical Exam Blood pressure (!) 148/75, pulse 65, temperature 97.8 F (36.6 C), temperature source Oral, height 6' (1.829 m), weight 86.2 kg (190 lb). CONSTITUTIONAL: No acute distress. EYES: Pupils are equal, round, and reactive to light, Sclera are non-icteric. EARS, NOSE, MOUTH AND THROAT: The oropharynx is clear. The oral mucosa is pink and moist. Hearing is intact  to voice. LYMPH NODES:  Lymph nodes in the neck are normal. RESPIRATORY:  Lungs are clear. There is normal respiratory effort, with equal breath sounds bilaterally, and without pathologic use of accessory muscles. CARDIOVASCULAR: Heart is regular without murmurs, gallops, or rubs. GI: The abdomen is soft, nontender, and nondistended. There are visible umbilical and left inguinal hernias exam without palpation. Both areas are soft and easily reducible without tenderness. There are multiple well-healed laparoscopic incision sites on the upper abdomen. There is no hepatosplenomegaly. There are normal bowel sounds in all quadrants. GU: Rectal  deferred.   MUSCULOSKELETAL: Normal muscle strength and tone. No cyanosis or edema.   SKIN: Turgor is good and there are no pathologic skin lesions or ulcers. NEUROLOGIC: Motor and sensation is grossly normal. Cranial nerves are grossly intact. PSYCH:  Oriented to person, place and time. Affect is normal.  Data Reviewed No images or labs to review for this visit I have personally reviewed the patient's imaging, laboratory findings and medical records.    Assessment    Left inguinal and umbilical hernias    Plan    82 year old male with a left inguinal and umbilical hernia. Both are easily reducible and nontender. Had long conversation with the patient about the natural history of hernias as well as the risks of incarceration and strangulation of either hernia. Discussed the indication for surgical repair in the elective setting. Despite this long conversation patient continued to voice that since neither area causes him any discomfort that he is not interested in surgical intervention currently. Although his daughter seemed not pleased with this conversation the patient remained adamant. Reiterated multiple times the signs and symptoms of incarceration or strangulation and to report to the emergency department immediately should they occur. He voiced understanding. He'll follow-up in clinic on an as-needed basis if he changes mind about elective surgical repair.     Time spent with the patient was 45 minutes, with more than 50% of the time spent in face-to-face education, counseling and care coordination.     Clayburn Pert, MD FACS General Surgeon 06/22/2016, 9:45 AM

## 2016-06-22 NOTE — Patient Instructions (Signed)

## 2016-11-30 ENCOUNTER — Other Ambulatory Visit: Payer: Self-pay | Admitting: Urology

## 2016-11-30 DIAGNOSIS — N401 Enlarged prostate with lower urinary tract symptoms: Secondary | ICD-10-CM

## 2016-12-13 ENCOUNTER — Emergency Department
Admission: EM | Admit: 2016-12-13 | Discharge: 2016-12-13 | Disposition: A | Discharge status: Home / Self Care | Payer: Medicare HMO | Attending: Student in an Organized Health Care Education/Training Program | Admitting: Student in an Organized Health Care Education/Training Program

## 2016-12-13 ENCOUNTER — Emergency Department: Payer: Medicare HMO

## 2016-12-13 DIAGNOSIS — R059 Cough, unspecified: Secondary | ICD-10-CM

## 2016-12-13 DIAGNOSIS — Z7982 Long term (current) use of aspirin: Secondary | ICD-10-CM | POA: Insufficient documentation

## 2016-12-13 DIAGNOSIS — J40 Bronchitis, not specified as acute or chronic: Secondary | ICD-10-CM | POA: Insufficient documentation

## 2016-12-13 DIAGNOSIS — N3000 Acute cystitis without hematuria: Secondary | ICD-10-CM | POA: Insufficient documentation

## 2016-12-13 DIAGNOSIS — Z87891 Personal history of nicotine dependence: Secondary | ICD-10-CM | POA: Insufficient documentation

## 2016-12-13 DIAGNOSIS — R05 Cough: Secondary | ICD-10-CM

## 2016-12-13 DIAGNOSIS — R062 Wheezing: Secondary | ICD-10-CM | POA: Diagnosis present

## 2016-12-13 LAB — URINALYSIS, ROUTINE W REFLEX MICROSCOPIC
Bilirubin Urine: NEGATIVE
GLUCOSE, UA: NEGATIVE mg/dL
Hgb urine dipstick: NEGATIVE
KETONES UR: 5 mg/dL — AB
Nitrite: NEGATIVE
PH: 5 (ref 5.0–8.0)
Protein, ur: 30 mg/dL — AB
SPECIFIC GRAVITY, URINE: 1.032 — AB (ref 1.005–1.030)
SQUAMOUS EPITHELIAL / LPF: NONE SEEN

## 2016-12-13 LAB — BLOOD GAS, VENOUS
Acid-Base Excess: 3.1 mmol/L — ABNORMAL HIGH (ref 0.0–2.0)
BICARBONATE: 29.5 mmol/L — AB (ref 20.0–28.0)
O2 SAT: 79.2 %
PCO2 VEN: 51 mmHg (ref 44.0–60.0)
PH VEN: 7.37 (ref 7.250–7.430)
Patient temperature: 37
pO2, Ven: 45 mmHg (ref 32.0–45.0)

## 2016-12-13 LAB — COMPREHENSIVE METABOLIC PANEL
ALT: 16 U/L — ABNORMAL LOW (ref 17–63)
AST: 24 U/L (ref 15–41)
Albumin: 3.8 g/dL (ref 3.5–5.0)
Alkaline Phosphatase: 79 U/L (ref 38–126)
Anion gap: 7 (ref 5–15)
BILIRUBIN TOTAL: 0.6 mg/dL (ref 0.3–1.2)
BUN: 18 mg/dL (ref 6–20)
CHLORIDE: 104 mmol/L (ref 101–111)
CO2: 25 mmol/L (ref 22–32)
Calcium: 8.7 mg/dL — ABNORMAL LOW (ref 8.9–10.3)
Creatinine, Ser: 1.17 mg/dL (ref 0.61–1.24)
GFR, EST NON AFRICAN AMERICAN: 57 mL/min — AB (ref >60)
Glucose, Bld: 106 mg/dL — ABNORMAL HIGH (ref 65–99)
POTASSIUM: 3.8 mmol/L (ref 3.5–5.1)
Sodium: 136 mmol/L (ref 135–145)
TOTAL PROTEIN: 7.4 g/dL (ref 6.5–8.1)

## 2016-12-13 LAB — CBC
HEMATOCRIT: 41.4 % (ref 40.0–52.0)
Hemoglobin: 14 g/dL (ref 13.0–18.0)
MCH: 32 pg (ref 26.0–34.0)
MCHC: 33.9 g/dL (ref 32.0–36.0)
MCV: 94.5 fL (ref 80.0–100.0)
PLATELETS: 168 10*3/uL (ref 150–440)
RBC: 4.39 MIL/uL — ABNORMAL LOW (ref 4.40–5.90)
RDW: 13.7 % (ref 11.5–14.5)
WBC: 7.6 10*3/uL (ref 3.8–10.6)

## 2016-12-13 LAB — MAGNESIUM: Magnesium: 2 mg/dL (ref 1.7–2.4)

## 2016-12-13 LAB — TROPONIN I

## 2016-12-13 MED ORDER — CEPHALEXIN 500 MG PO CAPS: 500.0000 mg | ORAL_CAPSULE | Freq: Three times a day (TID) | ORAL | 0 refills | Status: AC

## 2016-12-13 MED ORDER — METHYLPREDNISOLONE SODIUM SUCC 125 MG IJ SOLR
125.0000 mg | Freq: Every day | INTRAMUSCULAR | Status: DC
  Administered 2016-12-13: 125 mg via INTRAVENOUS
  Filled 2016-12-13: qty 2

## 2016-12-13 MED ORDER — ALBUTEROL SULFATE HFA 108 (90 BASE) MCG/ACT IN AERS: 2.0000 | INHALATION_SPRAY | Freq: Four times a day (QID) | RESPIRATORY_TRACT | 2 refills | Status: DC | PRN

## 2016-12-13 MED ORDER — AEROCHAMBER MV MISC: 0 refills | Status: DC

## 2016-12-13 MED ORDER — CEPHALEXIN 500 MG PO CAPS
500.0000 mg | ORAL_CAPSULE | Freq: Once | ORAL | Status: AC
  Administered 2016-12-13: 500 mg via ORAL
  Filled 2016-12-13: qty 1

## 2016-12-13 MED ORDER — PREDNISONE 20 MG PO TABS: 40.0000 mg | ORAL_TABLET | Freq: Every day | ORAL | 0 refills | Status: AC

## 2016-12-13 MED ORDER — LEVOFLOXACIN 500 MG PO TABS
500.0000 mg | ORAL_TABLET | Freq: Once | ORAL | Status: AC
  Administered 2016-12-13: 500 mg via ORAL
  Filled 2016-12-13: qty 1

## 2016-12-13 MED ORDER — IPRATROPIUM-ALBUTEROL 0.5-2.5 (3) MG/3ML IN SOLN
3.0000 mL | Freq: Once | RESPIRATORY_TRACT | Status: AC
  Administered 2016-12-13: 3 mL via RESPIRATORY_TRACT
  Filled 2016-12-13: qty 3

## 2016-12-13 MED ORDER — LEVOFLOXACIN 250 MG PO TABS: 250.0000 mg | ORAL_TABLET | Freq: Every day | ORAL | 0 refills | Status: AC

## 2016-12-13 MED ORDER — ALBUTEROL SULFATE (2.5 MG/3ML) 0.083% IN NEBU
5.0000 mg | INHALATION_SOLUTION | Freq: Once | RESPIRATORY_TRACT | Status: AC
  Administered 2016-12-13: 5 mg via RESPIRATORY_TRACT
  Filled 2016-12-13: qty 6

## 2016-12-13 NOTE — ED Notes (Signed)
Pt's daughter updated on care plan. Pt's daughter verbalizes understanding.

## 2016-12-13 NOTE — ED Notes (Signed)
Report to kim, rn.  

## 2016-12-13 NOTE — ED Notes (Signed)
Pt and family updated regarding treatment plan and wait for md.

## 2016-12-13 NOTE — ED Notes (Signed)
Ed tech in to ambulate pt with cont pox.

## 2016-12-13 NOTE — ED Provider Notes (Signed)
[facility] Emergency Department Provider Note    First MD Initiated Contact with Patient 12/13/16 [phone]     (approximate)  I have reviewed the triage vital signs and the nursing notes.   HISTORY  Chief Complaint Wheezing    HPI Chris Simon is a 83 y.o. male  with history of A. fib and CAD presents with several days of cough congestion and worsening wheezing. Patient also describes generalized fatigue. No known history of COPD but the patient*states she "thinks she has been told that he has COPD by his cardiologist ". Patient denies any chest pain. Has been having a nonproductive cough. No measured fevers. No nausea or vomiting. Daughter states that he has been more confused than usual for the past several months and has been sleeping more. They do suspect there is some undiagnosed dementia but patient is currently A&O times 3.    Past Medical History:  Diagnosis Date  . Aneurysm ([place])   . Atrial fibrillation ([place])   . Blockage of a bile duct   . BPH with obstruction/lower urinary tract symptoms   . Cellulitis of scrotum   . Elevated PSA   . Epididymitis   . Gallstone   . Gross hematuria   . Kidney stones   . Nocturia   . Testicular pain   . Urinary retention    Family History  Problem Relation Age of Onset  . Prostate cancer Brother   . Kidney disease Neg Hx   . Bladder Cancer Neg Hx    Past Surgical History:  Procedure Laterality Date  . CHOLECYSTECTOMY N/A 07/11/2015   Procedure: LAPAROSCOPIC CHOLECYSTECTOMY;  Surgeon: Rolm Bookbinder, MD;  Location: [facility];  Service: General;  Laterality: N/A;  . ERCP N/A 07/09/2015   Procedure: ENDOSCOPIC RETROGRADE CHOLANGIOPANCREATOGRAPHY (ERCP);  Surgeon: Clarene Essex, MD;  Location: [facility] ENDOSCOPY;  Service: Endoscopy;  Laterality: N/A;   Patient Active Problem List   Diagnosis Date Noted  . Left inguinal hernia 06/22/2016  . Umbilical hernia without obstruction and without gangrene 06/22/2016    . Calculus of kidney 09/03/2015  . Testicular cyst 09/03/2015  . Sepsis secondary to UTI ([place]) 07/20/2015  . BPH with obstruction/lower urinary tract symptoms 07/18/2015  . Elevated PSA 07/18/2015  . Sepsis ([place]) 07/09/2015  . Acute kidney injury ([place])   . Paroxysmal atrial fibrillation ([facility])   . UTI (lower urinary tract infection)   . Atrial fibrillation ([place]) 10/01/2014  . Pure hypercholesterolemia 10/01/2014  . Benign fibroma of prostate 10/06/2012  . Bladder retention 10/06/2012      Prior to Admission medications   Medication Sig Start Date End Date Taking? Authorizing Provider  amiodarone (PACERONE) 200 MG tablet Take 200 mg by mouth daily.   Yes Historical Provider, MD  aspirin EC 325 MG tablet Take 325 mg by mouth daily.   Yes Historical Provider, MD  finasteride (PROSCAR) 5 MG tablet Take 1 tablet (5 mg total) by mouth daily. 10/24/15  Yes Hollice Espy, MD  Multiple Vitamin (MULTIVITAMIN WITH MINERALS) TABS tablet Take 1 tablet by mouth daily.   Yes Historical Provider, MD  rosuvastatin (CRESTOR) 20 MG tablet  10/10/15  Yes Historical Provider, MD  tamsulosin (FLOMAX) 0.4 MG CAPS capsule TAKE 1 CAPSULE BY MOUTH DAILY 11/30/16  Yes Hollice Espy, MD  vitamin B-12 (CYANOCOBALAMIN) 500 MCG tablet Take 500 mcg by mouth daily.   Yes Historical Provider, MD    Allergies Clindamycin hcl; Clindamycin/lincomycin; and Sulfamethoxazole-trimethoprim    Social History Social  History  Substance Use Topics  . Smoking status: Former Research scientist (life sciences)  . Smokeless tobacco: Former Systems developer     Comment: quit 40 years  . Alcohol use No    Review of Systems Patient denies headaches, rhinorrhea, blurry vision, numbness, shortness of breath, chest pain, edema, cough, abdominal pain, nausea, vomiting, diarrhea, dysuria, fevers, rashes or hallucinations unless otherwise stated above in HPI. ____________________________________________   PHYSICAL EXAM:  VITAL SIGNS: Vitals:   12/13/16 0209  BP:  129/70  Pulse: 77  Resp: 20  Temp: 98.4 F (36.9 C)    Constitutional: Alert and oriented. Mildly tachypneic but in no acute distress Eyes: Conjunctivae are normal. PERRL. EOMI. Head: Atraumatic. Nose: No congestion/rhinnorhea. Mouth/Throat: Mucous membranes are moist.  Oropharynx non-erythematous. Neck: No stridor. Painless ROM. No cervical spine tenderness to palpation Hematological/Lymphatic/Immunilogical: No cervical lymphadenopathy. Cardiovascular: Normal rate, regular rhythm. Grossly normal heart sounds.  Good peripheral circulation. Respiratory: tachypnic with use of accessory muscles.  Prolonged expiratory phase with diffuse qheezing Gastrointestinal: Soft and nontender. No distention. No abdominal bruits. No CVA tenderness. Genitourinary:  Musculoskeletal: No lower extremity tenderness nor edema.  No joint effusions. Neurologic:  Normal speech and language. No gross focal neurologic deficits are appreciated. No gait instability. Skin:  Skin is warm, dry and intact. No rash noted.  ____________________________________________   LABS (all labs ordered are listed, but only abnormal results are displayed)  Results for orders placed or performed during the hospital encounter of 12/13/16 (from the past 24 hour(s))  CBC     Status: Abnormal   Collection Time: 12/13/16  2:17 AM  Result Value Ref Range   WBC 7.6 3.8 - 10.6 K/uL   RBC 4.39 (L) 4.40 - 5.90 MIL/uL   Hemoglobin 14.0 13.0 - 18.0 g/dL   HCT 41.4 40.0 - 52.0 %   MCV 94.5 80.0 - 100.0 fL   MCH 32.0 26.0 - 34.0 pg   MCHC 33.9 32.0 - 36.0 g/dL   RDW 13.7 11.5 - 14.5 %   Platelets 168 150 - 440 K/uL  Comprehensive metabolic panel     Status: Abnormal   Collection Time: 12/13/16  2:17 AM  Result Value Ref Range   Sodium 136 135 - 145 mmol/L   Potassium 3.8 3.5 - 5.1 mmol/L   Chloride 104 101 - 111 mmol/L   CO2 25 22 - 32 mmol/L   Glucose, Bld 106 (H) 65 - 99 mg/dL   BUN 18 6 - 20 mg/dL   Creatinine, Ser 1.17 0.61  - 1.24 mg/dL   Calcium 8.7 (L) 8.9 - 10.3 mg/dL   Total Protein 7.4 6.5 - 8.1 g/dL   Albumin 3.8 3.5 - 5.0 g/dL   AST 24 15 - 41 U/L   ALT 16 (L) 17 - 63 U/L   Alkaline Phosphatase 79 38 - 126 U/L   Total Bilirubin 0.6 0.3 - 1.2 mg/dL   GFR calc non Af Amer 57 (L) >60 mL/min   GFR calc Af Amer >60 >60 mL/min   Anion gap 7 5 - 15  Troponin I     Status: None   Collection Time: 12/13/16  2:17 AM  Result Value Ref Range   Troponin I <0.03 <0.03 ng/mL   ____________________________________________  EKG My review and personal interpretation at Time: 2:18   Indication: sob  Rate: 70  Rhythm: sinus  Axis: normal Other: no st elevtion or depression, normal intervals ____________________________________________  RADIOLOGY  I personally reviewed all radiographic images ordered to evaluate for the  above acute complaints and reviewed radiology reports and findings.  These findings were personally discussed with the patient.  Please see medical record for radiology report. ____________________________________________   PROCEDURES  Procedure(s) performed:  Procedures    Critical Care performed: no ____________________________________________   INITIAL IMPRESSION / ASSESSMENT AND PLAN / ED COURSE  Pertinent labs & imaging results that were available during my care of the patient were reviewed by me and considered in my medical decision making (see chart for details).  DDX: Asthma, copd, CHF, pna, ptx, malignancy, Pe, anemia   Amun Devondre Guzzetta is a 83 y.o. who presents to the ED with chief complaint of cough congestion and wheezing for the past several nights causing him to have difficulty sleeping. He arrives afebrile hemodynamically stable. He has no evidence of significant respiratory distress.  Presentation is concerning for some chronic underlying COPD and based on his wheezing. Chest x-ray ordered to evaluate for any pneumonia shows none. Patient given nebulizers as well as  steroids. His magnesium is normal. No evidence of ACS. His abdominal exam is soft and benign. Daughter states that she is also concern for possible UTI. We'll check urine.  Clinical Course as of Dec 14 746  Mon Dec 13, 2016  0726 Patient again ambulated without any hypoxia. His wheezing has significantly improved after nebulizer treatment.Patient to sleep his oxygen level does not go below 90%. He is not tachycardic even after having multiple nebulizer treatments and does not have any evidence of hypotension.  I did recommend admission to the hospital for further evaluation and management of his bronchitis as well as evidence of UTI, however the patient is refusing admission. He states that he wants to go home and sleep in his own bed. He did demonstrate understanding that his condition could worsen but states they prefer to be at home and states that he would prefer to take antibiotics, breathing treatments and steroids at home and demonstrates a good understanding of follow-up with his primary care physician and understands signs and symptoms for which he should return to the ER.  Discussed case with daughter at bedside who feels comfortable with this plan.  I have recommended patient follow-up with his primary physician tomorrow for recheck or return to the ER if unable to get in the clinic for recheck. Have discussed with the patient and available family all diagnostics and treatments performed thus far and all questions were answered to the best of my ability. The patient demonstrates understanding and agreement with plan.   [PR]    Clinical Course User Index [PR] Merlyn Lot, MD     ____________________________________________   FINAL CLINICAL IMPRESSION(S) / ED DIAGNOSES  Final diagnoses:  Bronchitis  Cough  Acute cystitis without hematuria      NEW MEDICATIONS STARTED DURING THIS VISIT:  New Prescriptions   No medications on file     Note:  This document was prepared  using Dragon voice recognition software and may include unintentional dictation errors.    Merlyn Lot, MD 12/13/16 [phone]

## 2016-12-13 NOTE — ED Triage Notes (Signed)
Pt's daughter reports pt with cough, congestion, wheezing, unable to sleep tonight, pt taking otc cough medicine and zyrtec without relief, pt saw his primary care dr on Friday and wasn't prescribed anything. Pt coughing non-productive in triage, daughter also states occasionally he will have similar s/s with a uti

## 2016-12-13 NOTE — ED Notes (Signed)
Walked pt up the hall hooked up to Dinam[employer]. 02 STAT constant at 93/94.AS

## 2017-01-04 ENCOUNTER — Other Ambulatory Visit: Payer: Self-pay

## 2017-01-04 DIAGNOSIS — N401 Enlarged prostate with lower urinary tract symptoms: Secondary | ICD-10-CM

## 2017-01-04 MED ORDER — FINASTERIDE 5 MG PO TABS: 5.0000 mg | ORAL_TABLET | Freq: Every day | ORAL | 11 refills | Status: DC

## 2017-01-10 ENCOUNTER — Other Ambulatory Visit: Payer: Self-pay | Admitting: Urology

## 2017-01-10 DIAGNOSIS — N401 Enlarged prostate with lower urinary tract symptoms: Secondary | ICD-10-CM

## 2017-02-18 ENCOUNTER — Inpatient Hospital Stay
Admission: EM | Admit: 2017-02-18 | Discharge: 2017-02-21 | DRG: 069 | Disposition: A | Discharge status: Home / Self Care | Payer: Medicare HMO | Attending: Internal Medicine | Admitting: Internal Medicine

## 2017-02-18 ENCOUNTER — Other Ambulatory Visit: Payer: Self-pay

## 2017-02-18 ENCOUNTER — Emergency Department: Payer: Medicare HMO

## 2017-02-18 DIAGNOSIS — Z87891 Personal history of nicotine dependence: Secondary | ICD-10-CM

## 2017-02-18 DIAGNOSIS — Z881 Allergy status to other antibiotic agents status: Secondary | ICD-10-CM

## 2017-02-18 DIAGNOSIS — N401 Enlarged prostate with lower urinary tract symptoms: Secondary | ICD-10-CM | POA: Diagnosis present

## 2017-02-18 DIAGNOSIS — G459 Transient cerebral ischemic attack, unspecified: Secondary | ICD-10-CM | POA: Diagnosis not present

## 2017-02-18 DIAGNOSIS — J189 Pneumonia, unspecified organism: Secondary | ICD-10-CM | POA: Diagnosis present

## 2017-02-18 DIAGNOSIS — N400 Enlarged prostate without lower urinary tract symptoms: Secondary | ICD-10-CM | POA: Diagnosis present

## 2017-02-18 DIAGNOSIS — R079 Chest pain, unspecified: Secondary | ICD-10-CM

## 2017-02-18 DIAGNOSIS — Z7982 Long term (current) use of aspirin: Secondary | ICD-10-CM

## 2017-02-18 DIAGNOSIS — Z79899 Other long term (current) drug therapy: Secondary | ICD-10-CM

## 2017-02-18 DIAGNOSIS — I480 Paroxysmal atrial fibrillation: Secondary | ICD-10-CM | POA: Diagnosis present

## 2017-02-18 DIAGNOSIS — Z882 Allergy status to sulfonamides status: Secondary | ICD-10-CM

## 2017-02-18 LAB — CBC
HCT: 40.5 % (ref 40.0–52.0)
HEMOGLOBIN: 13.5 g/dL (ref 13.0–18.0)
MCH: 31.3 pg (ref 26.0–34.0)
MCHC: 33.3 g/dL (ref 32.0–36.0)
MCV: 94.1 fL (ref 80.0–100.0)
PLATELETS: 225 10*3/uL (ref 150–440)
RBC: 4.3 MIL/uL — AB (ref 4.40–5.90)
RDW: 13.3 % (ref 11.5–14.5)
WBC: 12.6 10*3/uL — AB (ref 3.8–10.6)

## 2017-02-18 LAB — COMPREHENSIVE METABOLIC PANEL
ALK PHOS: 73 U/L (ref 38–126)
ALT: 12 U/L — AB (ref 17–63)
AST: 19 U/L (ref 15–41)
Albumin: 3.7 g/dL (ref 3.5–5.0)
Anion gap: 8 (ref 5–15)
BUN: 16 mg/dL (ref 6–20)
CALCIUM: 8.7 mg/dL — AB (ref 8.9–10.3)
CHLORIDE: 103 mmol/L (ref 101–111)
CO2: 27 mmol/L (ref 22–32)
CREATININE: 1.08 mg/dL (ref 0.61–1.24)
Glucose, Bld: 136 mg/dL — ABNORMAL HIGH (ref 65–99)
Potassium: 3.6 mmol/L (ref 3.5–5.1)
SODIUM: 138 mmol/L (ref 135–145)
Total Bilirubin: 0.8 mg/dL (ref 0.3–1.2)
Total Protein: 7.4 g/dL (ref 6.5–8.1)

## 2017-02-18 LAB — TROPONIN I

## 2017-02-18 MED ORDER — FINASTERIDE 5 MG PO TABS
5.0000 mg | ORAL_TABLET | Freq: Every day | ORAL | Status: DC
  Administered 2017-02-19 – 2017-02-21 (×3): 5 mg via ORAL
  Filled 2017-02-18 (×3): qty 1

## 2017-02-18 MED ORDER — ACETAMINOPHEN 160 MG/5ML PO SOLN
650.0000 mg | ORAL | Status: DC | PRN
Start: 2017-02-18 — End: 2017-02-21

## 2017-02-18 MED ORDER — ATORVASTATIN CALCIUM 20 MG PO TABS
40.0000 mg | ORAL_TABLET | Freq: Every day | ORAL | Status: DC
  Administered 2017-02-19 – 2017-02-20 (×3): 40 mg via ORAL
  Filled 2017-02-18 (×3): qty 2

## 2017-02-18 MED ORDER — TAMSULOSIN HCL 0.4 MG PO CAPS
0.4000 mg | ORAL_CAPSULE | Freq: Every day | ORAL | Status: DC
  Administered 2017-02-19 – 2017-02-21 (×3): 0.4 mg via ORAL
  Filled 2017-02-18 (×3): qty 1

## 2017-02-18 MED ORDER — STROKE: EARLY STAGES OF RECOVERY BOOK
Freq: Once | Status: AC
  Administered 2017-02-19: 01:00:00

## 2017-02-18 MED ORDER — ACETAMINOPHEN 325 MG PO TABS
650.0000 mg | ORAL_TABLET | ORAL | Status: DC | PRN
  Administered 2017-02-19 (×2): 650 mg via ORAL
  Filled 2017-02-18 (×2): qty 2

## 2017-02-18 MED ORDER — ENOXAPARIN SODIUM 40 MG/0.4ML ~~LOC~~ SOLN
40.0000 mg | SUBCUTANEOUS | Status: DC
  Administered 2017-02-19: 40 mg via SUBCUTANEOUS
  Filled 2017-02-18: qty 0.4

## 2017-02-18 MED ORDER — AMIODARONE HCL 200 MG PO TABS
200.0000 mg | ORAL_TABLET | Freq: Every day | ORAL | Status: DC
  Administered 2017-02-19 – 2017-02-21 (×3): 200 mg via ORAL
  Filled 2017-02-18 (×3): qty 1

## 2017-02-18 MED ORDER — ASPIRIN EC 325 MG PO TBEC
325.0000 mg | DELAYED_RELEASE_TABLET | Freq: Every day | ORAL | Status: DC
  Administered 2017-02-19 – 2017-02-21 (×3): 325 mg via ORAL
  Filled 2017-02-18 (×3): qty 1

## 2017-02-18 MED ORDER — ACETAMINOPHEN 650 MG RE SUPP: 650.0000 mg | RECTAL | Status: DC | PRN

## 2017-02-18 MED ORDER — ASPIRIN 81 MG PO CHEW
324.0000 mg | CHEWABLE_TABLET | Freq: Once | ORAL | Status: AC
  Administered 2017-02-18: 324 mg via ORAL
  Filled 2017-02-18: qty 4

## 2017-02-18 NOTE — ED Provider Notes (Signed)
[facility] Emergency Department Provider Note       Time seen: ----------------------------------------- 8:40 PM on 02/18/2017 -----------------------------------------     I have reviewed the triage vital signs and the nursing notes.   HISTORY   Chief Complaint Aphasia and Groin Pain    HPI Chris Simon is a 83 y.o. male who presents to the ED for slurred speech lasted for approximately 5 minutes. The daughter reports she returned home from work and noticed his speech was garbled and slower . Daughter reports she had been at work for approximately 8 hours and that this morning was the last time she had seen and normal.  Daughter also states he was putting meaningless words together and could not figure out what to say. This is never happened to him before   Past Medical History:  Diagnosis Date  . Aneurysm ([place])   . Atrial fibrillation ([place])   . Blockage of a bile duct   . BPH with obstruction/lower urinary tract symptoms   . Cellulitis of scrotum   . Elevated PSA   . Epididymitis   . Gallstone   . Gross hematuria   . Kidney stones   . Nocturia   . Testicular pain   . Urinary retention     Patient Active Problem List   Diagnosis Date Noted  . Left inguinal hernia 06/22/2016  . Umbilical hernia without obstruction and without gangrene 06/22/2016  . Calculus of kidney 09/03/2015  . Testicular cyst 09/03/2015  . Sepsis secondary to UTI ([place]) 07/20/2015  . BPH with obstruction/lower urinary tract symptoms 07/18/2015  . Elevated PSA 07/18/2015  . Sepsis ([place]) 07/09/2015  . Acute kidney injury ([place])   . Paroxysmal atrial fibrillation ([facility])   . UTI (lower urinary tract infection)   . Atrial fibrillation ([place]) 10/01/2014  . Pure hypercholesterolemia 10/01/2014  . Benign fibroma of prostate 10/06/2012  . Bladder retention 10/06/2012    Past Surgical History:  Procedure Laterality Date  . CHOLECYSTECTOMY N/A 07/11/2015   Procedure:  LAPAROSCOPIC CHOLECYSTECTOMY;  Surgeon: Rolm Bookbinder, MD;  Location: [place];  Service: General;  Laterality: N/A;  . ERCP N/A 07/09/2015   Procedure: ENDOSCOPIC RETROGRADE CHOLANGIOPANCREATOGRAPHY (ERCP);  Surgeon: Clarene Essex, MD;  Location: [facility] ENDOSCOPY;  Service: Endoscopy;  Laterality: N/A;    Allergies Clindamycin hcl; Clindamycin/lincomycin; and Sulfamethoxazole-trimethoprim  Social History Social History  Substance Use Topics  . Smoking status: Former Research scientist (life sciences)  . Smokeless tobacco: Former Systems developer     Comment: quit 40 years  . Alcohol use No    Review of Systems Constitutional: Negative for fever. Eyes: Negative for vision changes ENT:  Negative for congestion, sore throat Cardiovascular: Negative for chest pain. Respiratory: Negative for shortness of breath. Gastrointestinal: Negative for abdominal pain, vomiting and diarrhea. Genitourinary: Negative for dysuria. Musculoskeletal: Negative for back pain. Skin: Negative for rash. Neurological: Positive for speech disturbance.  All systems negative/normal/unremarkable except as stated in the HPI  ____________________________________________   PHYSICAL EXAM:  VITAL SIGNS: ED Triage Vitals  Enc Vitals Group     BP 02/18/17 2000 (!) 137/98     Pulse Rate 02/18/17 2000 (!) 105     Resp 02/18/17 2000 20     Temp 02/18/17 2000 98 F (36.7 C)     Temp Source 02/18/17 2000 Oral     SpO2 02/18/17 2000 97 %     Weight 02/18/17 1927 192 lb (87.1 kg)     Height 02/18/17 1927 6' (1.829 m)  Head Circumference --      Peak Flow --      Pain Score --      Pain Loc --      Pain Edu? --      Excl. in [place]? --     Constitutional: Alert and oriented. Well appearing and in no distress. Eyes: Conjunctivae are normal. Normal extraocular movements. ENT   Head: Normocephalic and atraumatic.   Nose: No congestion/rhinnorhea.   Mouth/Throat: Mucous membranes are moist.   Neck: No stridor. Cardiovascular: Normal  rate, regular rhythm. No murmurs, rubs, or gallops. Respiratory: Normal respiratory effort without tachypnea nor retractions. Breath sounds are clear and equal bilaterally. No wheezes/rales/rhonchi. Gastrointestinal: Soft and nontender. Normal bowel sounds Musculoskeletal: Nontender with normal range of motion in extremities. No lower extremity tenderness nor edema. Neurologic:  Normal speech and language. No gross focal neurologic deficits are appreciated.  Skin:  Skin is warm, dry and intact. No rash noted. Psychiatric: Mood and affect are normal. Speech and behavior are normal.  ____________________________________________  EKG: Interpreted by me. Sinus tachycardia with a rate of 101 bpm, normal PR interval, normal QRS, normal QT.  ____________________________________________  ED COURSE:  Pertinent labs & imaging results that were available during my care of the patient were reviewed by me and considered in my medical decision making (see chart for details). Patient presents for aphasia, we will assess with labs and imaging as indicated.   Procedures ____________________________________________   LABS (pertinent positives/negatives)  Labs Reviewed  CBC - Abnormal; Notable for the following:       Result Value   WBC 12.6 (*)    RBC 4.30 (*)    All other components within normal limits  COMPREHENSIVE METABOLIC PANEL - Abnormal; Notable for the following:    Glucose, Bld 136 (*)    Calcium 8.7 (*)    ALT 12 (*)    All other components within normal limits  TROPONIN I    RADIOLOGY Images were viewed by me  IMPRESSION: Atrophic changes without acute abnormality.  ____________________________________________  FINAL ASSESSMENT AND PLAN  TIA  Plan: Patient's labs and imaging were dictated above. Patient had presented for aphasia that has resolved. We have given him his scheduled dose of aspirin. He will need to be admitted and have a full TIA workup. I will discuss with  the hospitalist for admission.   Earleen Newport, MD   Note: This note was generated in part or whole with voice recognition software. Voice recognition is usually quite accurate but there are transcription errors that can and very often do occur. I apologize for any typographical errors that were not detected and corrected.     Earleen Newport, MD 02/18/17 2105

## 2017-02-18 NOTE — ED Notes (Signed)
Pt reports new onset CP right sided sharp pain with no radiation.  EKG obtained and given to EDP.

## 2017-02-18 NOTE — ED Triage Notes (Signed)
Daughter reports she returned home from work and noticed that patient's speech seemed garbled and slower than usual, lasting approximately 5 minutes.  Daughter reports that she had been at work for approximately 8 hours and that this morning was the last time she had seen him at his normal.  Also reports that patient has a left inguinal hernia and it is causing him more pain.  Patient is awake and alert, good equal hand grips no palmar drift.  Patient denies any problems with his speech or having any pain.

## 2017-02-18 NOTE — H&P (Signed)
[place] [facility] [place] NAME: Chris Simon    MR#:  [id number]  DATE OF BIRTH:  September 11, 1934  DATE OF ADMISSION:  02/18/2017  PRIMARY CARE PHYSICIAN: [name], MD   REQUESTING/REFERRING PHYSICIAN: Jimmye Norman, MD  CHIEF COMPLAINT:   Chief Complaint  Patient presents with  . Aphasia  . Groin Pain    HISTORY OF PRESENT ILLNESS:  Chris Simon  is a 83 y.o. male who presents with Transient 5 minute episode of word salad and expressive dysphasia.  Patient does have a history of paroxysmal A. fib. States that he began feeling "funny", and "bad" earlier today and then had difficulty "getting his words out." He says that he knew what he wanted to say, but couldn't make his mouth so it. Family members at bedside who witnessed the event and describes that he was saying words, but they made no sense. Symptoms resolved by the time he arrived at the ED. Given his risk for TIA/stroke, hospitalists were called for admission and further evaluation  PAST MEDICAL HISTORY:   Past Medical History:  Diagnosis Date  . Aneurysm ([place])   . Atrial fibrillation ([place])   . Blockage of a bile duct   . BPH with obstruction/lower urinary tract symptoms   . Cellulitis of scrotum   . Elevated PSA   . Epididymitis   . Gallstone   . Gross hematuria   . Kidney stones   . Nocturia   . Testicular pain   . Urinary retention     PAST SURGICAL HISTORY:   Past Surgical History:  Procedure Laterality Date  . CHOLECYSTECTOMY N/A 07/11/2015   Procedure: LAPAROSCOPIC CHOLECYSTECTOMY;  Surgeon: Rolm Bookbinder, MD;  Location: [place];  Service: General;  Laterality: N/A;  . ERCP N/A 07/09/2015   Procedure: ENDOSCOPIC RETROGRADE CHOLANGIOPANCREATOGRAPHY (ERCP);  Surgeon: Clarene Essex, MD;  Location: [facility] ENDOSCOPY;  Service: Endoscopy;  Laterality: N/A;    SOCIAL HISTORY:   Social History  Substance Use Topics  . Smoking status: Former Research scientist (life sciences)  . Smokeless tobacco:  Former Systems developer     Comment: quit 40 years  . Alcohol use No    FAMILY HISTORY:   Family History  Problem Relation Age of Onset  . Prostate cancer Brother   . Kidney disease Neg Hx   . Bladder Cancer Neg Hx     DRUG ALLERGIES:   Allergies  Allergen Reactions  . Clindamycin Hcl     Other reaction(s): Unknown Reaction:  Unknown   . Clindamycin/Lincomycin Other (See Comments)    Reaction:  Unknown   . Sulfamethoxazole-Trimethoprim Rash    MEDICATIONS AT HOME:   Prior to Admission medications   Medication Sig Start Date End Date Taking? Authorizing Provider  albuterol (PROVENTIL HFA;VENTOLIN HFA) 108 (90 Base) MCG/ACT inhaler Inhale 2 puffs into the lungs every 6 (six) hours as needed for wheezing or shortness of breath. 12/13/16   Merlyn Lot, MD  amiodarone (PACERONE) 200 MG tablet Take 200 mg by mouth daily.    [provider]  aspirin EC 325 MG tablet Take 325 mg by mouth daily.    [provider]  finasteride (PROSCAR) 5 MG tablet Take 1 tablet (5 mg total) by mouth daily. 01/04/17   Hollice Espy, MD  Multiple Vitamin (MULTIVITAMIN WITH MINERALS) TABS tablet Take 1 tablet by mouth daily.    [provider]  rosuvastatin (CRESTOR) 20 MG tablet  10/10/15   [provider]  Spacer/Aero-Holding Chambers (AEROCHAMBER  MV) inhaler Use as instructed 12/13/16   Merlyn Lot, MD  tamsulosin [facility]) 0.4 MG CAPS capsule TAKE 1 CAPSULE BY MOUTH DAILY 01/10/17   Zara Council A, PA-C  vitamin B-12 (CYANOCOBALAMIN) 500 MCG tablet Take 500 mcg by mouth daily.    [provider]    REVIEW OF SYSTEMS:  Review of Systems  Constitutional: Negative for chills, fever, malaise/fatigue and weight loss.  HENT: Negative for ear pain, hearing loss and tinnitus.   Eyes: Negative for blurred vision, double vision, pain and redness.  Respiratory: Negative for cough, hemoptysis and shortness of breath.   Cardiovascular: Negative for chest pain,  palpitations, orthopnea and leg swelling.  Gastrointestinal: Negative for abdominal pain, constipation, diarrhea, nausea and vomiting.  Genitourinary: Negative for dysuria, frequency and hematuria.  Musculoskeletal: Negative for back pain, joint pain and neck pain.  Skin:       No acne, rash, or lesions  Neurological: Positive for speech change. Negative for dizziness, tremors, focal weakness and weakness.  Endo/Heme/Allergies: Negative for polydipsia. Does not bruise/bleed easily.  Psychiatric/Behavioral: Negative for depression. The patient is not nervous/anxious and does not have insomnia.      VITAL SIGNS:   Vitals:   02/18/17 1927 02/18/17 2000  BP:  (!) 137/98  Pulse:  (!) 105  Resp:  20  Temp:  98 F (36.7 C)  TempSrc:  Oral  SpO2:  97%  Weight: 87.1 kg (192 lb)   Height: 6' (1.829 m)    Wt Readings from Last 3 Encounters:  02/18/17 87.1 kg (192 lb)  12/13/16 86.2 kg (190 lb)  06/22/16 86.2 kg (190 lb)    PHYSICAL EXAMINATION:  Physical Exam  Vitals reviewed. Constitutional: He is oriented to person, place, and time. He appears well-developed and well-nourished. No distress.  HENT:  Head: Normocephalic and atraumatic.  Mouth/Throat: Oropharynx is clear and moist.  Eyes: Conjunctivae and EOM are normal. Pupils are equal, round, and reactive to light. No scleral icterus.  Neck: Normal range of motion. Neck supple. No JVD present. No thyromegaly present.  Cardiovascular: Normal rate, regular rhythm and intact distal pulses.  Exam reveals no gallop and no friction rub.   No murmur heard. Respiratory: Effort normal and breath sounds normal. No respiratory distress. He has no wheezes. He has no rales.  GI: Soft. Bowel sounds are normal. He exhibits no distension. There is no tenderness.  Musculoskeletal: Normal range of motion. He exhibits no edema.  No arthritis, no gout  Lymphadenopathy:    He has no cervical adenopathy.  Neurological: He is alert and oriented to  person, place, and time. No cranial nerve deficit.  Neurologic: Cranial nerves II-XII intact, Sensation intact to light touch/pinprick, 5/5 strength in all extremities, no dysarthria, no aphasia, no dysphagia, memory intact, no pronator drift   Skin: Skin is warm and dry. No rash noted. No erythema.  Psychiatric: He has a normal mood and affect. His behavior is normal. Judgment and thought content normal.    LABORATORY PANEL:   CBC  Recent Labs Lab 02/18/17 1935  WBC 12.6*  HGB 13.5  HCT 40.5  PLT 225   ------------------------------------------------------------------------------------------------------------------  Chemistries   Recent Labs Lab 02/18/17 1935  NA 138  K 3.6  CL 103  CO2 27  GLUCOSE 136*  BUN 16  CREATININE 1.08  CALCIUM 8.7*  AST 19  ALT 12*  ALKPHOS 73  BILITOT 0.8   ------------------------------------------------------------------------------------------------------------------  Cardiac Enzymes  Recent Labs Lab 02/18/17 1935  TROPONINI <0.03   ------------------------------------------------------------------------------------------------------------------  RADIOLOGY:  Ct Head Wo Contrast  Result Date: 02/18/2017 CLINICAL DATA:  Slurred speech EXAM: CT HEAD WITHOUT CONTRAST TECHNIQUE: Contiguous axial images were obtained from the base of the skull through the vertex without intravenous contrast. COMPARISON:  06/21/2014 FINDINGS: Brain: Mild atrophic changes are noted. No findings to suggest acute hemorrhage, acute infarction or space-occupying mass lesion are noted. Vascular: No hyperdense vessel or unexpected calcification. Skull: Normal. Negative for fracture or focal lesion. Sinuses/Orbits: No acute finding. Other: None. IMPRESSION: Atrophic changes without acute abnormality. Electronically Signed   By: Inez Catalina M.D.   On: 02/18/2017 19:55    EKG:   Orders placed or performed during the hospital encounter of 02/18/17  . ED EKG   . ED EKG  . EKG 12-Lead  . EKG 12-Lead    IMPRESSION AND PLAN:  Principal Problem:   TIA (transient ischemic attack) - patient has high risk for potential stroke. Symptoms were quickly self resolving. We will admit him under TIA/stroke order set protocol with appropriate labs, imaging, and consults Active Problems:   Paroxysmal atrial fibrillation ([place]) - continue home rate controlling medications   BPH (benign prostatic hyperplasia) - continue home meds  All the records are reviewed and case discussed with ED provider. Management plans discussed with the patient and/or family.  DVT PROPHYLAXIS: SubQ lovenox  GI PROPHYLAXIS: None  ADMISSION STATUS: Observation  CODE STATUS: Full Code Status History    Date Active Date Inactive Code Status Order ID Comments User Context   07/08/2015  8:59 PM 07/14/2015  7:33 PM Full Code [id number]  Corey Harold, NP Inpatient      TOTAL TIME TAKING CARE OF THIS PATIENT: 40 minutes.   Cherisse Carrell [place] 02/18/2017, 9:21 PM  [facility]  Office  [phone]  CC: Primary care physician; [name], MD  Note:  This document was prepared using Dragon voice recognition software and may include unintentional dictation errors.

## 2017-02-18 NOTE — ED Notes (Signed)
Pt transport to 101

## 2017-02-18 NOTE — ED Provider Notes (Signed)
Repeat EKG interpreted by me, sinus rhythm with a rate of 88 bpm, normal PR interval, normal QRS, normal QT.   Earleen Newport, MD 02/18/17 2259

## 2017-02-18 NOTE — ED Notes (Signed)
Patient transported back from CT 

## 2017-02-19 ENCOUNTER — Observation Stay: Payer: Medicare HMO

## 2017-02-19 ENCOUNTER — Observation Stay
Admit: 2017-02-19 | Discharge: 2017-02-19 | Disposition: A | Discharge status: Home / Self Care | Payer: Medicare HMO | Attending: Internal Medicine | Admitting: Internal Medicine

## 2017-02-19 DIAGNOSIS — G451 Carotid artery syndrome (hemispheric): Secondary | ICD-10-CM | POA: Diagnosis not present

## 2017-02-19 LAB — URINALYSIS, ROUTINE W REFLEX MICROSCOPIC
Bilirubin Urine: NEGATIVE
GLUCOSE, UA: NEGATIVE mg/dL
HGB URINE DIPSTICK: NEGATIVE
Ketones, ur: 5 mg/dL — AB
Leukocytes, UA: NEGATIVE
NITRITE: NEGATIVE
PH: 6 (ref 5.0–8.0)
Protein, ur: 30 mg/dL — AB
SPECIFIC GRAVITY, URINE: 1.024 (ref 1.005–1.030)

## 2017-02-19 LAB — LIPID PANEL
CHOL/HDL RATIO: 2.9 ratio
Cholesterol: 116 mg/dL (ref 0–200)
HDL: 40 mg/dL — ABNORMAL LOW (ref >40)
LDL Cholesterol: 65 mg/dL (ref 0–99)
Triglycerides: 56 mg/dL (ref <150)
VLDL: 11 mg/dL (ref 0–40)

## 2017-02-19 MED ORDER — LEVOFLOXACIN IN D5W 750 MG/150ML IV SOLN
750.0000 mg | INTRAVENOUS | Status: DC
  Administered 2017-02-19 – 2017-02-20 (×2): 750 mg via INTRAVENOUS
  Filled 2017-02-19 (×3): qty 150

## 2017-02-19 MED ORDER — APIXABAN 5 MG PO TABS
5.0000 mg | ORAL_TABLET | Freq: Two times a day (BID) | ORAL | Status: DC
  Administered 2017-02-19 – 2017-02-21 (×5): 5 mg via ORAL
  Filled 2017-02-19 (×5): qty 1

## 2017-02-19 MED ORDER — POLYVINYL ALCOHOL 1.4 % OP SOLN
1.0000 [drp] | OPHTHALMIC | Status: DC | PRN
  Administered 2017-02-19 – 2017-02-21 (×2): 1 [drp] via OPHTHALMIC
  Filled 2017-02-19: qty 15

## 2017-02-19 MED ORDER — VITAMIN B-12 1000 MCG PO TABS
500.0000 ug | ORAL_TABLET | Freq: Every day | ORAL | Status: DC
  Administered 2017-02-19 – 2017-02-21 (×3): 500 ug via ORAL
  Filled 2017-02-19 (×3): qty 1

## 2017-02-19 MED ORDER — ADULT MULTIVITAMIN W/MINERALS CH
1.0000 | ORAL_TABLET | Freq: Every day | ORAL | Status: DC
  Administered 2017-02-19 – 2017-02-21 (×3): 1 via ORAL
  Filled 2017-02-19 (×3): qty 1

## 2017-02-19 MED ORDER — GUAIFENESIN 100 MG/5ML PO SOLN: 5.0000 mL | ORAL | Status: DC | PRN

## 2017-02-19 MED ORDER — LORAZEPAM 0.5 MG PO TABS
0.5000 mg | ORAL_TABLET | Freq: Once | ORAL | Status: AC
  Administered 2017-02-19: 0.5 mg via ORAL
  Filled 2017-02-19: qty 1

## 2017-02-19 NOTE — Progress Notes (Signed)
OT Cancellation Note  Patient Details Name: Chris Simon MRN: [id number] DOB: 17-Dec-1933   Cancelled Treatment:    Reason Eval/Treat Not Completed: Patient at procedure or test/ unavailable  Harrel Carina, MS, OTR/L 02/19/2017, 10:34 AM

## 2017-02-19 NOTE — Care Management Obs Status (Signed)
[facility] NOTIFICATION   Patient Details  Name: Chris Simon MRN: [id number] Date of Birth: March 12, 1934   Medicare Observation Status Notification Given:  Yes [place] letter)    Mardene Speak, RN 02/19/2017, 2:42 PM

## 2017-02-19 NOTE — Progress Notes (Signed)
Physical Therapy Evaluation Patient Details Name: Chris Simon MRN: [id number] DOB: [birth year] Today's Date: 02/19/2017   History of Present Illness  Patient is an 83 y.o. male admitted on 25 May after experiencing expressive dysphagia x5 minutes. Also found to have pneumonia. PMH includes paroxysmal atrial fibrillation.  Clinical Impression  Patient is a pleasant male admitted for above listed reasons. Previously living alone independently with daughter nearby. Patient demonstrates no deficits in sensation/strength upon assessment and is independent with bed mobility, transfers, and gait. Patient is deemed to be at baseline level of function and does not require f/u upon discharge when medically ready.    Follow Up Recommendations No PT follow up    Equipment Recommendations  None recommended by PT    Recommendations for Other Services       Precautions / Restrictions Precautions Precautions: Fall Restrictions Weight Bearing Restrictions: No      Mobility  Bed Mobility Overal bed mobility: Independent             General bed mobility comments: Patient performs bed mobility independently.  Transfers Overall transfer level: Independent Equipment used: None             General transfer comment: Patient performs sit to stand transfer independently.  Ambulation/Gait Ambulation/Gait assistance: Independent Ambulation Distance (Feet): 170 Feet Assistive device: None       General Gait Details: Patient ambulates 18' with SBA with non-antalgic patterning. No LOB/dizziness.  Stairs            Wheelchair Mobility    Modified Rankin (Stroke Patients Only)       Balance Overall balance assessment: Independent                                           Pertinent Vitals/Pain Pain Assessment: No/denies pain    Home Living Family/patient expects to be discharged to:: Private residence Living Arrangements: Alone Available Help at  Discharge: Family;Available PRN/intermittently Type of Home: House Home Access: Ramped entrance     Home Layout: One level Home Equipment: None      Prior Function Level of Independence: Independent               Hand Dominance   Dominant Hand: Right    Extremity/Trunk Assessment   Upper Extremity Assessment Upper Extremity Assessment: Overall WFL for tasks assessed    Lower Extremity Assessment Lower Extremity Assessment: Overall WFL for tasks assessed       Communication   Communication: No difficulties  Cognition Arousal/Alertness: Awake/alert Behavior During Therapy: WFL for tasks assessed/performed Overall Cognitive Status: Within Functional Limits for tasks assessed                                        General Comments      Exercises     Assessment/Plan    PT Assessment Patent does not need any further PT services  PT Problem List         PT Treatment Interventions      PT Goals (Current goals can be found in the Care Plan section)  Acute Rehab PT Goals Patient Stated Goal: "To go home" PT Goal Formulation: With patient Time For Goal Achievement: 03/05/17 Potential to Achieve Goals: Good    Frequency     Barriers  to discharge        Co-evaluation               AM-PAC PT "6 Clicks" Daily Activity  Outcome Measure Difficulty turning over in bed (including adjusting bedclothes, sheets and blankets)?: None Difficulty moving from lying on back to sitting on the side of the bed? : None Difficulty sitting down on and standing up from a chair with arms (e.g., wheelchair, bedside commode, etc,.)?: None Help needed moving to and from a bed to chair (including a wheelchair)?: None Help needed walking in hospital room?: None Help needed climbing 3-5 steps with a railing? : None 6 Click Score: 24    End of Session Equipment Utilized During Treatment: Gait belt Activity Tolerance: Patient tolerated treatment  well Patient left: in bed;with call bell/phone within reach;with bed alarm set;with family/visitor present   PT Visit Diagnosis: Other symptoms and signs involving the nervous system (R29.898)    Time: 2820-6015 PT Time Calculation (min) (ACUTE ONLY): 18 min   Charges:   PT Evaluation $PT Eval Low Complexity: 1 Procedure     PT G Codes:   PT G-Codes **NOT FOR INPATIENT CLASS** Functional Assessment Tool Used: AM-PAC 6 Clicks Basic Mobility;Clinical judgement Functional Limitation: Mobility: Walking and moving around Mobility: Walking and Moving Around Current Status (I1537): At least 1 percent but less than 20 percent impaired, limited or restricted Mobility: Walking and Moving Around Goal Status [phone]): At least 1 percent but less than 20 percent impaired, limited or restricted Mobility: Walking and Moving Around Discharge Status [phone]): At least 1 percent but less than 20 percent impaired, limited or restricted      Dorice Lamas, PT, DPT 02/19/2017, 1:10 PM

## 2017-02-19 NOTE — Plan of Care (Signed)
Problem: Safety: Goal: Ability to remain free from injury will improve Outcome: Progressing High fall risk. Family at bedside requests bed alarm off while they're in the room. Patient impulsive at times but family calls staff for assistance. Safe environment promoted in room. Patient and family educated on importance of patient safety.  Problem: Health Behavior/Discharge Planning: Goal: Ability to manage health-related needs will improve Outcome: Progressing Patient is from home with his daughter.   Problem: Pain Managment: Goal: General experience of comfort will improve Outcome: Progressing Patient c/o right chest/rib pain with deep breaths. Chest xray shows PNA. Blood cultures drawn & Abx started as ordered. Tylenol given PRN with noted relief. Patient and family updated on current plan of care by Dr. Bridgett Larsson.

## 2017-02-19 NOTE — Progress Notes (Signed)
OT Cancellation Note  Patient Details Name: Chris Simon MRN: [id number] DOB: Sep 15, 1934   Cancelled Treatment:    Reason Eval/Treat Not Completed: OT screened, no needs identified, will sign off  Harrel Carina, MS, OTR/L 02/19/2017, 11:59 AM

## 2017-02-19 NOTE — Progress Notes (Signed)
[place] [facility] [place] NAME: Erminio Nygard    MR#:  [id number]  DATE OF BIRTH:  April 10, 1934  SUBJECTIVE:  CHIEF COMPLAINT:   Chief Complaint  Patient presents with  . Aphasia  . Groin Pain   Right-sided chest pain while deep breath, cough several days. No dysphagia, slurred speech or weakness numbness. REVIEW OF SYSTEMS:  Review of Systems  Constitutional: Negative for chills, fever and malaise/fatigue.  HENT: Negative for congestion.   Eyes: Negative for blurred vision and double vision.  Respiratory: Positive for cough and sputum production. Negative for hemoptysis, shortness of breath, wheezing and stridor.   Cardiovascular: Positive for chest pain. Negative for palpitations and leg swelling.  Gastrointestinal: Negative for abdominal pain, blood in stool, diarrhea, melena, nausea and vomiting.  Genitourinary: Negative for dysuria and hematuria.  Musculoskeletal: Negative for back pain.  Skin: Negative for itching and rash.  Neurological: Negative for dizziness, focal weakness, loss of consciousness and weakness.  Psychiatric/Behavioral: Negative for depression. The patient is not nervous/anxious.     DRUG ALLERGIES:   Allergies  Allergen Reactions  . Clindamycin Hcl     Other reaction(s): Unknown Reaction:  Unknown   . Clindamycin/Lincomycin Other (See Comments)    Reaction:  Unknown   . Sulfamethoxazole-Trimethoprim Rash   VITALS:  Blood pressure (!) 148/63, pulse 71, temperature 98.6 F (37 C), temperature source Oral, resp. rate 20, height 6' (1.829 m), weight 192 lb (87.1 kg), SpO2 93 %. PHYSICAL EXAMINATION:  Physical Exam  Constitutional: He is oriented to person, place, and time and well-developed, well-nourished, and in no distress.  HENT:  Head: Normocephalic.  Mouth/Throat: Oropharynx is clear and moist.  Eyes: Conjunctivae and EOM are normal. No scleral icterus.  Neck: Normal range of motion. Neck supple. No JVD  present. No tracheal deviation present.  Cardiovascular: Normal rate, regular rhythm and normal heart sounds.  Exam reveals no gallop.   No murmur heard. Pulmonary/Chest: Effort normal and breath sounds normal. No respiratory distress. He has no wheezes. He has no rales.  Abdominal: Soft. Bowel sounds are normal. He exhibits no distension. There is no tenderness.  Musculoskeletal: Normal range of motion. He exhibits no edema or tenderness.  Neurological: He is alert and oriented to person, place, and time. No cranial nerve deficit.  Skin: No rash noted. No erythema.  Psychiatric: Affect normal.   LABORATORY PANEL:  Male CBC  Recent Labs Lab 02/18/17 1935  WBC 12.6*  HGB 13.5  HCT 40.5  PLT 225   ------------------------------------------------------------------------------------------------------------------ Chemistries   Recent Labs Lab 02/18/17 1935  NA 138  K 3.6  CL 103  CO2 27  GLUCOSE 136*  BUN 16  CREATININE 1.08  CALCIUM 8.7*  AST 19  ALT 12*  ALKPHOS 73  BILITOT 0.8   RADIOLOGY:  Ct Head Wo Contrast  Result Date: 02/18/2017 CLINICAL DATA:  Slurred speech EXAM: CT HEAD WITHOUT CONTRAST TECHNIQUE: Contiguous axial images were obtained from the base of the skull through the vertex without intravenous contrast. COMPARISON:  06/21/2014 FINDINGS: Brain: Mild atrophic changes are noted. No findings to suggest acute hemorrhage, acute infarction or space-occupying mass lesion are noted. Vascular: No hyperdense vessel or unexpected calcification. Skull: Normal. Negative for fracture or focal lesion. Sinuses/Orbits: No acute finding. Other: None. IMPRESSION: Atrophic changes without acute abnormality. Electronically Signed   By: Inez Catalina M.D.   On: 02/18/2017 19:55   US Carotid Bilateral (at [facility] Only)  Result Date:  02/19/2017 CLINICAL DATA:  83 year old male with a history of TIA Cardiovascular risk factors include known prior stroke/ TIA. EXAM: BILATERAL  CAROTID DUPLEX ULTRASOUND TECHNIQUE: Pearline Cables scale imaging, color Doppler and duplex ultrasound were performed of bilateral carotid and vertebral arteries in the neck. COMPARISON:  No prior duplex FINDINGS: Criteria: Quantification of carotid stenosis is based on velocity parameters that correlate the residual internal carotid diameter with NASCET-based stenosis levels, using the diameter of the distal internal carotid lumen as the denominator for stenosis measurement. The following velocity measurements were obtained: RIGHT ICA:  Systolic 782 cm/sec, Diastolic 21 cm/sec CCA:  956 cm/sec SYSTOLIC ICA/CCA RATIO:  0.8 ECA:  209 cm/sec LEFT ICA:  Systolic 213 cm/sec, Diastolic 18 cm/sec CCA:  086 cm/sec SYSTOLIC ICA/CCA RATIO:  1.1 ECA:  157 cm/sec Right Brachial SBP: Not acquired Left Brachial SBP: Not acquired RIGHT CAROTID ARTERY: No significant calcifications of the right common carotid artery. Intermediate waveform maintained. Heterogeneous and partially calcified plaque at the right carotid bifurcation. No significant lumen shadowing. Low resistance waveform of the right ICA. No significant tortuosity. RIGHT VERTEBRAL ARTERY: Antegrade flow with low resistance waveform. LEFT CAROTID ARTERY: No significant calcifications of the left common carotid artery. Intermediate waveform maintained. Heterogeneous and partially calcified plaque at the left carotid bifurcation without significant lumen shadowing. Low resistance waveform of the left ICA. No significant tortuosity. LEFT VERTEBRAL ARTERY:  Antegrade flow with low resistance waveform. IMPRESSION: Color duplex indicates minimal heterogeneous and calcified plaque, with no hemodynamically significant stenosis by duplex criteria in the extracranial cerebrovascular circulation. Signed, Dulcy Fanny. Earleen Newport, DO [facility] Radiology Electronically Signed   By: Corrie Mckusick D.O.   On: 02/19/2017 10:14   Dg Chest Port 1  View  Result Date: 02/19/2017 CLINICAL DATA:  Atrial fibrillation.  Possible TIA. EXAM: PORTABLE CHEST 1 VIEW COMPARISON:  August 15, 2017 FINDINGS: Developing infiltrate in the medial right lung base, new in the interval. No pneumothorax. The cardiomediastinal silhouette is stable. No pulmonary nodules or masses. IMPRESSION: Developing infiltrate in the medial right lung base, new in the interval. Recommend follow-up to resolution. Electronically Signed   By: Dorise Bullion III M.D   On: 02/19/2017 12:00   ASSESSMENT AND PLAN:   TIA (transient ischemic attack)  MRI brain: No CVA. Continue ASA and Lipitor. Per Dr. Irish Elders, start Eliquis due to P Afib. PT pending.  Paroxysmal atrial fibrillation Per Dr. Irish Elders, start Eliquis. Continue amiodarone.  PNA (CAP) with leukocytosis The patient has had cough on and off for his daughter. He had fever 99.8 last night. He also complains of right-sided chest pain with deep breath. Chest x-ray just now showedDeveloping infiltrate in the medial right lung base.  Start Levaquin IV, follow-up CBC, sputum culture and blood culture. Robitussin when necessary.    BPH (benign prostatic hyperplasia) - continue Proscar and Flomax.  All the records are reviewed and case discussed with Care Management/Social Worker. Management plans discussed with the patient, his daughter and granddaughter and they are in agreement.  CODE STATUS: Full Code  TOTAL TIME TAKING CARE OF THIS PATIENT: 46 minutes.   More than 50% of the time was spent in counseling/coordination of care: YES  POSSIBLE D/C IN 2 DAYS, DEPENDING ON CLINICAL CONDITION.   Demetrios Loll M.D on 02/19/2017 at 12:45 PM  Between 7am to 6pm - Pager - [phone]  After 6pm go to [email or web address] - [facility]  Office  [phone]  CC: Primary  care physician; [name], MD  Note: This dictation was prepared with Dragon dictation along with  smaller phrase technology. Any transcriptional errors that result from this process are unintentional.

## 2017-02-19 NOTE — Progress Notes (Signed)
ANTICOAGULATION CONSULT NOTE - Initial Consult  Pharmacy Consult for Apixaban (Eliquis)  Indication: atrial fibrillation  Allergies  Allergen Reactions  . Clindamycin Hcl     Other reaction(s): Unknown Reaction:  Unknown   . Clindamycin/Lincomycin Other (See Comments)    Reaction:  Unknown   . Sulfamethoxazole-Trimethoprim Rash   Patient Measurements: Height: 6' (182.9 cm) Weight: 192 lb (87.1 kg) IBW/kg (Calculated) : 77.6  Vital Signs: Temp: 98.6 F (37 C) (05/26 1044) Temp Source: Oral (05/26 1044) BP: 148/63 (05/26 1044) Pulse Rate: 71 (05/26 1044)   Recent Labs  02/18/17 1935  HGB 13.5  HCT 40.5  PLT 225  CREATININE 1.08  TROPONINI <0.03    Estimated Creatinine Clearance: 59.9 mL/min (by C-G formula based on SCr of 1.08 mg/dL).   Medical History: Past Medical History:  Diagnosis Date  . Aneurysm ([address])   . Atrial fibrillation ([place])   . Blockage of a bile duct   . BPH with obstruction/lower urinary tract symptoms   . Cellulitis of scrotum   . Elevated PSA   . Epididymitis   . Gallstone   . Gross hematuria   . Kidney stones   . Nocturia   . Testicular pain   . Urinary retention     Assessment: 83 yo male with PMH of paroxysmal A. Fib. Pharmacy consulted for apixaban (Eliquis) dosing.   Plan:  Enoxaparin 40mg  every 24 hours has been discontinued.  Will start patient on Apixaban (Eliquis) 5mg  BID.  Monitor patient for S/Sx of bleeding.    Pernell Dupre, PharmD, BCPS Clinical Pharmacist 02/19/2017 12:35 PM

## 2017-02-19 NOTE — Progress Notes (Signed)
PT Cancellation Note  Patient Details Name: Chris Simon MRN: [id number] DOB: 04-01-1934   Cancelled Treatment:    Reason Eval/Treat Not Completed: Patient not medically ready. PT presented for evaluation at 1039; treatment held per RN request, stating patient is not currently feeling well. Will check back later if time permits.   Dorice Lamas, PT, DPT 02/19/2017, 11:02 AM

## 2017-02-19 NOTE — Discharge Instructions (Signed)
Heart healthy diet

## 2017-02-19 NOTE — Consult Note (Signed)
Reason for Consult: aphasia  Referring Physician: Dr. Bridgett Larsson  CC: Aphasia   HPI: Chris Simon is an 83 y.o. male  who presents with Transient 5 minute episode of word salad and expressive dysphasia.  Patient does have a history of paroxysmal A. Fib and is on ASA. Currently back to baseline, but still has periods of confusion.   CTH no acute abnormalities.   Past Medical History:  Diagnosis Date  . Aneurysm ([place])   . Atrial fibrillation ([place])   . Blockage of a bile duct   . BPH with obstruction/lower urinary tract symptoms   . Cellulitis of scrotum   . Elevated PSA   . Epididymitis   . Gallstone   . Gross hematuria   . Kidney stones   . Nocturia   . Testicular pain   . Urinary retention     Past Surgical History:  Procedure Laterality Date  . CHOLECYSTECTOMY N/A 07/11/2015   Procedure: LAPAROSCOPIC CHOLECYSTECTOMY;  Surgeon: Rolm Bookbinder, MD;  Location: [facility];  Service: General;  Laterality: N/A;  . ERCP N/A 07/09/2015   Procedure: ENDOSCOPIC RETROGRADE CHOLANGIOPANCREATOGRAPHY (ERCP);  Surgeon: Clarene Essex, MD;  Location: [facility] ENDOSCOPY;  Service: Endoscopy;  Laterality: N/A;    Family History  Problem Relation Age of Onset  . Prostate cancer Brother   . Kidney disease Neg Hx   . Bladder Cancer Neg Hx     Social History:  reports that he has quit smoking. He has quit using smokeless tobacco. He reports that he does not drink alcohol or use drugs.  Allergies  Allergen Reactions  . Clindamycin Hcl     Other reaction(s): Unknown Reaction:  Unknown   . Clindamycin/Lincomycin Other (See Comments)    Reaction:  Unknown   . Sulfamethoxazole-Trimethoprim Rash    Medications: I have reviewed the patient's current medications.  ROS: History obtained from the patient and and mostly family  General ROS: negative for - chills, fatigue, fever, night sweats, weight gain or weight loss Psychological ROS: negative for - behavioral disorder, hallucinations, memory  difficulties, mood swings or suicidal ideation Ophthalmic ROS: negative for - blurry vision, double vision, eye pain or loss of vision ENT ROS: negative for - epistaxis, nasal discharge, oral lesions, sore throat, tinnitus or vertigo Allergy and Immunology ROS: negative for - hives or itchy/watery eyes Hematological and Lymphatic ROS: negative for - bleeding problems, bruising or swollen lymph nodes Endocrine ROS: negative for - galactorrhea, hair pattern changes, polydipsia/polyuria or temperature intolerance Respiratory ROS: negative for - cough, hemoptysis, shortness of breath or wheezing Cardiovascular ROS: negative for - chest pain, dyspnea on exertion, edema or irregular heartbeat Gastrointestinal ROS: negative for - abdominal pain, diarrhea, hematemesis, nausea/vomiting or stool incontinence Genito-Urinary ROS: negative for - dysuria, hematuria, incontinence or urinary frequency/urgency Musculoskeletal ROS: negative for - joint swelling or muscular weakness Neurological ROS: as noted in HPI Dermatological ROS: negative for rash and skin lesion changes  Physical Examination: Blood pressure (!) 148/63, pulse 71, temperature 98.6 F (37 C), temperature source Oral, resp. rate 20, height 6' (1.829 m), weight 87.1 kg (192 lb), SpO2 93 %.  HEENT-  Normocephalic, no lesions, without obvious abnormality.  Normal external eye and conjunctiva.  Normal TM's bilaterally.  Normal auditory canals and external ears. Normal external nose, mucus membranes and septum.  Normal pharynx. Cardiovascular- irreg, irreg with tachycardia, pulses palpable throughout   Lungs- chest clear, no wheezing, rales, normal symmetric air entry, Heart exam - S1, S2 normal, no murmur, no gallop,  rate regular Abdomen- soft, non-tender; bowel sounds normal; no masses,  no organomegaly Extremities- less then 2 second capillary refill Lymph-no adenopathy palpable Musculoskeletal-no joint tenderness, deformity or  swelling Skin-warm and dry, no hyperpigmentation, vitiligo, or suspicious lesions  Neurological Examination   Mental Status: Alert,to name and lcoation only . Cranial Nerves: II: Discs flat bilaterally; Visual fields grossly normal, pupils equal, round, reactive to light and accommodation III,IV, VI: ptosis not present, extra-ocular motions intact bilaterally V,VII: smile symmetric, facial light touch sensation normal bilaterally VIII: hearing normal bilaterally IX,X: gag reflex present XI: bilateral shoulder shrug XII: midline tongue extension Motor: Right : Upper extremity   5/5    Left:     Upper extremity   5/5  Lower extremity   5/5     Lower extremity   5/5 Tone and bulk:normal tone throughout; no atrophy noted Sensory: Pinprick and light touch intact throughout, bilaterally Deep Tendon Reflexes: 2+ and symmetric throughout Plantars: Right: downgoing   Left: downgoing Cerebellar: normal finger-to-nose, normal rapid alternating movements and normal heel-to-shin test Gait: not tested.      Laboratory Studies:   Basic Metabolic Panel:  Recent Labs Lab 02/18/17 1935  NA 138  K 3.6  CL 103  CO2 27  GLUCOSE 136*  BUN 16  CREATININE 1.08  CALCIUM 8.7*    Liver Function Tests:  Recent Labs Lab 02/18/17 1935  AST 19  ALT 12*  ALKPHOS 73  BILITOT 0.8  PROT 7.4  ALBUMIN 3.7   No results for input(s): LIPASE, AMYLASE in the last 168 hours. No results for input(s): AMMONIA in the last 168 hours.  CBC:  Recent Labs Lab 02/18/17 1935  WBC 12.6*  HGB 13.5  HCT 40.5  MCV 94.1  PLT 225    Cardiac Enzymes:  Recent Labs Lab 02/18/17 1935  TROPONINI <0.03    BNP: Invalid input(s): POCBNP  CBG: No results for input(s): GLUCAP in the last 168 hours.  Microbiology: Results for orders placed or performed in visit on 04/23/16  Microscopic Examination     Status: Abnormal   Collection Time: 04/23/16  2:43 PM  Result Value Ref Range Status   WBC, UA  >30 (A) 0 - 5 /hpf Final   RBC, UA None seen 0 - 2 /hpf Final   Epithelial Cells (non renal) 0-10 0 - 10 /hpf Final   Bacteria, UA Moderate (A) None seen/Few Final  CULTURE, URINE COMPREHENSIVE     Status: Abnormal   Collection Time: 04/23/16  3:26 PM  Result Value Ref Range Status   Urine Culture, Comprehensive Final report (A)  Final   Result 1 Escherichia coli (A)  Final    Comment: Greater than 100,000 colony forming units per mL   ANTIMICROBIAL SUSCEPTIBILITY Comment  Final    Comment:       ** S = Susceptible; I = Intermediate; R = Resistant **                    P = Positive; N = Negative             MICS are expressed in micrograms per mL    Antibiotic                 RSLT#1    RSLT#2    RSLT#3    RSLT#4 Amoxicillin/Clavulanic Acid    S Ampicillin  R Cefepime                       S Ceftriaxone                    S Cefuroxime                     I Cephalothin                    R Ciprofloxacin                  R Ertapenem                      S Gentamicin                     S Imipenem                       S Levofloxacin                   R Nitrofurantoin                 S Piperacillin                   R Tetracycline                   S Tobramycin                     S Trimethoprim/Sulfa             S     Coagulation Studies: No results for input(s): LABPROT, INR in the last 72 hours.  Urinalysis: No results for input(s): COLORURINE, LABSPEC, PHURINE, GLUCOSEU, HGBUR, BILIRUBINUR, KETONESUR, PROTEINUR, UROBILINOGEN, NITRITE, LEUKOCYTESUR in the last 168 hours.  Invalid input(s): APPERANCEUR  Lipid Panel:     Component Value Date/Time   CHOL 116 02/19/2017 0509   TRIG 56 02/19/2017 0509   HDL 40 (L) 02/19/2017 0509   CHOLHDL 2.9 02/19/2017 0509   VLDL 11 02/19/2017 0509   LDLCALC 65 02/19/2017 0509    HgbA1C:  Lab Results  Component Value Date   HGBA1C 5.6 07/11/2015    Urine Drug Screen:  No results found for: LABOPIA,  COCAINSCRNUR, LABBENZ, AMPHETMU, THCU, LABBARB  Alcohol Level: No results for input(s): ETH in the last 168 hours.  Other results: EKG: irreg, A-fib.  Imaging: Ct Head Wo Contrast  Result Date: 02/18/2017 CLINICAL DATA:  Slurred speech EXAM: CT HEAD WITHOUT CONTRAST TECHNIQUE: Contiguous axial images were obtained from the base of the skull through the vertex without intravenous contrast. COMPARISON:  06/21/2014 FINDINGS: Brain: Mild atrophic changes are noted. No findings to suggest acute hemorrhage, acute infarction or space-occupying mass lesion are noted. Vascular: No hyperdense vessel or unexpected calcification. Skull: Normal. Negative for fracture or focal lesion. Sinuses/Orbits: No acute finding. Other: None. IMPRESSION: Atrophic changes without acute abnormality. Electronically Signed   By: Inez Catalina M.D.   On: 02/18/2017 19:55   US Carotid Bilateral (at [facility] Only)  Result Date: 02/19/2017 CLINICAL DATA:  83 year old male with a history of TIA Cardiovascular risk factors include known prior stroke/ TIA. EXAM: BILATERAL CAROTID DUPLEX ULTRASOUND TECHNIQUE: Pearline Cables scale imaging, color Doppler and duplex ultrasound were performed of bilateral carotid and vertebral arteries in the neck. COMPARISON:  No prior duplex FINDINGS:  Criteria: Quantification of carotid stenosis is based on velocity parameters that correlate the residual internal carotid diameter with NASCET-based stenosis levels, using the diameter of the distal internal carotid lumen as the denominator for stenosis measurement. The following velocity measurements were obtained: RIGHT ICA:  Systolic 545 cm/sec, Diastolic 21 cm/sec CCA:  625 cm/sec SYSTOLIC ICA/CCA RATIO:  0.8 ECA:  209 cm/sec LEFT ICA:  Systolic 638 cm/sec, Diastolic 18 cm/sec CCA:  937 cm/sec SYSTOLIC ICA/CCA RATIO:  1.1 ECA:  157 cm/sec Right Brachial SBP: Not acquired Left Brachial SBP: Not acquired RIGHT CAROTID ARTERY: No significant calcifications of the right  common carotid artery. Intermediate waveform maintained. Heterogeneous and partially calcified plaque at the right carotid bifurcation. No significant lumen shadowing. Low resistance waveform of the right ICA. No significant tortuosity. RIGHT VERTEBRAL ARTERY: Antegrade flow with low resistance waveform. LEFT CAROTID ARTERY: No significant calcifications of the left common carotid artery. Intermediate waveform maintained. Heterogeneous and partially calcified plaque at the left carotid bifurcation without significant lumen shadowing. Low resistance waveform of the left ICA. No significant tortuosity. LEFT VERTEBRAL ARTERY:  Antegrade flow with low resistance waveform. IMPRESSION: Color duplex indicates minimal heterogeneous and calcified plaque, with no hemodynamically significant stenosis by duplex criteria in the extracranial cerebrovascular circulation. Signed, Dulcy Fanny. Earleen Newport, DO [facility] Radiology Electronically Signed   By: Corrie Mckusick D.O.   On: 02/19/2017 10:14     Assessment/Plan:  83 y.o. male  who presents with Transient 5 minute episode of word salad and expressive dysphasia.  Patient does have a history of paroxysmal A. Fib and is on ASA. Currently back to baseline, but still has periods of confusion.   CTH no acute abnormalities.    MRI reviewed and prelim don't see any acute abnormalities  On ASA at home. Would strongly consider anticoagulation as out pt which was discussed with family and pt.   Would start Xarelto or Eliquis which can be started prior to d/c   02/19/2017, 11:22 AM

## 2017-02-19 NOTE — Progress Notes (Signed)
SLP Cancellation Note  Patient Details Name: Mckennon Zwart MRN: [id number] DOB: Aug 20, 1934   Cancelled treatment:       Reason Eval/Treat Not Completed:  (chart reviewed; consulted pt and family; NSG) Met w/ pt and family. Pt was verbally conversive in general conversation and could answer basic questions re: self; followed 1-2 commands appropriately. Speech was clear. Pt and family denied any of the difficulty "getting his words out" currently.  However, pt repeated a brief story(telling it 2x) and did not appear aware of this. When family was asked about this further outside of his room, the Daughter(who lives w/ him at home) stated pt "often" repeats himself and has not wanted to do his bills saying he will do the bills "later" but never does. She said this has been worsening over some time she noticed; she is aware of signs of Cognitive changes d/t pt's Wife(?) having such.  Recommended pt and family f/u w/ a Neurologist to have a full Cognitive assessment post discharge to formally make an assessment to indicate need for tx/management. Due to pt being able to adequately verbalize his wants/needs in communication and denial of previous word-finding issues, ST will hold on a language eval while in the hospital; no overt s/s of dysphagia have been noted by NSG screen, and pt is on a regular diet per MD order. Daughter, family agreed.   Orinda Kenner, MS, CCC-SLP Denetria Luevanos 02/19/2017, 11:38 AM

## 2017-02-20 DIAGNOSIS — J189 Pneumonia, unspecified organism: Secondary | ICD-10-CM | POA: Diagnosis present

## 2017-02-20 DIAGNOSIS — Z87891 Personal history of nicotine dependence: Secondary | ICD-10-CM | POA: Diagnosis not present

## 2017-02-20 DIAGNOSIS — G459 Transient cerebral ischemic attack, unspecified: Secondary | ICD-10-CM | POA: Diagnosis present

## 2017-02-20 DIAGNOSIS — N401 Enlarged prostate with lower urinary tract symptoms: Secondary | ICD-10-CM | POA: Diagnosis present

## 2017-02-20 DIAGNOSIS — Z881 Allergy status to other antibiotic agents status: Secondary | ICD-10-CM | POA: Diagnosis not present

## 2017-02-20 DIAGNOSIS — Z7982 Long term (current) use of aspirin: Secondary | ICD-10-CM | POA: Diagnosis not present

## 2017-02-20 DIAGNOSIS — Z882 Allergy status to sulfonamides status: Secondary | ICD-10-CM | POA: Diagnosis not present

## 2017-02-20 DIAGNOSIS — Z79899 Other long term (current) drug therapy: Secondary | ICD-10-CM | POA: Diagnosis not present

## 2017-02-20 DIAGNOSIS — I480 Paroxysmal atrial fibrillation: Secondary | ICD-10-CM | POA: Diagnosis present

## 2017-02-20 LAB — HEMOGLOBIN A1C
HEMOGLOBIN A1C: 5.9 % — AB (ref 4.8–5.6)
Mean Plasma Glucose: 123 mg/dL

## 2017-02-20 LAB — CBC
HCT: 38.5 % — ABNORMAL LOW (ref 40.0–52.0)
HEMOGLOBIN: 12.9 g/dL — AB (ref 13.0–18.0)
MCH: 31.6 pg (ref 26.0–34.0)
MCHC: 33.5 g/dL (ref 32.0–36.0)
MCV: 94.4 fL (ref 80.0–100.0)
Platelets: 199 10*3/uL (ref 150–440)
RBC: 4.07 MIL/uL — AB (ref 4.40–5.90)
RDW: 13.2 % (ref 11.5–14.5)
WBC: 14 10*3/uL — ABNORMAL HIGH (ref 3.8–10.6)

## 2017-02-20 LAB — HIV ANTIBODY (ROUTINE TESTING W REFLEX): HIV Screen 4th Generation wRfx: NONREACTIVE

## 2017-02-20 LAB — STREP PNEUMONIAE URINARY ANTIGEN: Strep Pneumo Urinary Antigen: NEGATIVE

## 2017-02-20 MED ORDER — BISACODYL 5 MG PO TBEC: 5.0000 mg | DELAYED_RELEASE_TABLET | Freq: Every day | ORAL | Status: DC | PRN

## 2017-02-20 MED ORDER — SENNA 8.6 MG PO TABS: 1.0000 | ORAL_TABLET | Freq: Every day | ORAL | Status: DC | PRN

## 2017-02-20 NOTE — Plan of Care (Signed)
Problem: Activity: Goal: Ability to tolerate increased activity will improve Outcome: Progressing Pt tolerating ambulating to bathroom independently well.

## 2017-02-20 NOTE — Progress Notes (Signed)
[facility] [place] NAME: Chris Simon    MR#:  [id number]  DATE OF BIRTH:  [birth year]  SUBJECTIVE:  CHIEF COMPLAINT:   Chief Complaint  Patient presents with  . Aphasia  . Groin Pain   Right-sided chest pain while deep breath and cough per his daughter. The patient denies. REVIEW OF SYSTEMS:  Review of Systems  Constitutional: Negative for chills, fever and malaise/fatigue.  HENT: Negative for congestion.   Eyes: Negative for blurred vision and double vision.  Respiratory: Positive for cough. Negative for hemoptysis, sputum production, shortness of breath, wheezing and stridor.   Cardiovascular: Positive for chest pain. Negative for palpitations and leg swelling.  Gastrointestinal: Negative for abdominal pain, blood in stool, diarrhea, melena, nausea and vomiting.  Genitourinary: Negative for dysuria and hematuria.  Musculoskeletal: Negative for back pain.  Skin: Negative for itching and rash.  Neurological: Negative for dizziness, focal weakness, loss of consciousness and weakness.  Psychiatric/Behavioral: Negative for depression. The patient is not nervous/anxious.     DRUG ALLERGIES:   Allergies  Allergen Reactions  . Clindamycin Hcl     Other reaction(s): Unknown Reaction:  Unknown   . Clindamycin/Lincomycin Other (See Comments)    Reaction:  Unknown   . Sulfamethoxazole-Trimethoprim Rash   VITALS:  Blood pressure 137/65, pulse 88, temperature 98 F (36.7 C), temperature source Oral, resp. rate (!) 21, height 6' (1.829 m), weight 192 lb (87.1 kg), SpO2 95 %. PHYSICAL EXAMINATION:  Physical Exam  Constitutional: He is oriented to person, place, and time and well-developed, well-nourished, and in no distress.  HENT:  Head: Normocephalic.  Mouth/Throat: Oropharynx is clear and moist.  Eyes: Conjunctivae and EOM are normal. No scleral icterus.  Neck: Normal range of motion. Neck supple. No JVD present. No tracheal  deviation present.  Cardiovascular: Normal rate, regular rhythm and normal heart sounds.  Exam reveals no gallop.   No murmur heard. Pulmonary/Chest: Effort normal and breath sounds normal. No respiratory distress. He has no wheezes. He has no rales.  Abdominal: Soft. Bowel sounds are normal. He exhibits no distension. There is no tenderness.  Musculoskeletal: Normal range of motion. He exhibits no edema or tenderness.  Neurological: He is alert and oriented to person, place, and time. No cranial nerve deficit.  Skin: No rash noted. No erythema.  Psychiatric: Affect normal.   LABORATORY PANEL:  Male CBC  Recent Labs Lab 02/20/17 0459  WBC 14.0*  HGB 12.9*  HCT 38.5*  PLT 199   ------------------------------------------------------------------------------------------------------------------ Chemistries   Recent Labs Lab 02/18/17 1935  NA 138  K 3.6  CL 103  CO2 27  GLUCOSE 136*  BUN 16  CREATININE 1.08  CALCIUM 8.7*  AST 19  ALT 12*  ALKPHOS 73  BILITOT 0.8   RADIOLOGY:  No results found. ASSESSMENT AND PLAN:   TIA (transient ischemic attack)  MRI brain: No CVA. Continue ASA and Lipitor. Per Dr. Irish Elders, started Eliquis due to P Afib. PT evaluation: no PT need.  Paroxysmal atrial fibrillation Per Dr. Irish Elders, started Eliquis. Continue amiodarone.  PNA (CAP) with leukocytosis Chest x-ray just now showedDeveloping infiltrate in the medial right lung base. Worsening leukocytosis. Continue  Levaquin IV, follow-up CBC, sputum culture and blood culture. Robitussin when necessary.    BPH (benign prostatic hyperplasia) - continue Proscar and Flomax.  All the records are reviewed and case discussed with Care Management/Social Worker. Management plans discussed with the patient, his daughter and granddaughter and they are  in agreement.  CODE STATUS: Full Code  TOTAL TIME TAKING CARE OF THIS PATIENT: 37 minutes.   More than 50% of the time was spent in  counseling/coordination of care: YES  POSSIBLE D/C IN 2 DAYS, DEPENDING ON CLINICAL CONDITION.   Demetrios Loll M.D on 02/20/2017 at 11:27 AM  Between 7am to 6pm - Pager - [phone]  After 6pm go to [email or web address] - Proofreader  [facility]  Office  [phone]  CC: Primary care physician; [name], MD  Note: This dictation was prepared with Dragon dictation along with smaller phrase technology. Any transcriptional errors that result from this process are unintentional.

## 2017-02-21 LAB — ECHOCARDIOGRAM COMPLETE
FS: 33 % (ref 28–44)
Height: 72 in
IV/PV OW: 0.93
LA ID, A-P, ES: 37 mm
LA diam end sys: 37 mm
LADIAMINDEX: 1.75 cm/m2
LDCA: 4.15 cm2
LV PW d: 11.2 mm — AB (ref 0.6–1.1)
LVOT diameter: 23 mm
WEIGHTICAEL: 3072 [oz_av]

## 2017-02-21 LAB — CBC
HEMATOCRIT: 36.9 % — AB (ref 40.0–52.0)
HEMOGLOBIN: 12.6 g/dL — AB (ref 13.0–18.0)
MCH: 32 pg (ref 26.0–34.0)
MCHC: 34.1 g/dL (ref 32.0–36.0)
MCV: 93.9 fL (ref 80.0–100.0)
Platelets: 211 10*3/uL (ref 150–440)
RBC: 3.93 MIL/uL — AB (ref 4.40–5.90)
RDW: 13.2 % (ref 11.5–14.5)
WBC: 10.5 10*3/uL (ref 3.8–10.6)

## 2017-02-21 LAB — CREATININE, SERUM: CREATININE: 0.93 mg/dL (ref 0.61–1.24)

## 2017-02-21 MED ORDER — APIXABAN 5 MG PO TABS: 5.0000 mg | ORAL_TABLET | Freq: Two times a day (BID) | ORAL | 0 refills | Status: DC

## 2017-02-21 MED ORDER — LEVOFLOXACIN 500 MG PO TABS: 500.0000 mg | ORAL_TABLET | Freq: Every day | ORAL | 0 refills | Status: AC

## 2017-02-21 MED ORDER — GUAIFENESIN 100 MG/5ML PO SOLN: 5.0000 mL | ORAL | 0 refills | Status: DC | PRN

## 2017-02-21 NOTE — Discharge Summary (Signed)
[facility] [place] NAME: Chris Simon    MR#:  [id number]  DATE OF BIRTH:  1933-10-18  DATE OF ADMISSION:  02/18/2017   ADMITTING PHYSICIAN: Lance Coon, MD  DATE OF DISCHARGE: 02/21/2017 10:05 AM  PRIMARY CARE PHYSICIAN: [name], MD   ADMISSION DIAGNOSIS:  Transient cerebral ischemia, unspecified type [G45.9] DISCHARGE DIAGNOSIS:  Principal Problem:   TIA (transient ischemic attack) Active Problems:   Paroxysmal atrial fibrillation (HCC)   BPH (benign prostatic hyperplasia)   Pneumonia  SECONDARY DIAGNOSIS:   Past Medical History:  Diagnosis Date  . Aneurysm ([place])   . Atrial fibrillation ([place])   . Blockage of a bile duct   . BPH with obstruction/lower urinary tract symptoms   . Cellulitis of scrotum   . Elevated PSA   . Epididymitis   . Gallstone   . Gross hematuria   . Kidney stones   . Nocturia   . Testicular pain   . Urinary retention    HOSPITAL COURSE:   TIA (transient ischemic attack)  MRI brain: No CVA. Continue ASA and Lipitor. Per Dr. Irish Elders, started Eliquis due to P Afib. PT evaluation: no PT need.  Paroxysmal atrial fibrillation Per Dr. Irish Elders, started Eliquis. Continue amiodarone.  PNA (CAP) with leukocytosis Chest x-ray just now showed developing infiltrate in the medial right lung base. Worsening leukocytosis. He has been treated wtih Levaquin, leukocytosis improved and blood culture is negative. Robitussin when necessary. By mouth Levaquin for 5 more days.  BPH (benign prostatic hyperplasia) - continue Proscar and Flomax.  DISCHARGE CONDITIONS:  Stable, discharged to home today. CONSULTS OBTAINED:  Treatment Team:  Leotis Pain, MD DRUG ALLERGIES:   Allergies  Allergen Reactions  . Clindamycin Hcl     Other reaction(s): Unknown Reaction:  Unknown   . Clindamycin/Lincomycin Other (See Comments)    Reaction:  Unknown   . Sulfamethoxazole-Trimethoprim Rash    DISCHARGE MEDICATIONS:   Allergies as of 02/21/2017      Reactions   Clindamycin Hcl    Other reaction(s): Unknown Reaction:  Unknown    Clindamycin/lincomycin Other (See Comments)   Reaction:  Unknown    Sulfamethoxazole-trimethoprim Rash      Medication List    STOP taking these medications   cefdinir 300 MG capsule Commonly known as:  OMNICEF     TAKE these medications   AEROCHAMBER MV inhaler Use as instructed   albuterol 108 (90 Base) MCG/ACT inhaler Commonly known as:  PROVENTIL HFA;VENTOLIN HFA Inhale 2 puffs into the lungs every 6 (six) hours as needed for wheezing or shortness of breath.   amiodarone 200 MG tablet Commonly known as:  PACERONE Take 200 mg by mouth daily.   apixaban 5 MG Tabs tablet Commonly known as:  ELIQUIS Take 1 tablet (5 mg total) by mouth 2 (two) times daily.   aspirin EC 325 MG tablet Take 325 mg by mouth daily.   Cranberry 250 MG Caps Take 250 mg by mouth daily.   finasteride 5 MG tablet Commonly known as:  PROSCAR Take 1 tablet (5 mg total) by mouth daily.   guaiFENesin 100 MG/5ML Soln Commonly known as:  ROBITUSSIN Take 5 mLs (100 mg total) by mouth every 4 (four) hours as needed for cough or to loosen phlegm.   isosorbide mononitrate 30 MG 24 hr tablet Commonly known as:  IMDUR Take 1 tablet by mouth daily.   levofloxacin 500 MG tablet Commonly known as:  LEVAQUIN Take  1 tablet (500 mg total) by mouth daily.   multivitamin with minerals Tabs tablet Take 1 tablet by mouth daily.   rosuvastatin 20 MG tablet Commonly known as:  CRESTOR Take 20 mg by mouth daily.   tamsulosin 0.4 MG Caps capsule Commonly known as:  FLOMAX TAKE 1 CAPSULE BY MOUTH DAILY   vitamin B-12 500 MCG tablet Commonly known as:  CYANOCOBALAMIN Take 500 mcg by mouth daily.        DISCHARGE INSTRUCTIONS:  See AVS.  If you experience worsening of your admission symptoms, develop shortness of breath, life threatening emergency, suicidal  or homicidal thoughts you must seek medical attention immediately by calling 911 or calling your MD immediately  if symptoms less severe.  You Must read complete instructions/literature along with all the possible adverse reactions/side effects for all the Medicines you take and that have been prescribed to you. Take any new Medicines after you have completely understood and accpet all the possible adverse reactions/side effects.   Please note  You were cared for by a hospitalist during your hospital stay. If you have any questions about your discharge medications or the care you received while you were in the hospital after you are discharged, you can call the unit and asked to speak with the hospitalist on call if the hospitalist that took care of you is not available. Once you are discharged, your primary care physician will handle any further medical issues. Please note that NO REFILLS for any discharge medications will be authorized once you are discharged, as it is imperative that you return to your primary care physician (or establish a relationship with a primary care physician if you do not have one) for your aftercare needs so that they can reassess your need for medications and monitor your lab values.    On the day of Discharge:  VITAL SIGNS:  Blood pressure (!) 139/55, pulse 75, temperature 98.5 F (36.9 C), temperature source Oral, resp. rate 20, height 6' (1.829 m), weight 192 lb (87.1 kg), SpO2 95 %. PHYSICAL EXAMINATION:  GENERAL:  83 y.o.-year-old patient lying in the bed with no acute distress.  EYES: Pupils equal, round, reactive to light and accommodation. No scleral icterus. Extraocular muscles intact.  HEENT: Head atraumatic, normocephalic. Oropharynx and nasopharynx clear.  NECK:  Supple, no jugular venous distention. No thyroid enlargement, no tenderness.  LUNGS: Normal breath sounds bilaterally, no wheezing, rales,rhonchi or crepitation. No use of accessory muscles of  respiration.  CARDIOVASCULAR: S1, S2 normal. No murmurs, rubs, or gallops.  ABDOMEN: Soft, non-tender, non-distended. Bowel sounds present. No organomegaly or mass.  EXTREMITIES: No pedal edema, cyanosis, or clubbing.  NEUROLOGIC: Cranial nerves II through XII are intact. Muscle strength 5/5 in all extremities. Sensation intact. Gait not checked.  PSYCHIATRIC: The patient is alert and oriented x 3.  SKIN: No obvious rash, lesion, or ulcer.  DATA REVIEW:   CBC  Recent Labs Lab 02/21/17 0414  WBC 10.5  HGB 12.6*  HCT 36.9*  PLT 211    Chemistries   Recent Labs Lab 02/18/17 1935 02/21/17 0414  NA 138  --   K 3.6  --   CL 103  --   CO2 27  --   GLUCOSE 136*  --   BUN 16  --   CREATININE 1.08 0.93  CALCIUM 8.7*  --   AST 19  --   ALT 12*  --   ALKPHOS 73  --   BILITOT 0.8  --  Microbiology Results  Results for orders placed or performed during the hospital encounter of 02/18/17  Culture, blood (routine x 2) Call MD if unable to obtain prior to antibiotics being given     Status: None (Preliminary result)   Collection Time: 02/19/17  1:15 PM  Result Value Ref Range Status   Specimen Description BLOOD LEFT ASSIST CONTROL  Final   Special Requests   Final    BOTTLES DRAWN AEROBIC AND ANAEROBIC Blood Culture adequate volume   Culture NO GROWTH 2 DAYS  Final   Report Status PENDING  Incomplete  Culture, blood (routine x 2) Call MD if unable to obtain prior to antibiotics being given     Status: None (Preliminary result)   Collection Time: 02/19/17  1:25 PM  Result Value Ref Range Status   Specimen Description BLOOD LEFT ASSIST CONTROL  Final   Special Requests   Final    BOTTLES DRAWN AEROBIC AND ANAEROBIC Blood Culture adequate volume   Culture NO GROWTH 2 DAYS  Final   Report Status PENDING  Incomplete    RADIOLOGY:  No results found.   Management plans discussed with the patient, His daughter and they are in agreement.  CODE STATUS: Prior   TOTAL TIME  TAKING CARE OF THIS PATIENT: 33 minutes.    Demetrios Loll M.D on 02/21/2017 at 1:12 PM  Between 7am to 6pm - Pager - [phone]  After 6pm go to [email or web address] - Proofreader  [facility]  Office  [phone]  CC: Primary care physician; [name], MD   Note: This dictation was prepared with Dragon dictation along with smaller phrase technology. Any transcriptional errors that result from this process are unintentional.

## 2017-02-24 LAB — CULTURE, BLOOD (ROUTINE X 2)
Culture: NO GROWTH
Culture: NO GROWTH
Special Requests: ADEQUATE
Special Requests: ADEQUATE

## 2017-03-11 ENCOUNTER — Other Ambulatory Visit: Payer: Self-pay

## 2017-03-16 ENCOUNTER — Telehealth: Payer: Self-pay | Admitting: General Surgery

## 2017-03-16 ENCOUNTER — Telehealth: Payer: Self-pay

## 2017-03-16 ENCOUNTER — Ambulatory Visit (INDEPENDENT_AMBULATORY_CARE_PROVIDER_SITE_OTHER): Payer: Medicare HMO | Admitting: General Surgery

## 2017-03-16 ENCOUNTER — Encounter: Payer: Self-pay | Admitting: General Surgery

## 2017-03-16 VITALS — BP 123/75 | HR 56 | Temp 98.2°F | Ht 72.0 in | Wt 185.0 lb

## 2017-03-16 DIAGNOSIS — K429 Umbilical hernia without obstruction or gangrene: Secondary | ICD-10-CM

## 2017-03-16 DIAGNOSIS — K4090 Unilateral inguinal hernia, without obstruction or gangrene, not specified as recurrent: Secondary | ICD-10-CM | POA: Diagnosis not present

## 2017-03-16 NOTE — Telephone Encounter (Signed)
Pt advised of pre op date/time and sx date. Sx: 04/07/17 with Dr Golden Circle left inguinal hernia repair.  Pre op: 04/04/17 @ 10:30am--Office.   Patient made aware to call [phone], between 1-3:00pm the day before surgery, to find out what time to arrive.     Patient will pay his 295.00 co pay prior to surgery.

## 2017-03-16 NOTE — Patient Instructions (Signed)
We have scheduled your open left inguinal hernia repair for July 12th with Dr. Adonis Huguenin.  We will send over your medical clearance paperwork.  Please see your blue sheet for further instructions.

## 2017-03-16 NOTE — Telephone Encounter (Signed)
Medical and Anticoagulant clearance faxed to Dr.Feldpausch at this time.  Neurology Clearance faxed to [facility] as well.

## 2017-03-16 NOTE — Addendum Note (Signed)
Addended by: Clayburn Pert T on: 03/16/2017 11:44 AM   Modules accepted: Orders, SmartSet

## 2017-03-16 NOTE — Progress Notes (Signed)
Outpatient Surgical Follow Up  03/16/2017  Geroge Gilliam is an 83 y.o. male.   Chief Complaint  Patient presents with  . Other    last seen 9/17 by Dr Dillon Bjork inguinal hernia and unbilical hernia    HPI: 83 year old male returns to clinic today to discuss left inguinal hernia repair. Patient will not complain of pain or discomfort to the area. His daughter times in that he has been having more significant pain since April. He was evaluated by his primary care provider and it was noted to be larger than it was last fall. He states that he occasionally has to push the area and will cause discomfort on occasion but not all the time. He also has an umbilical hernia he states that has never bothered him and has not changed in size over the last several years. He is eating well and denies any fevers, chills, nausea, vomiting, chest pain, shortness of breath, diarrhea, constipation. He does have a chronic cough per his daughter. He was admitted to the hospital last month with pneumonia and a TIA.  Past Medical History:  Diagnosis Date  . Aneurysm ([place])   . Atrial fibrillation ([place])   . Blockage of a bile duct   . BPH with obstruction/lower urinary tract symptoms   . Cellulitis of scrotum   . Elevated PSA   . Epididymitis   . Gallstone   . Gross hematuria   . Kidney stones   . Nocturia   . Testicular pain   . Urinary retention     Past Surgical History:  Procedure Laterality Date  . CHOLECYSTECTOMY N/A 07/11/2015   Procedure: LAPAROSCOPIC CHOLECYSTECTOMY;  Surgeon: Rolm Bookbinder, MD;  Location: [facility];  Service: General;  Laterality: N/A;  . ERCP N/A 07/09/2015   Procedure: ENDOSCOPIC RETROGRADE CHOLANGIOPANCREATOGRAPHY (ERCP);  Surgeon: Clarene Essex, MD;  Location: [facility];  Service: Endoscopy;  Laterality: N/A;    Family History  Problem Relation Age of Onset  . Prostate cancer Brother   . Kidney disease Neg Hx   . Bladder Cancer Neg Hx     Social History:   reports that he has quit smoking. He has quit using smokeless tobacco. He reports that he does not drink alcohol or use drugs.  Allergies:  Allergies  Allergen Reactions  . Clindamycin Hcl     Other reaction(s): Unknown Reaction:  Unknown   . Clindamycin/Lincomycin Other (See Comments)    Reaction:  Unknown   . Sulfamethoxazole-Trimethoprim Rash    Medications reviewed.    ROS A multipoint review of systems was completed. All pertinent positives and negatives are documented within the history of present illness and the remainder are negative   BP 123/75   Pulse (!) 56   Temp 98.2 F (36.8 C) (Oral)   Ht 6' (1.829 m)   Wt 83.9 kg (185 lb)   BMI 25.09 kg/m   Physical Exam Gen.: No acute distress Neck: Supple and nontender Lymph nodes: No evidence of cervical, clavicular, axillary lymphadenopathy Chest: Clear to auscultation Heart: Regular rhythm Abdomen: Soft, nontender, nondistended. Small and easily reducible umbilical hernia that is nontender. Moderate sized left inguinal hernia that is easily reducible and mildly tender on palpation. No evidence of right inguinal hernia. Extremities: Moves all extremities well.    No results found for this or any previous visit (from the past 48 hour(s)). No results found.  Assessment/Plan:  1. Left inguinal hernia 83 year old male with a symptomatic left inguinal hernia that is increasing in  size over the last year. Had a long conversation with patient about the risks of hernias and he states that he is willing to have it repaired. Discussed an open inguinal hernia repair in detail to include the risks, benefits, alternatives. The primary risks are pain, bleeding, infection, recurrence. Also discussed the possibility of removing a nerve were to be involved with the mesh that is used. Patient voiced understanding and desires to proceed. We will plan for repair on July 12. We will obtain medical and neurology clearances prior to  proceeding with operative room. He has appointments with both of those providers next week. Patient understands that he is a higher risk surgery patient secondary to his recent pneumonia and TIA.  2. Umbilical hernia without obstruction and without gangrene Patient was in a symptomatic umbilical hernia. Discussed the signs and symptoms of worsening in size or symptoms and return to clinic medially for evaluation of this.  A total of 25 minutes was used on this encounter with greater than 50% of it used for counseling her coordination of care.   Clayburn Pert, MD FACS General Surgeon  03/16/2017,10:42 AM

## 2017-03-18 ENCOUNTER — Telehealth: Payer: Self-pay | Admitting: Urology

## 2017-03-18 NOTE — Telephone Encounter (Signed)
I spoke with Chris Simon and she was asking why and if he still needed a KUB? The order that is in there has expired and if he does we will need a new order. If you will just let me know.  Thanks,  Sharyn Lull

## 2017-03-21 NOTE — Telephone Encounter (Signed)
Yes, ideally to monitor kidney stone.  If he doesn't want to get it, that's fine and we can discuss in the office.    Hollice Espy, MD

## 2017-03-22 ENCOUNTER — Telehealth: Payer: Self-pay

## 2017-03-22 NOTE — Telephone Encounter (Signed)
Anti-coagulant clearance faxed to [facility] at this time.

## 2017-03-22 NOTE — Telephone Encounter (Signed)
Cardiac Clearance faxed to Dr.Shaukat Khan at this time.  

## 2017-03-22 NOTE — Telephone Encounter (Signed)
Anti-coagulant clearance obtained at this time from Dr Neoma Laming and will be scanned under Media.

## 2017-03-22 NOTE — Telephone Encounter (Signed)
Cardiac Clearance obtained at this time from [facility] at this time and will be scanned under Media.

## 2017-03-28 ENCOUNTER — Telehealth: Payer: Self-pay

## 2017-03-28 NOTE — Telephone Encounter (Signed)
Neurology Clearance obtained from [facility] and will be scanned under Media.

## 2017-04-04 ENCOUNTER — Encounter
Admission: RE | Admit: 2017-04-04 | Discharge: 2017-04-04 | Disposition: A | Discharge status: Home / Self Care | Payer: Medicare HMO | Source: Ambulatory Visit | Attending: General Surgery | Admitting: General Surgery

## 2017-04-04 DIAGNOSIS — K4090 Unilateral inguinal hernia, without obstruction or gangrene, not specified as recurrent: Secondary | ICD-10-CM | POA: Diagnosis present

## 2017-04-04 DIAGNOSIS — I4891 Unspecified atrial fibrillation: Secondary | ICD-10-CM | POA: Diagnosis not present

## 2017-04-04 DIAGNOSIS — E785 Hyperlipidemia, unspecified: Secondary | ICD-10-CM | POA: Diagnosis not present

## 2017-04-04 DIAGNOSIS — Z7982 Long term (current) use of aspirin: Secondary | ICD-10-CM | POA: Diagnosis not present

## 2017-04-04 DIAGNOSIS — Z79899 Other long term (current) drug therapy: Secondary | ICD-10-CM | POA: Diagnosis not present

## 2017-04-04 DIAGNOSIS — Z888 Allergy status to other drugs, medicaments and biological substances status: Secondary | ICD-10-CM | POA: Diagnosis not present

## 2017-04-04 DIAGNOSIS — I714 Abdominal aortic aneurysm, without rupture: Secondary | ICD-10-CM | POA: Diagnosis not present

## 2017-04-04 DIAGNOSIS — D176 Benign lipomatous neoplasm of spermatic cord: Secondary | ICD-10-CM | POA: Diagnosis not present

## 2017-04-04 DIAGNOSIS — N401 Enlarged prostate with lower urinary tract symptoms: Secondary | ICD-10-CM | POA: Diagnosis not present

## 2017-04-04 DIAGNOSIS — J449 Chronic obstructive pulmonary disease, unspecified: Secondary | ICD-10-CM | POA: Diagnosis not present

## 2017-04-04 DIAGNOSIS — Z87442 Personal history of urinary calculi: Secondary | ICD-10-CM | POA: Diagnosis not present

## 2017-04-04 DIAGNOSIS — Z882 Allergy status to sulfonamides status: Secondary | ICD-10-CM | POA: Diagnosis not present

## 2017-04-04 DIAGNOSIS — K429 Umbilical hernia without obstruction or gangrene: Secondary | ICD-10-CM | POA: Diagnosis not present

## 2017-04-04 DIAGNOSIS — Z87891 Personal history of nicotine dependence: Secondary | ICD-10-CM | POA: Diagnosis not present

## 2017-04-04 DIAGNOSIS — Z7901 Long term (current) use of anticoagulants: Secondary | ICD-10-CM | POA: Diagnosis not present

## 2017-04-04 DIAGNOSIS — Z8673 Personal history of transient ischemic attack (TIA), and cerebral infarction without residual deficits: Secondary | ICD-10-CM | POA: Diagnosis not present

## 2017-04-04 HISTORY — DX: Diverticulosis of intestine, part unspecified, without perforation or abscess without bleeding: K57.90

## 2017-04-04 HISTORY — DX: Dyspnea, unspecified: R06.00

## 2017-04-04 HISTORY — DX: Hyperlipidemia, unspecified: E78.5

## 2017-04-04 HISTORY — DX: Chronic obstructive pulmonary disease, unspecified: J44.9

## 2017-04-04 HISTORY — DX: Unspecified dementia, unspecified severity, without behavioral disturbance, psychotic disturbance, mood disturbance, and anxiety: F03.90

## 2017-04-04 HISTORY — DX: Transient cerebral ischemic attack, unspecified: G45.9

## 2017-04-04 HISTORY — DX: Personal history of urinary calculi: Z87.442

## 2017-04-04 HISTORY — DX: Cardiac arrhythmia, unspecified: I49.9

## 2017-04-04 LAB — BASIC METABOLIC PANEL
Anion gap: 8 (ref 5–15)
BUN: 14 mg/dL (ref 6–20)
CHLORIDE: 103 mmol/L (ref 101–111)
CO2: 28 mmol/L (ref 22–32)
Calcium: 8.8 mg/dL — ABNORMAL LOW (ref 8.9–10.3)
Creatinine, Ser: 1.2 mg/dL (ref 0.61–1.24)
GFR, EST NON AFRICAN AMERICAN: 55 mL/min — AB (ref >60)
Glucose, Bld: 86 mg/dL (ref 65–99)
POTASSIUM: 3.9 mmol/L (ref 3.5–5.1)
SODIUM: 139 mmol/L (ref 135–145)

## 2017-04-04 LAB — CBC
HCT: 42.5 % (ref 40.0–52.0)
Hemoglobin: 14.2 g/dL (ref 13.0–18.0)
MCH: 31.7 pg (ref 26.0–34.0)
MCHC: 33.5 g/dL (ref 32.0–36.0)
MCV: 94.6 fL (ref 80.0–100.0)
PLATELETS: 186 10*3/uL (ref 150–440)
RBC: 4.49 MIL/uL (ref 4.40–5.90)
RDW: 13.6 % (ref 11.5–14.5)
WBC: 12.6 10*3/uL — AB (ref 3.8–10.6)

## 2017-04-04 LAB — DIFFERENTIAL
BASOS ABS: 0.1 10*3/uL (ref 0–0.1)
BASOS PCT: 1 %
Eosinophils Absolute: 0.1 10*3/uL (ref 0–0.7)
Eosinophils Relative: 1 %
LYMPHS PCT: 12 %
Lymphs Abs: 1.5 10*3/uL (ref 1.0–3.6)
Monocytes Absolute: 1.4 10*3/uL — ABNORMAL HIGH (ref 0.2–1.0)
Monocytes Relative: 12 %
NEUTROS PCT: 76 %
Neutro Abs: 9.6 10*3/uL — ABNORMAL HIGH (ref 1.4–6.5)

## 2017-04-04 NOTE — Pre-Procedure Instructions (Signed)
Dr. Adonis Huguenin office faxed and called medical clearance request (spoke to [employer] and [place]. )

## 2017-04-04 NOTE — Pre-Procedure Instructions (Signed)
Dr. Ronelle Nigh notified of anesthesia consult and patient medical history and requested medical clearance due to recent TIA.

## 2017-04-04 NOTE — Patient Instructions (Signed)
Your procedure is scheduled on: April 07, 2017 (THURSDAY) Report to Same Day Surgery 2nd floor medical mall ([facility] Entr[facility]-take elevator on left to 2nd floor.  Check in with surgery information desk.) To find out your arrival time please call [phone] between 1PM - 3PM on April 06, 2017 [facility])  Remember: Instructions that are not followed completely may result in serious medical risk, up to and including death, or upon the discretion of your surgeon and anesthesiologist your surgery may need to be rescheduled.    _x___ 1. Do not eat food or drink liquids after midnight. No gum chewing or hard candies                              __x__ 2. No Alcohol for 24 hours before or after surgery.   __x__3. No Smoking for 24 prior to surgery.   ____  4. Bring all medications with you on the day of surgery if instructed.    __x__ 5. Notify your doctor if there is any change in your medical condition     (cold, fever, infections).     Do not wear jewelry, make-up, hairpins, clips or nail polish.  Do not wear lotions, powders, or perfumes. You may wear deodorant.  Do not shave 48 hours prior to surgery. Men may shave face and neck.  Do not bring valuables to the hospital.    [facility] is not responsible for any belongings or valuables.               Contacts, dentures or bridgework may not be worn into surgery.  Leave your suitcase in the car. After surgery it may be brought to your room.  For patients admitted to the hospital, discharge time is determined by your treatment team                        Patients discharged the day of surgery will not be allowed to drive home.  You will need someone to drive you home and stay with you the night of your procedure.    Please read over the following fact sheets that you were given:   [facility] Preparing for Surgery and or MRSA Information   ___ Take anti-hypertensive (unless it includes a diuretic), cardiac, seizure, asthma,      anti-reflux and psychiatric medicines. These include:  1.   2.  3.  4.  5.  6.  ____Fleets enema or Magnesium Citrate as directed.   _x___ Use CHG Soap or sage wipes as directed on instruction sheet   _x___ Use inhalers on the day of surgery and bring to hospital day of surgery (USE ALBUTEROL INHALER THE MORNING OF SURGERY AND [facility] )  ____ Stop Metformin and Janumet 2 days prior to surgery.    ____ Take 1/2 of usual insulin dose the night before surgery and none on the morning surgery      _x___ Follow recommendations from Cardiologist, Pulmonologist or PCP regarding          stopping Aspirin, Coumadin, Plavix ,Eliquis, Effient, or Pradaxa, and Pletal. (STOP ELIQUIS AND ASPIRIN AS INSTRUCTED BY DR [facility] OFFICE )  X____Stop Anti-inflammatories such as Advil, Aleve, Ibuprofen, Motrin, Naproxen, Naprosyn, Goodies powders or aspirin products. OK to take Tylenol    _x___ Stop supplements until after surgery.  But may continue Vitamin D, Vitamin B,and multivitamin (STOP  CRANBERRY NOW )         ____ Bring C-Pap to the hospital.

## 2017-04-06 MED ORDER — CEFAZOLIN SODIUM-DEXTROSE 2-4 GM/100ML-% IV SOLN
2.0000 g | INTRAVENOUS | Status: AC
  Administered 2017-04-07: 2 g via INTRAVENOUS

## 2017-04-06 NOTE — Pre-Procedure Instructions (Signed)
CLEARED BY NEURO 03/28/17 AND CARDIAC 03/22/17 UNDER CHART REVIEW MEDIA

## 2017-04-07 ENCOUNTER — Ambulatory Visit
Admission: RE | Admit: 2017-04-07 | Discharge: 2017-04-07 | Disposition: A | Discharge status: Home / Self Care | Payer: Medicare HMO | Source: Ambulatory Visit | Attending: General Surgery | Admitting: General Surgery

## 2017-04-07 ENCOUNTER — Ambulatory Visit: Payer: Medicare HMO | Admitting: Certified Registered"

## 2017-04-07 ENCOUNTER — Encounter
Admission: RE | Disposition: A | Discharge status: Home / Self Care | Payer: Self-pay | Source: Ambulatory Visit | Attending: General Surgery

## 2017-04-07 ENCOUNTER — Telehealth: Payer: Self-pay

## 2017-04-07 ENCOUNTER — Encounter: Payer: Self-pay | Admitting: *Deleted

## 2017-04-07 DIAGNOSIS — Z87442 Personal history of urinary calculi: Secondary | ICD-10-CM | POA: Insufficient documentation

## 2017-04-07 DIAGNOSIS — Z888 Allergy status to other drugs, medicaments and biological substances status: Secondary | ICD-10-CM | POA: Insufficient documentation

## 2017-04-07 DIAGNOSIS — K4090 Unilateral inguinal hernia, without obstruction or gangrene, not specified as recurrent: Secondary | ICD-10-CM | POA: Insufficient documentation

## 2017-04-07 DIAGNOSIS — J449 Chronic obstructive pulmonary disease, unspecified: Secondary | ICD-10-CM | POA: Insufficient documentation

## 2017-04-07 DIAGNOSIS — E785 Hyperlipidemia, unspecified: Secondary | ICD-10-CM | POA: Insufficient documentation

## 2017-04-07 DIAGNOSIS — K429 Umbilical hernia without obstruction or gangrene: Secondary | ICD-10-CM | POA: Insufficient documentation

## 2017-04-07 DIAGNOSIS — Z7982 Long term (current) use of aspirin: Secondary | ICD-10-CM | POA: Insufficient documentation

## 2017-04-07 DIAGNOSIS — I4891 Unspecified atrial fibrillation: Secondary | ICD-10-CM | POA: Insufficient documentation

## 2017-04-07 DIAGNOSIS — D176 Benign lipomatous neoplasm of spermatic cord: Secondary | ICD-10-CM | POA: Insufficient documentation

## 2017-04-07 DIAGNOSIS — Z79899 Other long term (current) drug therapy: Secondary | ICD-10-CM | POA: Insufficient documentation

## 2017-04-07 DIAGNOSIS — Z7901 Long term (current) use of anticoagulants: Secondary | ICD-10-CM | POA: Insufficient documentation

## 2017-04-07 DIAGNOSIS — Z882 Allergy status to sulfonamides status: Secondary | ICD-10-CM | POA: Insufficient documentation

## 2017-04-07 DIAGNOSIS — Z8673 Personal history of transient ischemic attack (TIA), and cerebral infarction without residual deficits: Secondary | ICD-10-CM | POA: Insufficient documentation

## 2017-04-07 DIAGNOSIS — N401 Enlarged prostate with lower urinary tract symptoms: Secondary | ICD-10-CM | POA: Insufficient documentation

## 2017-04-07 DIAGNOSIS — Z87891 Personal history of nicotine dependence: Secondary | ICD-10-CM | POA: Insufficient documentation

## 2017-04-07 DIAGNOSIS — I714 Abdominal aortic aneurysm, without rupture: Secondary | ICD-10-CM | POA: Insufficient documentation

## 2017-04-07 HISTORY — PX: INSERTION OF MESH: SHX5868

## 2017-04-07 HISTORY — PX: INGUINAL HERNIA REPAIR: SHX194

## 2017-04-07 SURGERY — REPAIR, HERNIA, INGUINAL, ADULT
Anesthesia: General | Site: Inguinal | Laterality: Left | Wound class: Clean

## 2017-04-07 MED ORDER — FAMOTIDINE 20 MG PO TABS
ORAL_TABLET | ORAL | Status: AC
  Administered 2017-04-07: 20 mg via ORAL
  Filled 2017-04-07: qty 1

## 2017-04-07 MED ORDER — CHLORHEXIDINE GLUCONATE CLOTH 2 % EX PADS
6.0000 | MEDICATED_PAD | Freq: Once | CUTANEOUS | Status: AC
  Administered 2017-04-07: 6 via TOPICAL

## 2017-04-07 MED ORDER — DEXAMETHASONE SODIUM PHOSPHATE 10 MG/ML IJ SOLN
INTRAMUSCULAR | Status: DC | PRN
  Administered 2017-04-07: 4 mg via INTRAVENOUS

## 2017-04-07 MED ORDER — ONDANSETRON HCL 4 MG/2ML IJ SOLN: 4.0000 mg | Freq: Once | INTRAMUSCULAR | Status: DC | PRN

## 2017-04-07 MED ORDER — PROPOFOL 10 MG/ML IV BOLUS
INTRAVENOUS | Status: DC | PRN
  Administered 2017-04-07: 110 mg via INTRAVENOUS
  Administered 2017-04-07: 20 mg via INTRAVENOUS

## 2017-04-07 MED ORDER — ONDANSETRON HCL 4 MG/2ML IJ SOLN
INTRAMUSCULAR | Status: DC | PRN
  Administered 2017-04-07: 4 mg via INTRAVENOUS

## 2017-04-07 MED ORDER — SODIUM CHLORIDE 0.9 % IJ SOLN
INTRAMUSCULAR | Status: AC
  Filled 2017-04-07: qty 50

## 2017-04-07 MED ORDER — FAMOTIDINE 20 MG PO TABS
20.0000 mg | ORAL_TABLET | Freq: Once | ORAL | Status: AC
  Administered 2017-04-07: 20 mg via ORAL

## 2017-04-07 MED ORDER — LIDOCAINE HCL 1 % IJ SOLN
INTRAMUSCULAR | Status: DC | PRN
  Administered 2017-04-07: 10 mL

## 2017-04-07 MED ORDER — FENTANYL CITRATE (PF) 100 MCG/2ML IJ SOLN: 25.0000 ug | INTRAMUSCULAR | Status: DC | PRN

## 2017-04-07 MED ORDER — LIDOCAINE HCL (CARDIAC) 20 MG/ML IV SOLN
INTRAVENOUS | Status: DC | PRN
  Administered 2017-04-07: 80 mg via INTRAVENOUS

## 2017-04-07 MED ORDER — HYDROCODONE-ACETAMINOPHEN 5-325 MG PO TABS: 1.0000 | ORAL_TABLET | Freq: Four times a day (QID) | ORAL | 0 refills | Status: DC | PRN

## 2017-04-07 MED ORDER — FENTANYL CITRATE (PF) 100 MCG/2ML IJ SOLN
INTRAMUSCULAR | Status: DC | PRN
  Administered 2017-04-07 (×2): 50 ug via INTRAVENOUS
  Administered 2017-04-07: 25 ug via INTRAVENOUS

## 2017-04-07 MED ORDER — ACETAMINOPHEN 10 MG/ML IV SOLN
INTRAVENOUS | Status: DC | PRN
  Administered 2017-04-07: 1000 mg via INTRAVENOUS

## 2017-04-07 MED ORDER — SODIUM CHLORIDE 0.9 % IV SOLN
INTRAVENOUS | Status: DC | PRN
  Administered 2017-04-07: 40 mL

## 2017-04-07 MED ORDER — CEFAZOLIN SODIUM-DEXTROSE 2-4 GM/100ML-% IV SOLN
INTRAVENOUS | Status: AC
  Filled 2017-04-07: qty 100

## 2017-04-07 MED ORDER — LACTATED RINGERS IV SOLN
INTRAVENOUS | Status: DC
  Administered 2017-04-07: 09:00:00 via INTRAVENOUS

## 2017-04-07 SURGICAL SUPPLY — 31 items
BLADE SURG 15 STRL LF DISP TIS (BLADE) ×1 IMPLANT
BLADE SURG 15 STRL SS (BLADE) ×2
CHLORAPREP W/TINT 26ML (MISCELLANEOUS) ×3 IMPLANT
DERMABOND ADVANCED (GAUZE/BANDAGES/DRESSINGS) ×2
DERMABOND ADVANCED .7 DNX12 (GAUZE/BANDAGES/DRESSINGS) ×1 IMPLANT
DRAIN PENROSE 1/4X12 LTX (DRAIN) ×3 IMPLANT
DRAPE LAPAROTOMY 100X77 ABD (DRAPES) ×3 IMPLANT
DRAPE SHEET LG 3/4 BI-LAMINATE (DRAPES) ×3 IMPLANT
ELECT CAUTERY BLADE 6.4 (BLADE) ×3 IMPLANT
ELECT REM PT RETURN 9FT ADLT (ELECTROSURGICAL) ×3
ELECTRODE REM PT RTRN 9FT ADLT (ELECTROSURGICAL) ×1 IMPLANT
GLOVE BIO SURGEON STRL SZ7.5 (GLOVE) ×6 IMPLANT
GLOVE INDICATOR 8.0 STRL GRN (GLOVE) ×6 IMPLANT
GOWN STRL REUS W/ TWL LRG LVL3 (GOWN DISPOSABLE) ×2 IMPLANT
GOWN STRL REUS W/TWL LRG LVL3 (GOWN DISPOSABLE) ×4
KIT RM TURNOVER STRD PROC AR (KITS) ×3 IMPLANT
LABEL OR SOLS (LABEL) IMPLANT
MESH PARIETEX PROGRIP LEFT (Mesh General) ×3 IMPLANT
NDL SAFETY 25GX1.5 (NEEDLE) ×6 IMPLANT
NS IRRIG 500ML POUR BTL (IV SOLUTION) ×3 IMPLANT
PACK BASIN MINOR ARMC (MISCELLANEOUS) ×3 IMPLANT
SUT ETHIBOND 0 MO6 C/R (SUTURE) ×3 IMPLANT
SUT MNCRL 4-0 (SUTURE) ×2
SUT MNCRL 4-0 27XMFL (SUTURE) ×1
SUT SILK 2 0 SH (SUTURE) ×3 IMPLANT
SUT VIC AB 2-0 CT2 27 (SUTURE) ×6 IMPLANT
SUT VIC AB 3-0 SH 27 (SUTURE) ×2
SUT VIC AB 3-0 SH 27X BRD (SUTURE) ×1 IMPLANT
SUTURE MNCRL 4-0 27XMF (SUTURE) ×1 IMPLANT
SYR 20CC LL (SYRINGE) ×3 IMPLANT
SYRINGE 10CC LL (SYRINGE) ×3 IMPLANT

## 2017-04-07 NOTE — Telephone Encounter (Signed)
Medical Clearance obtained from Dr.Feldpaush at this time.

## 2017-04-07 NOTE — Anesthesia Procedure Notes (Signed)
Procedure Name: LMA Insertion Performed by: Lance Muss Pre-anesthesia Checklist: Patient identified, Patient being monitored, Timeout performed, Emergency Drugs available and Suction available Patient Re-evaluated:Patient Re-evaluated prior to induction Oxygen Delivery Method: Circle system utilized Preoxygenation: Pre-oxygenation with 100% oxygen Induction Type: IV induction Ventilation: Mask ventilation without difficulty LMA: LMA inserted LMA Size: 5.0 Tube type: Oral Number of attempts: 2 Placement Confirmation: positive ETCO2 and breath sounds checked- equal and bilateral Tube secured with: Tape Dental Injury: Teeth and Oropharynx as per pre-operative assessment  Comments: Attempted first with 4.5 LMA, audible leak noted. Removed and replaced with #5 LMA, +BBS, +ETCO2.

## 2017-04-07 NOTE — Telephone Encounter (Signed)
Received anti-coagulant from [place] at this time. Patient to stop Eliquis 3 days prior to surgery.

## 2017-04-07 NOTE — Interval H&P Note (Signed)
History and Physical Interval Note:  04/07/2017 9:14 AM  [name]  has presented today for surgery, with the diagnosis of INGUINAL HERNIA  The various methods of treatment have been discussed with the patient and family. After consideration of risks, benefits and other options for treatment, the patient has consented to  Procedure(s): HERNIA REPAIR INGUINAL ADULT (Left) as a surgical intervention .  The patient's history has been reviewed, patient examined, no change in status, stable for surgery.  I have reviewed the patient's chart and labs.  Questions were answered to the patient's satisfaction.     Clayburn Pert

## 2017-04-07 NOTE — Transfer of Care (Signed)
Immediate Anesthesia Transfer of Care Note  Patient: Chris Simon  Procedure(s) Performed: Procedure(s): HERNIA REPAIR INGUINAL ADULT (Left) INSERTION OF MESH (Left)  Patient Location: PACU  Anesthesia Type:General  Level of Consciousness: sedated and responds to stimulation  Airway & Oxygen Therapy: Patient Spontanous Breathing and Patient connected to face mask oxygen  Post-op Assessment: Report given to RN and Post -op Vital signs reviewed and stable  Post vital signs: Reviewed and stable  Last Vitals:  Vitals:   04/07/17 1105 04/07/17 1106  BP: 131/87 131/87  Pulse: 87 97  Resp: (!) 8 14  Temp: 36.8 C     Last Pain:  Vitals:   04/07/17 0805  TempSrc: Oral         Complications: No apparent anesthesia complications

## 2017-04-07 NOTE — Discharge Instructions (Signed)
PATIENT INSTRUCTIONS HERNIA  FOLLOW-UP:  Please make an appointment with your physician in 2 week(s).  Call your physician immediately if you have any fevers greater than 102.5, drainage from you wound that is not clear or looks infected, persistent bleeding, increasing abdominal pain, problems urinating, or persistent nausea/vomiting.    WOUND CARE INSTRUCTIONS:  Keep a dry clean dressing on the wound if there is drainage. If clothing rubs against the wound or causes irritation and the wound is not draining you may cover it with a dry dressing during the daytime.  Try to keep the wound dry and avoid ointments on the wound unless directed to do so.  If the wound becomes bright red and painful or starts to drain infected material that is not clear, please contact your physician immediately.  If the wound is mildly pink and has a thick firm ridge underneath it, this is normal, and is referred to as a healing ridge.  This will resolve over the next 4-6 weeks.  DIET:  You may eat any foods that you can tolerate.  It is a good idea to eat a high fiber diet and take in plenty of fluids to prevent constipation.  If you do become constipated you may want to take a mild laxative or take ducolax tablets on a daily basis until your bowel habits are regular.  Constipation can be very uncomfortable, along with straining, after recent abdominal surgery.  ACTIVITY:  You are encouraged to cough and deep breath or use your incentive spirometer if you were given one, every 15-30 minutes when awake.  This will help prevent respiratory complications and low grade fevers post-operatively.  You may want to hug a pillow when coughing and sneezing to add additional support to the surgical area which will decrease pain during these times.  You are encouraged to walk and engage in light activity for the next two weeks.  You should not lift more than 20 pounds during this time frame as it could put you at increased risk for a hernia  recurrence.  Twenty pounds is roughly equivalent to a plastic bag of groceries. Okay to take showers and 24 hours. Do not submerge (bath tub, hot tub, swimming pool) for at least 2 weeks.   MEDICATIONS:  Try to take narcotic medications and anti-inflammatory medications, such as tylenol, ibuprofen, naprosyn, etc., with food.  This will minimize stomach upset from the medication.  Should you develop nausea and vomiting from the pain medication, or develop a rash, please discontinue the medication and contact your physician.  You should not drive, make important decisions, or operate machinery when taking narcotic pain medication.  QUESTIONS:  Please feel free to call your physician or the hospital operator if you have any questions, and they will be glad to assist you.   AMBULATORY SURGERY  DISCHARGE INSTRUCTIONS   1) The drugs that you were given will stay in your system until tomorrow so for the next 24 hours you should not:  A) Drive an automobile B) Make any legal decisions C) Drink any alcoholic beverage   2) You may resume regular meals tomorrow.  Today it is better to start with liquids and gradually work up to solid foods.  You may eat anything you prefer, but it is better to start with liquids, then soup and crackers, and gradually work up to solid foods.   3) Please notify your doctor immediately if you have any unusual bleeding, trouble breathing, redness and pain at the  surgery site, drainage, fever, or pain not relieved by medication.    4) Additional Instructions:        Please contact your physician with any problems or Same Day Surgery at [phone], Monday through Friday 6 am to 4 pm, or [place] at [facility] number at [phone].

## 2017-04-07 NOTE — Anesthesia Preprocedure Evaluation (Signed)
Anesthesia Evaluation  Patient identified by MRN, date of birth, ID band Patient awake    Reviewed: Allergy & Precautions, H&P , NPO status , Patient's Chart, lab work & pertinent test results, reviewed documented beta blocker date and time   Airway Mallampati: I  TM Distance: >3 FB Neck ROM: full    Dental  (+) Edentulous Upper, Upper Dentures, Missing, Dental Advidsory Given   Pulmonary shortness of breath and with exertion, neg sleep apnea, COPD, neg recent URI, former smoker,           Cardiovascular Exercise Tolerance: Good (-) hypertension(-) angina(-) CAD, (-) Past MI, (-) Cardiac Stents and (-) CABG + dysrhythmias Atrial Fibrillation (-) Valvular Problems/Murmurs     Neuro/Psych neg Seizures PSYCHIATRIC DISORDERS (Dementia) TIA   GI/Hepatic negative GI ROS, Neg liver ROS,   Endo/Other  negative endocrine ROS  Renal/GU Renal disease (kidney stones)  negative genitourinary   Musculoskeletal   Abdominal   Peds  Hematology negative hematology ROS (+)   Anesthesia Other Findings Past Medical History: No date: Aneurysm (HCC)     Comment:  Abdominal Aortic Aneurysm No date: Atrial fibrillation (HCC) No date: Blockage of a bile duct No date: BPH with obstruction/lower urinary tract symptoms No date: Cellulitis of scrotum No date: COPD (chronic obstructive pulmonary disease) (HCC) No date: Dementia No date: Diverticulosis No date: Dyspnea     Comment:  with exertion No date: Dysrhythmia No date: Elevated PSA No date: Epididymitis No date: Gallstone No date: Gross hematuria No date: History of kidney stones No date: Hyperlipidemia No date: Kidney stones No date: Nocturia No date: Testicular pain No date: TIA (transient ischemic attack)     Comment:  June 2018 No date: Urinary retention   Reproductive/Obstetrics negative OB ROS                             Anesthesia  Physical Anesthesia Plan  ASA: III  Anesthesia Plan: General   Post-op Pain Management:    Induction: Intravenous  PONV Risk Score and Plan: 2 and Ondansetron and Dexamethasone  Airway Management Planned: LMA  Additional Equipment:   Intra-op Plan:   Post-operative Plan: Extubation in OR  Informed Consent: I have reviewed the patients History and Physical, chart, labs and discussed the procedure including the risks, benefits and alternatives for the proposed anesthesia with the patient or authorized representative who has indicated his/her understanding and acceptance.   Dental Advisory Given  Plan Discussed with: Anesthesiologist, CRNA and Surgeon  Anesthesia Plan Comments:         Anesthesia Quick Evaluation

## 2017-04-07 NOTE — Anesthesia Post-op Follow-up Note (Cosign Needed)
Anesthesia QCDR form completed.        

## 2017-04-07 NOTE — H&P (View-Only) (Signed)
Outpatient Surgical Follow Up  03/16/2017  Chris Simon is an 83 y.o. male.   Chief Complaint  Patient presents with  . Other    last seen 9/17 by Dr Dillon Bjork inguinal hernia and unbilical hernia    HPI: 83 year old male returns to clinic today to discuss left inguinal hernia repair. Patient will not complain of pain or discomfort to the area. His daughter times in that he has been having more significant pain since April. He was evaluated by his primary care provider and it was noted to be larger than it was last fall. He states that he occasionally has to push the area and will cause discomfort on occasion but not all the time. He also has an umbilical hernia he states that has never bothered him and has not changed in size over the last several years. He is eating well and denies any fevers, chills, nausea, vomiting, chest pain, shortness of breath, diarrhea, constipation. He does have a chronic cough per his daughter. He was admitted to the hospital last month with pneumonia and a TIA.  Past Medical History:  Diagnosis Date  . Aneurysm ([place])   . Atrial fibrillation ([place])   . Blockage of a bile duct   . BPH with obstruction/lower urinary tract symptoms   . Cellulitis of scrotum   . Elevated PSA   . Epididymitis   . Gallstone   . Gross hematuria   . Kidney stones   . Nocturia   . Testicular pain   . Urinary retention     Past Surgical History:  Procedure Laterality Date  . CHOLECYSTECTOMY N/A 07/11/2015   Procedure: LAPAROSCOPIC CHOLECYSTECTOMY;  Surgeon: Rolm Bookbinder, MD;  Location: [place];  Service: General;  Laterality: N/A;  . ERCP N/A 07/09/2015   Procedure: ENDOSCOPIC RETROGRADE CHOLANGIOPANCREATOGRAPHY (ERCP);  Surgeon: Clarene Essex, MD;  Location: [facility] ENDOSCOPY;  Service: Endoscopy;  Laterality: N/A;    Family History  Problem Relation Age of Onset  . Prostate cancer Brother   . Kidney disease Neg Hx   . Bladder Cancer Neg Hx     Social History:   reports that he has quit smoking. He has quit using smokeless tobacco. He reports that he does not drink alcohol or use drugs.  Allergies:  Allergies  Allergen Reactions  . Clindamycin Hcl     Other reaction(s): Unknown Reaction:  Unknown   . Clindamycin/Lincomycin Other (See Comments)    Reaction:  Unknown   . Sulfamethoxazole-Trimethoprim Rash    Medications reviewed.    ROS A multipoint review of systems was completed. All pertinent positives and negatives are documented within the history of present illness and the remainder are negative   BP 123/75   Pulse (!) 56   Temp 98.2 F (36.8 C) (Oral)   Ht 6' (1.829 m)   Wt 83.9 kg (185 lb)   BMI 25.09 kg/m   Physical Exam Gen.: No acute distress Neck: Supple and nontender Lymph nodes: No evidence of cervical, clavicular, axillary lymphadenopathy Chest: Clear to auscultation Heart: Regular rhythm Abdomen: Soft, nontender, nondistended. Small and easily reducible umbilical hernia that is nontender. Moderate sized left inguinal hernia that is easily reducible and mildly tender on palpation. No evidence of right inguinal hernia. Extremities: Moves all extremities well.    No results found for this or any previous visit (from the past 48 hour(s)). No results found.  Assessment/Plan:  1. Left inguinal hernia 83 year old male with a symptomatic left inguinal hernia that is increasing in  size over the last year. Had a long conversation with patient about the risks of hernias and he states that he is willing to have it repaired. Discussed an open inguinal hernia repair in detail to include the risks, benefits, alternatives. The primary risks are pain, bleeding, infection, recurrence. Also discussed the possibility of removing a nerve were to be involved with the mesh that is used. Patient voiced understanding and desires to proceed. We will plan for repair on July 12. We will obtain medical and neurology clearances prior to  proceeding with operative room. He has appointments with both of those providers next week. Patient understands that he is a higher risk surgery patient secondary to his recent pneumonia and TIA.  2. Umbilical hernia without obstruction and without gangrene Patient was in a symptomatic umbilical hernia. Discussed the signs and symptoms of worsening in size or symptoms and return to clinic medially for evaluation of this.  A total of 25 minutes was used on this encounter with greater than 50% of it used for counseling her coordination of care.   Clayburn Pert, MD FACS General Surgeon  03/16/2017,10:42 AM

## 2017-04-07 NOTE — Brief Op Note (Signed)
04/07/2017  10:58 AM  PATIENT:  Chris Simon  83 y.o. male  PRE-OPERATIVE DIAGNOSIS:  INGUINAL HERNIA LEFT  POST-OPERATIVE DIAGNOSIS:  INGUINAL HERNIA LEFT  PROCEDURE:  Procedure(s): HERNIA REPAIR INGUINAL ADULT (Left) INSERTION OF MESH (Left)  SURGEON:  Surgeon(s) and Role:    * Clayburn Pert, MD - Primary  PHYSICIAN ASSISTANT:   ASSISTANTS: none   ANESTHESIA:   general  EBL:  Total I/O In: -  Out: 4 [Blood:4]  BLOOD ADMINISTERED:none  DRAINS: none   LOCAL MEDICATIONS USED:  LIDOCAINE  and OTHER exparel  SPECIMEN:  Source of Specimen:  cord lipoma  DISPOSITION OF SPECIMEN:  PATHOLOGY  COUNTS:  YES  TOURNIQUET:  * No tourniquets in log *  DICTATION: .Dragon Dictation  PLAN OF CARE: Discharge to home after PACU  PATIENT DISPOSITION:  PACU - hemodynamically stable.   Delay start of Pharmacological VTE agent (>24hrs) due to surgical blood loss or risk of bleeding: not applicable

## 2017-04-07 NOTE — Op Note (Signed)
Pre-operative Diagnosis: Left inguinal hernia  Post-operative Diagnosis: Indirect left inguinal hernia with cord lipoma  Procedure performed: Open left inguinal hernia repair  Surgeon: Clayburn Pert   Assistants: None  Anesthesia: General LMA anesthesia  ASA Class: 2  Surgeon: Clayburn Pert, MD FACS  Anesthesia: Gen. with endotracheal tube  Assistant: None  Procedure Details  The patient was seen again in the Holding Room. The benefits, complications, treatment options, and expected outcomes were discussed with the patient. The risks of bleeding, infection, recurrence of symptoms, failure to resolve symptoms,  bowel injury, any of which could require further surgery were reviewed with the patient.   The patient was taken to Operating Room, identified as Chris Simon and the procedure verified.  A Time Out was held and the above information confirmed.  Prior to the induction of general anesthesia, antibiotic prophylaxis was administered. VTE prophylaxis was in place. General endotracheal anesthesia was then administered and tolerated well. After the induction, the abdomen was prepped with Chloraprep and draped in the sterile fashion. The patient was positioned in the supine position.  The procedure began by marking the pubic tubercle and anterior superior iliac spine on the left lower quadrant. A surgical site was implanted. It was localized with 1% lidocaine solution. Skin was incised with a 10 blade scalpel and using Bovie much cautery was taken of the level of the external oblique fascia. The fascia was cleared off until the external opening to the inguinal canal was identified at which point the fascia was opened in the direction of fibers going to the external opening. This was done sharply with a 15 blade scalpel followed by Metzenbaum scissors.  The spermatic cord was then isolated and elevated with a Penrose drain. This allowed inflation of both a direct and indirect  space. The direct space was noted to be lax but without any obvious hernia sac. There is an obvious bulge coming from the indirect space. The cord structures were meticulously dissected out and isolated. During this a large cord lipoma was identified and removed with electrocautery. This was passed off the field specimen. Additional tissue is identified at the opening which was found to be an indirect hernia sac. This was dissected free from the cord structures and able be reduced with ease.  Due to the large size of the indirect space opening it was loosely reapproximated with a figure-of-eight 2-0 Ethibond suture to help hold the hernia sac within the abdomen. The direct space was then reapproximated with interrupted Ethibond sutures as well in a modified McVay fashion. With the hernia sac fully reduced in the direct space reapproximated a left-sided progrip mesh was brought to the field, cut to the appropriate size and placed around the spermatic cord. It was made to lay flat with ease covered with the direct and indirect spaces. It was then secured to the pubic tubercle and the conjoined tendon with interrupted 2-0 Vicryl suture.  The entire operative field was then irrigated and meticulous hemostasis was ensured. Liposomal bupivacaine was then injected in all directions taking care to inject at our suture sites as well as the site for which the inguinal nerve should arise that was not visualized during the case. After localization the external oblique fascia was reapproximated with a running 2-0 Vicryl suture. The Scarpa's fascia was reapproximated with an interrupted 2-0 Vicryl suture. The deep dermal tissues were reapproximated with an interrupted 3-0 Vicryl suture. The skin was reapproximated with a running subcuticular 4-0 Monocryl and then sealed  with Dermabond. Bilateral testicles were confirmed within the scrotum prior to the patient awakening from general anesthesia. All counts are correct at the  end of the procedure and there are no immediate complications. Patient was awoken from anesthesia and transferred to the PACU in good condition.  Findings: Large indirect left inguinal hernia and cord lipoma   Estimated Blood Loss: 10 mL         Drains: None         Specimens: Cord lipoma          Complications: None                  Condition: Good   Clayburn Pert, MD, FACS

## 2017-04-08 ENCOUNTER — Telehealth: Payer: Self-pay

## 2017-04-08 LAB — SURGICAL PATHOLOGY

## 2017-04-08 NOTE — Telephone Encounter (Signed)
Post-op call made to patient at this time. Spoke with Veleta Miners ,patient's daughter. Post-op interview questions below.  1. How are you feeling? Feeling fine at this time  2. Is your pain controlled? Yes  3. What are you doing for the pain? Took one pain pill last night  4. Are you having any Nausea or Vomiting? None  5. Are you having any Fever or Chills? None  6. Are you having any Constipation or Diarrhea? None  7. Is there any Swelling or Bruising you are concerned about? None  8. Do you have any questions or concerns at this time? None   Discussion: Reminded patient's daughter Veleta Miners) of his follow up appointment on 7/25 at 9:30AM with Dr. Adonis Huguenin

## 2017-04-08 NOTE — Anesthesia Postprocedure Evaluation (Signed)
Anesthesia Post Note  Patient: Chris Simon  Procedure(s) Performed: Procedure(s) (LRB): HERNIA REPAIR INGUINAL ADULT (Left) INSERTION OF MESH (Left)  Patient location during evaluation: PACU Anesthesia Type: General Level of consciousness: awake and alert Pain management: pain level controlled Vital Signs Assessment: post-procedure vital signs reviewed and stable Respiratory status: spontaneous breathing, nonlabored ventilation, respiratory function stable and patient connected to nasal cannula oxygen Cardiovascular status: blood pressure returned to baseline and stable Postop Assessment: no signs of nausea or vomiting Anesthetic complications: no     Last Vitals:  Vitals:   04/07/17 1235 04/07/17 1315  BP: 122/69 119/69  Pulse: 75 79  Resp: 14 14  Temp: (!) 36.4 C     Last Pain:  Vitals:   04/07/17 1235  TempSrc:   PainSc: 0-No pain                 Martha Clan

## 2017-04-12 ENCOUNTER — Telehealth: Payer: Self-pay

## 2017-04-12 NOTE — Telephone Encounter (Signed)
Patient's daughter sent picture of her fathers wound. Looks normal per [employer]. Cancelled appointment for tomorrow that was made prior to seeing picture.  Was instructed to keep appointment 04/20/17, but if redness, fever, or drainage occurs to call office and let us know.

## 2017-04-12 NOTE — Telephone Encounter (Signed)
Patients daughter called in stating that her father mentioned to her yesterday that he is having some pain at his hernia repair site. She stated that the area is swollen and protruding out to the point that you can see it through his clothes. She said that the area was hard. She stated that it was really red yesterday and that today the redness has went away some but still there. She stated that he has not had any diarrhea or constipation that she knows of and that he has not had any nausea or vomiting. No fever or chills at the time. She wanted to know if he could be seen today by a provider.    Call made to patients daughter at this time. I informed her that I spoke with a nurse and that she asked for her to send a picture of the area he's complaining about. I also asked if he had any drainage or if the incision had opened and she stated that none of those things were happening. I told her that he can apply ice or heat to the area depending on his preference. I told her that the nurse will contact her after seeing the picture if she feels that he needs to be seen sooner than the appointment that I made for him tomorrow. Patient verbalized understanding.

## 2017-04-13 ENCOUNTER — Ambulatory Visit: Payer: Self-pay | Admitting: Surgery

## 2017-04-20 ENCOUNTER — Ambulatory Visit (INDEPENDENT_AMBULATORY_CARE_PROVIDER_SITE_OTHER): Payer: Medicare HMO | Admitting: General Surgery

## 2017-04-20 ENCOUNTER — Encounter: Payer: Self-pay | Admitting: General Surgery

## 2017-04-20 VITALS — BP 142/76 | HR 51 | Temp 97.9°F | Wt 167.0 lb

## 2017-04-20 DIAGNOSIS — Z4889 Encounter for other specified surgical aftercare: Secondary | ICD-10-CM

## 2017-04-20 NOTE — Patient Instructions (Addendum)
Please do not submerge in a tub, hot tub, or pool until incisions are completely sealed.  Use sun block to incision area over the next year if this area will be exposed to sun. This helps decrease scarring.  You may resume your normal activities. At this time- Listen to your body when lifting, if you have pain when lifting, stop and then try again in a few days. Soreness after doing exercises or activities of daily living is normal as you get back in to your normal routine.  If you develop redness, drainage, or pain at incision sites- call our office immediately and speak with a nurse.  Please call our office with any questions or concerns that you may have.  You are able to apply a heating pad on your Inguinal area to help with the swelling.  If in 3 weeks, you are still having pain then please give [place] a call to see you again to make sure everything is okay.

## 2017-04-20 NOTE — Progress Notes (Signed)
Outpatient Surgical Follow Up  04/20/2017  Chris Simon is an 83 y.o. male.   Chief Complaint  Patient presents with  . Routine Post Op    Inguinal hernia repair 04/06/17 Dr. Adonis Huguenin    HPI: 83 year old male returns to clinic 2 weeks status post open left inguinal hernia repair. Patient reports the area is mostly numb and feels good. Denies any fevers, chills, nausea, vomiting, chest pain, shortness of breath, diarrhea, constipation. He took approximately 3 pills since being discharged from the hospital he has been doing well. His daughter states she was concerned about swelling to the area but denies any redness or drainage.  Past Medical History:  Diagnosis Date  . Aneurysm ([place])    Abdominal Aortic Aneurysm  . Atrial fibrillation ([place])   . Blockage of a bile duct   . BPH with obstruction/lower urinary tract symptoms   . Cellulitis of scrotum   . COPD (chronic obstructive pulmonary disease) ([place])   . Dementia   . Diverticulosis   . Dyspnea    with exertion  . Dysrhythmia   . Elevated PSA   . Epididymitis   . Gallstone   . Gross hematuria   . History of kidney stones   . Hyperlipidemia   . Kidney stones   . Nocturia   . Testicular pain   . TIA (transient ischemic attack)    June 2018  . Urinary retention     Past Surgical History:  Procedure Laterality Date  . CHOLECYSTECTOMY N/A 07/11/2015   Procedure: LAPAROSCOPIC CHOLECYSTECTOMY;  Surgeon: Rolm Bookbinder, MD;  Location: [facility];  Service: General;  Laterality: N/A;  . ERCP N/A 07/09/2015   Procedure: ENDOSCOPIC RETROGRADE CHOLANGIOPANCREATOGRAPHY (ERCP);  Surgeon: Clarene Essex, MD;  Location: [facility] ENDOSCOPY;  Service: Endoscopy;  Laterality: N/A;  . INGUINAL HERNIA REPAIR Left 04/07/2017   Procedure: HERNIA REPAIR INGUINAL ADULT;  Surgeon: Clayburn Pert, MD;  Location: [facility];  Service: General;  Laterality: Left;  . INSERTION OF MESH Left 04/07/2017   Procedure: INSERTION OF MESH;  Surgeon: Clayburn Pert, MD;  Location: [facility];  Service: General;  Laterality: Left;    Family History  Problem Relation Age of Onset  . Prostate cancer Brother   . Kidney disease Neg Hx   . Bladder Cancer Neg Hx     Social History:  reports that he has quit smoking. His smoking use included Cigarettes. He smoked 0.50 packs per day. He has quit using smokeless tobacco. He reports that he does not drink alcohol or use drugs.  Allergies:  Allergies  Allergen Reactions  . Clindamycin Hcl     Other reaction(s): Unknown Reaction:  Unknown   . Clindamycin/Lincomycin Other (See Comments)    Reaction:  Unknown   . Sulfamethoxazole-Trimethoprim Rash    Medications reviewed.    ROS A multipoint review of systems was completed, all pertinent positives and negatives are documented in the history of present illness and remainder are negative   BP (!) 142/76   Pulse (!) 51   Temp 97.9 F (36.6 C) (Oral)   Wt 75.8 kg (167 lb)   BMI 22.65 kg/m   Physical Exam Gen.: No acute distress  chest: Clear to auscultation  heart: Regular rhythm Abdomen: Soft and nontender. Left inguinal hernia repair intact without any evidence of erythema or drainage. Normal healing ridge at the suture line and no evidence of impulse or bulge on Valsalva.    No results found for this or any previous visit (from  the past 48 hour(s)). No results found.  Assessment/Plan:  1. Aftercare following surgery 83 year old male status post open left inguinal hernia. Doing very well. Discussed the signs and symptoms of infection or recurrent and return to clinic immediately should they occur. Otherwise he will follow up on an as-needed basis. Standard postoperative precautions provided.     Clayburn Pert, MD [facility] Surgeon  04/20/2017,9:53 AM

## 2017-04-29 ENCOUNTER — Ambulatory Visit
Admission: RE | Admit: 2017-04-29 | Discharge: 2017-04-29 | Disposition: A | Discharge status: Home / Self Care | Payer: Medicare HMO | Source: Ambulatory Visit | Attending: Urology | Admitting: Urology

## 2017-04-29 ENCOUNTER — Inpatient Hospital Stay: Admission: RE | Admit: 2017-04-29 | Payer: Self-pay | Source: Ambulatory Visit | Admitting: *Deleted

## 2017-04-29 ENCOUNTER — Encounter: Payer: Self-pay | Admitting: Urology

## 2017-04-29 ENCOUNTER — Ambulatory Visit (INDEPENDENT_AMBULATORY_CARE_PROVIDER_SITE_OTHER): Payer: Medicare HMO | Admitting: Urology

## 2017-04-29 VITALS — BP 136/78 | HR 103 | Resp 114 | Ht 72.0 in | Wt 184.0 lb

## 2017-04-29 DIAGNOSIS — N200 Calculus of kidney: Secondary | ICD-10-CM | POA: Insufficient documentation

## 2017-04-29 DIAGNOSIS — N138 Other obstructive and reflux uropathy: Secondary | ICD-10-CM | POA: Diagnosis not present

## 2017-04-29 DIAGNOSIS — N401 Enlarged prostate with lower urinary tract symptoms: Secondary | ICD-10-CM

## 2017-04-29 DIAGNOSIS — N390 Urinary tract infection, site not specified: Secondary | ICD-10-CM | POA: Diagnosis not present

## 2017-04-29 DIAGNOSIS — R972 Elevated prostate specific antigen [PSA]: Secondary | ICD-10-CM

## 2017-04-29 LAB — MICROSCOPIC EXAMINATION: Epithelial Cells (non renal): NONE SEEN /hpf (ref 0–10)

## 2017-04-29 LAB — URINALYSIS, COMPLETE
Bilirubin, UA: NEGATIVE
Glucose, UA: NEGATIVE
NITRITE UA: POSITIVE — AB
Protein, UA: NEGATIVE
Specific Gravity, UA: >1.03 — ABNORMAL HIGH (ref 1.005–1.030)
Urobilinogen, Ur: 0.2 mg/dL (ref 0.2–1.0)
pH, UA: 5.5 (ref 5.0–7.5)

## 2017-04-29 LAB — BLADDER SCAN

## 2017-04-29 NOTE — Progress Notes (Signed)
04/29/2017 3:25 PM   [address] [birth year] [id number]  Referring provider: [name], MD [place], [place] [address]  Chief Complaint  Patient presents with  . Follow-up    UA and X-ray done today    HPI: 83 year old white male with history of elevated PSA,  urinary tract infection with sepsis and BPH with LUTS/ incomplete bladder emptying/ history of urinary retention . Returns today for annual follow-up.Marland Kitchen  Elevated PSA He does have a history of elevated PSA reaching as high as 23.8 in 02/2014 at which time he underwent a prostate biopsy which was negative. He had previous other biopsies as well which were also negative. His most recent PSA was down to 8.1 in 08/2015.  Rectal exams enlarged, no masses.     Given his age and comorbidities, PSA screening has been discontinued.  BPH WITH LUTS Currently on Flomax/finasteride chronically.  History of urinary retention requiring Foley catheterization approximately one year ago. Also has a history of incomplete bladder emptying with elevated postvoid residuals in the 100-200 cc range.  Previous TRUS vol 234 cc in 2015 on finasteride.  CT scan of abdomen and pelvis in 10 2016 shows incidental left prostatic irregularity.  Cystoscopy 08/2015 shows severe trilobar coaptation, severe heavily trabeculated bladder with well circumscribed massive median lobe retreating into the base of the bladder.   Continues to have no urinary complaints.  Recurrent UTIs Hospital admission in 06/2015 for gram-negative rod sepsis/ UTI/ E. coli. Appears to be chronically colonized.    He denies any UTI since/tear but review of records reveal that he's been treated several times, most recently in 02/07/2017 at which time he was complaining of dysuria and worsening confusion.  Today, his UA appear suspicious, nitrate positive. He denies any urinary symptoms including no dysuria, confusion different from his baseline, fevers, or any  other UTI symptoms.  History of kidney stones Personal history of kidney stones. KUB today shows stable 3 mm left upper pole stone. No new stones in the kidney or bladder. Asymptomatic.  PMH: Past Medical History:  Diagnosis Date  . Aneurysm ([place])    Abdominal Aortic Aneurysm  . Atrial fibrillation ([place])   . Blockage of a bile duct   . BPH with obstruction/lower urinary tract symptoms   . Cellulitis of scrotum   . COPD (chronic obstructive pulmonary disease) ([place])   . Dementia   . Diverticulosis   . Dyspnea    with exertion  . Dysrhythmia   . Elevated PSA   . Epididymitis   . Gallstone   . Gross hematuria   . History of kidney stones   . Hyperlipidemia   . Kidney stones   . Nocturia   . Testicular pain   . TIA (transient ischemic attack)    June 2018  . Urinary retention     Surgical History: Past Surgical History:  Procedure Laterality Date  . CHOLECYSTECTOMY N/A 07/11/2015   Procedure: LAPAROSCOPIC CHOLECYSTECTOMY;  Surgeon: Rolm Bookbinder, MD;  Location: [facility];  Service: General;  Laterality: N/A;  . ERCP N/A 07/09/2015   Procedure: ENDOSCOPIC RETROGRADE CHOLANGIOPANCREATOGRAPHY (ERCP);  Surgeon: Clarene Essex, MD;  Location: [facility] ENDOSCOPY;  Service: Endoscopy;  Laterality: N/A;  . INGUINAL HERNIA REPAIR Left 04/07/2017   Procedure: HERNIA REPAIR INGUINAL ADULT;  Surgeon: Clayburn Pert, MD;  Location: [facility];  Service: General;  Laterality: Left;  . INSERTION OF MESH Left 04/07/2017   Procedure: INSERTION OF MESH;  Surgeon: Clayburn Pert, MD;  Location:  [facility];  Service: General;  Laterality: Left;    Home Medications:  Allergies as of 04/29/2017      Reactions   Clindamycin Hcl    Other reaction(s): Unknown Reaction:  Unknown    Clindamycin/lincomycin Other (See Comments)   Reaction:  Unknown    Sulfamethoxazole-trimethoprim Rash      Medication List       Accurate as of 04/29/17 11:59 PM. Always use your most recent med list.            AEROCHAMBER MV inhaler Use as instructed   albuterol 108 (90 Base) MCG/ACT inhaler Commonly known as:  PROVENTIL HFA;VENTOLIN HFA Inhale 2 puffs into the lungs every 6 (six) hours as needed for wheezing or shortness of breath.   amiodarone 200 MG tablet Commonly known as:  PACERONE Take 200 mg by mouth at bedtime.   apixaban 5 MG Tabs tablet Commonly known as:  ELIQUIS Take 1 tablet (5 mg total) by mouth 2 (two) times daily.   aspirin EC 325 MG tablet Take 325 mg by mouth daily.   Cranberry 250 MG Caps Take 250 mg by mouth daily.   donepezil 5 MG tablet Commonly known as:  ARICEPT Take 5 mg by mouth at bedtime.   finasteride 5 MG tablet Commonly known as:  PROSCAR Take 1 tablet (5 mg total) by mouth daily.   guaiFENesin 100 MG/5ML Soln Commonly known as:  ROBITUSSIN Take 5 mLs (100 mg total) by mouth every 4 (four) hours as needed for cough or to loosen phlegm.   isosorbide mononitrate 30 MG 24 hr tablet Commonly known as:  IMDUR Take 30 mg by mouth at bedtime.   multivitamin with minerals Tabs tablet Take 1 tablet by mouth daily.   rosuvastatin 20 MG tablet Commonly known as:  CRESTOR Take 20 mg by mouth at bedtime.   tamsulosin 0.4 MG Caps capsule Commonly known as:  FLOMAX TAKE 1 CAPSULE BY MOUTH DAILY   vitamin B-12 1000 MCG tablet Commonly known as:  CYANOCOBALAMIN Take 1,000 mcg by mouth daily.       Allergies:  Allergies  Allergen Reactions  . Clindamycin Hcl     Other reaction(s): Unknown Reaction:  Unknown   . Clindamycin/Lincomycin Other (See Comments)    Reaction:  Unknown   . Sulfamethoxazole-Trimethoprim Rash    Family History: Family History  Problem Relation Age of Onset  . Prostate cancer Brother   . Kidney disease Neg Hx   . Bladder Cancer Neg Hx     Social History:  reports that he has quit smoking. His smoking use included Cigarettes. He smoked 0.50 packs per day. He has quit using smokeless tobacco. He reports that he  does not drink alcohol or use drugs.  ROS: UROLOGY Frequent Urination?: No Hard to postpone urination?: No Burning/pain with urination?: No Get up at night to urinate?: No Leakage of urine?: No Urine stream starts and stops?: No Trouble starting stream?: No Do you have to strain to urinate?: No Blood in urine?: No Urinary tract infection?: No Sexually transmitted disease?: No Injury to kidneys or bladder?: No Painful intercourse?: No Weak stream?: No Erection problems?: No Penile pain?: No  Gastrointestinal Nausea?: No Vomiting?: No Indigestion/heartburn?: No Diarrhea?: No Constipation?: No  Constitutional Fever: No Night sweats?: No Weight loss?: No Fatigue?: No  Skin Skin rash/lesions?: No Itching?: No  Eyes Blurred vision?: No Double vision?: No  Ears/Nose/Throat Sore throat?: No Sinus problems?: No  Hematologic/Lymphatic Swollen glands?: No Easy bruising?:  Yes  Cardiovascular Leg swelling?: No Chest pain?: No  Respiratory Cough?: No Shortness of breath?: Yes  Endocrine Excessive thirst?: No  Musculoskeletal Back pain?: No Joint pain?: No  Neurological Headaches?: No Dizziness?: No  Psychologic Depression?: No Anxiety?: No  Physical Exam: BP 136/78   Pulse (!) 103   Resp (!) 114   Ht 6' (1.829 m)   Wt 184 lb (83.5 kg)   BMI 24.95 kg/m   Constitutional:  Alert and oriented, No acute distress.  Pain by daughter. HEENT: [place] AT, moist mucus membranes.  Trachea midline, no masses. Cardiovascular: No clubbing, cyanosis, or edema. Respiratory: Normal respiratory effort, no increased work of breathing. GI: Abdomen is soft, nontender, nondistended, no abdominal masses.  Suprapubic tenderness. Skin: No rashes, bruises or suspicious lesions. Neurologic: Grossly intact, no focal deficits, moving all 4 extremities. Psychiatric: Normal mood and affect.  Laboratory Data: Lab Results  Component Value Date   WBC 12.6 (H) 04/04/2017   HGB  14.2 04/04/2017   HCT 42.5 04/04/2017   MCV 94.6 04/04/2017   PLT 186 04/04/2017    BMP Latest Ref Rng & Units 04/04/2017 02/21/2017 02/18/2017  Glucose 65 - 99 mg/dL 86 - 136(H)  BUN 6 - 20 mg/dL 14 - 16  Creatinine 0.61 - 1.24 mg/dL 1.20 0.93 1.08  Sodium 135 - 145 mmol/L 139 - 138  Potassium 3.5 - 5.1 mmol/L 3.9 - 3.6  Chloride 101 - 111 mmol/L 103 - 103  CO2 22 - 32 mmol/L 28 - 27  Calcium 8.9 - 10.3 mg/dL 8.8(L) - 8.7(L)    Lab Results  Component Value Date   HGBA1C 5.9 (H) 02/19/2017    Urinalysis/ Pertinent Imaging: Results for orders placed or performed in visit on 04/29/17  CULTURE, URINE COMPREHENSIVE  Result Value Ref Range   Urine Culture, Comprehensive Preliminary report (A)    Organism ID, Bacteria Escherichia coli (A)   Microscopic Examination  Result Value Ref Range   WBC, UA 11-30 (A) 0 - 5 /hpf   RBC, UA 0-2 0 - 2 /hpf   Epithelial Cells (non renal) None seen 0 - 10 /hpf   Mucus, UA Present (A) Not Estab.   Bacteria, UA Moderate (A) None seen/Few  Urinalysis, Complete  Result Value Ref Range   Specific Gravity, UA >1.030 (H) 1.005 - 1.030   pH, UA 5.5 5.0 - 7.5   Color, UA Yellow Yellow   Appearance Ur Cloudy (A) Clear   Leukocytes, UA 1+ (A) Negative   Protein, UA Negative Negative/Trace   Glucose, UA Negative Negative   Ketones, UA Trace (A) Negative   RBC, UA Trace (A) Negative   Bilirubin, UA Negative Negative   Urobilinogen, Ur 0.2 0.2 - 1.0 mg/dL   Nitrite, UA Positive (A) Negative   Microscopic Examination See below:   Bladder scan  Result Value Ref Range   Scan Result 35ml    CLINICAL DATA:  Hematuria.  History of kidney stones.  EXAM: ABDOMEN - 1 VIEW  COMPARISON:  03/25/2016  FINDINGS: A 3 mm calculus projecting over the lower pole of the left kidney is unchanged. No definite right renal calculi or ureteral calculi are identified. A small calcification in the right pelvis is unchanged and compatible with a phlebolith. Prior  cholecystectomy is noted. No acute osseous abnormality is seen. There is a nonobstructed bowel gas pattern.  IMPRESSION: Unchanged 3 mm left renal calculus.   Electronically Signed   By: Logan Bores M.D.   On: 04/29/2017  10:16  KUB reviewed personally today  Assessment & Plan:    1. Elevated PSA History of elevated PSA, most recent PSA 2016 appropriate for a gland size Given his age and comorbidities, further PSA screening is contraindicated   2. Nephrolithiasis KUB stable, 3 mm stone in left kidney Asymptomatic Will follow clinically - Urinalysis, Complete - CULTURE, URINE COMPREHENSIVE - Bladder scan  3. Recurrent UTI Recurrent UTIs, decreasing in interval Suspect he is chronically colonized by some lack of symptoms today and positive UA Culture sent today but we'll not treat unless become symptomatic-reviewed signs and symptoms of UTI including worsening confusion, fevers, chills, increased urinary symptoms, amongst others. Both he and his daughter understand.  4. BPH with obstruction/lower urinary tract symptoms Massive BPH-continue Flomax/finasteride chronically Asymptomatic with improved post void residual today compared to previous  Return in about 1 year (around 04/29/2018) for ipss/ pvr/ ua.  Hollice Espy, MD  [facility] [address], [place], [place] [address] [phone]

## 2017-05-02 LAB — CULTURE, URINE COMPREHENSIVE

## 2017-07-07 ENCOUNTER — Other Ambulatory Visit: Payer: Self-pay | Admitting: Urology

## 2017-07-07 DIAGNOSIS — N401 Enlarged prostate with lower urinary tract symptoms: Secondary | ICD-10-CM

## 2017-09-11 ENCOUNTER — Other Ambulatory Visit: Payer: Self-pay | Admitting: Urology

## 2017-09-11 DIAGNOSIS — N401 Enlarged prostate with lower urinary tract symptoms: Secondary | ICD-10-CM

## 2017-12-06 ENCOUNTER — Other Ambulatory Visit: Payer: Self-pay | Admitting: Urology

## 2017-12-06 DIAGNOSIS — N401 Enlarged prostate with lower urinary tract symptoms: Secondary | ICD-10-CM

## 2017-12-13 IMAGING — CR DG ABDOMEN 1V
1 series · 2 of 2 positions shown · non-contrast
Comparison: 03/25/2016

CLINICAL DATA: Hematuria.  History of kidney stones.

EXAM:
ABDOMEN - 1 VIEW

[Series 1: t abdomen supine · 0.14mm/px · 2 of 2 slices shown]
[im 1/2]
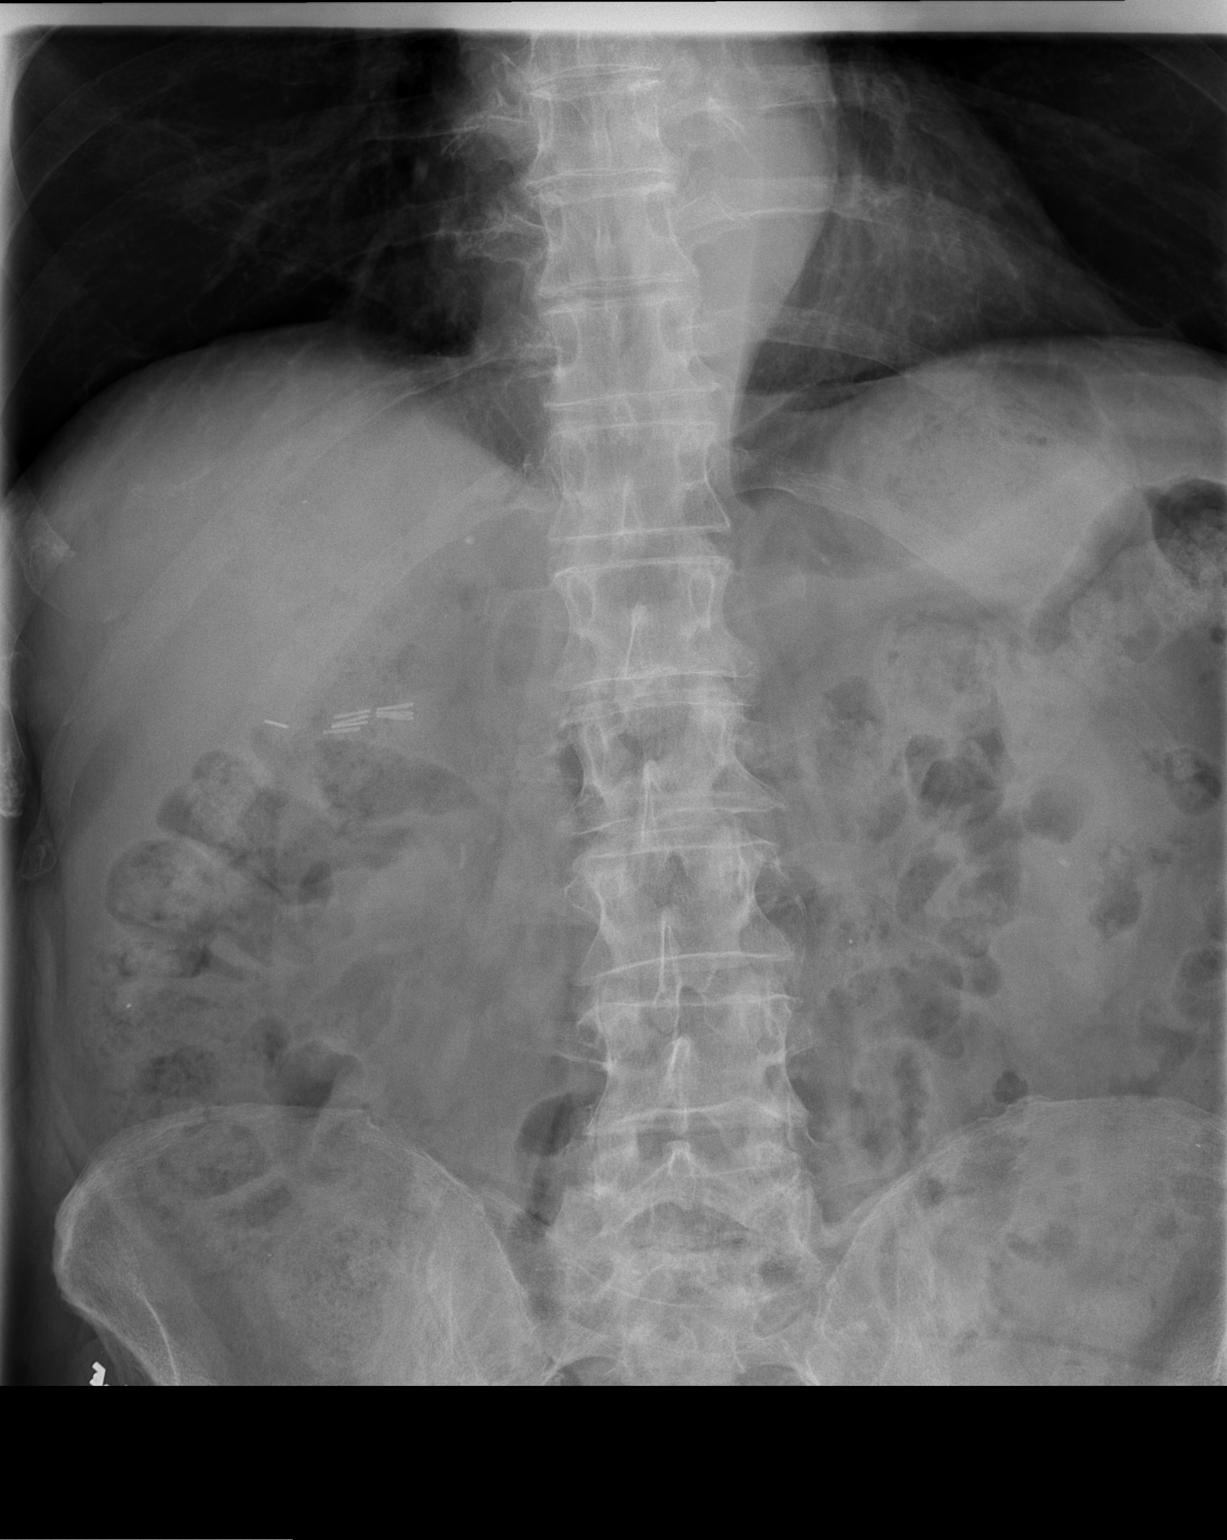
[im 2/2]
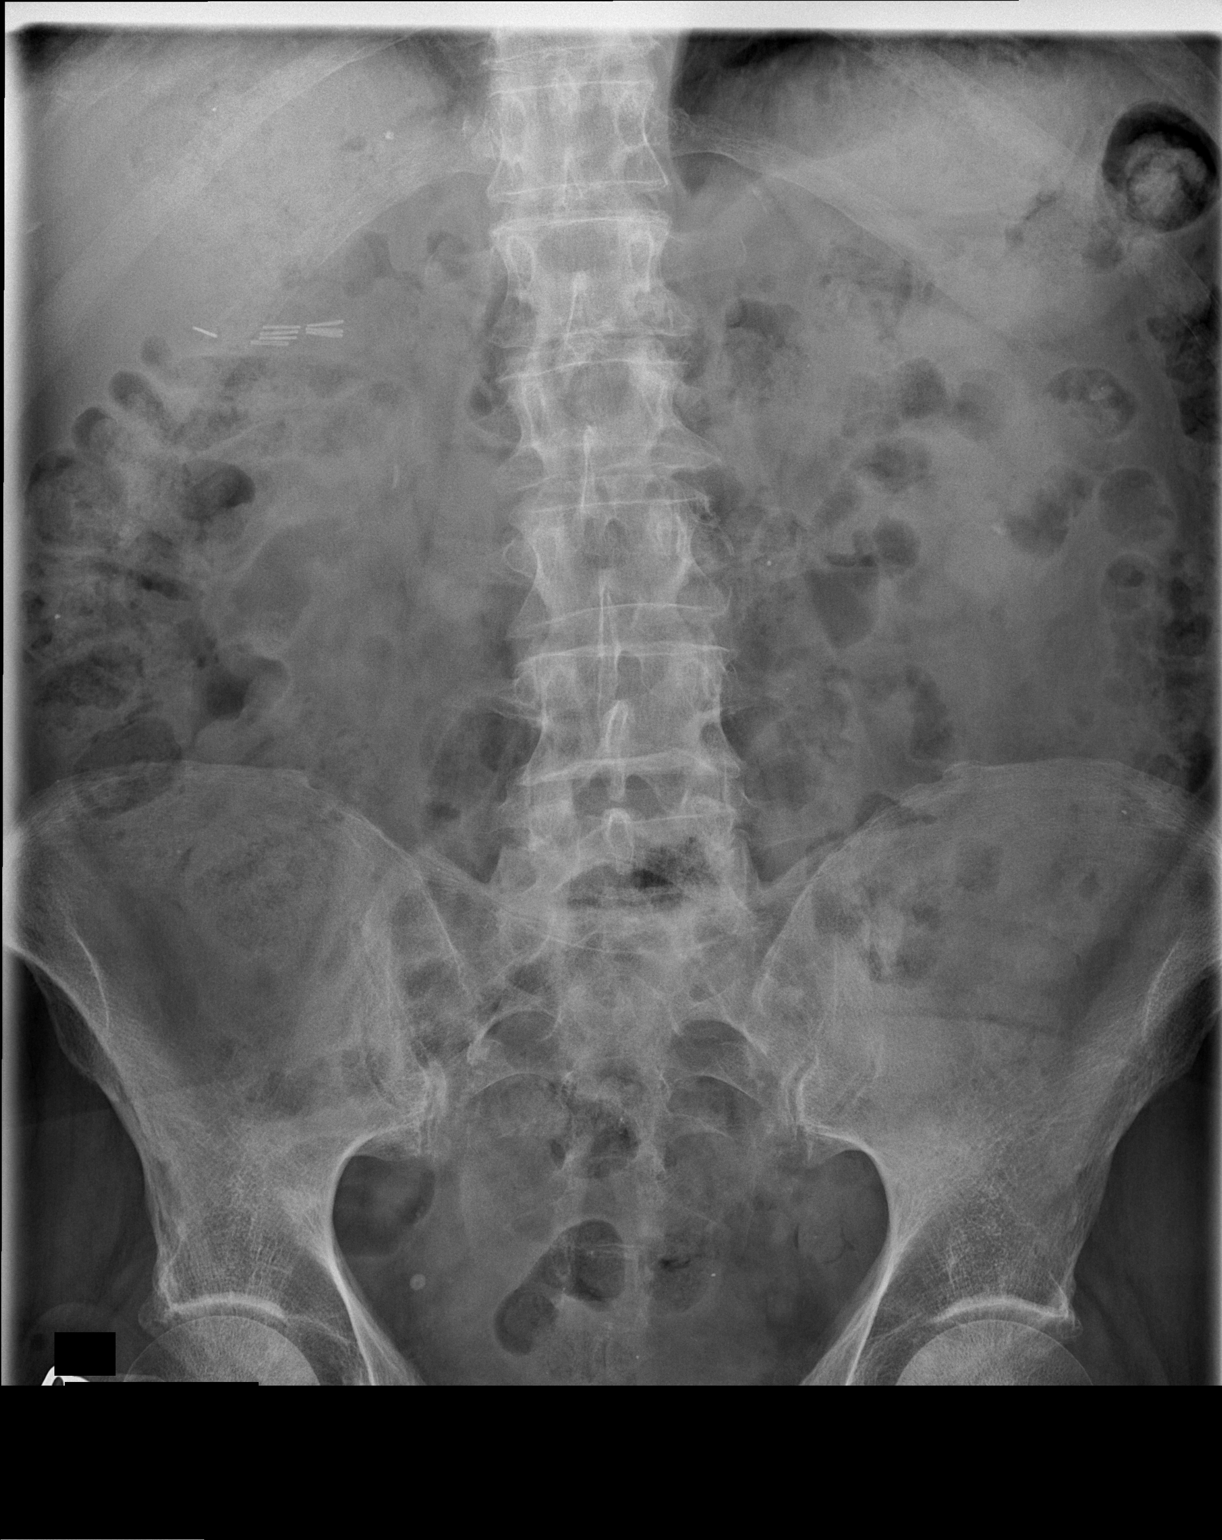

[2 of 2 positions shown; findings below may reference images not displayed]

FINDINGS: A 3 mm calculus projecting over the lower pole of the left kidney is
unchanged. No definite right renal calculi or ureteral calculi are
identified. A small calcification in the right pelvis is unchanged
and compatible with a phlebolith. Prior cholecystectomy is noted. No
acute osseous abnormality is seen. There is a nonobstructed bowel
gas pattern.
IMPRESSION: Unchanged 3 mm left renal calculus.

## 2018-01-09 ENCOUNTER — Ambulatory Visit
Admission: RE | Admit: 2018-01-09 | Discharge: 2018-01-09 | Disposition: A | Discharge status: Home / Self Care | Payer: Medicare HMO | Source: Ambulatory Visit | Attending: Cardiovascular Disease | Admitting: Cardiovascular Disease

## 2018-01-09 ENCOUNTER — Encounter
Admission: RE | Disposition: A | Discharge status: Home / Self Care | Payer: Self-pay | Source: Ambulatory Visit | Attending: Cardiovascular Disease

## 2018-01-09 DIAGNOSIS — I200 Unstable angina: Secondary | ICD-10-CM

## 2018-01-09 DIAGNOSIS — Z5309 Procedure and treatment not carried out because of other contraindication: Secondary | ICD-10-CM | POA: Diagnosis not present

## 2018-01-09 LAB — CBC
HCT: 44.3 % (ref 40.0–52.0)
Hemoglobin: 14.6 g/dL (ref 13.0–18.0)
MCH: 30.8 pg (ref 26.0–34.0)
MCHC: 33.1 g/dL (ref 32.0–36.0)
MCV: 93.1 fL (ref 80.0–100.0)
Platelets: 189 10*3/uL (ref 150–440)
RBC: 4.76 MIL/uL (ref 4.40–5.90)
RDW: 13.5 % (ref 11.5–14.5)
WBC: 8.2 10*3/uL (ref 3.8–10.6)

## 2018-01-09 LAB — COMPREHENSIVE METABOLIC PANEL
ALT: 28 U/L (ref 17–63)
AST: 29 U/L (ref 15–41)
Albumin: 3.1 g/dL — ABNORMAL LOW (ref 3.5–5.0)
Alkaline Phosphatase: 81 U/L (ref 38–126)
Anion gap: 7 (ref 5–15)
BUN: 15 mg/dL (ref 6–20)
CHLORIDE: 102 mmol/L (ref 101–111)
CO2: 29 mmol/L (ref 22–32)
Calcium: 8.4 mg/dL — ABNORMAL LOW (ref 8.9–10.3)
Creatinine, Ser: 1.21 mg/dL (ref 0.61–1.24)
GFR, EST NON AFRICAN AMERICAN: 54 mL/min — AB (ref >60)
Glucose, Bld: 99 mg/dL (ref 65–99)
POTASSIUM: 4.3 mmol/L (ref 3.5–5.1)
Sodium: 138 mmol/L (ref 135–145)
Total Bilirubin: 1 mg/dL (ref 0.3–1.2)
Total Protein: 6.5 g/dL (ref 6.5–8.1)

## 2018-01-09 SURGERY — INVASIVE LAB ABORTED CASE
Laterality: Left

## 2018-01-09 SURGERY — LEFT HEART CATH AND CORONARY ANGIOGRAPHY
Anesthesia: Moderate Sedation | Laterality: Right

## 2018-01-09 MED ORDER — SODIUM CHLORIDE 0.9 % WEIGHT BASED INFUSION: 1.0000 mL/kg/h | INTRAVENOUS | Status: DC

## 2018-01-09 MED ORDER — SODIUM CHLORIDE 0.9 % IV SOLN: 250.0000 mL | INTRAVENOUS | Status: DC | PRN

## 2018-01-09 MED ORDER — ASPIRIN 81 MG PO CHEW: 81.0000 mg | CHEWABLE_TABLET | ORAL | Status: DC

## 2018-01-09 MED ORDER — SODIUM CHLORIDE 0.9% FLUSH: 3.0000 mL | Freq: Two times a day (BID) | INTRAVENOUS | Status: DC

## 2018-01-09 MED ORDER — SODIUM CHLORIDE 0.9% FLUSH: 3.0000 mL | INTRAVENOUS | Status: DC | PRN

## 2018-01-09 MED ORDER — SODIUM CHLORIDE 0.9 % WEIGHT BASED INFUSION
3.0000 mL/kg/h | INTRAVENOUS | Status: DC
  Administered 2018-01-09: 3 mL/kg/h via INTRAVENOUS

## 2018-01-09 MED ORDER — SODIUM CHLORIDE 0.9 % WEIGHT BASED INFUSION: 3.0000 mL/kg/h | INTRAVENOUS | Status: DC

## 2018-01-09 NOTE — Progress Notes (Signed)
Procedure cancelled and rescheduled for tomorrow due to patient taking eliquis.

## 2018-01-10 ENCOUNTER — Encounter: Payer: Self-pay | Admitting: Cardiovascular Disease

## 2018-01-10 ENCOUNTER — Encounter
Admission: RE | Disposition: A | Discharge status: Home / Self Care | Payer: Self-pay | Source: Ambulatory Visit | Attending: Cardiovascular Disease

## 2018-01-10 ENCOUNTER — Ambulatory Visit: Admission: RE | Admit: 2018-01-10 | Payer: Medicare HMO | Source: Ambulatory Visit | Admitting: Cardiovascular Disease

## 2018-01-10 ENCOUNTER — Ambulatory Visit
Admission: RE | Admit: 2018-01-10 | Discharge: 2018-01-10 | Disposition: A | Discharge status: Home / Self Care | Payer: Medicare HMO | Source: Ambulatory Visit | Attending: Cardiovascular Disease | Admitting: Cardiovascular Disease

## 2018-01-10 DIAGNOSIS — I2582 Chronic total occlusion of coronary artery: Secondary | ICD-10-CM | POA: Insufficient documentation

## 2018-01-10 DIAGNOSIS — I2510 Atherosclerotic heart disease of native coronary artery without angina pectoris: Secondary | ICD-10-CM | POA: Insufficient documentation

## 2018-01-10 HISTORY — PX: LEFT HEART CATH AND CORONARY ANGIOGRAPHY: CATH118249

## 2018-01-10 SURGERY — LEFT HEART CATH AND CORONARY ANGIOGRAPHY
Anesthesia: Moderate Sedation

## 2018-01-10 SURGERY — LEFT HEART CATH AND CORONARY ANGIOGRAPHY
Anesthesia: Moderate Sedation | Laterality: Right

## 2018-01-10 SURGERY — LEFT HEART CATH AND CORONARY ANGIOGRAPHY
Anesthesia: Moderate Sedation | Laterality: Left

## 2018-01-10 MED ORDER — SODIUM CHLORIDE 0.9 % IV SOLN
INTRAVENOUS | Status: AC | PRN
  Administered 2018-01-10: 250 mL via INTRAVENOUS

## 2018-01-10 MED ORDER — SODIUM CHLORIDE 0.9 % WEIGHT BASED INFUSION: 1.0000 mL/kg/h | INTRAVENOUS | Status: DC

## 2018-01-10 MED ORDER — FENTANYL CITRATE (PF) 100 MCG/2ML IJ SOLN
INTRAMUSCULAR | Status: AC
  Filled 2018-01-10: qty 2

## 2018-01-10 MED ORDER — SODIUM CHLORIDE 0.9% FLUSH: 3.0000 mL | Freq: Two times a day (BID) | INTRAVENOUS | Status: DC

## 2018-01-10 MED ORDER — IOPAMIDOL (ISOVUE-300) INJECTION 61%
INTRAVENOUS | Status: DC | PRN
  Administered 2018-01-10: 130 mL via INTRA_ARTERIAL

## 2018-01-10 MED ORDER — ACETAMINOPHEN 325 MG PO TABS: 650.0000 mg | ORAL_TABLET | ORAL | Status: DC | PRN

## 2018-01-10 MED ORDER — FENTANYL CITRATE (PF) 100 MCG/2ML IJ SOLN
INTRAMUSCULAR | Status: DC | PRN
  Administered 2018-01-10: 25 ug via INTRAVENOUS

## 2018-01-10 MED ORDER — SODIUM CHLORIDE 0.9% FLUSH: 3.0000 mL | INTRAVENOUS | Status: DC | PRN

## 2018-01-10 MED ORDER — ASPIRIN 81 MG PO CHEW: 81.0000 mg | CHEWABLE_TABLET | ORAL | Status: DC

## 2018-01-10 MED ORDER — MIDAZOLAM HCL 2 MG/2ML IJ SOLN
INTRAMUSCULAR | Status: DC | PRN
  Administered 2018-01-10: 1 mg via INTRAVENOUS

## 2018-01-10 MED ORDER — SODIUM CHLORIDE 0.9 % WEIGHT BASED INFUSION
3.0000 mL/kg/h | INTRAVENOUS | Status: DC
  Administered 2018-01-10: 3 mL/kg/h via INTRAVENOUS

## 2018-01-10 MED ORDER — SODIUM CHLORIDE 0.9 % WEIGHT BASED INFUSION
1.0000 mL/kg/h | INTRAVENOUS | Status: DC
  Administered 2018-01-10: 1 mL/kg/h via INTRAVENOUS

## 2018-01-10 MED ORDER — MIDAZOLAM HCL 2 MG/2ML IJ SOLN
INTRAMUSCULAR | Status: AC
  Filled 2018-01-10: qty 2

## 2018-01-10 MED ORDER — ONDANSETRON HCL 4 MG/2ML IJ SOLN: 4.0000 mg | Freq: Four times a day (QID) | INTRAMUSCULAR | Status: DC | PRN

## 2018-01-10 MED ORDER — SODIUM CHLORIDE 0.9 % IV SOLN: 250.0000 mL | INTRAVENOUS | Status: DC | PRN

## 2018-01-10 SURGICAL SUPPLY — 10 items
CATH INFINITI 5FR ANG PIGTAIL (CATHETERS) ×3 IMPLANT
CATH INFINITI 5FR JL4 (CATHETERS) ×3 IMPLANT
CATH INFINITI 5FR JL5 (CATHETERS) ×3 IMPLANT
CATH INFINITI JR4 5F (CATHETERS) ×3 IMPLANT
DEVICE CLOSURE MYNXGRIP 5F (Vascular Products) ×3 IMPLANT
KIT MANI 3VAL PERCEP (MISCELLANEOUS) ×3 IMPLANT
NEEDLE PERC 18GX7CM (NEEDLE) ×3 IMPLANT
PACK CARDIAC CATH (CUSTOM PROCEDURE TRAY) ×3 IMPLANT
SHEATH PINNACLE 5F 10CM (SHEATH) ×3 IMPLANT
WIRE GUIDERIGHT .035X150 (WIRE) ×3 IMPLANT

## 2018-01-10 NOTE — Progress Notes (Signed)
Patient discharged via wheelchair and private vehicle. Right groin site stable. No signs of bleeding or hematoma noted. Daughter at the bedside and discharge instructions given to daughter and patient.

## 2018-01-10 NOTE — Discharge Instructions (Signed)
Groin Insertion Instructions-If you lose feeling or develop tingling or pain in your leg or foot after the procedure, please walk around first.  If the discomfort does not improve , contact your physician and proceed to the nearest emergency room.  Loss of feeling in your leg might mean that a blockage has formed in the artery and this can be appropriately treated.  Limit your activity for the next two days after your procedure.  Avoid stooping, bending, heavy lifting or exertion as this may put pressure on the insertion site.  Resume normal activities in 48 hours.  You may shower after 24 hours but avoid excessive warm water and do not scrub the site.  Remove clear dressing in 48 hours.  If you have had a closure device inserted, do not soak in a tub bath or a hot tub for at least one week. ° °No driving for 48 hours after discharge.  After the procedure, check the insertion site occasionally.  If any oozing occurs or there is apparent swelling, firm pressure over the site will prevent a bruise from forming.  You can not hurt anything by pressing directly on the site.  The pressure stops the bleeding by allowing a small clot to form.  If the bleeding continues after the pressure has been applied for more than 15 minutes, call 911 or go to the nearest emergency room.   ° °The x-ray dye causes you to pass a considerate amount of urine.  For this reason, you will be asked to drink plenty of liquids after the procedure to prevent dehydration.  You may resume you regular diet.  Avoid caffeine products.   ° °For pain at the site of your procedure, take non-aspirin medicines such as Tylenol. ° °Medications: A. Hold Metformin for 48 hours if applicable.  B. Continue taking all your present medications at home unless your doctor prescribes any changes. ° °Moderate Conscious Sedation, Adult, Care After °These instructions provide you with information about caring for yourself after your procedure. Your health care provider  may also give you more specific instructions. Your treatment has been planned according to current medical practices, but problems sometimes occur. Call your health care provider if you have any problems or questions after your procedure. °What can I expect after the procedure? °After your procedure, it is common: °· To feel sleepy for several hours. °· To feel clumsy and have poor balance for several hours. °· To have poor judgment for several hours. °· To vomit if you eat too soon. ° °Follow these instructions at home: °For at least 24 hours after the procedure: ° °· Do not: °? Participate in activities where you could fall or become injured. °? Drive. °? Use heavy machinery. °? Drink alcohol. °? Take sleeping pills or medicines that cause drowsiness. °? Make important decisions or sign legal documents. °? Take care of children on your own. °· Rest. °Eating and drinking °· Follow the diet recommended by your health care provider. °· If you vomit: °? Drink water, juice, or soup when you can drink without vomiting. °? Make sure you have little or no nausea before eating solid foods. °General instructions °· Have a responsible adult stay with you until you are awake and alert. °· Take over-the-counter and prescription medicines only as told by your health care provider. °· If you smoke, do not smoke without supervision. °· Keep all follow-up visits as told by your health care provider. This is important. °Contact a health care provider   if: °· You keep feeling nauseous or you keep vomiting. °· You feel light-headed. °· You develop a rash. °· You have a fever. °Get help right away if: °· You have trouble breathing. °This information is not intended to replace advice given to you by your health care provider. Make sure you discuss any questions you have with your health care provider. °Document Released: 07/04/2013 Document Revised: 02/16/2016 Document Reviewed: 01/03/2016 °Elsevier Interactive Patient Education © 2018  [employer] ° °

## 2018-01-28 ENCOUNTER — Other Ambulatory Visit: Payer: Self-pay | Admitting: Urology

## 2018-01-28 DIAGNOSIS — N401 Enlarged prostate with lower urinary tract symptoms: Secondary | ICD-10-CM

## 2018-01-30 ENCOUNTER — Other Ambulatory Visit: Payer: Self-pay | Admitting: Urology

## 2018-01-30 DIAGNOSIS — N401 Enlarged prostate with lower urinary tract symptoms: Secondary | ICD-10-CM

## 2018-03-07 ENCOUNTER — Other Ambulatory Visit: Payer: Self-pay | Admitting: Urology

## 2018-03-07 DIAGNOSIS — N401 Enlarged prostate with lower urinary tract symptoms: Secondary | ICD-10-CM

## 2018-03-21 ENCOUNTER — Other Ambulatory Visit: Payer: Self-pay | Admitting: Urology

## 2018-03-21 DIAGNOSIS — N401 Enlarged prostate with lower urinary tract symptoms: Secondary | ICD-10-CM

## 2018-04-17 ENCOUNTER — Other Ambulatory Visit: Payer: Self-pay | Admitting: Urology

## 2018-04-17 DIAGNOSIS — N401 Enlarged prostate with lower urinary tract symptoms: Secondary | ICD-10-CM

## 2018-05-02 ENCOUNTER — Encounter: Payer: Self-pay | Admitting: Urology

## 2018-05-02 ENCOUNTER — Ambulatory Visit: Payer: Medicare HMO | Admitting: Urology

## 2018-05-02 VITALS — BP 124/73 | HR 79 | Ht 72.0 in | Wt 185.0 lb

## 2018-05-02 DIAGNOSIS — N390 Urinary tract infection, site not specified: Secondary | ICD-10-CM

## 2018-05-02 DIAGNOSIS — N200 Calculus of kidney: Secondary | ICD-10-CM

## 2018-05-02 DIAGNOSIS — N401 Enlarged prostate with lower urinary tract symptoms: Secondary | ICD-10-CM | POA: Diagnosis not present

## 2018-05-02 DIAGNOSIS — R972 Elevated prostate specific antigen [PSA]: Secondary | ICD-10-CM

## 2018-05-02 DIAGNOSIS — N138 Other obstructive and reflux uropathy: Secondary | ICD-10-CM

## 2018-05-02 DIAGNOSIS — Z87898 Personal history of other specified conditions: Secondary | ICD-10-CM | POA: Diagnosis not present

## 2018-05-02 LAB — BLADDER SCAN AMB NON-IMAGING

## 2018-05-02 LAB — URINALYSIS, COMPLETE
Bilirubin, UA: NEGATIVE
GLUCOSE, UA: NEGATIVE
Nitrite, UA: POSITIVE — AB
Specific Gravity, UA: 1.025 (ref 1.005–1.030)
UUROB: 1 mg/dL (ref 0.2–1.0)
pH, UA: 5.5 (ref 5.0–7.5)

## 2018-05-02 LAB — MICROSCOPIC EXAMINATION

## 2018-05-02 NOTE — Progress Notes (Signed)
05/02/2018 2:55 PM   [facility] Aug 21, 1934 [id number]  Referring provider: [name], MD [place], [place] [address]  Chief Complaint  Patient presents with  . Benign Prostatic Hypertrophy    1year    HPI: 84 year old white male with history of elevated PSA, urinary tract infection with sepsis and BPH with LUTS/ incomplete bladder emptying/ history of urinary retention who returns today for annual follow-up.  Since his last visit, it appears that he has been dealing with progressive dementia.  Elevated PSA He does have a history of elevated PSA reaching as high as 23.8 in 02/2014 at which time he underwent a prostate biopsy which was negative. He had previous other biopsies as well which were also negative. His most recent PSA was down to 8.1 in 08/2015. Rectal exams enlarged, no masses.    Given his age and comorbidities, PSA screening has been discontinued.  BPH WITH LUTS Currently on Flomax/finasteride chronically. History of urinary retention requiring Foley catheterization approximately several years ago. Also has a history of incomplete bladder emptying with elevated postvoid residuals in the 100-200 cc range. Previous TRUS vol 234 cc in 2015 on finasteride.  Cystoscopy 08/2015 shows severe trilobar coaptation, severe heavily trabeculated bladder with well circumscribed massive median lobe retreating into the base of the bladder.   Most recent CT scan on 06/2017 shows evidence of prostamegaly with sequela of bladder outlet obstruction.  Continues to have no urinary complaints.  IPSS as below. PVR 7 cc.    Recurrent UTIs Hospital admission in 06/2015 for gram-negative rod sepsis/ UTI/ E. Coli.   Appears to be chronically colonized, this is seen again today on UA without urinary symptoms.    Most recent UA/urine culture on 10/25/2017 frankly positive growing E. Coli.  He was asymptomatic at the time.  His daughter believes he was treated  for this although I do not see any documentation.  History of kidney stones Personal history of kidney stones. KUB 04/2017 shows stable 3 mm left upper pole stone.   No new stones in the kidney or bladder. Asymptomatic.   IPSS    Row Name 05/02/18 1400         International Prostate Symptom Score   How often have you had the sensation of not emptying your bladder?  Not at All     How often have you had to urinate less than every two hours?  Not at All     How often have you found you stopped and started again several times when you urinated?  Not at All     How often have you found it difficult to postpone urination?  Not at All     How often have you had a weak urinary stream?  Not at All     How often have you had to strain to start urination?  Not at All     How many times did you typically get up at night to urinate?  None     Total IPSS Score  0       Quality of Life due to urinary symptoms   If you were to spend the rest of your life with your urinary condition just the way it is now how would you feel about that?  Mostly Satisfied        Score:  1-7 Mild 8-19 Moderate 20-35 Severe   PMH: Past Medical History:  Diagnosis Date  . Aneurysm ([place])    Abdominal  Aortic Aneurysm  . Atrial fibrillation ([place])   . Blockage of a bile duct   . BPH with obstruction/lower urinary tract symptoms   . Cellulitis of scrotum   . COPD (chronic obstructive pulmonary disease) ([place])   . Dementia   . Diverticulosis   . Dyspnea    with exertion  . Dysrhythmia   . Elevated PSA   . Epididymitis   . Gallstone   . Gross hematuria   . History of kidney stones   . Hyperlipidemia   . Kidney stones   . Nocturia   . Testicular pain   . TIA (transient ischemic attack)    June 2018  . Urinary retention     Surgical History: Past Surgical History:  Procedure Laterality Date  . CHOLECYSTECTOMY N/A 07/11/2015   Procedure: LAPAROSCOPIC CHOLECYSTECTOMY;  Surgeon: Rolm Bookbinder, MD;   Location: [facility];  Service: General;  Laterality: N/A;  . ERCP N/A 07/09/2015   Procedure: ENDOSCOPIC RETROGRADE CHOLANGIOPANCREATOGRAPHY (ERCP);  Surgeon: Clarene Essex, MD;  Location: [facility] ENDOSCOPY;  Service: Endoscopy;  Laterality: N/A;  . INGUINAL HERNIA REPAIR Left 04/07/2017   Procedure: HERNIA REPAIR INGUINAL ADULT;  Surgeon: Clayburn Pert, MD;  Location: [facility] ORS;  Service: General;  Laterality: Left;  . INSERTION OF MESH Left 04/07/2017   Procedure: INSERTION OF MESH;  Surgeon: Clayburn Pert, MD;  Location: [facility] ORS;  Service: General;  Laterality: Left;  . LEFT HEART CATH AND CORONARY ANGIOGRAPHY N/A 01/10/2018   Procedure: LEFT HEART CATH AND CORONARY ANGIOGRAPHY;  Surgeon: Dionisio David, MD;  Location: [facility];  Service: Cardiovascular;  Laterality: N/A;    Home Medications:  Allergies as of 05/02/2018      Reactions   Clindamycin Hcl    Other reaction(s): Unknown Reaction:  Unknown    Clindamycin/lincomycin Other (See Comments)   Reaction:  Unknown    Sulfamethoxazole-trimethoprim Rash      Medication List        Accurate as of 05/02/18  2:55 PM. Always use your most recent med list.          AEROCHAMBER MV inhaler Use as instructed   albuterol 108 (90 Base) MCG/ACT inhaler Commonly known as:  PROVENTIL HFA;VENTOLIN HFA Inhale 2 puffs into the lungs every 6 (six) hours as needed for wheezing or shortness of breath.   amiodarone 200 MG tablet Commonly known as:  PACERONE Take 200 mg by mouth at bedtime.   apixaban 5 MG Tabs tablet Commonly known as:  ELIQUIS Take 1 tablet (5 mg total) by mouth 2 (two) times daily.   aspirin EC 325 MG tablet Take 325 mg by mouth daily.   carvedilol 3.125 MG tablet Commonly known as:  COREG   Cranberry 250 MG Caps Take 250 mg by mouth daily.   donepezil 5 MG tablet Commonly known as:  ARICEPT Take 5 mg by mouth at bedtime.   enalapril 2.5 MG tablet Commonly known as:  VASOTEC   finasteride 5 MG  tablet Commonly known as:  PROSCAR TAKE 1 TABLET BY MOUTH EVERY DAY   furosemide 20 MG tablet Commonly known as:  LASIX   isosorbide mononitrate 30 MG 24 hr tablet Commonly known as:  IMDUR Take 30 mg by mouth at bedtime.   memantine 10 MG tablet Commonly known as:  NAMENDA Take 10 mg by mouth daily.   mirtazapine 15 MG tablet Commonly known as:  REMERON   multivitamin with minerals Tabs tablet Take 1 tablet by mouth daily.  rosuvastatin 20 MG tablet Commonly known as:  CRESTOR Take 20 mg by mouth at bedtime.   tamsulosin 0.4 MG Caps capsule Commonly known as:  FLOMAX TAKE 1 CAPSULE BY MOUTH EVERY DAY   vitamin B-12 1000 MCG tablet Commonly known as:  CYANOCOBALAMIN Take 1,000 mcg by mouth daily.       Allergies:  Allergies  Allergen Reactions  . Clindamycin Hcl     Other reaction(s): Unknown Reaction:  Unknown   . Clindamycin/Lincomycin Other (See Comments)    Reaction:  Unknown   . Sulfamethoxazole-Trimethoprim Rash    Family History: Family History  Problem Relation Age of Onset  . Prostate cancer Brother   . Kidney disease Neg Hx   . Bladder Cancer Neg Hx     Social History:  reports that he has quit smoking. His smoking use included cigarettes. He smoked 0.50 packs per day. He has never used smokeless tobacco. He reports that he does not drink alcohol or use drugs.  ROS: UROLOGY Frequent Urination?: No Hard to postpone urination?: No Burning/pain with urination?: No Get up at night to urinate?: No Leakage of urine?: No Urine stream starts and stops?: No Trouble starting stream?: No Do you have to strain to urinate?: No Blood in urine?: No Urinary tract infection?: No Sexually transmitted disease?: No Injury to kidneys or bladder?: No Painful intercourse?: No Weak stream?: No Erection problems?: No Penile pain?: No  Gastrointestinal Nausea?: No Vomiting?: No Indigestion/heartburn?: No Diarrhea?: No Constipation?:  No  Constitutional Fever: No Night sweats?: No Weight loss?: No Fatigue?: No  Skin Skin rash/lesions?: No Itching?: No  Eyes Blurred vision?: No Double vision?: No  Ears/Nose/Throat Sore throat?: No Sinus problems?: No  Hematologic/Lymphatic Swollen glands?: No Easy bruising?: No  Cardiovascular Leg swelling?: No Chest pain?: No  Respiratory Cough?: No Shortness of breath?: No  Endocrine Excessive thirst?: No  Musculoskeletal Back pain?: No Joint pain?: No  Neurological Headaches?: No Dizziness?: No  Psychologic Depression?: No Anxiety?: No  Physical Exam: BP 124/73   Pulse 79   Ht 6' (1.829 m)   Wt 185 lb (83.9 kg)   BMI 25.09 kg/m   Constitutional:  Alert and oriented but notably less sharp today than on previous occasions, No acute distress.  Accompanied by daughter today. HEENT: [place] AT, moist mucus membranes.  Trachea midline, no masses. Cardiovascular: No clubbing, cyanosis, or edema. Respiratory: Normal respiratory effort, no increased work of breathing. Skin: No rashes, bruises or suspicious lesions. Neurologic: Grossly intact, no focal deficits, moving all 4 extremities. Psychiatric: Normal mood and affect.  Laboratory Data: Lab Results  Component Value Date   WBC 8.2 01/09/2018   HGB 14.6 01/09/2018   HCT 44.3 01/09/2018   MCV 93.1 01/09/2018   PLT 189 01/09/2018    Lab Results  Component Value Date   CREATININE 1.21 01/09/2018    Lab Results  Component Value Date   HGBA1C 5.9 (H) 02/19/2017    Urinalysis UA today reviewed, frankly positive and unchanged from previous urinalysis.  Pertinent Imaging: Report from CT abdomen from 06/2017 reviewed.  Sequela of severe prostamegaly with bladder wall thickening.  3 mm left renal calculus.  Assessment & Plan:    1. BPH with obstruction/lower urinary tract symptoms Doing well on finasteride and Flomax Continue these medications indefinitely Sequela of chronic outlet  obstruction secondary to prostamegaly on imaging and previous cystoscopy High risk for recurrent retention Bladder scan today with minimal postvoid residual We will continue to follow with annual PVR -  Urinalysis, Complete - BLADDER SCAN AMB NON-IMAGING  2. Recurrent UTI History of chronically positive UA/urine cultures with E. coli Consistent with chronic colonization Otherwise asymptomatic thus no need to treat unless he becomes symptomatic Continue cranberry tablets  3. History of urinary retention As per #1  4. Kidney stones Stable left 3 mm stone We will follow conservatively Currently asymptomatic   Return in about 1 year (around 05/03/2019) for IPSS/ PVR.  Hollice Espy, MD  [facility] [address] [place], [place] [address] [phone]

## 2018-05-27 ENCOUNTER — Other Ambulatory Visit: Payer: Self-pay | Admitting: Urology

## 2018-05-27 DIAGNOSIS — N401 Enlarged prostate with lower urinary tract symptoms: Secondary | ICD-10-CM

## 2018-06-26 ENCOUNTER — Other Ambulatory Visit: Payer: Self-pay | Admitting: Urology

## 2018-06-26 DIAGNOSIS — N401 Enlarged prostate with lower urinary tract symptoms: Secondary | ICD-10-CM

## 2018-06-28 ENCOUNTER — Other Ambulatory Visit: Payer: Self-pay | Admitting: Family Medicine

## 2018-06-28 DIAGNOSIS — N401 Enlarged prostate with lower urinary tract symptoms: Secondary | ICD-10-CM

## 2018-06-28 MED ORDER — TAMSULOSIN HCL 0.4 MG PO CAPS: 0.4000 mg | ORAL_CAPSULE | Freq: Every day | ORAL | 4 refills | Status: DC

## 2018-06-28 MED ORDER — FINASTERIDE 5 MG PO TABS: 5.0000 mg | ORAL_TABLET | Freq: Every day | ORAL | 4 refills | Status: DC

## 2018-09-25 ENCOUNTER — Encounter: Payer: Self-pay | Admitting: Emergency Medicine

## 2018-09-25 ENCOUNTER — Other Ambulatory Visit: Payer: Self-pay

## 2018-09-25 ENCOUNTER — Emergency Department
Admission: EM | Admit: 2018-09-25 | Discharge: 2018-09-25 | Disposition: A | Discharge status: Home / Self Care | Payer: Medicare HMO | Attending: Emergency Medicine | Admitting: Emergency Medicine

## 2018-09-25 DIAGNOSIS — R410 Disorientation, unspecified: Secondary | ICD-10-CM | POA: Diagnosis not present

## 2018-09-25 DIAGNOSIS — R4182 Altered mental status, unspecified: Secondary | ICD-10-CM

## 2018-09-25 DIAGNOSIS — Z87891 Personal history of nicotine dependence: Secondary | ICD-10-CM | POA: Insufficient documentation

## 2018-09-25 DIAGNOSIS — E785 Hyperlipidemia, unspecified: Secondary | ICD-10-CM | POA: Diagnosis not present

## 2018-09-25 DIAGNOSIS — J449 Chronic obstructive pulmonary disease, unspecified: Secondary | ICD-10-CM | POA: Diagnosis not present

## 2018-09-25 DIAGNOSIS — F0390 Unspecified dementia without behavioral disturbance: Secondary | ICD-10-CM | POA: Diagnosis not present

## 2018-09-25 DIAGNOSIS — N390 Urinary tract infection, site not specified: Secondary | ICD-10-CM | POA: Insufficient documentation

## 2018-09-25 DIAGNOSIS — Z79899 Other long term (current) drug therapy: Secondary | ICD-10-CM | POA: Diagnosis not present

## 2018-09-25 LAB — COMPREHENSIVE METABOLIC PANEL
ALT: 25 U/L (ref 0–44)
AST: 26 U/L (ref 15–41)
Albumin: 4 g/dL (ref 3.5–5.0)
Alkaline Phosphatase: 86 U/L (ref 38–126)
Anion gap: 8 (ref 5–15)
BILIRUBIN TOTAL: 0.8 mg/dL (ref 0.3–1.2)
BUN: 17 mg/dL (ref 8–23)
CHLORIDE: 103 mmol/L (ref 98–111)
CO2: 27 mmol/L (ref 22–32)
CREATININE: 1.26 mg/dL — AB (ref 0.61–1.24)
Calcium: 8.9 mg/dL (ref 8.9–10.3)
GFR, EST NON AFRICAN AMERICAN: 53 mL/min — AB (ref >60)
Glucose, Bld: 105 mg/dL — ABNORMAL HIGH (ref 70–99)
POTASSIUM: 4.1 mmol/L (ref 3.5–5.1)
Sodium: 138 mmol/L (ref 135–145)
TOTAL PROTEIN: 7.1 g/dL (ref 6.5–8.1)

## 2018-09-25 LAB — URINALYSIS, COMPLETE (UACMP) WITH MICROSCOPIC
BILIRUBIN URINE: NEGATIVE
Bacteria, UA: NONE SEEN
GLUCOSE, UA: NEGATIVE mg/dL
KETONES UR: NEGATIVE mg/dL
NITRITE: NEGATIVE
PH: 5 (ref 5.0–8.0)
Protein, ur: NEGATIVE mg/dL
SPECIFIC GRAVITY, URINE: 1.016 (ref 1.005–1.030)
Squamous Epithelial / LPF: NONE SEEN (ref 0–5)
WBC, UA: >50 WBC/hpf — ABNORMAL HIGH (ref 0–5)

## 2018-09-25 LAB — CBC
HEMATOCRIT: 44.3 % (ref 39.0–52.0)
Hemoglobin: 14.1 g/dL (ref 13.0–17.0)
MCH: 30.5 pg (ref 26.0–34.0)
MCHC: 31.8 g/dL (ref 30.0–36.0)
MCV: 95.7 fL (ref 80.0–100.0)
Platelets: 186 10*3/uL (ref 150–400)
RBC: 4.63 MIL/uL (ref 4.22–5.81)
RDW: 13.2 % (ref 11.5–15.5)
WBC: 7.2 10*3/uL (ref 4.0–10.5)
nRBC: 0 % (ref 0.0–0.2)

## 2018-09-25 MED ORDER — CEPHALEXIN 500 MG PO CAPS: 500.0000 mg | ORAL_CAPSULE | Freq: Two times a day (BID) | ORAL | 0 refills | Status: DC

## 2018-09-25 MED ORDER — SODIUM CHLORIDE 0.9 % IV SOLN
1.0000 g | Freq: Once | INTRAVENOUS | Status: AC
  Administered 2018-09-25: 1 g via INTRAVENOUS
  Filled 2018-09-25: qty 10

## 2018-09-25 NOTE — ED Provider Notes (Signed)
[facility] Emergency Department Provider Note  Time seen: 10:39 AM  I have reviewed the triage vital signs and the nursing notes.   HISTORY  Chief Complaint Altered Mental Status    HPI Chris Simon is a 84 y.o. male with a past medical history of dementia, COPD, hypertension, hyperlipidemia, presents to the emergency department for increased confusion and difficulty sleeping.  According to the daughter who lives with the patient he has baseline dementia, but states over the past 2 days his confusion seems to be worse.  States yesterday he was picking at his close and blanket, and a restless fashion.  Slept very little last night which the patient admits he was feeling very restless.  Denies any fever cough or congestion, nausea vomiting or diarrhea.  No recent illnesses.  Currently the patient appears very well, no distress with no complaints.   Past Medical History:  Diagnosis Date  . Aneurysm ([place])    Abdominal Aortic Aneurysm  . Atrial fibrillation ([place])   . Blockage of a bile duct   . BPH with obstruction/lower urinary tract symptoms   . Cellulitis of scrotum   . COPD (chronic obstructive pulmonary disease) ([place])   . Dementia ([place])   . Diverticulosis   . Dyspnea    with exertion  . Dysrhythmia   . Elevated PSA   . Epididymitis   . Gallstone   . Gross hematuria   . History of kidney stones   . Hyperlipidemia   . Kidney stones   . Nocturia   . Testicular pain   . TIA (transient ischemic attack)    June 2018  . Urinary retention     Patient Active Problem List   Diagnosis Date Noted  . Pneumonia 02/20/2017  . TIA (transient ischemic attack) 02/18/2017  . Left inguinal hernia 06/22/2016  . Umbilical hernia without obstruction and without gangrene 06/22/2016  . Calculus of kidney 09/03/2015  . Testicular cyst 09/03/2015  . Sepsis secondary to UTI ([place]) 07/20/2015  . BPH (benign prostatic hyperplasia) 07/18/2015  . Elevated PSA  07/18/2015  . Sepsis ([place]) 07/09/2015  . Acute kidney injury ([place])   . Paroxysmal atrial fibrillation ([facility])   . UTI (lower urinary tract infection)   . Atrial fibrillation ([place]) 10/01/2014  . Pure hypercholesterolemia 10/01/2014  . Benign fibroma of prostate 10/06/2012  . Bladder retention 10/06/2012    Past Surgical History:  Procedure Laterality Date  . CHOLECYSTECTOMY N/A 07/11/2015   Procedure: LAPAROSCOPIC CHOLECYSTECTOMY;  Surgeon: Rolm Bookbinder, MD;  Location: [facility];  Service: General;  Laterality: N/A;  . ERCP N/A 07/09/2015   Procedure: ENDOSCOPIC RETROGRADE CHOLANGIOPANCREATOGRAPHY (ERCP);  Surgeon: Clarene Essex, MD;  Location: [facility] ENDOSCOPY;  Service: Endoscopy;  Laterality: N/A;  . INGUINAL HERNIA REPAIR Left 04/07/2017   Procedure: HERNIA REPAIR INGUINAL ADULT;  Surgeon: Clayburn Pert, MD;  Location: [facility] ORS;  Service: General;  Laterality: Left;  . INSERTION OF MESH Left 04/07/2017   Procedure: INSERTION OF MESH;  Surgeon: Clayburn Pert, MD;  Location: [facility] ORS;  Service: General;  Laterality: Left;  . LEFT HEART CATH AND CORONARY ANGIOGRAPHY N/A 01/10/2018   Procedure: LEFT HEART CATH AND CORONARY ANGIOGRAPHY;  Surgeon: Dionisio David, MD;  Location: [facility];  Service: Cardiovascular;  Laterality: N/A;    Prior to Admission medications   Medication Sig Start Date End Date Taking? Authorizing Provider  albuterol (PROVENTIL HFA;VENTOLIN HFA) 108 (90 Base) MCG/ACT inhaler Inhale 2 puffs into the lungs every 6 (  six) hours as needed for wheezing or shortness of breath. 12/13/16   Merlyn Lot, MD  amiodarone (PACERONE) 200 MG tablet Take 200 mg by mouth at bedtime.     [provider]  apixaban (ELIQUIS) 5 MG TABS tablet Take 1 tablet (5 mg total) by mouth 2 (two) times daily. 02/21/17   Demetrios Loll, MD  aspirin EC 325 MG tablet Take 325 mg by mouth daily.    [provider]  carvedilol (COREG) 3.125 MG tablet  04/26/18   [provider]  Cranberry 250 MG CAPS Take 250 mg by mouth daily.    [provider]  donepezil (ARICEPT) 5 MG tablet Take 5 mg by mouth at bedtime. 03/24/17 04/23/17  [provider]  enalapril (VASOTEC) 2.5 MG tablet  04/12/18   [provider]  finasteride (PROSCAR) 5 MG tablet TAKE 1 TABLET BY MOUTH EVERY DAY 07/03/18   McGowan, Larene Beach A, PA-C  finasteride (PROSCAR) 5 MG tablet Take 1 tablet (5 mg total) by mouth daily. 06/28/18   Zara Council A, PA-C  furosemide (LASIX) 20 MG tablet  04/26/18   [provider]  isosorbide mononitrate (IMDUR) 30 MG 24 hr tablet Take 30 mg by mouth at bedtime.  11/22/16   [provider]  memantine (NAMENDA) 10 MG tablet Take 10 mg by mouth daily.    [provider]  mirtazapine (REMERON) 15 MG tablet  04/17/18   [provider]  Multiple Vitamin (MULTIVITAMIN WITH MINERALS) TABS tablet Take 1 tablet by mouth daily.    [provider]  rosuvastatin (CRESTOR) 20 MG tablet Take 20 mg by mouth at bedtime.  12/27/16   [provider]  Spacer/Aero-Holding Chambers (AEROCHAMBER MV) inhaler Use as instructed 12/13/16   Merlyn Lot, MD  tamsulosin (FLOMAX) 0.4 MG CAPS capsule TAKE ONE CAPSULE BY MOUTH EVERY DAY 07/03/18   McGowan, Larene Beach A, PA-C  tamsulosin (FLOMAX) 0.4 MG CAPS capsule Take 1 capsule (0.4 mg total) by mouth daily. 06/28/18   Zara Council A, PA-C  vitamin B-12 (CYANOCOBALAMIN) 1000 MCG tablet Take 1,000 mcg by mouth daily.    [provider]    Allergies  Allergen Reactions  . Clindamycin Hcl     Other reaction(s): Unknown Reaction:  Unknown   . Clindamycin/Lincomycin Other (See Comments)    Reaction:  Unknown   . Sulfamethoxazole-Trimethoprim Rash    Family History  Problem Relation Age of Onset  . Prostate cancer Brother   . Kidney disease Neg Hx   . Bladder Cancer Neg Hx     Social History Social History   Tobacco Use  . Smoking status:  Former Smoker    Packs/day: 0.50    Types: Cigarettes  . Smokeless tobacco: Never Used  . Tobacco comment: quit 40 years  Substance Use Topics  . Alcohol use: No    Alcohol/week: 0.0 standard drinks  . Drug use: No    Review of Systems per patient and daughter. Constitutional: Negative for fever. ENT: Negative for recent illness/congestion Cardiovascular: Negative for chest pain. Respiratory: Negative for shortness of breath. Gastrointestinal: Negative for abdominal pain, vomiting and diarrhea. Genitourinary: Negative for urinary compaints Musculoskeletal: Negative for musculoskeletal complaints Skin: Negative for skin complaints  Neurological: Negative for headache All other ROS negative, although possibly limited by mild baseline dementia.  ____________________________________________   PHYSICAL EXAM:  VITAL SIGNS: ED Triage Vitals  Enc Vitals Group     BP 09/25/18 0859 113/70     Pulse Rate  09/25/18 0859 63     Resp 09/25/18 0859 16     Temp 09/25/18 0859 98.4 F (36.9 C)     Temp Source 09/25/18 0859 Oral     SpO2 09/25/18 0859 96 %     Weight 09/25/18 0859 184 lb (83.5 kg)     Height 09/25/18 0859 6' (1.829 m)     Head Circumference --      Peak Flow --      Pain Score 09/25/18 0903 0     Pain Loc --      Pain Edu? --      Excl. in [place]? --    Constitutional: Alert, oriented to person, place but not time.  Daughter states this is baseline.. Well appearing and in no distress. Eyes: Normal exam ENT   Head: Normocephalic and atraumatic.   Mouth/Throat: Mucous membranes are moist. Cardiovascular: Normal rate, regular rhythm.  Respiratory: Normal respiratory effort without tachypnea nor retractions. Breath sounds are clear  Gastrointestinal: Soft and nontender. No distention.  Musculoskeletal: Nontender with normal range of motion in all extremities.  Neurologic:  Normal speech and language. No gross focal neurologic deficits  Skin:  Skin is warm, dry and  intact.  Psychiatric: Mood and affect are normal.  ____________________________________________   INITIAL IMPRESSION / ASSESSMENT AND PLAN / ED COURSE  Pertinent labs & imaging results that were available during my care of the patient were reviewed by me and considered in my medical decision making (see chart for details).  Patient presents to the emergency department for increased confusion and restlessness over the past several days.  Daughter states the patient's Coreg was recently increased 1 week ago as the only new medication change.  Denies any recent fever cough congestion vomiting or diarrhea no recent illness.  No medical complaints currently.  Daughter states the patient has had urinary tract infections in the past with similar symptoms.  Reassuringly patient's lab work is largely within normal limits, urinalysis is pending at this time.  Patient's urinalysis consistent with urinary tract infection.  We will dose IV Rocephin while we have the patient in the emergency department.  Urine culture has been added onto the patient's urinalysis.  We will discharge on Keflex.  I discussed this plan of care with the patient and daughter they are agreeable to this plan of care.  ____________________________________________   FINAL CLINICAL IMPRESSION(S) / ED DIAGNOSES  Confusion Urinary tract infection   Harvest Dark, MD 09/25/18 1315

## 2018-09-25 NOTE — ED Notes (Signed)
First Nurse Note: Patient sitting with daughter in [place], no new complaints verbalized.

## 2018-09-25 NOTE — ED Notes (Signed)
Pt alert and oriented X4, active, cooperative, pt in NAD. RR even and unlabored, color WNL.  Pt informed to return if any life threatening symptoms occur.  Discharge and followup instructions reviewed. Ambulates safely. 

## 2018-09-25 NOTE — ED Notes (Signed)
Report from [place], [place]. Care assumed by this RN

## 2018-09-25 NOTE — ED Notes (Signed)
First Nurse Note: Patient to Rm 14, [place] aware of room placement.

## 2018-09-25 NOTE — ED Notes (Signed)
ED Provider at bedside. 

## 2018-09-25 NOTE — ED Notes (Signed)
Blue top tube sent to lab as well. 

## 2018-09-25 NOTE — ED Notes (Signed)
First Nurse Note: Patient to ED via [facility] from [facility] with report of altered mental status since yesterday PM.  Daughter states patient was looking for something that wasn't there.

## 2018-09-25 NOTE — ED Triage Notes (Signed)
Pts daughter states he has been confused since yesterday, was restless last pm, answers questions appropriately in triage, however, unable to answer day of the week or year, oriented to self and place. Daughter states he does have dementia, had an increase in [place] last week and wonders if that has caused the increase in confusion. Pt in NAD.

## 2018-09-27 LAB — URINE CULTURE: Culture: >=100000 — AB

## 2018-10-12 ENCOUNTER — Other Ambulatory Visit: Payer: Self-pay | Admitting: Acute Care

## 2018-10-12 DIAGNOSIS — R410 Disorientation, unspecified: Secondary | ICD-10-CM

## 2018-10-13 ENCOUNTER — Encounter: Payer: Self-pay | Admitting: Urology

## 2018-10-13 ENCOUNTER — Ambulatory Visit: Payer: Medicare HMO | Admitting: Urology

## 2018-10-13 VITALS — BP 74/50 | HR 67 | Ht 72.0 in | Wt 183.0 lb

## 2018-10-13 DIAGNOSIS — N138 Other obstructive and reflux uropathy: Secondary | ICD-10-CM

## 2018-10-13 DIAGNOSIS — N401 Enlarged prostate with lower urinary tract symptoms: Secondary | ICD-10-CM

## 2018-10-13 DIAGNOSIS — R310 Gross hematuria: Secondary | ICD-10-CM

## 2018-10-13 NOTE — Progress Notes (Signed)
10/13/2018 3:48 PM   Chris Simon Chris Simon 11-04-1933 [id number]  Referring provider: [name], MD [address] [place], [place] [address]  Chief Complaint  Patient presents with  . Hematuria    follow up    HPI: 85 year old male with dementia followed for history of kidney stones, massive prostatomegaly with bladder outlet obstruction on Flomax/finasteride who returns the office today sooner than expected due to concerns for gross hematuria.  Please see previous notes for past urologic history.  His family reports today that he has had several episodes of painless gross hematuria.  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  His symptoms may be exacerbated by his chronic Eliquis and aspirin.  No history of urinary retention.  No clots.  Hematuria seems to subsided.  He is not complain of this.  History is provided by family today.   PMH: Past Medical History:  Diagnosis Date  . Aneurysm ([place])    Abdominal Aortic Aneurysm  . Atrial fibrillation ([place])   . Blockage of a bile duct   . BPH with obstruction/lower urinary tract symptoms   . Cellulitis of scrotum   . COPD (chronic obstructive pulmonary disease) ([place])   . Dementia ([place])   . Diverticulosis   . Dyspnea    with exertion  . Dysrhythmia   . Elevated PSA   . Epididymitis   . Gallstone   . Gross hematuria   . History of kidney stones   . Hyperlipidemia   . Kidney stones   . Nocturia   . Testicular pain   . TIA (transient ischemic attack)    June 2018  . Urinary retention     Surgical History: Past Surgical History:  Procedure Laterality Date  . CHOLECYSTECTOMY N/A 07/11/2015   Procedure: LAPAROSCOPIC CHOLECYSTECTOMY;  Surgeon: Rolm Bookbinder, MD;  Location: [place];  Service: General;  Laterality: N/A;  . ERCP N/A 07/09/2015   Procedure:  ENDOSCOPIC RETROGRADE CHOLANGIOPANCREATOGRAPHY (ERCP);  Surgeon: Clarene Essex, MD;  Location: [facility] ENDOSCOPY;  Service: Endoscopy;  Laterality: N/A;  . INGUINAL HERNIA REPAIR Left 04/07/2017   Procedure: HERNIA REPAIR INGUINAL ADULT;  Surgeon: Clayburn Pert, MD;  Location: [facility] ORS;  Service: General;  Laterality: Left;  . INSERTION OF MESH Left 04/07/2017   Procedure: INSERTION OF MESH;  Surgeon: Clayburn Pert, MD;  Location: [facility] ORS;  Service: General;  Laterality: Left;  . LEFT HEART CATH AND CORONARY ANGIOGRAPHY N/A 01/10/2018   Procedure: LEFT HEART CATH AND CORONARY ANGIOGRAPHY;  Surgeon: Dionisio David, MD;  Location: [place] [facility];  Service: Cardiovascular;  Laterality: N/A;    Home Medications:  Allergies as of 10/13/2018      Reactions   Clindamycin Hcl    Other reaction(s): Unknown Reaction:  Unknown    Clindamycin/lincomycin Other (See Comments)   Reaction:  Unknown    Sulfamethoxazole-trimethoprim Rash      Medication List       Accurate as of October 13, 2018  3:48 PM. Always use your most recent med list.        AEROCHAMBER MV inhaler Use as instructed   albuterol 108 (90 Base) MCG/ACT inhaler Commonly known as:  PROVENTIL HFA;VENTOLIN HFA Inhale 2 puffs into the lungs every 6 (six) hours as needed for wheezing or shortness of breath.   amiodarone 200 MG tablet Commonly known as:  PACERONE Take 200  mg by mouth at bedtime.   apixaban 5 MG Tabs tablet Commonly known as:  ELIQUIS Take 1 tablet (5 mg total) by mouth 2 (two) times daily.   aspirin EC 325 MG tablet Take 325 mg by mouth daily.   carvedilol 3.125 MG tablet Commonly known as:  COREG   Cranberry 250 MG Caps Take 250 mg by mouth daily.   donepezil 5 MG tablet Commonly known as:  ARICEPT Take 5 mg by mouth at bedtime.   enalapril 2.5 MG tablet Commonly known as:  VASOTEC   finasteride 5 MG tablet Commonly known as:  PROSCAR Take 1 tablet (5 mg total) by mouth daily.   furosemide  20 MG tablet Commonly known as:  LASIX   isosorbide mononitrate 30 MG 24 hr tablet Commonly known as:  IMDUR Take 30 mg by mouth at bedtime.   memantine 10 MG tablet Commonly known as:  NAMENDA Take 10 mg by mouth daily.   mirtazapine 15 MG tablet Commonly known as:  REMERON   multivitamin with minerals Tabs tablet Take 1 tablet by mouth daily.   QUEtiapine 25 MG tablet Commonly known as:  SEROQUEL   rosuvastatin 20 MG tablet Commonly known as:  CRESTOR Take 20 mg by mouth at bedtime.   tamsulosin 0.4 MG Caps capsule Commonly known as:  FLOMAX Take 1 capsule (0.4 mg total) by mouth daily.   vitamin B-12 1000 MCG tablet Commonly known as:  CYANOCOBALAMIN Take 1,000 mcg by mouth daily.       Allergies:  Allergies  Allergen Reactions  . Clindamycin Hcl     Other reaction(s): Unknown Reaction:  Unknown   . Clindamycin/Lincomycin Other (See Comments)    Reaction:  Unknown   . Sulfamethoxazole-Trimethoprim Rash    Family History: Family History  Problem Relation Age of Onset  . Prostate cancer Brother   . Kidney disease Neg Hx   . Bladder Cancer Neg Hx     Social History:  reports that he has quit smoking. His smoking use included cigarettes. He smoked 0.50 packs per day. He has never used smokeless tobacco. He reports that he does not drink alcohol or use drugs.  ROS: UROLOGY Frequent Urination?: No Hard to postpone urination?: No Burning/pain with urination?: No Get up at night to urinate?: No Leakage of urine?: No Urine stream starts and stops?: No Trouble starting stream?: No Do you have to strain to urinate?: No Blood in urine?: Yes Urinary tract infection?: No Sexually transmitted disease?: No Injury to kidneys or bladder?: No Painful intercourse?: No Weak stream?: No Erection problems?: No Penile pain?: No  Gastrointestinal Nausea?: No Vomiting?: No Indigestion/heartburn?: No Diarrhea?: No Constipation?: No  Constitutional Fever:  No Night sweats?: No Weight loss?: Yes Fatigue?: No  Skin Skin rash/lesions?: No Itching?: No  Eyes Blurred vision?: No Double vision?: No  Ears/Nose/Throat Sore throat?: No Sinus problems?: No  Hematologic/Lymphatic Swollen glands?: No Easy bruising?: Yes  Cardiovascular Leg swelling?: No Chest pain?: No  Respiratory Cough?: No Shortness of breath?: No  Endocrine Excessive thirst?: No  Musculoskeletal Back pain?: Yes Joint pain?: No  Neurological Headaches?: No Dizziness?: No  Psychologic Depression?: No Anxiety?: No  Physical Exam: BP (!) 74/50   Pulse 67   Ht 6' (1.829 m)   Wt 183 lb (83 kg)   BMI 24.82 kg/m   Constitutional:  Alert and oriented, No acute distress. HEENT: [place] AT, moist mucus membranes.  Trachea midline, no masses. Cardiovascular: No clubbing, cyanosis, or edema. Respiratory:  Normal respiratory effort, no increased work of breathing. GI: Abdomen is soft, nontender, nondistended, no abdominal masses GU: No CVA tenderness Lymph: No cervical or inguinal lymphadenopathy. Skin: No rashes, bruises or suspicious lesions. Neurologic: Grossly intact, no focal deficits, moving all 4 extremities. Psychiatric: Normal mood and affect.  Laboratory Data: Lab Results  Component Value Date   WBC 7.2 09/25/2018   HGB 14.1 09/25/2018   HCT 44.3 09/25/2018   MCV 95.7 09/25/2018   PLT 186 09/25/2018    Lab Results  Component Value Date   CREATININE 1.26 (H) 09/25/2018    Lab Results  Component Value Date   HGBA1C 5.9 (H) 02/19/2017    Urinalysis    Component Value Date/Time   COLORURINE YELLOW (A) 09/25/2018 1042   APPEARANCEUR CLOUDY (A) 09/25/2018 1042   APPEARANCEUR Cloudy (A) 05/02/2018 1439   LABSPEC 1.016 09/25/2018 1042   LABSPEC 1.019 06/21/2014 1800   PHURINE 5.0 09/25/2018 1042   GLUCOSEU NEGATIVE 09/25/2018 1042   GLUCOSEU Negative 06/21/2014 1800   HGBUR SMALL (A) 09/25/2018 1042   BILIRUBINUR NEGATIVE 09/25/2018  1042   BILIRUBINUR Negative 05/02/2018 1439   BILIRUBINUR Negative 06/21/2014 1800   KETONESUR NEGATIVE 09/25/2018 1042   PROTEINUR NEGATIVE 09/25/2018 1042   NITRITE NEGATIVE 09/25/2018 1042   LEUKOCYTESUR LARGE (A) 09/25/2018 1042   LEUKOCYTESUR 2+ (A) 05/02/2018 1439   LEUKOCYTESUR Trace 06/21/2014 1800    Lab Results  Component Value Date   LABMICR See below: 05/02/2018   WBCUA 11-30 (A) 05/02/2018   RBCUA 0-2 05/02/2018   LABEPIT 0-10 05/02/2018   MUCUS Present (A) 05/02/2018   BACTERIA NONE SEEN 09/25/2018    Pertinent Imaging: n/a  Assessment & Plan:    1. BPH with obstruction/lower urinary tract symptoms Continue Flomax and finasteride Return as recommended for IPSS/PVR at 1 year interval  2. Gross hematuria Episodes of painless gross hematuria, multiple GU issues which may be underlying cause I have recommended consideration of an office cystoscopy which was previously well-tolerated to rule out any underlying bladder pathology/bladder stones and reassessment of his prostate anatomy Depending on these results, may consider repeat upper tract imaging He is family agree with this plan   Return in about 3 weeks (around 11/03/2018) for cysto.  Hollice Espy, MD  [facility] [address] [place], [place] [address] [phone]

## 2018-10-14 ENCOUNTER — Ambulatory Visit
Admission: RE | Admit: 2018-10-14 | Discharge: 2018-10-14 | Disposition: A | Discharge status: Home / Self Care | Payer: Medicare HMO | Source: Ambulatory Visit | Attending: Acute Care | Admitting: Acute Care

## 2018-10-14 DIAGNOSIS — R410 Disorientation, unspecified: Secondary | ICD-10-CM | POA: Diagnosis not present

## 2018-10-23 ENCOUNTER — Ambulatory Visit: Payer: Medicare HMO

## 2018-11-02 ENCOUNTER — Other Ambulatory Visit: Payer: Self-pay

## 2018-11-02 DIAGNOSIS — R310 Gross hematuria: Secondary | ICD-10-CM

## 2018-11-03 ENCOUNTER — Other Ambulatory Visit: Payer: Medicare HMO | Admitting: Urology

## 2018-11-03 DIAGNOSIS — K5790 Diverticulosis of intestine, part unspecified, without perforation or abscess without bleeding: Secondary | ICD-10-CM | POA: Insufficient documentation

## 2018-11-14 ENCOUNTER — Ambulatory Visit
Admission: RE | Admit: 2018-11-14 | Discharge: 2018-11-14 | Disposition: A | Discharge status: Home / Self Care | Payer: Medicare HMO | Source: Ambulatory Visit | Attending: Orthopedic Surgery | Admitting: Orthopedic Surgery

## 2018-11-14 ENCOUNTER — Other Ambulatory Visit: Payer: Self-pay | Admitting: Orthopedic Surgery

## 2018-11-14 DIAGNOSIS — S22000A Wedge compression fracture of unspecified thoracic vertebra, initial encounter for closed fracture: Secondary | ICD-10-CM

## 2018-11-16 ENCOUNTER — Ambulatory Visit: Payer: Medicare HMO

## 2018-11-16 ENCOUNTER — Ambulatory Visit
Admission: RE | Admit: 2018-11-16 | Discharge: 2018-11-16 | Disposition: A | Discharge status: Home / Self Care | Payer: Medicare HMO | Attending: Orthopedic Surgery | Admitting: Orthopedic Surgery

## 2018-11-16 ENCOUNTER — Other Ambulatory Visit: Payer: Self-pay

## 2018-11-16 ENCOUNTER — Encounter: Payer: Self-pay | Admitting: *Deleted

## 2018-11-16 ENCOUNTER — Encounter
Admission: RE | Disposition: A | Discharge status: Home / Self Care | Payer: Self-pay | Source: Home / Self Care | Attending: Orthopedic Surgery

## 2018-11-16 ENCOUNTER — Ambulatory Visit: Payer: Medicare HMO | Admitting: Certified Registered"

## 2018-11-16 DIAGNOSIS — Z8673 Personal history of transient ischemic attack (TIA), and cerebral infarction without residual deficits: Secondary | ICD-10-CM | POA: Diagnosis not present

## 2018-11-16 DIAGNOSIS — N401 Enlarged prostate with lower urinary tract symptoms: Secondary | ICD-10-CM | POA: Insufficient documentation

## 2018-11-16 DIAGNOSIS — N138 Other obstructive and reflux uropathy: Secondary | ICD-10-CM | POA: Insufficient documentation

## 2018-11-16 DIAGNOSIS — X58XXXA Exposure to other specified factors, initial encounter: Secondary | ICD-10-CM | POA: Diagnosis not present

## 2018-11-16 DIAGNOSIS — Z79899 Other long term (current) drug therapy: Secondary | ICD-10-CM | POA: Insufficient documentation

## 2018-11-16 DIAGNOSIS — Z419 Encounter for procedure for purposes other than remedying health state, unspecified: Secondary | ICD-10-CM

## 2018-11-16 DIAGNOSIS — J449 Chronic obstructive pulmonary disease, unspecified: Secondary | ICD-10-CM | POA: Insufficient documentation

## 2018-11-16 DIAGNOSIS — Z882 Allergy status to sulfonamides status: Secondary | ICD-10-CM | POA: Diagnosis not present

## 2018-11-16 DIAGNOSIS — F0390 Unspecified dementia without behavioral disturbance: Secondary | ICD-10-CM | POA: Insufficient documentation

## 2018-11-16 DIAGNOSIS — Z87442 Personal history of urinary calculi: Secondary | ICD-10-CM | POA: Diagnosis not present

## 2018-11-16 DIAGNOSIS — E785 Hyperlipidemia, unspecified: Secondary | ICD-10-CM | POA: Diagnosis not present

## 2018-11-16 DIAGNOSIS — S22080A Wedge compression fracture of T11-T12 vertebra, initial encounter for closed fracture: Secondary | ICD-10-CM | POA: Insufficient documentation

## 2018-11-16 DIAGNOSIS — I4891 Unspecified atrial fibrillation: Secondary | ICD-10-CM | POA: Insufficient documentation

## 2018-11-16 DIAGNOSIS — Z87891 Personal history of nicotine dependence: Secondary | ICD-10-CM | POA: Insufficient documentation

## 2018-11-16 DIAGNOSIS — Z881 Allergy status to other antibiotic agents status: Secondary | ICD-10-CM | POA: Insufficient documentation

## 2018-11-16 DIAGNOSIS — Z7901 Long term (current) use of anticoagulants: Secondary | ICD-10-CM | POA: Diagnosis not present

## 2018-11-16 DIAGNOSIS — I10 Essential (primary) hypertension: Secondary | ICD-10-CM | POA: Insufficient documentation

## 2018-11-16 HISTORY — PX: KYPHOPLASTY: SHX5884

## 2018-11-16 SURGERY — KYPHOPLASTY
Anesthesia: General

## 2018-11-16 MED ORDER — LIDOCAINE HCL 1 % IJ SOLN
INTRAMUSCULAR | Status: DC | PRN
  Administered 2018-11-16: 20 mL

## 2018-11-16 MED ORDER — HYDROCODONE-ACETAMINOPHEN 5-325 MG PO TABS: 1.0000 | ORAL_TABLET | Freq: Four times a day (QID) | ORAL | 0 refills | Status: DC | PRN

## 2018-11-16 MED ORDER — PROPOFOL 500 MG/50ML IV EMUL
INTRAVENOUS | Status: DC | PRN
  Administered 2018-11-16: 50 ug/kg/min via INTRAVENOUS

## 2018-11-16 MED ORDER — LIDOCAINE 2% (20 MG/ML) 5 ML SYRINGE
INTRAMUSCULAR | Status: DC | PRN
  Administered 2018-11-16: 25 mg via INTRAVENOUS

## 2018-11-16 MED ORDER — PROPOFOL 10 MG/ML IV BOLUS
INTRAVENOUS | Status: DC | PRN
  Administered 2018-11-16: 20 mg via INTRAVENOUS
  Administered 2018-11-16: 30 mg via INTRAVENOUS

## 2018-11-16 MED ORDER — KETAMINE HCL 50 MG/ML IJ SOLN
INTRAMUSCULAR | Status: DC | PRN
  Administered 2018-11-16: 15 mg via INTRAVENOUS
  Administered 2018-11-16: 25 mg via INTRAMUSCULAR
  Administered 2018-11-16: 10 mg via INTRAVENOUS

## 2018-11-16 MED ORDER — CEFAZOLIN SODIUM-DEXTROSE 2-4 GM/100ML-% IV SOLN
2.0000 g | Freq: Once | INTRAVENOUS | Status: AC
  Administered 2018-11-16: 2 g via INTRAVENOUS

## 2018-11-16 MED ORDER — ONDANSETRON HCL 4 MG/2ML IJ SOLN: 4.0000 mg | Freq: Once | INTRAMUSCULAR | Status: DC | PRN

## 2018-11-16 MED ORDER — FAMOTIDINE 20 MG PO TABS
ORAL_TABLET | ORAL | Status: AC
  Administered 2018-11-16: 20 mg via ORAL
  Filled 2018-11-16: qty 1

## 2018-11-16 MED ORDER — LACTATED RINGERS IV SOLN
INTRAVENOUS | Status: DC
  Administered 2018-11-16: 12:00:00 via INTRAVENOUS

## 2018-11-16 MED ORDER — LIDOCAINE HCL (PF) 1 % IJ SOLN
INTRAMUSCULAR | Status: AC
  Filled 2018-11-16: qty 30

## 2018-11-16 MED ORDER — BUPIVACAINE-EPINEPHRINE (PF) 0.5% -1:200000 IJ SOLN
INTRAMUSCULAR | Status: AC
  Filled 2018-11-16: qty 30

## 2018-11-16 MED ORDER — BUPIVACAINE-EPINEPHRINE (PF) 0.5% -1:200000 IJ SOLN
INTRAMUSCULAR | Status: DC | PRN
  Administered 2018-11-16: 10 mL

## 2018-11-16 MED ORDER — IOPAMIDOL (ISOVUE-M 200) INJECTION 41%
INTRAMUSCULAR | Status: AC
  Filled 2018-11-16: qty 10

## 2018-11-16 MED ORDER — CEFAZOLIN SODIUM-DEXTROSE 2-4 GM/100ML-% IV SOLN
INTRAVENOUS | Status: AC
  Filled 2018-11-16: qty 100

## 2018-11-16 MED ORDER — FAMOTIDINE 20 MG PO TABS
20.0000 mg | ORAL_TABLET | Freq: Once | ORAL | Status: AC
  Administered 2018-11-16: 20 mg via ORAL

## 2018-11-16 MED ORDER — FENTANYL CITRATE (PF) 100 MCG/2ML IJ SOLN: 25.0000 ug | INTRAMUSCULAR | Status: DC | PRN

## 2018-11-16 SURGICAL SUPPLY — 20 items
CEMENT KYPHON CX01A KIT/MIXER (Cement) ×3 IMPLANT
COVER WAND RF STERILE (DRAPES) ×3 IMPLANT
DERMABOND ADVANCED (GAUZE/BANDAGES/DRESSINGS) ×2
DERMABOND ADVANCED .7 DNX12 (GAUZE/BANDAGES/DRESSINGS) ×1 IMPLANT
DEVICE BIOPSY BONE KYPHX (INSTRUMENTS) ×3 IMPLANT
DRAPE C-ARM XRAY 36X54 (DRAPES) ×3 IMPLANT
DURAPREP 26ML APPLICATOR (WOUND CARE) ×3 IMPLANT
FEE RENTAL RFA GENERATOR (MISCELLANEOUS) IMPLANT
GLOVE SURG SYN 9.0  PF PI (GLOVE) ×2
GLOVE SURG SYN 9.0 PF PI (GLOVE) ×1 IMPLANT
GOWN SRG 2XL LVL 4 RGLN SLV (GOWNS) ×1 IMPLANT
GOWN STRL NON-REIN 2XL LVL4 (GOWNS) ×2
GOWN STRL REUS W/ TWL LRG LVL3 (GOWN DISPOSABLE) ×1 IMPLANT
GOWN STRL REUS W/TWL LRG LVL3 (GOWN DISPOSABLE) ×2
PACK KYPHOPLASTY (MISCELLANEOUS) ×3 IMPLANT
RENTAL RFA  GENERATOR (MISCELLANEOUS)
RENTAL RFA GENERATOR (MISCELLANEOUS) IMPLANT
STRAP SAFETY 5IN WIDE (MISCELLANEOUS) ×3 IMPLANT
TRAY KYPHOPAK 15/3 EXPRESS 1ST (MISCELLANEOUS) ×1 IMPLANT
TRAY KYPHOPAK 20/3 EXPRESS 1ST (MISCELLANEOUS) ×3 IMPLANT

## 2018-11-16 NOTE — Transfer of Care (Signed)
Immediate Anesthesia Transfer of Care Note  Patient: Chris Simon  Procedure(s) Performed: KYPHOPLASTY T12 (N/A )  Patient Location: PACU  Anesthesia Type:General  Level of Consciousness: awake  Airway & Oxygen Therapy: Patient Spontanous Breathing  Post-op Assessment: Report given to RN and Post -op Vital signs reviewed and stable  Post vital signs: Reviewed  Last Vitals:  Vitals Value Taken Time  BP 137/110 11/16/2018 12:54 PM  Temp    Pulse 60 11/16/2018 12:56 PM  Resp 13 11/16/2018 12:56 PM  SpO2 100 % 11/16/2018 12:56 PM  Vitals shown include unvalidated device data.  Last Pain:  Vitals:   11/16/18 1152  TempSrc: Temporal  PainSc:          Complications: No apparent anesthesia complications

## 2018-11-16 NOTE — Anesthesia Postprocedure Evaluation (Signed)
Anesthesia Post Note  Patient: Chris Simon  Procedure(s) Performed: KYPHOPLASTY T12 (N/A )  Patient location during evaluation: PACU Anesthesia Type: General Level of consciousness: awake and alert Pain management: pain level controlled Vital Signs Assessment: post-procedure vital signs reviewed and stable Respiratory status: spontaneous breathing and respiratory function stable Cardiovascular status: stable Anesthetic complications: no     Last Vitals:  Vitals:   11/16/18 1336 11/16/18 1339  BP:  (!) 132/56  Pulse: (!) 50 (!) 48  Resp: 18 14  Temp:    SpO2: 95% 96%    Last Pain:  Vitals:   11/16/18 1339  TempSrc:   PainSc: 0-No pain                 KEPHART,WILLIAM K

## 2018-11-16 NOTE — Op Note (Signed)
Date 11/16/2018  Time 12:55 pm   PATIENT:  Chris Simon   PRE-OPERATIVE DIAGNOSIS:  closed wedge compression fracture of T12   POST-OPERATIVE DIAGNOSIS:  closed wedge compression fracture of T12   PROCEDURE:  Procedure(s): KYPHOPLASTY T12  SURGEON: Laurene Footman, MD   ASSISTANTS: None   ANESTHESIA:   local and MAC   EBL:  No intake/output data recorded.   BLOOD ADMINISTERED:none   DRAINS: none    LOCAL MEDICATIONS USED:  MARCAINE    and XYLOCAINE    SPECIMEN:   T12 vertebral body biopsy   DISPOSITION OF SPECIMEN:  Pathology   COUNTS:  YES   TOURNIQUET:  * No tourniquets in log *   IMPLANTS: Bone cement   DICTATION: .Dragon Dictation  patient was brought to the operating room and after adequate anesthesia was obtained the patient was placed prone.  C arm was brought in in good visualization of the affected level obtained on both AP and lateral projections.  After patient identification and timeout procedures were completed, local anesthetic was infiltrated with 10 cc 1% Xylocaine infiltrated subcutaneously.  This is done the area on the right side of the planned approach.  The back was then prepped and draped in the usual sterile fashion sterile manner and repeat timeout procedure carried out.  A spinal needle was brought down to the pedicle on the right side of  T12 and a 50-50 mix of 1% Xylocaine half percent Sensorcaine with epinephrine total of 20 cc injected.  After allowing this to set a small incision was made and the trocar was advanced into the vertebral body in an extrapedicular fashion.  Biopsy was obtained Drilling was carried out balloon inserted with inflation to  3 cc.  When the cement was appropriate consistency for cc were injected into the vertebral body with small amount of extravasation into the T11-T12 disc space, good fill superior to inferior endplates and from right to left sides along the inferior endplate.  After the cement had set the trochar was removed  and permanent C-arm views obtained.  The wound was closed with Dermabond followed by Band-Aid   PLAN OF CARE: Discharge to home after PACU   PATIENT DISPOSITION:  PACU - hemodynamically stable.

## 2018-11-16 NOTE — Discharge Instructions (Addendum)
Take it easy today and tomorrow then resume more normal activities Saturday.  Pain medicine as directed.  Remove Band-Aid on Saturday then okay to shower.   AMBULATORY SURGERY  DISCHARGE INSTRUCTIONS   1) The drugs that you were given will stay in your system until tomorrow so for the next 24 hours you should not:  A) Drive an automobile B) Make any legal decisions C) Drink any alcoholic beverage   2) You may resume regular meals tomorrow.  Today it is better to start with liquids and gradually work up to solid foods.  You may eat anything you prefer, but it is better to start with liquids, then soup and crackers, and gradually work up to solid foods.   3) Please notify your doctor immediately if you have any unusual bleeding, trouble breathing, redness and pain at the surgery site, drainage, fever, or pain not relieved by medication.    4) Additional Instructions:        Please contact your physician with any problems or Same Day Surgery at [phone], Monday through Friday 6 am to 4 pm, or [place] at [facility] number at [phone].

## 2018-11-16 NOTE — H&P (Signed)
Reviewed paper H+P, will be scanned into chart. No changes noted.  

## 2018-11-16 NOTE — Anesthesia Post-op Follow-up Note (Signed)
Anesthesia QCDR form completed.        

## 2018-11-16 NOTE — Anesthesia Preprocedure Evaluation (Signed)
Anesthesia Evaluation  Patient identified by MRN, date of birth, ID band Patient awake    Reviewed: Allergy & Precautions, NPO status , Patient's Chart, lab work & pertinent test results, reviewed documented beta blocker date and time   History of Anesthesia Complications Negative for: history of anesthetic complications  Airway Mallampati: II       Dental  (+) Upper Dentures   Pulmonary neg sleep apnea, COPD,  COPD inhaler, former smoker,           Cardiovascular hypertension, Pt. on medications (-) Past MI and (-) CHF + dysrhythmias Atrial Fibrillation (-) Valvular Problems/Murmurs     Neuro/Psych neg Seizures PSYCHIATRIC DISORDERS (Pt with memory problems, not oreinted to year) Dementia TIA   GI/Hepatic Neg liver ROS, neg GERD  ,  Endo/Other  neg diabetes  Renal/GU Renal disease (stones)     Musculoskeletal   Abdominal   Peds  Hematology   Anesthesia Other Findings   Reproductive/Obstetrics                             Anesthesia Physical Anesthesia Plan  ASA: III  Anesthesia Plan: General   Post-op Pain Management:    Induction: Intravenous  PONV Risk Score and Plan: 2 and Propofol infusion and TIVA  Airway Management Planned: Nasal Cannula  Additional Equipment:   Intra-op Plan:   Post-operative Plan:   Informed Consent: I have reviewed the patients History and Physical, chart, labs and discussed the procedure including the risks, benefits and alternatives for the proposed anesthesia with the patient or authorized representative who has indicated his/her understanding and acceptance.       Plan Discussed with:   Anesthesia Plan Comments:         Anesthesia Quick Evaluation

## 2018-11-17 ENCOUNTER — Encounter: Payer: Self-pay | Admitting: Orthopedic Surgery

## 2018-11-17 LAB — SURGICAL PATHOLOGY

## 2019-04-09 ENCOUNTER — Ambulatory Visit
Admission: EM | Admit: 2019-04-09 | Discharge: 2019-04-09 | Disposition: A | Discharge status: Home / Self Care | Payer: Medicare HMO | Attending: Family Medicine | Admitting: Family Medicine

## 2019-04-09 ENCOUNTER — Other Ambulatory Visit: Payer: Self-pay

## 2019-04-09 DIAGNOSIS — I10 Essential (primary) hypertension: Secondary | ICD-10-CM | POA: Insufficient documentation

## 2019-04-09 DIAGNOSIS — J441 Chronic obstructive pulmonary disease with (acute) exacerbation: Secondary | ICD-10-CM

## 2019-04-09 DIAGNOSIS — R410 Disorientation, unspecified: Secondary | ICD-10-CM | POA: Diagnosis not present

## 2019-04-09 DIAGNOSIS — R0902 Hypoxemia: Secondary | ICD-10-CM

## 2019-04-09 NOTE — ED Provider Notes (Signed)
[facility]    CSN: [id number] Arrival date & time: 04/09/19  9147     History   Chief Complaint Chief Complaint  Patient presents with  . Shortness of Breath    HPI Chris Simon is a 85 y.o. male.   85 yo male with a h/o COPD presents with a c/o cough, shortness of breath and increasing/worsening confusion (per daughter) over the last 2-3 weeks. Cough is non-productive. Daughter has also noticed increased work of breathing, wheezing and O2 saturations at home have been between 90-92. Patient does not have or use home oxygen. Has been using his inhalers without relief.  Denies any chest pains or fevers.    Shortness of Breath   Past Medical History:  Diagnosis Date  . Aneurysm ([place])    Abdominal Aortic Aneurysm  . Atrial fibrillation ([place])   . Blockage of a bile duct   . BPH with obstruction/lower urinary tract symptoms   . Cellulitis of scrotum   . COPD (chronic obstructive pulmonary disease) ([place])   . Dementia ([place])   . Diverticulosis   . Dyspnea    with exertion  . Dysrhythmia   . Elevated PSA   . Epididymitis   . Gallstone   . Gross hematuria   . History of kidney stones   . Hyperlipidemia   . Kidney stones   . Nocturia   . Testicular pain   . TIA (transient ischemic attack)    June 2018  . Urinary retention     Patient Active Problem List   Diagnosis Date Noted  . Pneumonia 02/20/2017  . TIA (transient ischemic attack) 02/18/2017  . Left inguinal hernia 06/22/2016  . Umbilical hernia without obstruction and without gangrene 06/22/2016  . Calculus of kidney 09/03/2015  . Testicular cyst 09/03/2015  . Sepsis secondary to UTI ([place]) 07/20/2015  . BPH (benign prostatic hyperplasia) 07/18/2015  . Elevated PSA 07/18/2015  . Sepsis ([place]) 07/09/2015  . Acute kidney injury ([place])   . Paroxysmal atrial fibrillation ([facility])   . UTI (lower urinary tract infection)   . Atrial fibrillation ([place]) 10/01/2014  . Pure hypercholesterolemia  10/01/2014  . Benign fibroma of prostate 10/06/2012  . Bladder retention 10/06/2012    Past Surgical History:  Procedure Laterality Date  . CHOLECYSTECTOMY N/A 07/11/2015   Procedure: LAPAROSCOPIC CHOLECYSTECTOMY;  Surgeon: Rolm Bookbinder, MD;  Location: [place];  Service: General;  Laterality: N/A;  . ERCP N/A 07/09/2015   Procedure: ENDOSCOPIC RETROGRADE CHOLANGIOPANCREATOGRAPHY (ERCP);  Surgeon: Clarene Essex, MD;  Location: [facility] ENDOSCOPY;  Service: Endoscopy;  Laterality: N/A;  . INGUINAL HERNIA REPAIR Left 04/07/2017   Procedure: HERNIA REPAIR INGUINAL ADULT;  Surgeon: Clayburn Pert, MD;  Location: [facility] ORS;  Service: General;  Laterality: Left;  . INSERTION OF MESH Left 04/07/2017   Procedure: INSERTION OF MESH;  Surgeon: Clayburn Pert, MD;  Location: [facility] ORS;  Service: General;  Laterality: Left;  . KYPHOPLASTY N/A 11/16/2018   Procedure: KYPHOPLASTY T12;  Surgeon: Hessie Knows, MD;  Location: [facility] ORS;  Service: Orthopedics;  Laterality: N/A;  . LEFT HEART CATH AND CORONARY ANGIOGRAPHY N/A 01/10/2018   Procedure: LEFT HEART CATH AND CORONARY ANGIOGRAPHY;  Surgeon: Dionisio David, MD;  Location: [place] CV LAB;  Service: Cardiovascular;  Laterality: N/A;       Home Medications    Prior to Admission medications   Medication Sig Start Date End Date Taking? Authorizing Provider  albuterol (PROVENTIL HFA;VENTOLIN HFA) 108 (90 Base) MCG/ACT inhaler Inhale 2 puffs  into the lungs every 6 (six) hours as needed for wheezing or shortness of breath. 12/13/16  Yes Merlyn Lot, MD  amiodarone (PACERONE) 200 MG tablet Take 200 mg by mouth at bedtime.    Yes [provider]  apixaban (ELIQUIS) 5 MG TABS tablet Take 1 tablet (5 mg total) by mouth 2 (two) times daily. 02/21/17  Yes Demetrios Loll, MD  carvedilol (COREG) 6.25 MG tablet Take 6.25 mg by mouth 2 (two) times daily.   Yes [provider]  Cyanocobalamin (B-12) 2500 MCG TABS Take 2,500 mg by mouth daily.    Yes [provider]  donepezil (ARICEPT) 10 MG tablet Take 10 mg by mouth at bedtime.   Yes [provider]  enalapril (VASOTEC) 2.5 MG tablet Take 2.5 mg by mouth at bedtime.  04/12/18  Yes [provider]  finasteride (PROSCAR) 5 MG tablet Take 1 tablet (5 mg total) by mouth daily. Patient taking differently: Take 5 mg by mouth at bedtime.  06/28/18  Yes McGowan, Larene Beach A, PA-C  furosemide (LASIX) 20 MG tablet Take 20 mg by mouth daily as needed for edema.  04/26/18  Yes [provider]  isosorbide mononitrate (IMDUR) 30 MG 24 hr tablet Take 30 mg by mouth at bedtime.  11/22/16  Yes [provider]  loratadine (CLARITIN) 10 MG tablet Take 10 mg by mouth daily as needed for allergies.   Yes [provider]  memantine (NAMENDA) 10 MG tablet Take 10 mg by mouth 2 (two) times daily.    Yes [provider]  mirtazapine (REMERON) 15 MG tablet Take 15 mg by mouth at bedtime.  04/17/18  Yes [provider]  Multiple Vitamin (MULTIVITAMIN WITH MINERALS) TABS tablet Take 1 tablet by mouth daily.   Yes [provider]  rosuvastatin (CRESTOR) 40 MG tablet Take 40 mg by mouth at bedtime.   Yes [provider]  Spacer/Aero-Holding Chambers (AEROCHAMBER MV) inhaler Use as instructed 12/13/16  Yes Merlyn Lot, MD  tamsulosin (FLOMAX) 0.4 MG CAPS capsule Take 1 capsule (0.4 mg total) by mouth daily. Patient taking differently: Take 0.4 mg by mouth at bedtime.  06/28/18  Yes McGowan, Larene Beach A, PA-C  HYDROcodone-acetaminophen (NORCO) 5-325 MG tablet Take 1 tablet by mouth every 6 (six) hours as needed for moderate pain. 11/16/18   Hessie Knows, MD    Family History Family History  Problem Relation Age of Onset  . Prostate cancer Brother   . Kidney disease Neg Hx   . Bladder Cancer Neg Hx     Social History Social History   Tobacco Use  . Smoking status: Former Smoker    Packs/day: 0.50    Types: Cigarettes  .  Smokeless tobacco: Never Used  . Tobacco comment: quit 40 years  Substance Use Topics  . Alcohol use: No    Alcohol/week: 0.0 standard drinks  . Drug use: No     Allergies   Clindamycin hcl and Sulfamethoxazole-trimethoprim   Review of Systems Review of Systems  Respiratory: Positive for shortness of breath.      Physical Exam Triage Vital Signs ED Triage Vitals  Enc Vitals Group     BP 04/09/19 1014 (!) 100/59     Pulse Rate 04/09/19 1014 69     Resp 04/09/19 1014 (!) 22     Temp 04/09/19 1014 98.3 F (36.8 C)     Temp Source 04/09/19 1014 Oral     SpO2 04/09/19 1014 93 %  Weight 04/09/19 1011 191 lb (86.6 kg)     Height --      Head Circumference --      Peak Flow --      Pain Score 04/09/19 1011 0     Pain Loc --      Pain Edu? --      Excl. in [place]? --    No data found.  Updated Vital Signs BP (!) 100/59 (BP Location: Left Arm)   Pulse 69   Temp 98.3 F (36.8 C) (Oral)   Resp (!) 22   Wt 86.6 kg   SpO2 93%   BMI 25.90 kg/m   Visual Acuity Right Eye Distance:   Left Eye Distance:   Bilateral Distance:    Right Eye Near:   Left Eye Near:    Bilateral Near:     Physical Exam Vitals signs and nursing note reviewed.  Constitutional:      General: He is not in acute distress.    Appearance: He is not toxic-appearing or diaphoretic.  Cardiovascular:     Rate and Rhythm: Regular rhythm.     Pulses: Normal pulses.  Pulmonary:     Breath sounds: Rales (bilateral (left greater than right)) present.  Neurological:     Mental Status: He is alert.      UC Treatments / Results  Labs (all labs ordered are listed, but only abnormal results are displayed) Labs Reviewed - No data to display  EKG   Radiology No results found.  Procedures Procedures (including critical care time)  Medications Ordered in UC Medications - No data to display  Initial Impression / Assessment and Plan / UC Course  I have reviewed the triage vital signs and the  nursing notes.  Pertinent labs & imaging results that were available during my care of the patient were reviewed by me and considered in my medical decision making (see chart for details).      Final Clinical Impressions(s) / UC Diagnoses   Final diagnoses:  COPD exacerbation ([place])  Hypoxia  Intermittent confusion     Discharge Instructions     Recommend patient go to Emergency Department for further evaluation and management    ED Prescriptions    None     1. diagnoses reviewed with patient and daughter; recommend patient go to Emergency Department for further evaluation and management; patient in stable condition will proceed to ED by private vehicle with daughter driving.    Controlled Substance Prescriptions [place] Controlled Substance Registry consulted? Not Applicable   Norval Gable, MD 04/09/19 1131

## 2019-04-09 NOTE — Discharge Instructions (Signed)
Recommend patient go to Emergency Department for further evaluation and management °

## 2019-04-09 NOTE — ED Triage Notes (Signed)
Patient complains of shortness of breath, cough, confusion x 2-3 weeks. Patient reports that he has COPD. Patient daughter reports that his breathing has been more labored recently. Patient has been using inhalers.

## 2019-05-08 ENCOUNTER — Ambulatory Visit: Payer: Medicare HMO | Admitting: Urology

## 2019-06-05 ENCOUNTER — Ambulatory Visit (INDEPENDENT_AMBULATORY_CARE_PROVIDER_SITE_OTHER): Payer: Medicare HMO | Admitting: Urology

## 2019-06-05 ENCOUNTER — Other Ambulatory Visit: Payer: Self-pay

## 2019-06-05 ENCOUNTER — Encounter: Payer: Self-pay | Admitting: Urology

## 2019-06-05 ENCOUNTER — Ambulatory Visit: Payer: Medicare HMO | Admitting: Urology

## 2019-06-05 VITALS — BP 144/73 | HR 74 | Ht 72.0 in | Wt 197.0 lb

## 2019-06-05 DIAGNOSIS — N138 Other obstructive and reflux uropathy: Secondary | ICD-10-CM | POA: Diagnosis not present

## 2019-06-05 DIAGNOSIS — N401 Enlarged prostate with lower urinary tract symptoms: Secondary | ICD-10-CM

## 2019-06-05 DIAGNOSIS — R310 Gross hematuria: Secondary | ICD-10-CM | POA: Diagnosis not present

## 2019-06-05 LAB — BLADDER SCAN AMB NON-IMAGING

## 2019-06-05 MED ORDER — CIPROFLOXACIN HCL 500 MG PO TABS
500.0000 mg | ORAL_TABLET | Freq: Once | ORAL | Status: AC
  Administered 2019-06-05: 500 mg via ORAL

## 2019-06-05 NOTE — Progress Notes (Signed)
   06/05/19  CC:  Chief Complaint  Patient presents with  . Benign Prostatic Hypertrophy    Follow up    HPI: 85 year old male on chronic Eliquis with an episode of painless gross hematuria in 09/2026.  Never followed up for cystoscopy, offered this today for further evaluation.  No recurrences.  Blood pressure (!) 144/73, pulse 74, height 6' (1.829 m), weight 197 lb (89.4 kg). NED. A&Ox3.   No respiratory distress   Abd soft, NT, ND Normal phallus with bilateral descended testicles  Cystoscopy Procedure Note  Patient identification was confirmed, informed consent was obtained, and patient was prepped using Betadine solution.  Lidocaine jelly was administered per urethral meatus.     Pre-Procedure: - Inspection reveals a normal caliber ureteral meatus.  Procedure: The flexible cystoscope was introduced without difficulty - No urethral strictures/lesions are present. - Enlarged prostate profound trilobar coaptation, 7+ centimeter prostatic fossa, friable and bleeding with manipulation - Normal bladder neck - Bilateral ureteral orifice ease distorted by elevated bladder neck -Moderate trabeculation with saccules -Urine somewhat cloudy with suboptimal visualization, however I was able to irrigate and feel confident that all urothelium was evaluated without any obvious papillary tumors or lesions  Retroflexion shows significant intravesical protrusion of a massive prostate   Post-Procedure: - Patient tolerated the procedure well    Return in about 1 year (around 06/04/2020) for MD follow up.  Hollice Espy, MD

## 2019-06-05 NOTE — Progress Notes (Signed)
06/05/2019 2:17 PM   Chris Simon [name] 02-04-1934 [id number]  Referring provider: [name], MD [place],  [place] [id number]  Chief Complaint  Patient presents with  . Benign Prostatic Hypertrophy    Follow up    HPI: 85 year old white male with history of elevated PSA, urinary tract infection with sepsis and BPH with LUTS/ incomplete bladder emptying/ history of urinary retention and hematuria.  He was last seen in January for concern for episodes of gross hematuria which was painless and self-limited.    We discussed returning for cystoscopy, however he failed to do so.  He is unsure why he never returned, neither is his daughter.  Shortly after our visit, he did undergo kyphoplasty for benign etiology.  His not having further episodes of gross hematuria.  He has no voiding complaints today.   Elevated PSA He does have a history of elevated PSA reaching as high as 23.8 in 02/2014 at which time he underwent a prostate biopsy which was negative. He had previous other biopsies as well which were also negative. His most recent PSA was down to 8.1 in 08/2015. Rectal exams enlarged, no masses.  Given his age and comorbidities, PSA screening has been discontinued.  BPH WITH LUTS  Currently on Flomax/finasteride chronically. History of urinary retention requiring Foley catheterization approximately several years ago. Also has a history of incomplete bladder emptying with elevated postvoid residuals in the 100-200 cc range. Previous TRUS vol 234 cc in 2015 on finasteride.  Cystoscopy 08/2015 shows severe trilobar coaptation, severe heavily trabeculated bladder with well circumscribed massive median lobe retreating into the base of the bladder.  Most recent CT scan on 06/2017 shows evidence of prostamegaly with sequela of bladder outlet obstruction.  Continues to have no urinary complaints.  He feels like he is emptying his bladder well.  He has no urgency  frequency or straining.  IPSS as below.    PVR today is reassuring.  Recurrent UTIs Hospital admission in 06/2015 for gram-negative rod sepsis/ UTI/ E. Coli.   Most recent UA/urine culture on 10/25/2017 frankly positive growing E. Coli.  He was asymptomatic at the time.  His daughter believes he was treated for this although I do not see any documentation.  He is chronically colonized.  History of kidney stones Personal history of kidney stones. KUB 04/2017 shows stable 3 mm left upper pole stone.   No new stones in the kidney or bladder. Asymptomatic.   IPSS    Row Name 06/05/19 1000         International Prostate Symptom Score   How often have you had the sensation of not emptying your bladder?  Not at All     How often have you had to urinate less than every two hours?  Not at All     How often have you found you stopped and started again several times when you urinated?  Not at All     How often have you found it difficult to postpone urination?  Not at All     How often have you had a weak urinary stream?  Not at All     How often have you had to strain to start urination?  Not at All     How many times did you typically get up at night to urinate?  None     Total IPSS Score  0       Quality of Life due to urinary symptoms  If you were to spend the rest of your life with your urinary condition just the way it is now how would you feel about that?  Delighted        Score:  1-7 Mild 8-19 Moderate 20-35 Severe   PMH: Past Medical History:  Diagnosis Date  . Aneurysm ([place])    Abdominal Aortic Aneurysm  . Atrial fibrillation ([place])   . Blockage of a bile duct   . BPH with obstruction/lower urinary tract symptoms   . Cellulitis of scrotum   . COPD (chronic obstructive pulmonary disease) ([place])   . Dementia ([place])   . Diverticulosis   . Dyspnea    with exertion  . Dysrhythmia   . Elevated PSA   . Epididymitis   . Gallstone   . Gross hematuria   . History  of kidney stones   . Hyperlipidemia   . Kidney stones   . Nocturia   . Testicular pain   . TIA (transient ischemic attack)    June 2018  . Urinary retention     Surgical History: Past Surgical History:  Procedure Laterality Date  . CHOLECYSTECTOMY N/A 07/11/2015   Procedure: LAPAROSCOPIC CHOLECYSTECTOMY;  Surgeon: Rolm Bookbinder, MD;  Location: [facility];  Service: General;  Laterality: N/A;  . ERCP N/A 07/09/2015   Procedure: ENDOSCOPIC RETROGRADE CHOLANGIOPANCREATOGRAPHY (ERCP);  Surgeon: Clarene Essex, MD;  Location: [facility] ENDOSCOPY;  Service: Endoscopy;  Laterality: N/A;  . INGUINAL HERNIA REPAIR Left 04/07/2017   Procedure: HERNIA REPAIR INGUINAL ADULT;  Surgeon: Clayburn Pert, MD;  Location: [facility];  Service: General;  Laterality: Left;  . INSERTION OF MESH Left 04/07/2017   Procedure: INSERTION OF MESH;  Surgeon: Clayburn Pert, MD;  Location: [facility];  Service: General;  Laterality: Left;  . KYPHOPLASTY N/A 11/16/2018   Procedure: KYPHOPLASTY T12;  Surgeon: Hessie Knows, MD;  Location: [facility] OR[facility];  Service: Orthopedics;  Laterality: N/A;  . LEFT HEART CATH AND CORONARY ANGIOGRAPHY N/A 01/10/2018   Procedure: LEFT HEART CATH AND CORONARY ANGIOGRAPHY;  Surgeon: Dionisio David, MD;  Location: [facility];  Service: Cardiovascular;  Laterality: N/A;    Home Medications:  Allergies as of 06/05/2019      Reactions   Clindamycin Hcl    Unknown reaction    Sulfamethoxazole-trimethoprim Rash      Medication List       Accurate as of June 05, 2019  2:17 PM. If you have any questions, ask your nurse or doctor.        STOP taking these medications   AeroChamber MV inhaler Stopped by: Hollice Espy, MD   albuterol 108 (90 Base) MCG/ACT inhaler Commonly known as: VENTOLIN HFA Stopped by: Hollice Espy, MD   HYDROcodone-acetaminophen 5-325 MG tablet Commonly known as: Norco Stopped by: Hollice Espy, MD     TAKE these medications   amiodarone 200 MG tablet  Commonly known as: PACERONE Take 200 mg by mouth at bedtime.   apixaban 5 MG Tabs tablet Commonly known as: ELIQUIS Take 1 tablet (5 mg total) by mouth 2 (two) times daily.   B-12 2500 MCG Tabs Take 2,500 mg by mouth daily.   carvedilol 6.25 MG tablet Commonly known as: COREG Take 6.25 mg by mouth 2 (two) times daily.   donepezil 10 MG tablet Commonly known as: ARICEPT Take 10 mg by mouth at bedtime.   enalapril 2.5 MG tablet Commonly known as: VASOTEC Take 2.5 mg by mouth at bedtime.   finasteride 5 MG tablet Commonly  known as: PROSCAR Take 1 tablet (5 mg total) by mouth daily. What changed: when to take this   furosemide 20 MG tablet Commonly known as: LASIX Take 20 mg by mouth daily as needed for edema.   ipratropium-albuterol 0.5-2.5 (3) MG/3ML Soln Commonly known as: DUONEB Inhale into the lungs.   isosorbide mononitrate 30 MG 24 hr tablet Commonly known as: IMDUR Take 30 mg by mouth at bedtime.   loratadine 10 MG tablet Commonly known as: CLARITIN Take 10 mg by mouth daily as needed for allergies.   memantine 10 MG tablet Commonly known as: NAMENDA Take 10 mg by mouth 2 (two) times daily.   mirtazapine 15 MG tablet Commonly known as: REMERON Take 15 mg by mouth at bedtime.   multivitamin with minerals Tabs tablet Take 1 tablet by mouth daily.   rosuvastatin 40 MG tablet Commonly known as: CRESTOR Take 40 mg by mouth at bedtime.   tamsulosin 0.4 MG Caps capsule Commonly known as: FLOMAX Take 1 capsule (0.4 mg total) by mouth daily. What changed: when to take this       Allergies:  Allergies  Allergen Reactions  . Clindamycin Hcl     Unknown reaction   . Sulfamethoxazole-Trimethoprim Rash    Family History: Family History  Problem Relation Age of Onset  . Prostate cancer Brother   . Kidney disease Neg Hx   . Bladder Cancer Neg Hx     Social History:  reports that he has quit smoking. His smoking use included cigarettes. He smoked  0.50 packs per day. He has never used smokeless tobacco. He reports that he does not drink alcohol or use drugs.  ROS: UROLOGY Frequent Urination?: No Hard to postpone urination?: No Burning/pain with urination?: No Get up at night to urinate?: No Leakage of urine?: No Urine stream starts and stops?: No Trouble starting stream?: No Do you have to strain to urinate?: No Blood in urine?: No Urinary tract infection?: No Sexually transmitted disease?: No Injury to kidneys or bladder?: No Painful intercourse?: No Weak stream?: No Erection problems?: No Penile pain?: No  Gastrointestinal Nausea?: No Vomiting?: No Indigestion/heartburn?: No Diarrhea?: No Constipation?: No  Constitutional Fever: No Night sweats?: No Weight loss?: No Fatigue?: No  Skin Skin rash/lesions?: No Itching?: No  Eyes Blurred vision?: No Double vision?: No  Ears/Nose/Throat Sore throat?: No Sinus problems?: No  Hematologic/Lymphatic Swollen glands?: No Easy bruising?: No  Cardiovascular Leg swelling?: No Chest pain?: No  Respiratory Cough?: No Shortness of breath?: No  Endocrine Excessive thirst?: No  Musculoskeletal Back pain?: No Joint pain?: No  Neurological Headaches?: No Dizziness?: No  Psychologic Depression?: No Anxiety?: No  Physical Exam: BP (!) 144/73   Pulse 74   Ht 6' (1.829 m)   Wt 197 lb (89.4 kg)   BMI 26.72 kg/m   Constitutional:  Alert and oriented, No acute distress.  Accompanied by daughter today. HEENT: [place] AT, moist mucus membranes.  Trachea midline, no masses. Cardiovascular: No clubbing, cyanosis, or edema. Respiratory: Normal respiratory effort, no increased work of breathing. GU: Normal phallus with orthotopic meatus. Skin: No rashes, bruises or suspicious lesions. Neurologic: Grossly intact, no focal deficits, moving all 4 extremities. Psychiatric: Normal mood and affect.  Laboratory Data: Lab Results  Component Value Date   WBC 7.2  09/25/2018   HGB 14.1 09/25/2018   HCT 44.3 09/25/2018   MCV 95.7 09/25/2018   PLT 186 09/25/2018    Lab Results  Component Value Date   CREATININE  1.26 (H) 09/25/2018     Lab Results  Component Value Date   HGBA1C 5.9 (H) 02/19/2017    Urinalysis N/a  Pertinent Imaging: Results for orders placed or performed in visit on 06/05/19  Bladder Scan (Post Void Residual) in office  Result Value Ref Range   Scan Result 59ML      Assessment & Plan:    1. BPH with obstruction/lower urinary tract symptoms Massive prostamegaly with well-controlled obstructive urinary symptoms on Flomax and finasteride Continue these medications Not interested in surgical intervention We will continue to monitor symptoms/PVR annually - Bladder Scan (Post Void Residual) in office  2. Gross hematuria Episode of painless gross hematuria over 6 months ago which was self-limited He has multiple possible etiologies for this as previously discussed including history of kidney stones, massive prostamegaly, chronic bacterial colonization in the setting of chronic anticoagulation in the form of Eliquis I have offered a cystoscopy today which he was agreeable to, evidence of chronic outlet obstruction with massive prostamegaly and bleeding with cystoscopic manipulation likely the etiology Discussed repeating upper tract imaging, declined today Given the nature of his bleeding without recurrence, I strongly suspect that it was a lower tract issue, likely is prostate - ciprofloxacin (CIPRO) tablet 500 mg   Return in about 1 year (around 06/04/2020) for MD follow up.  Hollice Espy, MD  [facility] [address] [place], [place] [address] [phone]

## 2019-06-12 ENCOUNTER — Ambulatory Visit: Payer: Medicare HMO | Admitting: Urology

## 2019-06-20 ENCOUNTER — Ambulatory Visit: Payer: Medicare HMO | Admitting: Urology

## 2019-06-20 ENCOUNTER — Encounter: Payer: Self-pay | Admitting: Urology

## 2019-06-20 ENCOUNTER — Other Ambulatory Visit: Payer: Self-pay

## 2019-06-20 VITALS — BP 121/66 | HR 78 | Ht 72.0 in | Wt 197.0 lb

## 2019-06-20 DIAGNOSIS — N3001 Acute cystitis with hematuria: Secondary | ICD-10-CM | POA: Diagnosis not present

## 2019-06-20 DIAGNOSIS — R339 Retention of urine, unspecified: Secondary | ICD-10-CM | POA: Diagnosis not present

## 2019-06-20 DIAGNOSIS — R310 Gross hematuria: Secondary | ICD-10-CM | POA: Diagnosis not present

## 2019-06-20 DIAGNOSIS — N138 Other obstructive and reflux uropathy: Secondary | ICD-10-CM

## 2019-06-20 DIAGNOSIS — N401 Enlarged prostate with lower urinary tract symptoms: Secondary | ICD-10-CM | POA: Diagnosis not present

## 2019-06-20 NOTE — Progress Notes (Signed)
06/20/2019 9:18 AM   Chris Simon 1934-07-19 [id number]  Referring provider: [name], MD [place],  [place] [address]  Chief Complaint  Patient presents with  . Urinary Retention    Sepsis    HPI:  Chris Simon follows up today.  He is a new patient for me.  He underwent cystoscopy on 06/05/2019 for gross hematuria which revealed a 7+ centimeter prostatic fossa which was friable and bleeding.  Moderate trabeculation of the bladder with saccules but no obvious papillary tumors or lesions.  He was admitted at [facility] with confusion.  Urine culture grew E. coli and he was treated with antibiotics.  He was discharged with cefuroxime 500 mg PO q12h x 11 more days to complete a 14 day total course for complicated UTI. End date 9/25. He was also discharged with a Foley catheter for possible urinary retention.  His initial bladder scan was 450 mL.  Prior GU history reviewed and updated where appropriate:  Elevated PSA He does have a history of elevated PSA reaching as high as 23.8 in 02/2014 at which time he underwent a prostate biopsy which was negative. He had previous other biopsies as well which were also negative. His most recent PSA was down to 8.1 in 08/2015. Rectal exams enlarged, no masses.  Given his age and comorbidities, PSA screening has been discontinued.  BPH WITH LUTS  Currently on Flomax/finasteride chronically. History of urinary retention requiring Foley catheterization approximatelyseveral years ago. Also has a history of incomplete bladder emptying with elevated postvoid residuals in the 100-200 cc range. Previous TRUS vol 234 cc in 2015 on finasteride. Cystoscopy 08/2015 shows severe trilobar coaptation, severe heavily trabeculated bladder with well circumscribed massive median lobe retreating into the base of the bladder.CT scan on 06/2017 and 09/2018 shows evidence of prostamegaly with sequela of bladder outlet  obstruction ([facility]). No renal mass or hydro.   PVR was 59 mLs on 06/05/2019.  Cystoscopy revealed a 7 cm prostatic urethra friable and bleeding, no obvious urothelial lesions or tumors, moderate trabeculation and saccules.  Recurrent UTIs/Gross hematuria Hospital admission in 06/2015 for gram-negative rod sepsis/ UTI/ E. Coli.   UA/urine culture on 10/25/2017 frankly positive growing E. Coli.He was asymptomatic at the time. His daughter believes he was treated for this although I do not see any documentation.   He is chronically colonized. Admission to [facility] for UTI 05/2019 - e. Coli. Cysto/CT benign.   History of kidney stones Personal history of kidney stones. KUB8/2018shows stable 3 mm left upper pole stone.   No new stones in the kidney or bladder. Asymptomatic.  PMH: Past Medical History:  Diagnosis Date  . Aneurysm ([place])    Abdominal Aortic Aneurysm  . Atrial fibrillation ([place])   . Blockage of a bile duct   . BPH with obstruction/lower urinary tract symptoms   . Cellulitis of scrotum   . COPD (chronic obstructive pulmonary disease) ([place])   . Dementia ([place])   . Diverticulosis   . Dyspnea    with exertion  . Dysrhythmia   . Elevated PSA   . Epididymitis   . Gallstone   . Gross hematuria   . History of kidney stones   . Hyperlipidemia   . Kidney stones   . Nocturia   . Testicular pain   . TIA (transient ischemic attack)    June 2018  . Urinary retention     Surgical History: Past Surgical History:  Procedure Laterality Date  .  CHOLECYSTECTOMY N/A 07/11/2015   Procedure: LAPAROSCOPIC CHOLECYSTECTOMY;  Surgeon: Rolm Bookbinder, MD;  Location: [facility];  Service: General;  Laterality: N/A;  . ERCP N/A 07/09/2015   Procedure: ENDOSCOPIC RETROGRADE CHOLANGIOPANCREATOGRAPHY (ERCP);  Surgeon: Clarene Essex, MD;  Location: [facility] ENDOSCOPY;  Service: Endoscopy;  Laterality: N/A;  . INGUINAL HERNIA REPAIR Left 04/07/2017   Procedure: HERNIA REPAIR INGUINAL ADULT;  Surgeon:  Clayburn Pert, MD;  Location: [facility] ORS;  Service: General;  Laterality: Left;  . INSERTION OF MESH Left 04/07/2017   Procedure: INSERTION OF MESH;  Surgeon: Clayburn Pert, MD;  Location: [facility] ORS;  Service: General;  Laterality: Left;  . KYPHOPLASTY N/A 11/16/2018   Procedure: KYPHOPLASTY T12;  Surgeon: Hessie Knows, MD;  Location: [facility] ORS;  Service: Orthopedics;  Laterality: N/A;  . LEFT HEART CATH AND CORONARY ANGIOGRAPHY N/A 01/10/2018   Procedure: LEFT HEART CATH AND CORONARY ANGIOGRAPHY;  Surgeon: Dionisio David, MD;  Location: [facility];  Service: Cardiovascular;  Laterality: N/A;    Home Medications:  Allergies as of 06/20/2019      Reactions   Clindamycin Hcl    Unknown reaction    Sulfamethoxazole-trimethoprim Rash      Medication List       Accurate as of June 20, 2019  9:18 AM. If you have any questions, ask your nurse or doctor.        amiodarone 200 MG tablet Commonly known as: PACERONE Take 200 mg by mouth at bedtime.   apixaban 5 MG Tabs tablet Commonly known as: ELIQUIS Take 1 tablet (5 mg total) by mouth 2 (two) times daily.   B-12 2500 MCG Tabs Take 2,500 mg by mouth daily.   carvedilol 6.25 MG tablet Commonly known as: COREG Take 6.25 mg by mouth 2 (two) times daily.   cefUROXime 500 MG tablet Commonly known as: CEFTIN   donepezil 10 MG tablet Commonly known as: ARICEPT Take 10 mg by mouth at bedtime.   enalapril 2.5 MG tablet Commonly known as: VASOTEC Take 2.5 mg by mouth at bedtime.   finasteride 5 MG tablet Commonly known as: PROSCAR Take 1 tablet (5 mg total) by mouth daily. What changed: when to take this   furosemide 20 MG tablet Commonly known as: LASIX Take 20 mg by mouth daily as needed for edema.   ipratropium-albuterol 0.5-2.5 (3) MG/3ML Soln Commonly known as: DUONEB Inhale into the lungs.   isosorbide mononitrate 30 MG 24 hr tablet Commonly known as: IMDUR Take 30 mg by mouth at bedtime.    loratadine 10 MG tablet Commonly known as: CLARITIN Take 10 mg by mouth daily as needed for allergies.   memantine 10 MG tablet Commonly known as: NAMENDA Take 10 mg by mouth 2 (two) times daily.   mirtazapine 15 MG tablet Commonly known as: REMERON Take 15 mg by mouth at bedtime.   multivitamin with minerals Tabs tablet Take 1 tablet by mouth daily.   rosuvastatin 40 MG tablet Commonly known as: CRESTOR Take 40 mg by mouth at bedtime.   tamsulosin 0.4 MG Caps capsule Commonly known as: FLOMAX Take 1 capsule (0.4 mg total) by mouth daily. What changed: when to take this       Allergies:  Allergies  Allergen Reactions  . Clindamycin Hcl     Unknown reaction   . Sulfamethoxazole-Trimethoprim Rash    Family History: Family History  Problem Relation Age of Onset  . Prostate cancer Brother   . Kidney disease Neg Hx   .  Bladder Cancer Neg Hx     Social History:  reports that he has quit smoking. His smoking use included cigarettes. He smoked 0.50 packs per day. He has never used smokeless tobacco. He reports that he does not drink alcohol or use drugs.  ROS: UROLOGY Frequent Urination?: No Hard to postpone urination?: No Get up at night to urinate?: No Leakage of urine?: No Urine stream starts and stops?: No Trouble starting stream?: No Do you have to strain to urinate?: No Blood in urine?: No Urinary tract infection?: Yes Sexually transmitted disease?: No Injury to kidneys or bladder?: No Painful intercourse?: No Weak stream?: No Erection problems?: No Penile pain?: No  Gastrointestinal Nausea?: No Vomiting?: No Indigestion/heartburn?: No Diarrhea?: No Constipation?: No  Constitutional Fever: No Night sweats?: No Weight loss?: No Fatigue?: No  Skin Skin rash/lesions?: No Itching?: No  Eyes Blurred vision?: No Double vision?: No  Ears/Nose/Throat Sore throat?: No Sinus problems?: No  Hematologic/Lymphatic Swollen glands?: No Easy  bruising?: No  Cardiovascular Leg swelling?: No Chest pain?: No  Respiratory Cough?: No Shortness of breath?: No  Endocrine Excessive thirst?: No  Musculoskeletal Back pain?: No Joint pain?: No  Neurological Headaches?: No Dizziness?: No  Psychologic Depression?: No Anxiety?: No  Physical Exam: BP 121/66   Pulse 78   Ht 6' (1.829 m)   Wt 89.4 kg   BMI 26.72 kg/m   Constitutional:  Alert and oriented, No acute distress. HEENT: [place] AT, moist mucus membranes.  Trachea midline, no masses. Cardiovascular: No clubbing, cyanosis, or edema. Respiratory: Normal respiratory effort, no increased work of breathing. GI: Abdomen is soft, nontender, nondistended, no abdominal masses GU: No CVA tenderness -  Urine is amber to light red in bag. He initially had some blood per meatus following the Foley removal.  Sarah and I checked on him after a few minutes and this had stopped.  Urine was light red in the urinal.  Again no clots. Skin: No rashes, bruises or suspicious lesions. Neurologic: Grossly intact, no focal deficits, moving all 4 extremities. Psychiatric: Normal mood and affect.  Laboratory Data: Lab Results  Component Value Date   WBC 7.2 09/25/2018   HGB 14.1 09/25/2018   HCT 44.3 09/25/2018   MCV 95.7 09/25/2018   PLT 186 09/25/2018    Lab Results  Component Value Date   CREATININE 1.26 (H) 09/25/2018    No results found for: PSA  No results found for: TESTOSTERONE  Lab Results  Component Value Date   HGBA1C 5.9 (H) 02/19/2017    Urinalysis    Component Value Date/Time   COLORURINE YELLOW (A) 09/25/2018 1042   APPEARANCEUR CLOUDY (A) 09/25/2018 1042   APPEARANCEUR Cloudy (A) 05/02/2018 1439   LABSPEC 1.016 09/25/2018 1042   LABSPEC 1.019 06/21/2014 1800   PHURINE 5.0 09/25/2018 1042   GLUCOSEU NEGATIVE 09/25/2018 1042   GLUCOSEU Negative 06/21/2014 1800   HGBUR SMALL (A) 09/25/2018 1042   BILIRUBINUR NEGATIVE 09/25/2018 1042   BILIRUBINUR  Negative 05/02/2018 1439   BILIRUBINUR Negative 06/21/2014 1800   KETONESUR NEGATIVE 09/25/2018 1042   PROTEINUR NEGATIVE 09/25/2018 1042   NITRITE NEGATIVE 09/25/2018 1042   LEUKOCYTESUR LARGE (A) 09/25/2018 1042   LEUKOCYTESUR 2+ (A) 05/02/2018 1439   LEUKOCYTESUR Trace 06/21/2014 1800    Lab Results  Component Value Date   LABMICR See below: 05/02/2018   WBCUA 11-30 (A) 05/02/2018   RBCUA 0-2 05/02/2018   LABEPIT 0-10 05/02/2018   MUCUS Present (A) 05/02/2018   BACTERIA NONE SEEN 09/25/2018  Pertinent Imaging: KUB, CT images  Also reviewed [facility] notes  Results for orders placed during the hospital encounter of 04/29/17  DG Abd 1 View   Narrative CLINICAL DATA:  Hematuria.  History of kidney stones.  EXAM: ABDOMEN - 1 VIEW  COMPARISON:  03/25/2016  FINDINGS: A 3 mm calculus projecting over the lower pole of the left kidney is unchanged. No definite right renal calculi or ureteral calculi are identified. A small calcification in the right pelvis is unchanged and compatible with a phlebolith. Prior cholecystectomy is noted. No acute osseous abnormality is seen. There is a nonobstructed bowel gas pattern.  IMPRESSION: Unchanged 3 mm left renal calculus.   Electronically Signed   By: Logan Bores M.D.   On: 04/29/2017 10:16    No results found for this or any previous visit. No results found for this or any previous visit. No results found for this or any previous visit. No results found for this or any previous visit. No results found for this or any previous visit. No results found for this or any previous visit. Results for orders placed during the hospital encounter of 07/08/15  CT RENAL STONE STUDY   Narrative CLINICAL DATA:  Left flank pain and hematuria  EXAM: CT ABDOMEN AND PELVIS WITHOUT CONTRAST  TECHNIQUE: Multidetector CT imaging of the abdomen and pelvis was performed following the standard protocol without IV contrast.  COMPARISON:  None.   FINDINGS: Lung bases are free of acute infiltrate or sizable effusion.  The liver is homogeneous in attenuation without focal mass. Central periportal edema is identified as well as some dilatation of the intra and extrahepatic biliary tree. Multiple gallstones are noted within a distended gallbladder. Additionally, there are multiple calculi in the distal aspect of the common bile duct causing the dilatation.  The spleen, adrenal glands and pancreas are otherwise within normal limits. The kidneys are well visualized bilaterally with nonobstructing renal calculus on the left measuring 4-5 mm. Additionally a cystic lesion is noted from the upper pole of the right kidney.  Diffuse aortoiliac calcifications are noted. Mild dilatation to 3.4 cm is noted. The appendix is within normal limits. There are diverticular changes in the colon although no findings to suggest diverticulitis are seen. A small left inguinal hernia is noted containing a portion of the sigmoid colon although no obstructive changes are seen.  The prostate is significantly enlarged measuring 8.8 x 7.6 cm. On the left lateral aspect of the prostate it appears distorted and the possibility of an underlying mass cannot be totally excluded. No definitive pelvic adenopathy is seen. The bladder demonstrates some increased trabeculation likely related to bladder outlet syndrome.  IMPRESSION: Enlarged prostate with changes suggestive of bladder outlet obstruction. The prostate is irregular particularly on its left lateral aspect which may represent an underlying mass lesion. Clinical correlation with the physical exam is recommended.  Small left inguinal hernia containing a loop of nonobstructed sigmoid colon.  Cholelithiasis as well as evidence of intrahepatic and extrahepatic biliary ductal dilatation. There are at multiple calculi within the distal common bile duct.  Nonobstructing left renal stone.   Diverticulosis without diverticulitis.   Electronically Signed   By: Inez Catalina M.D.   On: 07/08/2015 17:43     Assessment & Plan:    Urinary retention- unclear if this is a true urinary retention event.  Voiding trial today successful.  BPH-continue tamsulosin and finasteride.  I went over again the nature and prognosis of the patient's BPH with the  patient and his daughter.  He is likely going to struggle with issues of hematuria, infection and retention with the prostate as large as his.  Consideration could be made to prostate artery embolization but his kidney function is not optimal.  Also a robotic simple prostatectomy could be entertained.  They will consider.  Gross hematuria- again would be expected with a catheter, large prostate and blood thinner use.  Hematuria was mild today.  UTI-he will complete antibiotics.  Encouraged him to remain well-hydrated.  No follow-ups on file.  Festus Aloe, MD  [facility] [address] [place], [place] [address] [phone]

## 2019-06-20 NOTE — Progress Notes (Signed)
Fill and Pull Catheter Removal  Patient is present today for a catheter removal.  Patient was cleaned and prepped in a sterile fashion 1102ml of sterile water/ saline was instilled into the bladder when the patient felt the urge to urinate. 41ml of water was then drained from the balloon.  A 16FR foley cath was removed from the bladder complications were noted as: patient experienced a bladder spasm and had to stop filling and remove cath, some blood noted .  Patient as then given some time to void on their own.  Patient can void  172ml on their own after some time.  Patient tolerated well.  Performed by: Fonnie Jarvis, CMA  Follow up/ Additional notes: 2 wk w/PVR

## 2019-06-20 NOTE — Patient Instructions (Signed)

## 2019-07-02 IMAGING — XA DG C-ARM 61-120 MIN
2 series · 2 of 2 positions shown · non-contrast
Comparison: MRI thoracic spine 11/14/2018.

CLINICAL DATA: Painful compression fracture.

EXAM:
THORACIC SPINE 2 VIEWS; DG C-ARM 61-120 MIN

[Series 3: cont. · 1 of 1 slices shown (1 of 2)]
[im 1/1]
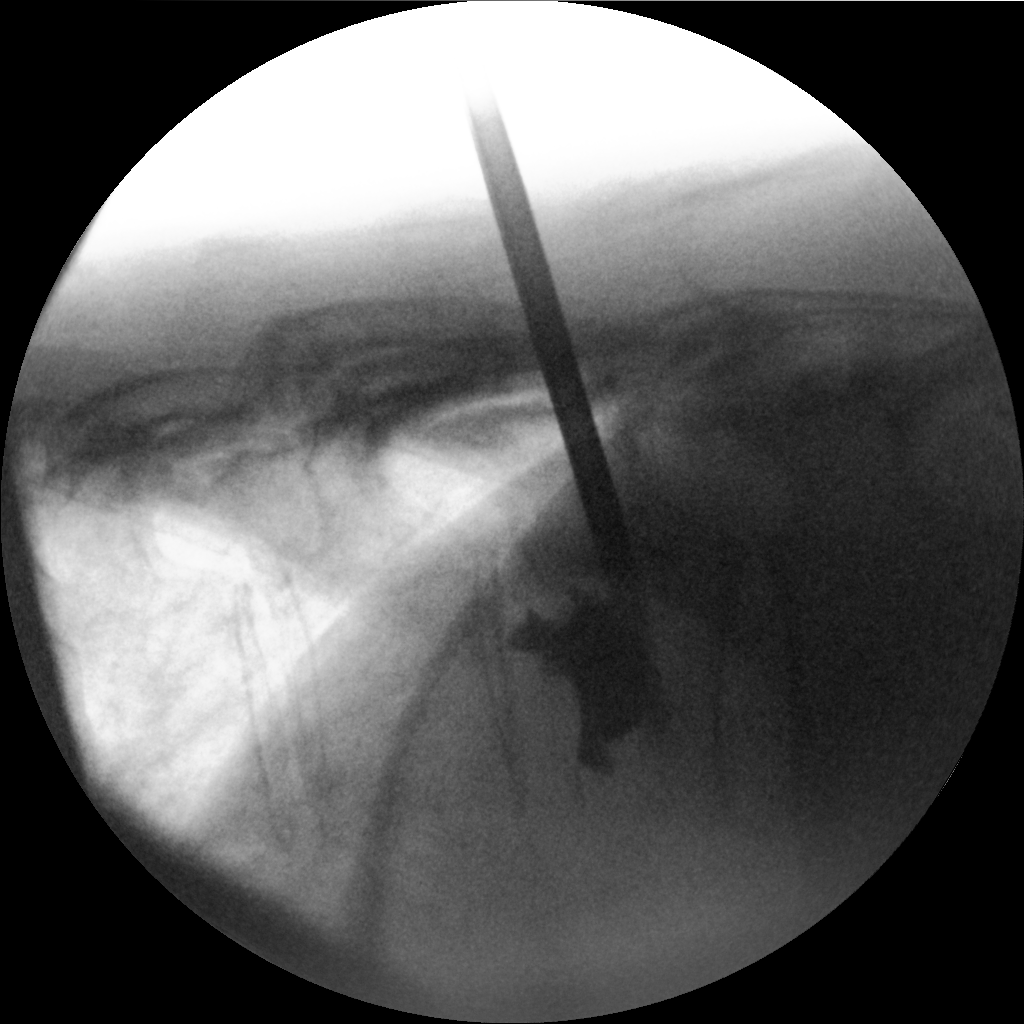

[Series 4: cont. · 1 of 1 slices shown (2 of 2)]
[im 1/1]
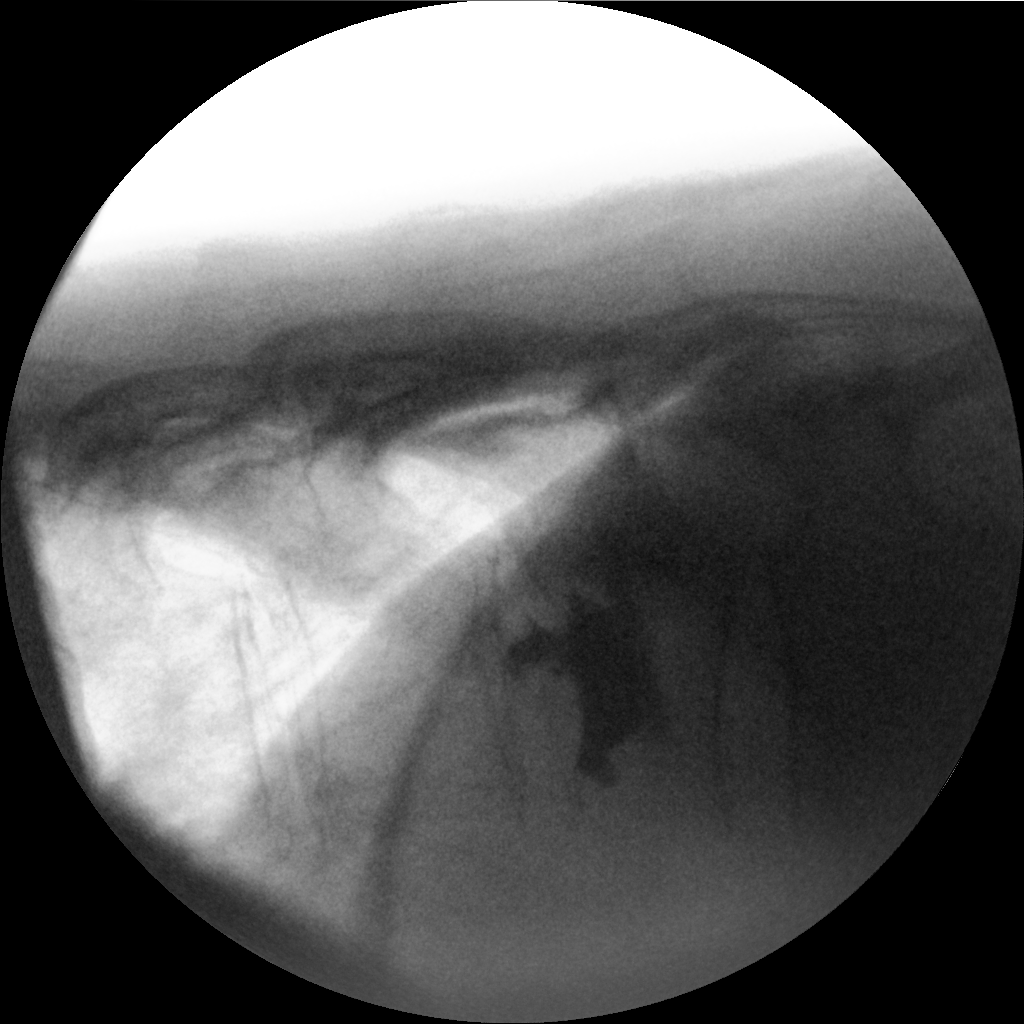

[2 of 2 positions shown; findings below may reference images not displayed]

FINDINGS: Unilateral approach to vertebral augmentation was performed, T12 on
the RIGHT. Predominant filling was that of the LEFT side of the
vertebral body although some methylmethacrylate does cross the
midline to the RIGHT. Extension of methylmethacrylate into the
T11-T12 disc space is noted.
IMPRESSION: As above.

## 2019-07-04 NOTE — Progress Notes (Signed)
07/05/2019 11:53 AM   Domenic Moras Nicki Reaper Jun 18, 1934 [id number]  Referring provider: [name], MD [place],  [place] [address]  Chief Complaint  Patient presents with  . Hematuria    HPI: Mr. Benion is an 85 year old male with BPH with LU TS, a history of hematuria, rUTI's and urinary retention who presents today for follow up bladder scan and UA with his daughter, [name].    BPH WITH LUTS  Currently on Flomax/finasteride chronically. History of urinary retention requiring Foley catheterization approximatelyseveral years ago. Also has a history of incomplete bladder emptying with elevated postvoid residuals in the 100-200 cc range. Previous TRUS vol 234 cc in 2015 on finasteride. Cystoscopy 08/2015 shows severe trilobar coaptation, severe heavily trabeculated bladder with well circumscribed massive median lobe retreating into the base of the bladder.Most recent CT scan on 06/2017 shows evidence of prostamegaly with sequela of bladder outlet obstruction.  He underwent cystoscopy on 06/05/2019 for gross hematuria which revealed a 7+ centimeter prostatic fossa which was friable and bleeding.  Moderate trabeculation of the bladder with saccules but no obvious papillary tumors or lesions.    He was recently seen by Dr. Junious Silk on June 20, 2019 after being hospitalized at [facility] for confusion, UTI and sepsis.  He went into urinary retention while hospitalized and a Foley was placed.  His Foley was removed on 06/20/2019 and he was instructed to continue his finasteride 5 mg and tamsulosin 0.4 mg daily.    He has no complaints at this visit.  His bladder scan is 54 mL.  Patient denies any gross hematuria, dysuria or suprapubic/flank pain.  Patient denies any fevers, chills, nausea or vomiting.    Elevated PSA He does have a history of elevated PSA reaching as high as 23.8 in 02/2014 at which time he underwent a prostate biopsy which was negative. He had  previous other biopsies as well which were also negative. His most recent PSA was down to 8.1 in 08/2015.  Given his age and comorbidities, PSA screening has been discontinued.  Recurrent UTIs/Gross hematuria Hospital admission in 06/2015 for gram-negative rod sepsis/ UTI/ E. Coli.     UA/urine culture on 10/25/2017 frankly positive growing E. Coli.He was asymptomatic at the time. His daughter believes he was treated for this although I do not see any documentation.   He is chronically colonized. Admission to [facility] for UTI 05/2019 - e. Coli. Cysto/CT benign.   He is chronically colonized.  UA today is nitrite positive, > 30 WBC's and many bacteria.  He is asymptomatic at this time.    History of kidney stones Personal history of kidney stones. KUB8/2018shows stable 3 mm left upper pole stone.   No new stones in the kidney or bladder. Asymptomatic.  PMH: Past Medical History:  Diagnosis Date  . Aneurysm ([place])    Abdominal Aortic Aneurysm  . Atrial fibrillation ([place])   . Blockage of a bile duct   . BPH with obstruction/lower urinary tract symptoms   . Cellulitis of scrotum   . COPD (chronic obstructive pulmonary disease) ([place])   . Dementia ([place])   . Diverticulosis   . Dyspnea    with exertion  . Dysrhythmia   . Elevated PSA   . Epididymitis   . Gallstone   . Gross hematuria   . History of kidney stones   . Hyperlipidemia   . Kidney stones   . Nocturia   . Testicular pain   . TIA (  transient ischemic attack)    June 2018  . Urinary retention     Surgical History: Past Surgical History:  Procedure Laterality Date  . CHOLECYSTECTOMY N/A 07/11/2015   Procedure: LAPAROSCOPIC CHOLECYSTECTOMY;  Surgeon: Rolm Bookbinder, MD;  Location: [facility];  Service: General;  Laterality: N/A;  . ERCP N/A 07/09/2015   Procedure: ENDOSCOPIC RETROGRADE CHOLANGIOPANCREATOGRAPHY (ERCP);  Surgeon: Clarene Essex, MD;  Location[facility] ENDOSCOPY;  Service: Endoscopy;  Laterality: N/A;  .  INGUINAL HERNIA REPAIR Left 04/07/2017   Procedure: HERNIA REPAIR INGUINAL ADULT;  Surgeon: Clayburn Pert, MD;  Location: [facility] ORS;  Service: General;  Laterality: Left;  . INSERTION OF MESH Left 04/07/2017   Procedure: INSERTION OF MESH;  Surgeon: Clayburn Pert, MD;  Location: [facility] ORS;  Service: General;  Laterality: Left;  . KYPHOPLASTY N/A 11/16/2018   Procedure: KYPHOPLASTY T12;  Surgeon: Hessie Knows, MD;  Location: [facility] ORS;  Service: Orthopedics;  Laterality: N/A;  . LEFT HEART CATH AND CORONARY ANGIOGRAPHY N/A 01/10/2018   Procedure: LEFT HEART CATH AND CORONARY ANGIOGRAPHY;  Surgeon: Dionisio David, MD;  Location: [place] [facility];  Service: Cardiovascular;  Laterality: N/A;    Home Medications:  Allergies as of 07/05/2019      Reactions   Clindamycin Hcl    Unknown reaction    Sulfamethoxazole-trimethoprim Rash      Medication List       Accurate as of July 05, 2019 11:59 PM. If you have any questions, ask your nurse or doctor.        STOP taking these medications   cefUROXime 500 MG tablet Commonly known as: CEFTIN Stopped by: Zara Council, PA-C     TAKE these medications   amiodarone 200 MG tablet Commonly known as: PACERONE Take 200 mg by mouth at bedtime.   apixaban 5 MG Tabs tablet Commonly known as: ELIQUIS Take 1 tablet (5 mg total) by mouth 2 (two) times daily.   B-12 2500 MCG Tabs Take 2,500 mg by mouth daily.   carvedilol 6.25 MG tablet Commonly known as: COREG Take 6.25 mg by mouth 2 (two) times daily.   donepezil 10 MG tablet Commonly known as: ARICEPT Take 10 mg by mouth at bedtime.   enalapril 2.5 MG tablet Commonly known as: VASOTEC Take 2.5 mg by mouth at bedtime.   finasteride 5 MG tablet Commonly known as: PROSCAR Take 1 tablet (5 mg total) by mouth daily. What changed: when to take this   furosemide 20 MG tablet Commonly known as: LASIX Take 20 mg by mouth daily as needed for edema.   ipratropium-albuterol  0.5-2.5 (3) MG/3ML Soln Commonly known as: DUONEB Inhale into the lungs.   isosorbide mononitrate 30 MG 24 hr tablet Commonly known as: IMDUR Take 30 mg by mouth at bedtime.   loratadine 10 MG tablet Commonly known as: CLARITIN Take 10 mg by mouth daily as needed for allergies.   memantine 10 MG tablet Commonly known as: NAMENDA Take 10 mg by mouth 2 (two) times daily.   mirtazapine 15 MG tablet Commonly known as: REMERON Take 15 mg by mouth at bedtime.   multivitamin with minerals Tabs tablet Take 1 tablet by mouth daily.   rosuvastatin 40 MG tablet Commonly known as: CRESTOR Take 40 mg by mouth at bedtime.   tamsulosin 0.4 MG Caps capsule Commonly known as: FLOMAX Take 1 capsule (0.4 mg total) by mouth daily. What changed: when to take this       Allergies:  Allergies  Allergen  Reactions  . Clindamycin Hcl     Unknown reaction   . Sulfamethoxazole-Trimethoprim Rash    Family History: Family History  Problem Relation Age of Onset  . Prostate cancer Brother   . Kidney disease Neg Hx   . Bladder Cancer Neg Hx     Social History:  reports that he has quit smoking. His smoking use included cigarettes. He smoked 0.50 packs per day. He has never used smokeless tobacco. He reports that he does not drink alcohol or use drugs.  ROS: UROLOGY Frequent Urination?: No Hard to postpone urination?: No Burning/pain with urination?: No Get up at night to urinate?: No Leakage of urine?: No Urine stream starts and stops?: No Trouble starting stream?: No Do you have to strain to urinate?: No Blood in urine?: No Urinary tract infection?: No Sexually transmitted disease?: No Injury to kidneys or bladder?: No Painful intercourse?: No Weak stream?: No Erection problems?: No Penile pain?: No  Gastrointestinal Nausea?: No Vomiting?: No Indigestion/heartburn?: No Diarrhea?: No Constipation?: No  Constitutional Fever: No Night sweats?: No Weight loss?: No  Fatigue?: No  Skin Skin rash/lesions?: No Itching?: No  Eyes Blurred vision?: No Double vision?: No  Ears/Nose/Throat Sore throat?: No Sinus problems?: No  Hematologic/Lymphatic Swollen glands?: No Easy bruising?: No  Cardiovascular Leg swelling?: No Chest pain?: No  Respiratory Cough?: No Shortness of breath?: No  Endocrine Excessive thirst?: No  Musculoskeletal Back pain?: No Joint pain?: No  Neurological Headaches?: No Dizziness?: No  Psychologic Depression?: No Anxiety?: No  Physical Exam: BP 119/69 (BP Location: Left Arm, Patient Position: Sitting, Cuff Size: Normal)   Pulse 69   Ht 6' (1.829 m)   Wt 191 lb 6.4 oz (86.8 kg)   BMI 25.96 kg/m   Constitutional:  Well nourished. Alert and oriented, No acute distress. HEENT: [place] AT, moist mucus membranes.  Trachea midline, no masses. Cardiovascular: No clubbing, cyanosis, or edema. Respiratory: Normal respiratory effort, no increased work of breathing. Neurologic: Grossly intact, no focal deficits, moving all 4 extremities. Psychiatric: Normal mood and affect.  Laboratory Data: Lab Results  Component Value Date   WBC 7.2 09/25/2018   HGB 14.1 09/25/2018   HCT 44.3 09/25/2018   MCV 95.7 09/25/2018   PLT 186 09/25/2018    Lab Results  Component Value Date   CREATININE 1.26 (H) 09/25/2018    No results found for: PSA  No results found for: TESTOSTERONE  Lab Results  Component Value Date   HGBA1C 5.9 (H) 02/19/2017    Urinalysis Component     Latest Ref Rng & Units 07/05/2019  Specific Gravity, UA     1.005 - 1.030 1.020  pH, UA     5.0 - 7.5 5.5  Color, UA     Yellow Yellow  Appearance Ur     Clear Cloudy (A)  Leukocytes,UA     Negative 1+ (A)  Protein,UA     Negative/Trace Negative  Glucose, UA     Negative Negative  Ketones, UA     Negative Negative  RBC, UA     Negative Trace (A)  Bilirubin, UA     Negative Negative  Urobilinogen, Ur     0.2 - 1.0 mg/dL 0.2   Nitrite, UA     Negative Positive (A)  Microscopic Examination      See below:   Component     Latest Ref Rng & Units 07/05/2019          WBC, UA  0 - 5 /hpf >30 (A)  RBC     0 - 2 /hpf None seen  Epithelial Cells (non renal)     0 - 10 /hpf 0-10  Bacteria, UA     None seen/Few Many (A)     Pertinent Imaging: Results for KIERNAN, BARTLETTE (MRN [id number]) as of 07/17/2019 15:25  Ref. Range 07/05/2019 08:35  Scan Result Unknown 54     Assessment & Plan:    1. Urinary retention Resolved Continue tamsulosin 0.4 mg and finasteride 5 mg daily Asked to contact the office or seek treatment in the emergency department if finding it more difficult to urinate or unable to urinate at all  2. BPH with LU TS Continue tamsulosin and finasteride Patient and his daughter are deferring on interinvention in regards to possible prostate artery embolization for a prostatectomy at this time for his prostate issues   3. Gross hematuria No reports of gross hematuria since catheter removed UA today negative for microscopic hematuria   4. rUTI Patient is chronically colonized therefore we will only treat when symptomatic Patient and daughter will continue to monitor for any signs of infection such as gross hematuria, dysuria, suprapubic pain, fever/chills and/or nausea or vomiting.  Return for keep follow up with Dr. Erlene Quan in 05/2020 .  Zara Council, PA-C  [facility] [address], [place], [place] [address] [phone]

## 2019-07-05 ENCOUNTER — Ambulatory Visit (INDEPENDENT_AMBULATORY_CARE_PROVIDER_SITE_OTHER): Payer: Medicare HMO | Admitting: Urology

## 2019-07-05 ENCOUNTER — Other Ambulatory Visit: Payer: Self-pay

## 2019-07-05 ENCOUNTER — Encounter: Payer: Self-pay | Admitting: Urology

## 2019-07-05 VITALS — BP 119/69 | HR 69 | Ht 72.0 in | Wt 191.4 lb

## 2019-07-05 DIAGNOSIS — R310 Gross hematuria: Secondary | ICD-10-CM

## 2019-07-05 DIAGNOSIS — N138 Other obstructive and reflux uropathy: Secondary | ICD-10-CM

## 2019-07-05 DIAGNOSIS — N390 Urinary tract infection, site not specified: Secondary | ICD-10-CM | POA: Diagnosis not present

## 2019-07-05 DIAGNOSIS — N401 Enlarged prostate with lower urinary tract symptoms: Secondary | ICD-10-CM

## 2019-07-05 DIAGNOSIS — R339 Retention of urine, unspecified: Secondary | ICD-10-CM | POA: Diagnosis not present

## 2019-07-05 LAB — URINALYSIS, COMPLETE
Bilirubin, UA: NEGATIVE
Glucose, UA: NEGATIVE
Ketones, UA: NEGATIVE
Nitrite, UA: POSITIVE — AB
Protein,UA: NEGATIVE
Specific Gravity, UA: 1.02 (ref 1.005–1.030)
Urobilinogen, Ur: 0.2 mg/dL (ref 0.2–1.0)
pH, UA: 5.5 (ref 5.0–7.5)

## 2019-07-05 LAB — MICROSCOPIC EXAMINATION
RBC, Urine: NONE SEEN /hpf (ref 0–2)
WBC, UA: >30 /hpf — AB (ref 0–5)

## 2019-07-05 LAB — BLADDER SCAN AMB NON-IMAGING: Scan Result: 54

## 2019-07-05 MED ORDER — FINASTERIDE 5 MG PO TABS: 5.0000 mg | ORAL_TABLET | Freq: Every day | ORAL | 3 refills | Status: DC

## 2019-07-05 MED ORDER — TAMSULOSIN HCL 0.4 MG PO CAPS: 0.4000 mg | ORAL_CAPSULE | Freq: Every day | ORAL | 3 refills | Status: DC

## 2019-09-23 DIAGNOSIS — R5381 Other malaise: Secondary | ICD-10-CM | POA: Insufficient documentation

## 2019-10-22 ENCOUNTER — Inpatient Hospital Stay
Admission: EM | Admit: 2019-10-22 | Discharge: 2019-10-25 | DRG: 189 | Disposition: A | Discharge status: Skilled Nursing Facility | Payer: Medicare HMO | Source: Skilled Nursing Facility | Attending: Internal Medicine | Admitting: Internal Medicine

## 2019-10-22 ENCOUNTER — Encounter: Payer: Self-pay | Admitting: Emergency Medicine

## 2019-10-22 ENCOUNTER — Other Ambulatory Visit: Payer: Self-pay

## 2019-10-22 ENCOUNTER — Emergency Department: Payer: Medicare HMO

## 2019-10-22 DIAGNOSIS — Z79899 Other long term (current) drug therapy: Secondary | ICD-10-CM | POA: Diagnosis not present

## 2019-10-22 DIAGNOSIS — J449 Chronic obstructive pulmonary disease, unspecified: Secondary | ICD-10-CM | POA: Diagnosis present

## 2019-10-22 DIAGNOSIS — I482 Chronic atrial fibrillation, unspecified: Secondary | ICD-10-CM | POA: Diagnosis present

## 2019-10-22 DIAGNOSIS — F0280 Dementia in other diseases classified elsewhere without behavioral disturbance: Secondary | ICD-10-CM | POA: Diagnosis not present

## 2019-10-22 DIAGNOSIS — F0390 Unspecified dementia without behavioral disturbance: Secondary | ICD-10-CM | POA: Diagnosis present

## 2019-10-22 DIAGNOSIS — Z20822 Contact with and (suspected) exposure to covid-19: Secondary | ICD-10-CM | POA: Diagnosis present

## 2019-10-22 DIAGNOSIS — Z8616 Personal history of COVID-19: Secondary | ICD-10-CM | POA: Diagnosis not present

## 2019-10-22 DIAGNOSIS — Z8673 Personal history of transient ischemic attack (TIA), and cerebral infarction without residual deficits: Secondary | ICD-10-CM | POA: Diagnosis not present

## 2019-10-22 DIAGNOSIS — G309 Alzheimer's disease, unspecified: Secondary | ICD-10-CM | POA: Diagnosis not present

## 2019-10-22 DIAGNOSIS — Z87440 Personal history of urinary (tract) infections: Secondary | ICD-10-CM | POA: Diagnosis not present

## 2019-10-22 DIAGNOSIS — Z9049 Acquired absence of other specified parts of digestive tract: Secondary | ICD-10-CM

## 2019-10-22 DIAGNOSIS — N400 Enlarged prostate without lower urinary tract symptoms: Secondary | ICD-10-CM | POA: Diagnosis present

## 2019-10-22 DIAGNOSIS — Z87891 Personal history of nicotine dependence: Secondary | ICD-10-CM | POA: Diagnosis not present

## 2019-10-22 DIAGNOSIS — Z8042 Family history of malignant neoplasm of prostate: Secondary | ICD-10-CM

## 2019-10-22 DIAGNOSIS — R531 Weakness: Secondary | ICD-10-CM | POA: Diagnosis not present

## 2019-10-22 DIAGNOSIS — Z87442 Personal history of urinary calculi: Secondary | ICD-10-CM | POA: Diagnosis not present

## 2019-10-22 DIAGNOSIS — I714 Abdominal aortic aneurysm, without rupture: Secondary | ICD-10-CM | POA: Diagnosis present

## 2019-10-22 DIAGNOSIS — J84178 Other interstitial pulmonary diseases with fibrosis in diseases classified elsewhere: Secondary | ICD-10-CM | POA: Diagnosis present

## 2019-10-22 DIAGNOSIS — E785 Hyperlipidemia, unspecified: Secondary | ICD-10-CM | POA: Diagnosis present

## 2019-10-22 DIAGNOSIS — Z7901 Long term (current) use of anticoagulants: Secondary | ICD-10-CM | POA: Diagnosis not present

## 2019-10-22 DIAGNOSIS — J9601 Acute respiratory failure with hypoxia: Secondary | ICD-10-CM | POA: Diagnosis present

## 2019-10-22 DIAGNOSIS — Z8659 Personal history of other mental and behavioral disorders: Secondary | ICD-10-CM | POA: Diagnosis not present

## 2019-10-22 DIAGNOSIS — Z881 Allergy status to other antibiotic agents status: Secondary | ICD-10-CM | POA: Diagnosis not present

## 2019-10-22 DIAGNOSIS — Z882 Allergy status to sulfonamides status: Secondary | ICD-10-CM | POA: Diagnosis not present

## 2019-10-22 DIAGNOSIS — Z960 Presence of urogenital implants: Secondary | ICD-10-CM | POA: Diagnosis present

## 2019-10-22 DIAGNOSIS — Z978 Presence of other specified devices: Secondary | ICD-10-CM | POA: Diagnosis not present

## 2019-10-22 DIAGNOSIS — I480 Paroxysmal atrial fibrillation: Secondary | ICD-10-CM | POA: Diagnosis present

## 2019-10-22 LAB — COMPREHENSIVE METABOLIC PANEL
ALT: 19 U/L (ref 0–44)
AST: 20 U/L (ref 15–41)
Albumin: 1.7 g/dL — ABNORMAL LOW (ref 3.5–5.0)
Alkaline Phosphatase: 89 U/L (ref 38–126)
Anion gap: 6 (ref 5–15)
BUN: 23 mg/dL (ref 8–23)
CO2: 28 mmol/L (ref 22–32)
Calcium: 7.5 mg/dL — ABNORMAL LOW (ref 8.9–10.3)
Chloride: 111 mmol/L (ref 98–111)
Creatinine, Ser: 1.33 mg/dL — ABNORMAL HIGH (ref 0.61–1.24)
GFR calc Af Amer: 57 mL/min — ABNORMAL LOW (ref >60)
GFR calc non Af Amer: 49 mL/min — ABNORMAL LOW (ref >60)
Glucose, Bld: 97 mg/dL (ref 70–99)
Potassium: 3.9 mmol/L (ref 3.5–5.1)
Sodium: 145 mmol/L (ref 135–145)
Total Bilirubin: 0.8 mg/dL (ref 0.3–1.2)
Total Protein: 5.3 g/dL — ABNORMAL LOW (ref 6.5–8.1)

## 2019-10-22 LAB — CBC WITH DIFFERENTIAL/PLATELET
Abs Immature Granulocytes: 0.11 10*3/uL — ABNORMAL HIGH (ref 0.00–0.07)
Basophils Absolute: 0.1 10*3/uL (ref 0.0–0.1)
Basophils Relative: 1 %
Eosinophils Absolute: 0.5 10*3/uL (ref 0.0–0.5)
Eosinophils Relative: 5 %
HCT: 30.1 % — ABNORMAL LOW (ref 39.0–52.0)
Hemoglobin: 9.3 g/dL — ABNORMAL LOW (ref 13.0–17.0)
Immature Granulocytes: 1 %
Lymphocytes Relative: 12 %
Lymphs Abs: 1.2 10*3/uL (ref 0.7–4.0)
MCH: 30.3 pg (ref 26.0–34.0)
MCHC: 30.9 g/dL (ref 30.0–36.0)
MCV: 98 fL (ref 80.0–100.0)
Monocytes Absolute: 0.9 10*3/uL (ref 0.1–1.0)
Monocytes Relative: 9 %
Neutro Abs: 6.7 10*3/uL (ref 1.7–7.7)
Neutrophils Relative %: 72 %
Platelets: 386 10*3/uL (ref 150–400)
RBC: 3.07 MIL/uL — ABNORMAL LOW (ref 4.22–5.81)
RDW: 14.6 % (ref 11.5–15.5)
WBC: 9.4 10*3/uL (ref 4.0–10.5)
nRBC: 0 % (ref 0.0–0.2)

## 2019-10-22 LAB — POC SARS CORONAVIRUS 2 AG: SARS Coronavirus 2 Ag: NEGATIVE

## 2019-10-22 LAB — URINALYSIS, COMPLETE (UACMP) WITH MICROSCOPIC
Bilirubin Urine: NEGATIVE
Glucose, UA: NEGATIVE mg/dL
Ketones, ur: NEGATIVE mg/dL
Nitrite: NEGATIVE
Protein, ur: NEGATIVE mg/dL
RBC / HPF: >50 RBC/hpf — ABNORMAL HIGH (ref 0–5)
Specific Gravity, Urine: 1.012 (ref 1.005–1.030)
WBC, UA: >50 WBC/hpf — ABNORMAL HIGH (ref 0–5)
pH: 5 (ref 5.0–8.0)

## 2019-10-22 LAB — LACTIC ACID, PLASMA: Lactic Acid, Venous: 0.8 mmol/L (ref 0.5–1.9)

## 2019-10-22 LAB — PROCALCITONIN: Procalcitonin: 0.1 ng/mL

## 2019-10-22 MED ORDER — VANCOMYCIN HCL IN DEXTROSE 1-5 GM/200ML-% IV SOLN
1000.0000 mg | Freq: Once | INTRAVENOUS | Status: AC
  Administered 2019-10-22: 12:00:00 1000 mg via INTRAVENOUS
  Filled 2019-10-22: qty 200

## 2019-10-22 MED ORDER — FINASTERIDE 5 MG PO TABS
5.0000 mg | ORAL_TABLET | Freq: Every day | ORAL | Status: DC
  Administered 2019-10-22 – 2019-10-25 (×4): 5 mg via ORAL
  Filled 2019-10-22 (×5): qty 1

## 2019-10-22 MED ORDER — APIXABAN 5 MG PO TABS
5.0000 mg | ORAL_TABLET | Freq: Two times a day (BID) | ORAL | Status: DC
  Administered 2019-10-22 – 2019-10-25 (×7): 5 mg via ORAL
  Filled 2019-10-22 (×7): qty 1

## 2019-10-22 MED ORDER — SODIUM CHLORIDE 0.9 % IV SOLN
2.0000 g | Freq: Once | INTRAVENOUS | Status: AC
  Administered 2019-10-22: 11:00:00 2 g via INTRAVENOUS
  Filled 2019-10-22: qty 2

## 2019-10-22 MED ORDER — ONDANSETRON HCL 4 MG PO TABS: 4.0000 mg | ORAL_TABLET | Freq: Four times a day (QID) | ORAL | Status: DC | PRN

## 2019-10-22 MED ORDER — AMIODARONE HCL 200 MG PO TABS
200.0000 mg | ORAL_TABLET | Freq: Every day | ORAL | Status: DC
  Administered 2019-10-22 – 2019-10-24 (×3): 200 mg via ORAL
  Filled 2019-10-22 (×3): qty 1

## 2019-10-22 MED ORDER — ACETAMINOPHEN 325 MG PO TABS: 650.0000 mg | ORAL_TABLET | Freq: Four times a day (QID) | ORAL | Status: DC | PRN

## 2019-10-22 MED ORDER — LORATADINE 10 MG PO TABS: 10.0000 mg | ORAL_TABLET | Freq: Every day | ORAL | Status: DC | PRN

## 2019-10-22 MED ORDER — FUROSEMIDE 20 MG PO TABS: 20.0000 mg | ORAL_TABLET | Freq: Every day | ORAL | Status: DC | PRN

## 2019-10-22 MED ORDER — TAMSULOSIN HCL 0.4 MG PO CAPS
0.4000 mg | ORAL_CAPSULE | Freq: Every day | ORAL | Status: DC
  Administered 2019-10-22 – 2019-10-25 (×4): 0.4 mg via ORAL
  Filled 2019-10-22 (×4): qty 1

## 2019-10-22 MED ORDER — ONDANSETRON HCL 4 MG/2ML IJ SOLN: 4.0000 mg | Freq: Four times a day (QID) | INTRAMUSCULAR | Status: DC | PRN

## 2019-10-22 MED ORDER — SODIUM CHLORIDE 0.9 % IV BOLUS
1000.0000 mL | Freq: Once | INTRAVENOUS | Status: AC
  Administered 2019-10-22: 09:00:00 1000 mL via INTRAVENOUS

## 2019-10-22 MED ORDER — DONEPEZIL HCL 5 MG PO TABS
10.0000 mg | ORAL_TABLET | Freq: Every day | ORAL | Status: DC
  Administered 2019-10-22 – 2019-10-24 (×3): 10 mg via ORAL
  Filled 2019-10-22 (×4): qty 2

## 2019-10-22 MED ORDER — ALBUTEROL SULFATE (2.5 MG/3ML) 0.083% IN NEBU
2.5000 mg | INHALATION_SOLUTION | RESPIRATORY_TRACT | Status: DC | PRN
  Administered 2019-10-25: 13:00:00 2.5 mg via RESPIRATORY_TRACT
  Filled 2019-10-22: qty 3

## 2019-10-22 MED ORDER — MEMANTINE HCL 5 MG PO TABS
10.0000 mg | ORAL_TABLET | Freq: Two times a day (BID) | ORAL | Status: DC
  Administered 2019-10-22 – 2019-10-25 (×7): 10 mg via ORAL
  Filled 2019-10-22 (×7): qty 2

## 2019-10-22 MED ORDER — ADULT MULTIVITAMIN W/MINERALS CH
1.0000 | ORAL_TABLET | Freq: Every day | ORAL | Status: DC
  Administered 2019-10-22 – 2019-10-25 (×4): 1 via ORAL
  Filled 2019-10-22 (×4): qty 1

## 2019-10-22 MED ORDER — ACETAMINOPHEN 650 MG RE SUPP: 650.0000 mg | Freq: Four times a day (QID) | RECTAL | Status: DC | PRN

## 2019-10-22 MED ORDER — ENALAPRIL MALEATE 2.5 MG PO TABS
2.5000 mg | ORAL_TABLET | Freq: Every day | ORAL | Status: DC
  Administered 2019-10-23 – 2019-10-24 (×2): 2.5 mg via ORAL
  Filled 2019-10-22 (×4): qty 1

## 2019-10-22 MED ORDER — ROSUVASTATIN CALCIUM 20 MG PO TABS
40.0000 mg | ORAL_TABLET | Freq: Every day | ORAL | Status: DC
  Administered 2019-10-22 – 2019-10-24 (×3): 40 mg via ORAL
  Filled 2019-10-22: qty 4
  Filled 2019-10-22: qty 2
  Filled 2019-10-22: qty 4
  Filled 2019-10-22 (×3): qty 2
  Filled 2019-10-22: qty 4

## 2019-10-22 MED ORDER — VITAMIN B-12 1000 MCG PO TABS
2500.0000 ug | ORAL_TABLET | Freq: Every day | ORAL | Status: DC
  Administered 2019-10-22 – 2019-10-25 (×4): 2500 ug via ORAL
  Filled 2019-10-22 (×3): qty 3
  Filled 2019-10-22: qty 2.5
  Filled 2019-10-22: qty 3

## 2019-10-22 MED ORDER — SENNOSIDES-DOCUSATE SODIUM 8.6-50 MG PO TABS: 1.0000 | ORAL_TABLET | Freq: Every evening | ORAL | Status: DC | PRN

## 2019-10-22 MED ORDER — ISOSORBIDE MONONITRATE ER 30 MG PO TB24
30.0000 mg | ORAL_TABLET | Freq: Every day | ORAL | Status: DC
  Administered 2019-10-22 – 2019-10-24 (×3): 30 mg via ORAL
  Filled 2019-10-22 (×3): qty 1

## 2019-10-22 MED ORDER — CARVEDILOL 6.25 MG PO TABS
6.2500 mg | ORAL_TABLET | Freq: Two times a day (BID) | ORAL | Status: DC
  Administered 2019-10-22 – 2019-10-25 (×6): 6.25 mg via ORAL
  Filled 2019-10-22 (×6): qty 1

## 2019-10-22 MED ORDER — CHLORHEXIDINE GLUCONATE CLOTH 2 % EX PADS
6.0000 | MEDICATED_PAD | Freq: Every day | CUTANEOUS | Status: DC
  Administered 2019-10-24: 11:00:00 6 via TOPICAL

## 2019-10-22 NOTE — Progress Notes (Signed)
PHARMACY -  BRIEF ANTIBIOTIC NOTE   Pharmacy has received consult(s) for Cefepime and Vancomycin from an ED provider.  The patient's profile has been reviewed for ht/wt/allergies/indication/available labs.    One time order(s) placed for Vancomycin 1 gm, Cefepime 2 gm  Further antibiotics/pharmacy consults should be ordered by admitting physician if indicated.                       Thank you, Ronte Parker A 10/22/2019  9:59 AM

## 2019-10-22 NOTE — H&P (Signed)
[place] [facility] [place] NAME: Chris Simon    MR#:  [id number]  DATE OF BIRTH:  [birth year]  DATE OF ADMISSION:  10/22/2019  PRIMARY CARE PHYSICIAN: [name], MD   REQUESTING/REFERRING PHYSICIAN: Duffy Bruce  Patient coming from :[place]:  patient here after he removed his chronic Foley catheter at [facility]  HISTORY OF PRESENT ILLNESS:  Chris Simon  is a 86 y.o. male with a known history of dementia, BPH with chronic Foley catheter, recent ESBL E. coli UTI in December 2020, COVID-19 infection in December 2020, chronic a fib on eliquis comes to the emergency room from [facility] after he pulled his Foley catheter out.  Pt was recently admitted at [facility] with intermittent urinary retention resulting in recurrent complicated UTIs and treated for ESBL E. coli UTI with seven day course of Ertapenem from 12/20--  12/26. Patient has had issues removing Foley catheter at [facility] requiring re-insertion due to his dementia.   ED course: in the ER, patient got his Foley replaced. He was noted to be hypoxic with saturation 86 to 87 without any respiratory distress or tachypnea ER physician Dr. Ellender Hose is recommending patient being admitted for oxygen and monitoring hypoxia.  Patient was tested positive on 09/14/2023 COVID 19 he was discharged to [facility] without oxygen saturations anywhere from 88 to 90. During that hospitalization recorded discharge summary patient will not leave oxygen on due to his dementia.  Patient according to ER MD drop down saturation into the 70s one time.  pt is being admitted for acute hypoxic respiratory failure secondary to post COVID scarring/fibrosis noted on chest x-ray.  repeat COVID today is negative PAST MEDICAL HISTORY:   Past Medical History:  Diagnosis Date  . Aneurysm ([place])    Abdominal Aortic Aneurysm  . Atrial fibrillation ([place])   . Blockage of a bile duct    . BPH with obstruction/lower urinary tract symptoms   . Cellulitis of scrotum   . COPD (chronic obstructive pulmonary disease) ([place])   . Dementia ([place])   . Diverticulosis   . Dyspnea    with exertion  . Dysrhythmia   . Elevated PSA   . Epididymitis   . Gallstone   . Gross hematuria   . History of kidney stones   . Hyperlipidemia   . Kidney stones   . Nocturia   . Testicular pain   . TIA (transient ischemic attack)    June 2018  . Urinary retention     PAST SURGICAL HISTOIRY:   Past Surgical History:  Procedure Laterality Date  . CHOLECYSTECTOMY N/A 07/11/2015   Procedure: LAPAROSCOPIC CHOLECYSTECTOMY;  Surgeon: Rolm Bookbinder, MD;  Location: [place];  Service: General;  Laterality: N/A;  . ERCP N/A 07/09/2015   Procedure: ENDOSCOPIC RETROGRADE CHOLANGIOPANCREATOGRAPHY (ERCP);  Surgeon: Clarene Essex, MD;  Location: [facility] ENDOSCOPY;  Service: Endoscopy;  Laterality: N/A;  . INGUINAL HERNIA REPAIR Left 04/07/2017   Procedure: HERNIA REPAIR INGUINAL ADULT;  Surgeon: Clayburn Pert, MD;  Location: [facility] ORS;  Service: General;  Laterality: Left;  . INSERTION OF MESH Left 04/07/2017   Procedure: INSERTION OF MESH;  Surgeon: Clayburn Pert, MD;  Location: [facility] OR[facility];  Service: General;  Laterality: Left;  . KYPHOPLASTY N/A 11/16/2018   Procedure: KYPHOPLASTY T12;  Surgeon: Hessie Knows, MD;  Location: [facility] ORS;  Service: Orthopedics;  Laterality: N/A;  . LEFT HEART CATH AND CORONARY ANGIOGRAPHY N/A 01/10/2018   Procedure: LEFT HEART  CATH AND CORONARY ANGIOGRAPHY;  Surgeon: Dionisio David, MD;  Location: [place] [facility];  Service: Cardiovascular;  Laterality: N/A;    SOCIAL HISTORY:   Social History   Tobacco Use  . Smoking status: Former Smoker    Packs/day: 0.50    Types: Cigarettes  . Smokeless tobacco: Never Used  . Tobacco comment: quit 40 years  Substance Use Topics  . Alcohol use: No    Alcohol/week: 0.0 standard drinks    FAMILY HISTORY:   Family History   Problem Relation Age of Onset  . Prostate cancer Brother   . Kidney disease Neg Hx   . Bladder Cancer Neg Hx     DRUG ALLERGIES:   Allergies  Allergen Reactions  . Clindamycin Hcl Rash    Unknown reaction  Reaction: Unknown  Unknown reaction   . Sulfamethoxazole-Trimethoprim Rash    REVIEW OF SYSTEMS:  Review of Systems  Unable to perform ROS: Dementia     MEDICATIONS AT HOME:   Prior to Admission medications   Medication Sig Start Date End Date Taking? Authorizing Provider  amiodarone (PACERONE) 200 MG tablet Take 200 mg by mouth at bedtime.    Yes [provider]  apixaban (ELIQUIS) 5 MG TABS tablet Take 1 tablet (5 mg total) by mouth 2 (two) times daily. 02/21/17  Yes Demetrios Loll, MD  Cyanocobalamin (B-12) 2500 MCG TABS Take 2,500 mg by mouth daily.   Yes [provider]  finasteride (PROSCAR) 5 MG tablet Take 1 tablet (5 mg total) by mouth daily. 07/05/19  Yes McGowan, Larene [place] A, PA-C  memantine (NAMENDA) 10 MG tablet Take 10 mg by mouth 2 (two) times daily.    Yes [provider]  mirtazapine (REMERON) 15 MG tablet Take 15 mg by mouth at bedtime.  04/17/18  Yes [provider]  Multiple Vitamin (MULTIVITAMIN WITH MINERALS) TABS tablet Take 1 tablet by mouth daily.   Yes [provider]  rosuvastatin (CRESTOR) 40 MG tablet Take 40 mg by mouth at bedtime.   Yes [provider]  tamsulosin (FLOMAX) 0.4 MG CAPS capsule Take 1 capsule (0.4 mg total) by mouth daily. 07/05/19  Yes McGowan, Larene [place] A, PA-C  carvedilol (COREG) 6.25 MG tablet Take 6.25 mg by mouth 2 (two) times daily.    [provider]  donepezil (ARICEPT) 10 MG tablet Take 10 mg by mouth at bedtime.    [provider]  enalapril (VASOTEC) 2.5 MG tablet Take 2.5 mg by mouth at bedtime.  04/12/18   [provider]  furosemide (LASIX) 20 MG tablet Take 20 mg by mouth daily as needed for edema.  04/26/18   [provider]   ipratropium-albuterol (DUONEB) 0.5-2.5 (3) MG/3ML SOLN Inhale into the lungs. 05/01/19 04/25/20  [provider]  isosorbide mononitrate (IMDUR) 30 MG 24 hr tablet Take 30 mg by mouth at bedtime.  11/22/16   [provider]  loratadine (CLARITIN) 10 MG tablet Take 10 mg by mouth daily as needed for allergies.    [provider]      VITAL SIGNS:  Blood pressure (!) 113/53, pulse 61, temperature 98.4 F (36.9 C), temperature source Oral, resp. rate 18, SpO2 95 %.  PHYSICAL EXAMINATION:  GENERAL:  86 y.o.-year-old patient lying in the bed with no acute distress.  EYES: Pupils equal, round, reactive to light and accommodation. No scleral icterus.  HEENT: Head atraumatic, normocephalic. Oropharynx and nasopharynx clear.  NECK:  Supple, no jugular venous distention. No thyroid  enlargement, no tenderness.  LUNGS: decreasedbreath sounds bilaterally, no wheezing, rales,rhonchi or crepitation. No use of accessory muscles of respiration.  CARDIOVASCULAR: S1, S2 normal. No murmurs, rubs, or gallops.  ABDOMEN: Soft, nontender, nondistended. Bowel sounds present. No organomegaly or mass.  EXTREMITIES: No pedal edema, cyanosis, or clubbing.  NEUROLOGIC: limited exam due to advanced dementia. Moves all extremities well. No focal weakness. PSYCHIATRIC: The patient is alert and awake SKIN: No obvious rash, lesion, or ulcer.   LABORATORY PANEL:   CBC Recent Labs  Lab 10/22/19 0903  WBC 9.4  HGB 9.3*  HCT 30.1*  PLT 386   ------------------------------------------------------------------------------------------------------------------  Chemistries  Recent Labs  Lab 10/22/19 0903  NA 145  K 3.9  CL 111  CO2 28  GLUCOSE 97  BUN 23  CREATININE 1.33*  CALCIUM 7.5*  AST 20  ALT 19  ALKPHOS 89  BILITOT 0.8   ------------------------------------------------------------------------------------------------------------------  Cardiac Enzymes No results for  input(s): TROPONINI in the last 168 hours. ------------------------------------------------------------------------------------------------------------------  RADIOLOGY:  DG Chest Portable 1 View  Result Date: 10/22/2019 CLINICAL DATA:  Cough and possible hypoxia.  Dementia. EXAM: PORTABLE CHEST 1 VIEW COMPARISON:  02/19/2017 FINDINGS: Lungs are adequately inflated demonstrate patchy bilateral airspace consolidation. No evidence of effusion. Cardiac silhouette is unremarkable. Mild prominence of the right hilum likely due to superimposed parenchymal disease. Remainder of the exam is unchanged. IMPRESSION: Bilateral patchy multifocal airspace process likely multifocal pneumonia. Recommend follow-up to resolution. Electronically Signed   By: Marin Olp M.D.   On: 10/22/2019 08:59    EKG:    IMPRESSION AND PLAN:   Villa Hollrah  is a 86 y.o. male with a known history of dementia, BPH with chronic Foley catheter, recent ESBL E. coli UTI in December 2020, COVID-19 infection in December 2020, chronic a fib on eliquis comes to the emergency room from [facility] after he pulled his Foley catheter out. Patient was found to have hypoxia with saturations in 86 to 87 on room air without respiratory distress patient is being admitted for  1. Acute hypoxic respiratory failure secondary to post COVID scarring/fibrosis/chronic x-ray changes noted -patient is afebrile, not in respiratory distress, Pro calcitonin 0.1 -patient received dose of vancomycin and cefepime in the ER-- I'm holding of further antibiotic dose -PRN nebulizer/inhaler -wean oxygen to room air if able to -patient may require oxygen to go home with at [facility]  2. Recent ESBL E. coli UTI with history of urinary retention secondary to BPH requiring chronic Foley catheter placement at [facility] in December 2020 -urine positive for WBC -given chronic Foley he will have some colonization and holding of further antibiotics -just finished  the course of IV ertapenem from dec 20-26 [facility] -consider infectious disease consultation if needed -Foley care per protocol  3. Paroxysmal atrial fibrillation -continue amiodarone, Coreg -continue eliquis  4. History of dementia continue Namenda and Aricept  5. Hyperlipidemia on Crestor  6. BPH -continue Flomax and Proscar  7. Generalized weakness -physical therapy   Patient will return back to [facility] likely in 1 to 2 days if remains stable  Family Communication : spoke with daughter Veleta Miners Consults : Code Status : full code per daughter DVT prophylaxis : eliquis  TOTAL TIME TAKING CARE OF THIS PATIENT: *50* minutes.    Fritzi Mandes M.D  [facility]     CC: Primary care physician; Chris Hughs Chrissie Noa, MD

## 2019-10-22 NOTE — ED Triage Notes (Signed)
Pt from [facility] for removing his urinary catheter this am. PT hx of dementia, at baseline.

## 2019-10-22 NOTE — ED Provider Notes (Signed)
[facility] Emergency Department Provider Note  ____________________________________________   First MD Initiated Contact with Patient 10/22/19 [phone]     (approximate)  I have reviewed the triage vital signs and the nursing notes.   HISTORY  Chief Complaint No chief complaint on file.    HPI Chris Simon is a 86 y.o. male with extensive past medical history as below including A. fib, aortic aneurysm, chronic urinary retention with Foley,  status post recent admission for COVID-19 in December, here with reported pulling out his Foley.  Per report, the patient pulled out his Foley this morning.  Is a history of similar episodes.  He began bleeding from his urethral meatus and has been complaining of pain since then.  He has also reportedly had a mild cough, though they were attributing this to coronavirus.  He is not currently on oxygen.  He has a history of dementia, limiting further history.     Level 5 caveat invoked as remainder of history, ROS, and physical exam limited due to patient's dementia.   Past Medical History:  Diagnosis Date  . Aneurysm ([place])    Abdominal Aortic Aneurysm  . Atrial fibrillation ([place])   . Blockage of a bile duct   . BPH with obstruction/lower urinary tract symptoms   . Cellulitis of scrotum   . COPD (chronic obstructive pulmonary disease) ([place])   . Dementia ([place])   . Diverticulosis   . Dyspnea    with exertion  . Dysrhythmia   . Elevated PSA   . Epididymitis   . Gallstone   . Gross hematuria   . History of kidney stones   . Hyperlipidemia   . Kidney stones   . Nocturia   . Testicular pain   . TIA (transient ischemic attack)    June 2018  . Urinary retention     Patient Active Problem List   Diagnosis Date Noted  . Acute hypoxemic respiratory failure ([place]) 10/22/2019  . Chronic indwelling Foley catheter   . Alzheimer's dementia without behavioral disturbance ([place])   . Pneumonia 02/20/2017  . TIA  (transient ischemic attack) 02/18/2017  . Left inguinal hernia 06/22/2016  . Umbilical hernia without obstruction and without gangrene 06/22/2016  . Calculus of kidney 09/03/2015  . Testicular cyst 09/03/2015  . Sepsis secondary to UTI ([place]) 07/20/2015  . BPH (benign prostatic hyperplasia) 07/18/2015  . Elevated PSA 07/18/2015  . Sepsis ([place]) 07/09/2015  . Acute kidney injury ([place])   . Paroxysmal A-fib ([place])   . UTI (lower urinary tract infection)   . Atrial fibrillation ([place]) 10/01/2014  . Pure hypercholesterolemia 10/01/2014  . Benign fibroma of prostate 10/06/2012  . Bladder retention 10/06/2012    Past Surgical History:  Procedure Laterality Date  . CHOLECYSTECTOMY N/A 07/11/2015   Procedure: LAPAROSCOPIC CHOLECYSTECTOMY;  Surgeon: Rolm Bookbinder, MD;  Location: [place];  Service: General;  Laterality: N/A;  . ERCP N/A 07/09/2015   Procedure: ENDOSCOPIC RETROGRADE CHOLANGIOPANCREATOGRAPHY (ERCP);  Surgeon: Clarene Essex, MD;  Location: [facility] ENDOSCOPY;  Service: Endoscopy;  Laterality: N/A;  . INGUINAL HERNIA REPAIR Left 04/07/2017   Procedure: HERNIA REPAIR INGUINAL ADULT;  Surgeon: Clayburn Pert, MD;  Location: [facility] ORS;  Service: General;  Laterality: Left;  . INSERTION OF MESH Left 04/07/2017   Procedure: INSERTION OF MESH;  Surgeon: Clayburn Pert, MD;  Location: [facility] ORS;  Service: General;  Laterality: Left;  . KYPHOPLASTY N/A 11/16/2018   Procedure: KYPHOPLASTY T12;  Surgeon: Hessie Knows, MD;  Location: [facility] ORS;  Service: Orthopedics;  Laterality: N/A;  . LEFT HEART CATH AND CORONARY ANGIOGRAPHY N/A 01/10/2018   Procedure: LEFT HEART CATH AND CORONARY ANGIOGRAPHY;  Surgeon: Dionisio David, MD;  Location: [facility] LAB;  Service: Cardiovascular;  Laterality: N/A;    Prior to Admission medications   Medication Sig Start Date End Date Taking? Authorizing Provider  amiodarone (PACERONE) 200 MG tablet Take 200 mg by mouth at bedtime.    Yes [provider]    apixaban (ELIQUIS) 5 MG TABS tablet Take 1 tablet (5 mg total) by mouth 2 (two) times daily. 02/21/17  Yes Demetrios Loll, MD  Cyanocobalamin (B-12) 2500 MCG TABS Take 2,500 mg by mouth daily.   Yes [provider]  finasteride (PROSCAR) 5 MG tablet Take 1 tablet (5 mg total) by mouth daily. 07/05/19  Yes McGowan, Larene Beach A, PA-C  memantine (NAMENDA) 10 MG tablet Take 10 mg by mouth 2 (two) times daily.    Yes [provider]  mirtazapine (REMERON) 15 MG tablet Take 15 mg by mouth at bedtime.  04/17/18  Yes [provider]  Multiple Vitamin (MULTIVITAMIN WITH MINERALS) TABS tablet Take 1 tablet by mouth daily.   Yes [provider]  rosuvastatin (CRESTOR) 40 MG tablet Take 40 mg by mouth at bedtime.   Yes [provider]  tamsulosin (FLOMAX) 0.4 MG CAPS capsule Take 1 capsule (0.4 mg total) by mouth daily. 07/05/19  Yes McGowan, Larene Beach A, PA-C  carvedilol (COREG) 6.25 MG tablet Take 6.25 mg by mouth 2 (two) times daily.    [provider]  donepezil (ARICEPT) 10 MG tablet Take 10 mg by mouth at bedtime.    [provider]  enalapril (VASOTEC) 2.5 MG tablet Take 2.5 mg by mouth at bedtime.  04/12/18   [provider]  furosemide (LASIX) 20 MG tablet Take 20 mg by mouth daily as needed for edema.  04/26/18   [provider]  ipratropium-albuterol (DUONEB) 0.5-2.5 (3) MG/3ML SOLN Inhale into the lungs. 05/01/19 04/25/20  [provider]  isosorbide mononitrate (IMDUR) 30 MG 24 hr tablet Take 30 mg by mouth at bedtime.  11/22/16   [provider]  loratadine (CLARITIN) 10 MG tablet Take 10 mg by mouth daily as needed for allergies.    [provider]    Allergies Clindamycin hcl and Sulfamethoxazole-trimethoprim  Family History  Problem Relation Age of Onset  . Prostate cancer Brother   . Kidney disease Neg Hx   . Bladder Cancer Neg Hx     Social History Social History   Tobacco Use  . Smoking  status: Former Smoker    Packs/day: 0.50    Types: Cigarettes  . Smokeless tobacco: Never Used  . Tobacco comment: quit 40 years  Substance Use Topics  . Alcohol use: No    Alcohol/week: 0.0 standard drinks  . Drug use: No    Review of Systems  Review of Systems  Unable to perform ROS: Dementia     ____________________________________________  PHYSICAL EXAM:      VITAL SIGNS: ED Triage Vitals [10/22/19 0804]  Enc Vitals Group     BP (!) 114/57     Pulse Rate 65     Resp 14     Temp 98.4 F (36.9 C)     Temp Source Oral     SpO2 (!) 89 %     Weight      Height      Head Circumference  Peak Flow      Pain Score      Pain Loc      Pain Edu?      Excl. in [place]?      Physical Exam Vitals and nursing note reviewed.  Constitutional:      General: He is not in acute distress.    Appearance: He is well-developed.     Comments: Chronically ill appearing elderly male  HENT:     Head: Normocephalic and atraumatic.  Eyes:     Conjunctiva/sclera: Conjunctivae normal.  Cardiovascular:     Rate and Rhythm: Normal rate and regular rhythm.     Heart sounds: Normal heart sounds.  Pulmonary:     Effort: Tachypnea and respiratory distress present.     Breath sounds: Examination of the right-middle field reveals rales. Examination of the right-lower field reveals rales. Examination of the left-lower field reveals rales. Decreased breath sounds and rales present. No wheezing.  Abdominal:     General: There is no distension.  Genitourinary:    Comments: Moderate amount of blood through urethral meatus Musculoskeletal:     Cervical back: Neck supple.  Skin:    General: Skin is warm.     Capillary Refill: Capillary refill takes less than 2 seconds.     Findings: No rash.  Neurological:     Mental Status: He is alert and oriented to person, place, and time.     Motor: No abnormal muscle tone.       ____________________________________________   LABS (all labs  ordered are listed, but only abnormal results are displayed)  Labs Reviewed  CBC WITH DIFFERENTIAL/PLATELET - Abnormal; Notable for the following components:      Result Value   RBC 3.07 (*)    Hemoglobin 9.3 (*)    HCT 30.1 (*)    Abs Immature Granulocytes 0.11 (*)    All other components within normal limits  COMPREHENSIVE METABOLIC PANEL - Abnormal; Notable for the following components:   Creatinine, Ser 1.33 (*)    Calcium 7.5 (*)    Total Protein 5.3 (*)    Albumin 1.7 (*)    GFR calc non Af Amer 49 (*)    GFR calc Af Amer 57 (*)    All other components within normal limits  URINALYSIS, COMPLETE (UACMP) WITH MICROSCOPIC - Abnormal; Notable for the following components:   Color, Urine YELLOW (*)    APPearance CLOUDY (*)    Hgb urine dipstick LARGE (*)    Leukocytes,Ua MODERATE (*)    RBC / HPF >50 (*)    WBC, UA >50 (*)    Bacteria, UA RARE (*)    All other components within normal limits  CULTURE, BLOOD (ROUTINE X 2)  CULTURE, BLOOD (ROUTINE X 2)  LACTIC ACID, PLASMA  PROCALCITONIN  POC SARS CORONAVIRUS 2 AG -  ED  POC SARS CORONAVIRUS 2 AG    ____________________________________________  EKG:  ________________________________________  RADIOLOGY All imaging, including plain films, CT scans, and ultrasounds, independently reviewed by me, and interpretations confirmed via formal radiology reads.  ED MD interpretation:   CXR: Multifocal PNA, likely COVID-19  Official radiology report(s): DG Chest Portable 1 View  Result Date: 10/22/2019 CLINICAL DATA:  Cough and possible hypoxia.  Dementia. EXAM: PORTABLE CHEST 1 VIEW COMPARISON:  02/19/2017 FINDINGS: Lungs are adequately inflated demonstrate patchy bilateral airspace consolidation. No evidence of effusion. Cardiac silhouette is unremarkable. Mild prominence of the right hilum likely due to superimposed parenchymal disease. Remainder of the  exam is unchanged. IMPRESSION: Bilateral patchy multifocal airspace process  likely multifocal pneumonia. Recommend follow-up to resolution. Electronically Signed   By: Marin Olp M.D.   On: 10/22/2019 08:59    ____________________________________________  PROCEDURES   Procedure(s) performed (including Critical Care):  Procedures  ____________________________________________  INITIAL IMPRESSION / MDM / [place] / ED COURSE  As part of my medical decision making, I reviewed the following data within the [place] notes reviewed and incorporated, Old chart reviewed, Notes from prior ED visits, and [place] Controlled Substance Database       *Chris Simon was evaluated in Emergency Department on 10/22/2019 for the symptoms described in the history of present illness. He was evaluated in the context of the global COVID-19 pandemic, which necessitated consideration that the patient might be at risk for infection with the SARS-CoV-2 virus that causes COVID-19. Institutional protocols and algorithms that pertain to the evaluation of patients at risk for COVID-19 are in a state of rapid change based on information released by regulatory bodies including the CDC and federal and state organizations. These policies and algorithms were followed during the patient's care in the ED.  Some ED evaluations and interventions may be delayed as a result of limited staffing during the pandemic.*  Clinical Course as of Oct 22 1607  Mon Oct 22, 4943  2764 86 year old male here with initial report of pulling his catheter out.  However, he was hypoxic to the mid to upper 80s on room air here.  Chest x-ray shows multifocal pneumonia.  Of note, he did recently have Covid in December.  Patient started on broad-spectrum antibiotics, fluids.   [CI]    Clinical Course User Index [CI] Duffy Bruce, MD    Medical Decision Making:  86 yo M here with urethral trauma from pulling out his foley. Foley replaced and secured. Of note, pt was recently  hospitalized for COVID-19 and is persistently hypoxic here. He does not have O2 at his facility. Pt was monitored off O2 and desatted to low 80s. Will admit for hypoxia, likely sequelae of recent COVID with ongoing PNA, for which pt was started on ABX. Less likely PE based on history, exam.   ____________________________________________  FINAL CLINICAL IMPRESSION(S) / ED DIAGNOSES  Final diagnoses:  Acute respiratory failure with hypoxia ([place])     MEDICATIONS GIVEN DURING THIS VISIT:  Medications  amiodarone (PACERONE) tablet 200 mg (has no administration in time range)  carvedilol (COREG) tablet 6.25 mg (6.25 mg Oral Given 10/22/19 1429)  enalapril (VASOTEC) tablet 2.5 mg (has no administration in time range)  furosemide (LASIX) tablet 20 mg (has no administration in time range)  isosorbide mononitrate (IMDUR) 24 hr tablet 30 mg (has no administration in time range)  rosuvastatin (CRESTOR) tablet 40 mg (has no administration in time range)  donepezil (ARICEPT) tablet 10 mg (has no administration in time range)  memantine (NAMENDA) tablet 10 mg (10 mg Oral Given 10/22/19 1429)  finasteride (PROSCAR) tablet 5 mg (has no administration in time range)  tamsulosin (FLOMAX) capsule 0.4 mg (0.4 mg Oral Given 10/22/19 1429)  apixaban (ELIQUIS) tablet 5 mg (5 mg Oral Given 10/22/19 1429)  vitamin B-12 (CYANOCOBALAMIN) tablet 2,500 mcg (has no administration in time range)  multivitamin with minerals tablet 1 tablet (1 tablet Oral Given 10/22/19 1429)  loratadine (CLARITIN) tablet 10 mg (has no administration in time range)  acetaminophen (TYLENOL) tablet 650 mg (has no administration in time range)    Or  acetaminophen (TYLENOL) suppository 650 mg (has no administration in time range)  senna-docusate (Senokot-S) tablet 1 tablet (has no administration in time range)  ondansetron (ZOFRAN) tablet 4 mg (has no administration in time range)    Or  ondansetron (ZOFRAN) injection 4 mg (has no  administration in time range)  albuterol (PROVENTIL) (2.5 MG/3ML) 0.083% nebulizer solution 2.5 mg (has no administration in time range)  sodium chloride 0.9 % bolus 1,000 mL (0 mLs Intravenous Stopped 10/22/19 1107)  ceFEPIme (MAXIPIME) 2 g in sodium chloride 0.9 % 100 mL IVPB (0 g Intravenous Stopped 10/22/19 1207)  vancomycin (VANCOCIN) IVPB 1000 mg/200 mL premix (0 mg Intravenous Stopped 10/22/19 1325)     ED Discharge Orders    None       Note:  This document was prepared using Dragon voice recognition software and may include unintentional dictation errors.   Duffy Bruce, MD 10/22/19 [phone]

## 2019-10-23 ENCOUNTER — Other Ambulatory Visit: Payer: Self-pay

## 2019-10-23 DIAGNOSIS — I480 Paroxysmal atrial fibrillation: Secondary | ICD-10-CM

## 2019-10-23 DIAGNOSIS — Z8659 Personal history of other mental and behavioral disorders: Secondary | ICD-10-CM

## 2019-10-23 DIAGNOSIS — R531 Weakness: Secondary | ICD-10-CM

## 2019-10-23 DIAGNOSIS — J9601 Acute respiratory failure with hypoxia: Principal | ICD-10-CM

## 2019-10-23 DIAGNOSIS — N400 Enlarged prostate without lower urinary tract symptoms: Secondary | ICD-10-CM

## 2019-10-23 LAB — MRSA PCR SCREENING: MRSA by PCR: NEGATIVE

## 2019-10-23 LAB — PROCALCITONIN: Procalcitonin: <0.1 ng/mL

## 2019-10-23 MED ORDER — LORAZEPAM 2 MG/ML IJ SOLN
0.5000 mg | Freq: Once | INTRAMUSCULAR | Status: AC
  Administered 2019-10-23: 21:00:00 0.5 mg via INTRAVENOUS
  Filled 2019-10-23: qty 1

## 2019-10-23 MED ORDER — SODIUM CHLORIDE 0.9 % IV SOLN
2.0000 g | Freq: Two times a day (BID) | INTRAVENOUS | Status: DC
  Administered 2019-10-23: 16:00:00 2 g via INTRAVENOUS
  Filled 2019-10-23 (×3): qty 2

## 2019-10-23 MED ORDER — SODIUM CHLORIDE 0.9 % IV SOLN
1.0000 g | Freq: Three times a day (TID) | INTRAVENOUS | Status: DC
  Filled 2019-10-23 (×2): qty 1

## 2019-10-23 NOTE — NC FL2 (Signed)
[place] LEVEL OF CARE SCREENING TOOL     IDENTIFICATION  Patient Name: Chris Simon Birthdate: 05/07/1934 Sex: male Admission Date (Current Location): 10/22/2019  [place] and [place] Number:  Engineering geologist and Address:  [facility], [address], [place], [place] [address]      Provider Number: [id number]  Attending Physician Name and Address:  Nolberto Hanlon, MD  Relative Name and Phone Number:       Current Level of Care: Hospital Recommended Level of Care: [place] Prior Approval Number:    Date Approved/Denied:   PASRR Number:    Discharge Plan: SNF    Current Diagnoses: Patient Active Problem List   Diagnosis Date Noted  . Acute hypoxemic respiratory failure ([place]) 10/22/2019  . Chronic indwelling Foley catheter   . Alzheimer's dementia without behavioral disturbance ([place])   . Pneumonia 02/20/2017  . TIA (transient ischemic attack) 02/18/2017  . Left inguinal hernia 06/22/2016  . Umbilical hernia without obstruction and without gangrene 06/22/2016  . Calculus of kidney 09/03/2015  . Testicular cyst 09/03/2015  . Sepsis secondary to UTI ([place]) 07/20/2015  . BPH (benign prostatic hyperplasia) 07/18/2015  . Elevated PSA 07/18/2015  . Sepsis ([place]) 07/09/2015  . Acute kidney injury ([place])   . Paroxysmal A-fib ([place])   . UTI (lower urinary tract infection)   . Atrial fibrillation ([place]) 10/01/2014  . Pure hypercholesterolemia 10/01/2014  . Benign fibroma of prostate 10/06/2012  . Bladder retention 10/06/2012    Orientation RESPIRATION BLADDER Height & Weight     (disoriented x4)  O2([place] 2L) Indwelling catheter, Incontinent(placed 1/25) Weight: 165 lb 9.1 oz (75.1 kg) Height:  6' (182.9 cm)  BEHAVIORAL SYMPTOMS/MOOD NEUROLOGICAL BOWEL NUTRITION STATUS      Continent Diet(Diet 2 Gram Sodium)  AMBULATORY STATUS COMMUNICATION OF NEEDS Skin   Limited Assist Verbally Normal                        Personal Care Assistance Level of Assistance  Bathing, Feeding, Dressing Bathing Assistance: Limited assistance Feeding assistance: Independent Dressing Assistance: Limited assistance     Functional Limitations Info  Sight, Hearing, Speech Sight Info: Adequate Hearing Info: Adequate Speech Info: Adequate    SPECIAL CARE FACTORS FREQUENCY  PT (By licensed PT), OT (By licensed OT)     PT Frequency: 5x OT Frequency: 5x            Contractures Contractures Info: Not present    Additional Factors Info  Code Status, Allergies, Isolation Precautions Code Status Info: Full Code Allergies Info: Clindamycin Hcl, Sulfamethoxazole-trimethoprim     Isolation Precautions Info: Contact pre     Current Medications (10/23/2019):  This is the current hospital active medication list Current Facility-Administered Medications  Medication Dose Route Frequency Provider Last Rate Last Admin  . acetaminophen (TYLENOL) tablet 650 mg  650 mg Oral Q6H PRN Fritzi Mandes, MD       Or  . acetaminophen (TYLENOL) suppository 650 mg  650 mg Rectal Q6H PRN Fritzi Mandes, MD      . albuterol (PROVENTIL) (2.5 MG/3ML) 0.083% nebulizer solution 2.5 mg  2.5 mg Nebulization Q2H PRN Fritzi Mandes, MD      . amiodarone (PACERONE) tablet 200 mg  200 mg Oral QHS Fritzi Mandes, MD   200 mg at 10/22/19 2309  . apixaban (ELIQUIS) tablet 5 mg  5 mg Oral BID Fritzi Mandes, MD   5 mg at 10/23/19 1006  .  carvedilol (COREG) tablet 6.25 mg  6.25 mg Oral BID Fritzi Mandes, MD   6.25 mg at 10/23/19 1006  . ceFEPIme (MAXIPIME) 2 g in sodium chloride 0.9 % 100 mL IVPB  2 g Intravenous Q12H Dallie Piles, RPH      . Chlorhexidine Gluconate Cloth 2 % PADS 6 each  6 each Topical Daily Fritzi Mandes, MD      . donepezil (ARICEPT) tablet 10 mg  10 mg Oral QHS Fritzi Mandes, MD   10 mg at 10/22/19 2316  . enalapril (VASOTEC) tablet 2.5 mg  2.5 mg Oral QHS Fritzi Mandes, MD      . finasteride (PROSCAR) tablet 5 mg  5 mg Oral Daily Fritzi Mandes,  MD   5 mg at 10/23/19 1006  . furosemide (LASIX) tablet 20 mg  20 mg Oral Daily PRN Fritzi Mandes, MD      . isosorbide mononitrate (IMDUR) 24 hr tablet 30 mg  30 mg Oral QHS Fritzi Mandes, MD   30 mg at 10/22/19 2310  . loratadine (CLARITIN) tablet 10 mg  10 mg Oral Daily PRN Fritzi Mandes, MD      . memantine [facility]) tablet 10 mg  10 mg Oral BID Fritzi Mandes, MD   10 mg at 10/23/19 1006  . multivitamin with minerals tablet 1 tablet  1 tablet Oral Daily Fritzi Mandes, MD   1 tablet at 10/23/19 1006  . ondansetron (ZOFRAN) tablet 4 mg  4 mg Oral Q6H PRN Fritzi Mandes, MD       Or  . ondansetron [facility]) injection 4 mg  4 mg Intravenous Q6H PRN Fritzi Mandes, MD      . rosuvastatin (CRESTOR) tablet 40 mg  40 mg Oral QHS Fritzi Mandes, MD   40 mg at 10/22/19 2308  . senna-docusate (Senokot-S) tablet 1 tablet  1 tablet Oral QHS PRN Fritzi Mandes, MD      . tamsulosin (FLOMAX) capsule 0.4 mg  0.4 mg Oral Daily Fritzi Mandes, MD   0.4 mg at 10/23/19 1006  . vitamin B-12 (CYANOCOBALAMIN) tablet 2,500 mcg  2,500 mcg Oral Daily Fritzi Mandes, MD   2,500 mcg at 10/23/19 1006     Discharge Medications: Please see discharge summary for a list of discharge medications.  Relevant Imaging Results:  Relevant Lab Results:   Additional Information [id number]  Gerrianne Scale Alizabeth Antonio, LCSW

## 2019-10-23 NOTE — Progress Notes (Signed)
PHARMACY NOTE:  ANTIMICROBIAL RENAL DOSAGE ADJUSTMENT  Current antimicrobial regimen includes a mismatch between antimicrobial dosage and estimated renal function.  As per policy approved by the Pharmacy & Therapeutics and Medical Executive Committees, the antimicrobial dosage will be adjusted accordingly.  Current antimicrobial dosage:  Cefepime 1 gram IV every 8 hours  Indication: PNA  Renal Function:  Estimated Creatinine Clearance: 44.7 mL/min (A) (by C-G formula based on SCr of 1.33 mg/dL (H)). []      On intermittent HD, scheduled: []      On CRRT    Antimicrobial dosage has been changed to:  Cefepime 2 grams IV every 12 hours  Additional comments:   Thank you for allowing pharmacy to be a part of this patient's care.  Dallie Piles, [facility] 10/23/2019 2:54 PM

## 2019-10-23 NOTE — Plan of Care (Signed)
°  Problem: Coping: °Goal: Level of anxiety will decrease °Outcome: Progressing °  °

## 2019-10-23 NOTE — Progress Notes (Signed)
PROGRESS NOTE    Chris Simon  [id number] DOB: 10-19-1933 DOA: 10/22/2019 PCP: [name], MD    Brief Narrative:  Chris Simon  is a 86 y.o. male with a known history of dementia, BPH with chronic Foley catheter, recent ESBL E. coli UTI in December 2020, COVID-19 infection in December 2020, chronic a fib on eliquis comes to the emergency room from [facility] after he pulled his Foley catheter out. Pt was recently admitted at [facility] with intermittent urinary retention resulting in recurrent complicated UTIs and treated for ESBL E. coli UTI with seven day course of Ertapenem from 12/20--  12/26. Patient has had issues removing Foley catheter at [facility] requiring re-insertion due to his dementia. ED course: in the ER, patient got his Foley replaced. He was noted to be hypoxic with saturation 86 to 87 without any respiratory distress or tachypnea ER physician Dr. Ellender Hose is recommending patient being admitted for oxygen and monitoring hypoxia.  Patient was tested positive on 09/14/2023 COVID 19 he was discharged to [facility] without oxygen saturations anywhere from 88 to 90. During that hospitalization recorded discharge summary patient will not leave oxygen on due to his dementia.    Consultants:   None  Procedures: none  Antimicrobials:   Cefepime and vancomycin   Subjective: Patient is sleepy this a.m.  Tries to answer me but he falls asleep.  He pulled his blanket when I was trying to examine him.  NAD  Objective: Vitals:   10/22/19 1821 10/22/19 1840 10/22/19 2229 10/23/19 0547  BP:  (!) 125/54 104/61 106/63  Pulse:  62 65 68  Resp:  16 20 20   Temp: 97.9 F (36.6 C) 98.1 F (36.7 C) 98 F (36.7 C) 97.8 F (36.6 C)  TempSrc: Oral Oral Oral Oral  SpO2:  96% 93% 92%  Weight:    75.1 kg  Height:    6' (1.829 m)    Intake/Output Summary (Last 24 hours) at 10/23/2019 0744 Last data filed at 10/22/2019 2315 Gross per 24 hour  Intake 540 ml  Output  --  Net 540 ml   Filed Weights   10/23/19 0547  Weight: 75.1 kg    Examination:  General exam: Appears calm and comfortable, laying in bed sleepy NAD Respiratory system: Clear anteriorly with poor respiratory effort  Cardiovascular system: S1 & S2 heard, RRR. No JVD, murmurs, rubs, gallops or clicks.  Gastrointestinal system: Abdomen is nondistended, soft and nontender.Normal bowel sounds heard. Central nervous system: Unable to assess since he falls asleep during exam Extremities: No edema Skin: Warm dry Psychiatry: Unable to assess    Data Reviewed: I have personally reviewed following labs and imaging studies  CBC: Recent Labs  Lab 10/22/19 0903  WBC 9.4  NEUTROABS 6.7  HGB 9.3*  HCT 30.1*  MCV 98.0  PLT [id number]   Basic Metabolic Panel: Recent Labs  Lab 10/22/19 0903  NA 145  K 3.9  CL 111  CO2 28  GLUCOSE 97  BUN 23  CREATININE 1.33*  CALCIUM 7.5*   GFR: Estimated Creatinine Clearance: 44.7 mL/min (A) (by C-G formula based on SCr of 1.33 mg/dL (H)). Liver Function Tests: Recent Labs  Lab 10/22/19 0903  AST 20  ALT 19  ALKPHOS 89  BILITOT 0.8  PROT 5.3*  ALBUMIN 1.7*   No results for input(s): LIPASE, AMYLASE in the last 168 hours. No results for input(s): AMMONIA in the last 168 hours. Coagulation Profile: No results for input(s): INR, PROTIME in  the last 168 hours. Cardiac Enzymes: No results for input(s): CKTOTAL, CKMB, CKMBINDEX, TROPONINI in the last 168 hours. BNP (last 3 results) No results for input(s): PROBNP in the last 8760 hours. HbA1C: No results for input(s): HGBA1C in the last 72 hours. CBG: No results for input(s): GLUCAP in the last 168 hours. Lipid Profile: No results for input(s): CHOL, HDL, LDLCALC, TRIG, CHOLHDL, LDLDIRECT in the last 72 hours. Thyroid Function Tests: No results for input(s): TSH, T4TOTAL, FREET4, T3FREE, THYROIDAB in the last 72 hours. Anemia Panel: No results for input(s): VITAMINB12, FOLATE, FERRITIN,  TIBC, IRON, RETICCTPCT in the last 72 hours. Sepsis Labs: Recent Labs  Lab 10/22/19 0903 10/23/19 0501  PROCALCITON 0.10 <0.10  LATICACIDVEN 0.8  --     Recent Results (from the past 240 hour(s))  Blood culture (routine x 2)     Status: None (Preliminary result)   Collection Time: 10/22/19  9:04 AM   Specimen: BLOOD  Result Value Ref Range Status   Specimen Description BLOOD RIGHT ANTECUBITAL  Final   Special Requests   Final    BOTTLES DRAWN AEROBIC AND ANAEROBIC Blood Culture adequate volume   Culture   Final    NO GROWTH < 24 HOURS Performed at [facility], [address], [place], [place] [address]    Report Status PENDING  Incomplete  Blood culture (routine x 2)     Status: None (Preliminary result)   Collection Time: 10/22/19 11:00 AM   Specimen: BLOOD  Result Value Ref Range Status   Specimen Description BLOOD RIGHT AC  Final   Special Requests   Final    BOTTLES DRAWN AEROBIC AND ANAEROBIC Blood Culture adequate volume   Culture   Final    NO GROWTH < 24 HOURS Performed at [facility], [address] [place], [place] [address]    Report Status PENDING  Incomplete  MRSA PCR Screening     Status: None   Collection Time: 10/23/19  5:55 AM   Specimen: Nasal Mucosa; Nasopharyngeal  Result Value Ref Range Status   MRSA by PCR NEGATIVE NEGATIVE Final    Comment:        The GeneXpert MRSA Assay (FDA approved for NASAL specimens only), is one component of a comprehensive MRSA colonization surveillance program. It is not intended to diagnose MRSA infection nor to guide or monitor treatment for MRSA infections. Performed at [facility], [address], [place], [place] [address]          Radiology Studies: DG Chest Portable 1 View  Result Date: 10/22/2019 CLINICAL DATA:  Cough and possible hypoxia.  Dementia. EXAM: PORTABLE CHEST 1 VIEW COMPARISON:  02/19/2017 FINDINGS: Lungs are adequately inflated demonstrate patchy  bilateral airspace consolidation. No evidence of effusion. Cardiac silhouette is unremarkable. Mild prominence of the right hilum likely due to superimposed parenchymal disease. Remainder of the exam is unchanged. IMPRESSION: Bilateral patchy multifocal airspace process likely multifocal pneumonia. Recommend follow-up to resolution. Electronically Signed   By: Marin Olp M.D.   On: 10/22/2019 08:59        Scheduled Meds: . amiodarone  200 mg Oral QHS  . apixaban  5 mg Oral BID  . carvedilol  6.25 mg Oral BID  . Chlorhexidine Gluconate Cloth  6 each Topical Daily  . donepezil  10 mg Oral QHS  . enalapril  2.5 mg Oral QHS  . finasteride  5 mg Oral Daily  . isosorbide mononitrate  30 mg Oral QHS  .  memantine  10 mg Oral BID  . multivitamin with minerals  1 tablet Oral Daily  . rosuvastatin  40 mg Oral QHS  . tamsulosin  0.4 mg Oral Daily  . vitamin B-12  2,500 mcg Oral Daily   Continuous Infusions:  Assessment & Plan:   Active Problems:   Paroxysmal A-fib (HCC)   Acute hypoxemic respiratory failure (HCC)   1. Acute hypoxic respiratory failure secondary to post COVID , cxr with multifocal pna, can this be scarring? Post covid Will Continue vanco and cefepime F/u blood cultures Try to wean off oxygen Pro calcitonin 0.1? -PRN nebulizer/inhaler -patient may require oxygen to go home with at [facility] Check am labs  2. Recent ESBL E. coli UTI with history of urinary retention secondary to BPH requiring chronic Foley catheter placement at [facility] in December 2020 -urine positive for WBC -given chronic Foley he will have some colonization and holding of further antibiotics for UTI -just finished the course of [facility]ap[facility] -consider infectious disease consultation if needed -Foley care per protocol  3. Paroxysmal atrial fibrillation -continue amiodarone, Coreg -continue eliquis  4. History of dementia continue Namenda and Aricept  5.  Hyperlipidemia on Crestor  6. BPH -continue Flomax and Proscar  7. Generalized weakness -physical therapy-rec. snf   DVT prophylaxis: Eliquis Code Status: Full code Family Communication: None at bedside Disposition Plan: will need SNF on discharge, possible d/c in 1-2 days if clinically improves from pna stand point       LOS: 1 day   Time spent: 45 minutes with more than 50% COC    Nolberto Hanlon, MD [facility] Pager [phone]  If 7PM-7AM, please contact night-coverage [email or web address] Password [facility] 10/23/2019, 7:44 AM

## 2019-10-23 NOTE — Evaluation (Signed)
Physical Therapy Evaluation Patient Details Name: Chris Simon MRN: [id number] DOB: 1934/07/17 Today's Date: 10/23/2019   History of Present Illness  Pt is 86 y.o. male with a known history of dementia, AAA,afib,COPD,HLD, TIA with chronic Foley catheter, recent ESBL E. coli UTI in December 2020, COVID-19 infection in December 2020, chronic a fib on eliquis comes to the emergency room from [facility] after he pulled his Foley catheter out. pt is being admitted for acute hypoxic respiratory failure secondary to post COVID scarring/fibrosis noted on chest x-ray.    Clinical Impression  Patient easily woken, oriented to self, disoriented to time, place, situation. Denied pain. The patient demonstrated bed mobility with minA and sat EOB for several minutes with BLE and BUE support. Pt needed to go from 2L to 4L in sitting prior to continued mobility, on 4L during transfer, and returned to 2L once rested up in chair. Sit <> stand with RW and minA, pt pulled heavily on RW. Pt exhibited some unsteadiness while transferring to chair. CGA throughout for safety. Pt exhibited some fatigue and SOB after transfer.  Overall the patient demonstrated deficits (see "PT Problem List") that impede the patient's functional abilities, safety, and mobility and would benefit from skilled PT intervention.      Follow Up Recommendations SNF    Equipment Recommendations  Other (comment)(TBD)    Recommendations for Other Services       Precautions / Restrictions Precautions Precautions: Fall Precaution Comments: watch O2 Restrictions Weight Bearing Restrictions: No      Mobility  Bed Mobility Overal bed mobility: Needs Assistance Bed Mobility: Supine to Sit     Supine to sit: Min assist;HOB elevated        Transfers Overall transfer level: Needs assistance Equipment used: Rolling walker (2 wheeled) Transfers: Sit to/from Stand Sit to Stand: Min assist         General transfer comment:  heavily pulls on walker  Ambulation/Gait Ambulation/Gait assistance: Min guard Gait Distance (Feet): 2 Feet Assistive device: Rolling walker (2 wheeled)       General Gait Details: Pt exhibited some unsteadiness while transferring to chair. CGA throughout for safety. Pt exhibited some fatigue and SOB after transfer.  Stairs            Wheelchair Mobility    Modified Rankin (Stroke Patients Only)       Balance Overall balance assessment: Needs assistance Sitting-balance support: Feet supported;Bilateral upper extremity supported Sitting balance-Leahy Scale: Poor     Standing balance support: During functional activity Standing balance-Leahy Scale: Poor                               Pertinent Vitals/Pain Pain Assessment: No/denies pain    Home Living                   Additional Comments: Pt questionable historian, chart review stated he is from [place]        Extremity/Trunk Assessment   Upper Extremity Assessment Upper Extremity Assessment: Generalized weakness    Lower Extremity Assessment Lower Extremity Assessment: Generalized weakness(grossly 4-/5 bilaterally, L hip flexion 3+/5)    Cervical / Trunk Assessment Cervical / Trunk Assessment: Kyphotic  Communication      Cognition Arousal/Alertness: Awake/alert Behavior During Therapy: WFL for tasks assessed/performed Overall Cognitive Status:  No family/caregiver present to determine baseline cognitive functioning                                        General Comments      Exercises     Assessment/Plan    PT Assessment Patient needs continued PT services  PT Problem List Decreased strength;Decreased mobility;Decreased range of motion;Decreased activity tolerance;Decreased balance;Decreased knowledge of use of DME;Decreased safety awareness       PT Treatment Interventions DME  instruction;Therapeutic exercise;Gait training;Balance training;Neuromuscular re-education;Functional mobility training;Therapeutic activities;Patient/family education    PT Goals (Current goals can be found in the Care Plan section)  Acute Rehab PT Goals Patient Stated Goal: to get warmed up PT Goal Formulation: With patient Time For Goal Achievement: 11/06/19 Potential to Achieve Goals: Fair    Frequency Min 2X/week   Barriers to discharge        Co-evaluation               AM-PAC PT "6 Clicks" Mobility  Outcome Measure Help needed turning from your back to your side while in a flat bed without using bedrails?: A Lot Help needed moving from lying on your back to sitting on the side of a flat bed without using bedrails?: A Lot Help needed moving to and from a bed to a chair (including a wheelchair)?: A Little Help needed standing up from a chair using your arms (e.g., wheelchair or bedside chair)?: A Little Help needed to walk in hospital room?: A Lot   6 Click Score: 12    End of Session Equipment Utilized During Treatment: Gait belt;Oxygen(2-4L) Activity Tolerance: Patient limited by fatigue Patient left: in chair;with chair alarm set;with call bell/phone within reach Nurse Communication: Mobility status PT Visit Diagnosis: Other abnormalities of gait and mobility (R26.89);Muscle weakness (generalized) (M62.81);Difficulty in walking, not elsewhere classified (R26.2)    Time: 1040-1110 PT Time Calculation (min) (ACUTE ONLY): 30 min   Charges:   PT Evaluation $PT Eval Moderate Complexity: 1 Mod PT Treatments $Therapeutic Activity: 8-22 mins       Lieutenant Diego PT, DPT 11:38 AM,10/23/19

## 2019-10-23 NOTE — TOC Initial Note (Signed)
Transition of Care [facility]) - Initial/Assessment Note    Patient Details  Name: Chris Simon MRN: [id number] Date of Birth: Feb 09, 1934  [facility] CM/SW Contact:    Eileen Stanford, LCSW Phone Number: 10/23/2019, 3:40 PM  Clinical Narrative:    Pt is disoriented x4. CSW spoke with pt's daughter via telephone. Pt's daughter confirmed pt came from [facility]. Pt was there as short term resident. Pt's daughter confirmed plan is for pt to return to [facility] at d/c. CSW will follow up with facility.               Expected Discharge Plan: Skilled Nursing Facility Barriers to Discharge: Continued Medical Work up   Patient Goals and CMS Choice Patient states their goals for this hospitalization and ongoing recovery are:: for pt to return to snf- per daughter   Choice offered to / list presented to : Adult Children  Expected Discharge Plan and Services Expected Discharge Plan: [place] In-house Referral: NA   Post Acute Care Choice: [place] Living arrangements for the past 2 months: [place]                                      Prior Living Arrangements/Services Living arrangements for the past 2 months: [place] Lives with:: Self Patient language and need for interpreter reviewed:: Yes Do you feel safe going back to the place where you live?: Yes      Need for Family Participation in Patient Care: Yes (Comment) Care giver support system in place?: Yes (comment)   Criminal Activity/Legal Involvement Pertinent to Current Situation/Hospitalization: No - Comment as needed  Activities of Daily Living      Permission Sought/Granted Permission sought to share information with : Family Supports    Share Information with NAME: [name]  Permission granted to share info w AGENCY: Dietitian granted to share info w Relationship: daughter     Emotional  Assessment Appearance:: Appears stated age Attitude/Demeanor/Rapport: Unable to Assess Affect (typically observed): Unable to Assess Orientation: : (disoriented x4) Alcohol / Substance Use: Not Applicable Psych Involvement: No (comment)  Admission diagnosis:  Acute respiratory failure with hypoxia ([place]) [J96.01] Acute hypoxemic respiratory failure ([place]) [J96.01] Patient Active Problem List   Diagnosis Date Noted  . Acute hypoxemic respiratory failure ([place]) 10/22/2019  . Chronic indwelling Foley catheter   . Alzheimer's dementia without behavioral disturbance ([place])   . Pneumonia 02/20/2017  . TIA (transient ischemic attack) 02/18/2017  . Left inguinal hernia 06/22/2016  . Umbilical hernia without obstruction and without gangrene 06/22/2016  . Calculus of kidney 09/03/2015  . Testicular cyst 09/03/2015  . Sepsis secondary to UTI ([place]) 07/20/2015  . BPH (benign prostatic hyperplasia) 07/18/2015  . Elevated PSA 07/18/2015  . Sepsis ([place]) 07/09/2015  . Acute kidney injury ([place])   . Paroxysmal A-fib ([place])   . UTI (lower urinary tract infection)   . Atrial fibrillation ([place]) 10/01/2014  . Pure hypercholesterolemia 10/01/2014  . Benign fibroma of prostate 10/06/2012  . Bladder retention 10/06/2012   PCP:  [name], MD Pharmacy:   [facility] [place], [address] AT [place] [address] Phone: [phone] Fax: [phone]     Social Determinants of Health (SDOH) Interventions    Readmission Risk  Interventions No flowsheet data found.

## 2019-10-24 LAB — BASIC METABOLIC PANEL
Anion gap: 3 — ABNORMAL LOW (ref 5–15)
BUN: 19 mg/dL (ref 8–23)
CO2: 30 mmol/L (ref 22–32)
Calcium: 7.4 mg/dL — ABNORMAL LOW (ref 8.9–10.3)
Chloride: 111 mmol/L (ref 98–111)
Creatinine, Ser: 1.12 mg/dL (ref 0.61–1.24)
GFR calc Af Amer: >60 mL/min (ref >60)
GFR calc non Af Amer: >60 mL/min (ref >60)
Glucose, Bld: 98 mg/dL (ref 70–99)
Potassium: 3.4 mmol/L — ABNORMAL LOW (ref 3.5–5.1)
Sodium: 144 mmol/L (ref 135–145)

## 2019-10-24 LAB — CBC
HCT: 28 % — ABNORMAL LOW (ref 39.0–52.0)
Hemoglobin: 8.6 g/dL — ABNORMAL LOW (ref 13.0–17.0)
MCH: 30.1 pg (ref 26.0–34.0)
MCHC: 30.7 g/dL (ref 30.0–36.0)
MCV: 97.9 fL (ref 80.0–100.0)
Platelets: 354 10*3/uL (ref 150–400)
RBC: 2.86 MIL/uL — ABNORMAL LOW (ref 4.22–5.81)
RDW: 14.6 % (ref 11.5–15.5)
WBC: 9.1 10*3/uL (ref 4.0–10.5)
nRBC: 0 % (ref 0.0–0.2)

## 2019-10-24 LAB — GLUCOSE, CAPILLARY: Glucose-Capillary: 115 mg/dL — ABNORMAL HIGH (ref 70–99)

## 2019-10-24 LAB — PROCALCITONIN: Procalcitonin: <0.1 ng/mL

## 2019-10-24 MED ORDER — ENSURE ENLIVE PO LIQD
237.0000 mL | Freq: Two times a day (BID) | ORAL | Status: DC
  Administered 2019-10-24 – 2019-10-25 (×2): 237 mL via ORAL

## 2019-10-24 MED ORDER — CEFDINIR 300 MG PO CAPS
300.0000 mg | ORAL_CAPSULE | Freq: Two times a day (BID) | ORAL | Status: DC
  Administered 2019-10-24 – 2019-10-25 (×3): 300 mg via ORAL
  Filled 2019-10-24 (×5): qty 1

## 2019-10-24 MED ORDER — DIPHENHYDRAMINE HCL 50 MG/ML IJ SOLN
12.5000 mg | Freq: Once | INTRAMUSCULAR | Status: AC
  Administered 2019-10-24: 22:00:00 12.5 mg via INTRAVENOUS
  Filled 2019-10-24: qty 1

## 2019-10-24 NOTE — Progress Notes (Signed)
[facility] [place] NAME: Chris Simon    MR#:  [id number]  DATE OF BIRTH:  [birth year]  SUBJECTIVE:  patient awake and alert however confused at baseline due to dementia. No respiratory distress. No fever.  REVIEW OF SYSTEMS:   Review of Systems  Unable to perform ROS: Dementia   Tolerating Diet: Tolerating PT: rehab  DRUG ALLERGIES:   Allergies  Allergen Reactions  . Clindamycin Hcl Rash    Unknown reaction  Reaction: Unknown  Unknown reaction   . Sulfamethoxazole-Trimethoprim Rash    VITALS:  Blood pressure (!) 107/58, pulse (!) 58, temperature 97.8 F (36.6 C), temperature source Oral, resp. rate 16, height 6' (1.829 m), weight 75.1 kg, SpO2 96 %.  PHYSICAL EXAMINATION:   Physical Exam  GENERAL:  86 y.o.-year-old patient lying in the bed with no acute distress.  EYES: Pupils equal, round, reactive to light and accommodation. No scleral icterus.   HEENT: Head atraumatic, normocephalic. Oropharynx and nasopharynx clear.  NECK:  Supple, no jugular venous distention. No thyroid enlargement, no tenderness.  LUNGS: Normal breath sounds bilaterally, no wheezing, rales, rhonchi. No use of accessory muscles of respiration.  CARDIOVASCULAR: S1, S2 normal. No murmurs, rubs, or gallops.  ABDOMEN: Soft, nontender, nondistended. Bowel sounds present. No organomegaly or mass.  EXTREMITIES: No cyanosis, clubbing or edema b/l.    NEUROLOGIC: grossly nonfocal. Moves all extremities well. PSYCHIATRIC:  patient is alert and at baseline has dementia SKIN: No obvious rash, lesion, or ulcer.   LABORATORY PANEL:  CBC Recent Labs  Lab 10/24/19 0738  WBC 9.1  HGB 8.6*  HCT 28.0*  PLT 354    Chemistries  Recent Labs  Lab 10/22/19 0903 10/22/19 0903 10/24/19 0738  NA 145   < > 144  K 3.9   < > 3.4*  CL 111   < > 111  CO2 28   < > 30  GLUCOSE 97   < > 98  BUN 23   < > 19  CREATININE 1.33*   < > 1.12  CALCIUM 7.5*   < > 7.4*   AST 20  --   --   ALT 19  --   --   ALKPHOS 89  --   --   BILITOT 0.8  --   --    < > = values in this interval not displayed.   Cardiac Enzymes No results for input(s): TROPONINI in the last 168 hours. RADIOLOGY:  No results found. ASSESSMENT AND PLAN:  Chris Simon a85 y.o.malewith a known history of dementia, BPH with chronic Foley catheter, recent ESBL E. coli UTI in December 2020, COVID-19 infection in December 2020, chronic a fib on eliquis comes to the emergency room from [facility] after he pulled his Foley catheter out.  1.Acute hypoxic respiratory failure secondary to post COVID , cxr with multifocal pna, which is post COVID interstitial lung changes  -change to oral antibiotic -F/u blood cultures negative -sats more than 90% on 2 L -Pro calcitonin 0.10 -PRN nebulizer/inhaler -patient may require oxygen to go with at [facility]   2.Recent ESBL E. coli UTI with history of urinary retention secondary to BPH requiring chronic Foley catheter [facility] at [facility] in December 2020 -urine positive forWBC -given chronic Foley he will have some colonization and holding of further antibiotics for UTI -just finished the course [facility] -consider infectious disease consultation if needed -Foley care per protocol  3.Paroxysmal  atrial fibrillation -continue amiodarone, Coreg -continue eliquis  4.History of dementia continue Namenda and Aricept  5.Hyperlipidemia on Crestor  6.BPH -continue Flomax and Proscar  7.Generalized weakness -physical therapy-rec. snf  Procedures: none Family communication :dter Chris Simon Consults :none Discharge Disposition :to SNF CODE STATUS: FULL DVT Prophylaxis :eliquis Barriers to discharge: per TOC--pt's insurance requires PT to see again and OT also Ordered OT and have requested PT see pt today  TOTAL TIME TAKING CARE OF THIS PATIENT: *25* minutes.  >50% time spent on  counselling and coordination of care  Note: This dictation was prepared with Dragon dictation along with smaller phrase technology. Any transcriptional errors that result from this process are unintentional.  Chris Simon M.D    [facility]   CC: Primary care physician; Chris Simon, MDPatient ID: Chris Simon, male   DOB: 1933/11/09, 86 y.o.   MRN: [id number]

## 2019-10-24 NOTE — Progress Notes (Signed)
Initial Nutrition Assessment  DOCUMENTATION CODES:   Not applicable  INTERVENTION:  Ensure Enlive po BID, each supplement provides 350 kcal and 20 grams of protein  Magic cup TID with meals, each supplement provides 290 kcal and 9 grams of protein  Recommend feeding assistance at meal times to encourage po intake secondary to dementia  Continue MVI daily   NUTRITION DIAGNOSIS:   Inadequate oral intake related to acute illness, lethargy/confusion as evidenced by meal completion < 25%, percent weight loss(acute respiratory failure secondary to post COVID scarring/fibrosis, severe dementia).   GOAL:   Patient will meet greater than or equal to 90% of their needs    MONITOR:   PO intake, Supplement acceptance, Labs, Weight trends, I & O's, Skin  REASON FOR ASSESSMENT:   Malnutrition Screening Tool    ASSESSMENT:  RD working remotely.  86 year old male with past medical history of dementia, BPH with chronic Foley catheter, recent ESBL E. Coli UTI and COVID-19 infection in December, chronic a fib, COPD, AAA, h/o TIA (2018), who presented from facility after removing his chronic Foley catheter. In ED, Foley replaced and pt noted to by hypoxic with sats 86-87% without any respiratory distress and admitted for acute hypoxic respiratory failure secondary to post COVID scarring/fibrosis noted on CXR.  Per notes, pt disoriented x 4. Patient presented from [facility] as a short term resident after diagnoisis of Covid-19 infection on 09/14/19. Plans for pt to return at d/c.   Patient eating 20% x 1 documented meal this admission.  Given pt history of dementia, he may benefit from presence of nursing staff during meal times to encourage po intake.   Suspect patient likely to meet criteria for severe malnutrition in the context of chronic disease given the severe wt losses noted below and patient likely to have had persistent inadequate intake secondary to recent Covid-19 infection.  RD working remotely today, unable to identify at this time. Nutrition department will plan to complete NFPE at follow up to assess for fat/muscle depletions. At this time, will provide Ensure and Magic Cup nutrition supplements to aid with calorie and protein needs.  Current wt 165.22 lbs Weight history reviewed, noted 25.74 lb (13.5%) loss in the past 3.5 months which is severe for time frame. On 7/13 pt wt 86.6 kg (190.52 lbs), 9/08 pt wt 89.4 kg (196.68 lbs), 10/08 pt wt 86.8 kg (190.96 lbs)  Medications reviewed Labs: BG 98, K 3.4 (L), Hgb 8.6 (L)  NUTRITION - FOCUSED PHYSICAL EXAM: Unable to complete at this time, RD working remotely.   Diet Order:   Diet Order            Diet 2 gram sodium Room service appropriate? Yes; Fluid consistency: Thin  Diet effective now              EDUCATION NEEDS:   Not appropriate for education at this time  Skin:  Skin Assessment: Reviewed RN Assessment(MASD; buttocks)  Last BM:  PTA  Height:   Ht Readings from Last 1 Encounters:  10/23/19 6' (1.829 m)    Weight:   Wt Readings from Last 1 Encounters:  10/23/19 75.1 kg    Ideal Body Weight:  80.9 kg  BMI:  Body mass index is 22.45 kg/m.  Estimated Nutritional Needs:   Kcal:  1900-2100  Protein:  100-110  Fluid:  >/= 1.8 L/day   Lajuan Lines, RD, LDN Clinical Nutrition Office Telephone [phone] After Hours/Weekend Pager: [phone]

## 2019-10-24 NOTE — TOC Progression Note (Addendum)
Transition of Care [facility]) - Progression Note    Patient Details  Name: Chris Simon MRN: [id number] Date of Birth: 11/10/1933  Transition of [facility] CM/SW Contact  Beverly Sessions, RN Phone Number: 10/24/2019, 2:33 PM  Clinical Narrative:    Updated therapy notes sent to [facility] in the [place]  Per plan to discharge to SNF tomorrow.  Magda Paganini notified    Expected Discharge Plan: Skilled Nursing Facility Barriers to Discharge: Continued Medical Work up  Expected Discharge Plan and Services Expected Discharge Plan: [place] In-house Referral: NA   Post Acute Care Choice: [place] Living arrangements for the past 2 months: [place]                                       Social Determinants of Health (SDOH) Interventions    Readmission Risk Interventions No flowsheet data found.

## 2019-10-24 NOTE — Evaluation (Signed)
Occupational Therapy Evaluation Patient Details Name: Chris Simon MRN: [id number] DOB: 01-Dec-1933 Today's Date: 10/24/2019    History of Present Illness Pt is 86 y.o. male with a known history of dementia, AAA,afib,COPD,HLD, TIA with chronic Foley catheter, recent ESBL E. coli UTI in December 2020, COVID-19 infection in December 2020, chronic a fib on eliquis comes to the emergency room from [facility] after he pulled his Foley catheter out. pt is being admitted for acute hypoxic respiratory failure secondary to post COVID scarring/fibrosis noted on chest x-ray.   Clinical Impression   Pt was seen for OT evaluation this date. Prior to hospital admission, pt's functional status is unclear as he is confused and poor historian, however, from chart review, it is noted that pt presented from [facility].  Currently pt demonstrates impairments as described below (See OT problem list) which functionally limit his ability to perform ADL/self-care tasks. Pt currently requires MOD A for bed mobility, MIN/MOD A for sit<>stand with RW, MIN A to maintain static standing balance, setup and SBA to safely complete seated UB ADLs and MAX A for LB ADLs.  Pt would benefit from skilled OT to address noted impairments and functional limitations (see below for any additional details) in order to maximize safety and independence while minimizing falls risk and caregiver burden. Upon hospital discharge, recommend pt return to SNF for some STR to improve fxl activity tolerance and balance as it pertains to the safe completion of self care ADLs.     Follow Up Recommendations  SNF    Equipment Recommendations  Other (comment)(defer to next level of care)    Recommendations for Other Services       Precautions / Restrictions Precautions Precautions: Fall Precaution Comments: watch O2 Restrictions Weight Bearing Restrictions: No      Mobility Bed Mobility Overal bed mobility: Needs  Assistance Bed Mobility: Supine to Sit;Sit to Supine     Supine to sit: Mod assist;HOB elevated Sit to supine: Mod assist      Transfers Overall transfer level: Needs assistance Equipment used: Rolling walker (2 wheeled) Transfers: Sit to/from Stand Sit to Stand: Min assist;Mod assist;From elevated surface         General transfer comment: MOD tactile/verbal cues for safe hand placement relative to RW with sit<>stand    Balance Overall balance assessment: Needs assistance Sitting-balance support: Feet supported;Single extremity supported Sitting balance-Leahy Scale: Poor Sitting balance - Comments: requires at least 1 UE for seated support, intermittent MIN A to correct seated balance, assist with righting.   Standing balance support: Bilateral upper extremity supported Standing balance-Leahy Scale: Poor Standing balance comment: requires MIN/MOD A with heavy UE support through RW.                           ADL either performed or assessed with clinical judgement   ADL                                         General ADL Comments: Pt requires setup and SBA for seated UB ADLs (alternates hands and uses at least one for seated support), MAX A for seated LB ADLs, MIN/MOD A for ADL transfers with RW.     Vision   Additional Comments: difficult to assess 2/2 confusion. Pt appears to mostly track appropriately once eyes open, difficult to get visual attention initially  d/t fatigue/lethargy     Perception     Praxis      Pertinent Vitals/Pain Pain Assessment: Faces Faces Pain Scale: Hurts little more Pain Location: Pt initially states he is in no pain, but grunts and grimaces with mobilization. Pt is unable to quantify and says pain is "all over" Pain Descriptors / Indicators: Grimacing;Moaning Pain Intervention(s): Limited activity within patient's tolerance;Monitored during session     Hand Dominance     Extremity/Trunk Assessment Upper  Extremity Assessment Upper Extremity Assessment: Generalized weakness(can move through full arc of motion, but some shakiness of UEs noted when sitting EOB using B UEs for seated support.)   Lower Extremity Assessment Lower Extremity Assessment: Generalized weakness   Cervical / Trunk Assessment Cervical / Trunk Assessment: Kyphotic   Communication     Cognition Arousal/Alertness: Awake/alert Behavior During Therapy: WFL for tasks assessed/performed Overall Cognitive Status: No family/caregiver present to determine baseline cognitive functioning                                 General Comments: Pt confused, oriented to self only. Some garbled speech, becomes somewhat clearer in EOB sitting. Follows most one step commands with MIN/MOD verbal/tactile cues.   General Comments       Exercises     Shoulder Instructions      Home Living Family/patient expects to be discharged to:: Skilled nursing facility                                 Additional Comments: Pt questionable historian, unable to provide reliable PLOF or home information. Chart review reveals pt presented from [facility]      Prior Functioning/Environment                   OT Problem List: Decreased strength;Decreased activity tolerance;Impaired balance (sitting and/or standing);Decreased cognition;Decreased safety awareness;Decreased knowledge of use of DME or AE      OT Treatment/Interventions: Self-care/ADL training;Therapeutic exercise;Therapeutic activities;Patient/family education;Balance training    OT Goals(Current goals can be found in the care plan section) Acute Rehab OT Goals OT Goal Formulation: Patient unable to participate in goal setting  OT Frequency: Min 1X/week   Barriers to D/C:            Co-evaluation              AM-PAC OT "6 Clicks" Daily Activity     Outcome Measure Help from another person eating meals?: None Help from another  person taking care of personal grooming?: A Little Help from another person toileting, which includes using toliet, bedpan, or urinal?: A Lot Help from another person bathing (including washing, rinsing, drying)?: A Lot Help from another person to put on and taking off regular upper body clothing?: A Little Help from another person to put on and taking off regular lower body clothing?: A Lot 6 Click Score: 16   End of Session Equipment Utilized During Treatment: Gait belt;Rolling walker  Activity Tolerance: Patient tolerated treatment well Patient left: in bed;with call bell/phone within reach;with bed alarm set  OT Visit Diagnosis: Unsteadiness on feet (R26.81);Muscle weakness (generalized) (M62.81)                Time: [id number] OT Time Calculation (min): 27 min Charges:  OT General Charges $OT Visit: 1 Visit OT Evaluation $OT Eval Moderate Complexity: 1 Mod  OT Treatments $Therapeutic Activity: 8-22 mins  Gerrianne Scale, MS, OTR/L ascom [phone] 10/24/19, 11:51 AM

## 2019-10-25 LAB — GLUCOSE, CAPILLARY
Glucose-Capillary: 98 mg/dL (ref 70–99)
Glucose-Capillary: 115 mg/dL — ABNORMAL HIGH (ref 70–99)

## 2019-10-25 MED ORDER — CEFDINIR 300 MG PO CAPS: 300.0000 mg | ORAL_CAPSULE | Freq: Two times a day (BID) | ORAL | 0 refills | Status: AC

## 2019-10-25 MED ORDER — ALBUTEROL SULFATE HFA 108 (90 BASE) MCG/ACT IN AERS
1.0000 | INHALATION_SPRAY | RESPIRATORY_TRACT | Status: DC | PRN
  Filled 2019-10-25: qty 6.7

## 2019-10-25 MED ORDER — FLEET ENEMA 7-19 GM/118ML RE ENEM
1.0000 | ENEMA | Freq: Once | RECTAL | Status: AC
  Administered 2019-10-25: 1 via RECTAL

## 2019-10-25 MED ORDER — ENSURE ENLIVE PO LIQD: 237.0000 mL | Freq: Two times a day (BID) | ORAL | 12 refills | Status: DC

## 2019-10-25 MED ORDER — DIPHENHYDRAMINE HCL 25 MG PO CAPS
25.0000 mg | ORAL_CAPSULE | Freq: Once | ORAL | Status: AC
  Administered 2019-10-25: 08:00:00 25 mg via ORAL
  Filled 2019-10-25: qty 1

## 2019-10-25 MED ORDER — ALBUTEROL SULFATE HFA 108 (90 BASE) MCG/ACT IN AERS: 1.0000 | INHALATION_SPRAY | RESPIRATORY_TRACT | 0 refills | Status: AC | PRN

## 2019-10-25 NOTE — Progress Notes (Signed)
Physical Therapy Treatment Patient Details Name: Chris Simon MRN: [id number] DOB: 1934-03-28 Today's Date: 10/25/2019    History of Present Illness Pt is 86 y.o. male with a known history of dementia, AAA,afib,COPD,HLD, TIA with chronic Foley catheter, recent ESBL E. coli UTI in December 2020, COVID-19 infection in December 2020, chronic a fib on eliquis comes to the emergency room from [facility] after he pulled his Foley catheter out. pt is being admitted for acute hypoxic respiratory failure secondary to post COVID scarring/fibrosis noted on chest x-ray.    PT Comments    Pt alert, oriented to self only. Compared to previous PT session patient a bit more restless, but easily redirectable and able to follow 1 step commands. Soft mitts doffed for PT session, donned at end of session. Placed on 4L while sitting EOB to maintain spO2 >90%. Pt with improved steadiness noted but still experienced a drop in O2 readings and exhibits SOB after ambulation. CGA with RW to ambulate to chair, minA for sit <> stand. Recovered from mid 80s on 4L, placed on 2L at end of session >90% at rest. Overall the patient would benefit from further skilled PT intervention to continue to address limitations in functional mobility, safety, endurance, and activity tolerance.     Follow Up Recommendations  SNF     Equipment Recommendations  Other (comment)(TBD)    Recommendations for Other Services       Precautions / Restrictions Precautions Precautions: Fall Precaution Comments: watch O2 Restrictions Weight Bearing Restrictions: No    Mobility  Bed Mobility Overal bed mobility: Needs Assistance Bed Mobility: Supine to Sit     Supine to sit: Mod assist     General bed mobility comments: sequencing cueing, tactile cues  Transfers Overall transfer level: Needs assistance Equipment used: Rolling walker (2 wheeled) Transfers: Sit to/from Stand Sit to Stand: Min assist         General  transfer comment: heavily reliance on RW  Ambulation/Gait Ambulation/Gait assistance: Min guard Gait Distance (Feet): 3 Feet Assistive device: Rolling walker (2 wheeled)       General Gait Details: Pt improved steadiness noted this session but still desaturations and exhibits SOB after ambulation. recovered from mid 80s on 4L, placed on 2L at end of session   Stairs             Wheelchair Mobility    Modified Rankin (Stroke Patients Only)       Balance Overall balance assessment: Needs assistance Sitting-balance support: Feet supported;Single extremity supported Sitting balance-Leahy Scale: Fair     Standing balance support: Bilateral upper extremity supported Standing balance-Leahy Scale: Poor                              Cognition Arousal/Alertness: Awake/alert Behavior During Therapy: WFL for tasks assessed/performed Overall Cognitive Status: No family/caregiver present to determine baseline cognitive functioning                                 General Comments: garbled speech noted, able to follow commands. a bit more restless today, easily redirected      Exercises Other Exercises Other Exercises: Patient needed to be redirected several times, perseverating on need to urinate despite eduction on foley catheter.    General Comments        Pertinent Vitals/Pain Pain Assessment: Faces Faces Pain Scale: Hurts a little  bit Pain Location: grunts/grimaces with mobilization, denies pain initially Pain Descriptors / Indicators: Grimacing;Moaning Pain Intervention(s): Limited activity within patient's tolerance;Repositioned;Monitored during session    Home Living                      Prior Function            PT Goals (current goals can now be found in the care plan section) Progress towards PT goals: Progressing toward goals    Frequency    Min 2X/week      PT Plan Current plan remains appropriate     Co-evaluation              AM-PAC PT "6 Clicks" Mobility   Outcome Measure  Help needed turning from your back to your side while in a flat bed without using bedrails?: A Lot Help needed moving from lying on your back to sitting on the side of a flat bed without using bedrails?: A Lot Help needed moving to and from a bed to a chair (including a wheelchair)?: A Little Help needed standing up from a chair using your arms (e.g., wheelchair or bedside chair)?: A Little Help needed to walk in hospital room?: A Lot Help needed climbing 3-5 steps with a railing? : Total 6 Click Score: 13    End of Session Equipment Utilized During Treatment: Gait belt;Oxygen(2-4L) Activity Tolerance: Patient limited by fatigue Patient left: in chair;with chair alarm set;with call bell/phone within reach Nurse Communication: Mobility status PT Visit Diagnosis: Other abnormalities of gait and mobility (R26.89);Muscle weakness (generalized) (M62.81);Difficulty in walking, not elsewhere classified (R26.2)     Time: [id number] PT Time Calculation (min) (ACUTE ONLY): 18 min  Charges:  $Therapeutic Exercise: 8-22 mins                     Lieutenant Diego PT, DPT 10:47 AM,10/25/19

## 2019-10-25 NOTE — Discharge Instructions (Signed)
Use oxygen to keep sats greater than 90% continue nebulizer as needed for shortness of breath

## 2019-10-25 NOTE — Progress Notes (Signed)
EMS called for transport. Report called to [facility]. Daughter notified.

## 2019-10-25 NOTE — Discharge Summary (Addendum)
[facility] NAME: Chris Simon    MR#:  [id number]  DATE OF BIRTH:  1934-09-01  DATE OF ADMISSION:  10/22/2019 ADMITTING PHYSICIAN: Fritzi Mandes, MD  DATE OF DISCHARGE: 10/24/2018  PRIMARY CARE PHYSICIAN: [name], MD    ADMISSION DIAGNOSIS:  Acute respiratory failure with hypoxia ([place]) [J96.01] Acute hypoxemic respiratory failure ([place]) [J96.01]  DISCHARGE DIAGNOSIS:  acute hypoxic respiratory failure secondary to post COVID changes of interstitial lung infiltrates  SECONDARY DIAGNOSIS:   Past Medical History:  Diagnosis Date  . Aneurysm ([place])    Abdominal Aortic Aneurysm  . Atrial fibrillation ([place])   . Blockage of a bile duct   . BPH with obstruction/lower urinary tract symptoms   . Cellulitis of scrotum   . COPD (chronic obstructive pulmonary disease) ([place])   . Dementia ([place])   . Diverticulosis   . Dyspnea    with exertion  . Dysrhythmia   . Elevated PSA   . Epididymitis   . Gallstone   . Gross hematuria   . History of kidney stones   . Hyperlipidemia   . Kidney stones   . Nocturia   . Testicular pain   . TIA (transient ischemic attack)    June 2018  . Urinary retention     HOSPITAL COURSE:   [name] a85 y.o.malewith a known history of dementia, BPH with chronic Foley catheter, recent ESBL E. coli UTI in December 2020, COVID-19 infection in December 2020, chronic a fib on eliquis comes to the emergency room from [facility] after he pulled his Foley catheter out.  1.Acute hypoxic respiratory failure secondary to post COVID, cxr with multifocal pna, which is post COVID interstitial lung changes  -change to oral antibiotic (total 5 days) -F/u blood cultures negative -sats more than 90% on 2 L -Pro calcitonin 0.10 -PRN nebulizer/inhaler -patient will require oxygen to go with at [facility]  2.Recent ESBL E. coli UTI with history of urinary retention secondary to BPH requiring  chronic Foley catheter placement at [facility] in December 2020 -urine positive forWBC -given chronic Foley he will have some colonization and holding of further antibioticsfor UTI -just finished the course ofIV ertapenem from dec 20-26 th [facility] -Foley care per protocol -pt [facility] with [facility]  3.Paroxysmal atrial fibrillation -continue amiodarone, Coreg -continue eliquis  4.History of Advanced dementia with intermittent confusion -on  Namenda and Aricept  5.Hyperlipidemia on Crestor  6.BPH with LUTS and h/o hematuria -continue Flomax and Proscar  7.Generalized weakness -physical therapy-rec. snf  Procedures: none Family communication :dter Chris Simon Consults :none Discharge Disposition :to SNF [facility]) CODE STATUS: FULL DVT Prophylaxis :eliquis Barriers to discharge: per TOC--pt's insurance requires PT to see again OT has seen pt. Discharge today after seen by PT CONSULTS OBTAINED:    DRUG ALLERGIES:   Allergies  Allergen Reactions  . Clindamycin Hcl Rash    Unknown reaction  Reaction: Unknown  Unknown reaction   . Sulfamethoxazole-Trimethoprim Rash    DISCHARGE MEDICATIONS:   Allergies as of 10/25/2019      Reactions   Clindamycin Hcl Rash   Unknown reaction  Reaction: Unknown  Unknown reaction    Sulfamethoxazole-trimethoprim Rash      Medication List    STOP taking these medications   ipratropium-albuterol 0.5-2.5 (3) MG/3ML Soln Commonly known as: DUONEB     TAKE these medications   albuterol 108 (90 Base) MCG/ACT inhaler Commonly known as: VENTOLIN HFA Inhale 1-2 puffs  into the lungs every 4 (four) hours as needed for wheezing or shortness of breath.   amiodarone 200 MG tablet Commonly known as: PACERONE Take 200 mg by mouth at bedtime.   apixaban 5 MG Tabs tablet Commonly known as: ELIQUIS Take 1 tablet (5 mg total) by mouth 2 (two) times daily.   B-12 2500 MCG Tabs Take 2,500 mg by mouth  daily.   carvedilol 6.25 MG tablet Commonly known as: COREG Take 6.25 mg by mouth 2 (two) times daily.   cefdinir 300 MG capsule Commonly known as: OMNICEF Take 1 capsule (300 mg total) by mouth every 12 (twelve) hours for 2 days.   donepezil 10 MG tablet Commonly known as: ARICEPT Take 10 mg by mouth at bedtime.   enalapril 2.5 MG tablet Commonly known as: VASOTEC Take 2.5 mg by mouth at bedtime.   feeding supplement (ENSURE ENLIVE) Liqd Take 237 mLs by mouth 2 (two) times daily between meals.   finasteride 5 MG tablet Commonly known as: PROSCAR Take 1 tablet (5 mg total) by mouth daily.   furosemide 20 MG tablet Commonly known as: LASIX Take 20 mg by mouth daily as needed for edema.   isosorbide mononitrate 30 MG 24 hr tablet Commonly known as: IMDUR Take 30 mg by mouth at bedtime.   loratadine 10 MG tablet Commonly known as: CLARITIN Take 10 mg by mouth daily as needed for allergies.   memantine 10 MG tablet Commonly known as: NAMENDA Take 10 mg by mouth 2 (two) times daily.   mirtazapine 15 MG tablet Commonly known as: REMERON Take 15 mg by mouth at bedtime.   multivitamin with minerals Tabs tablet Take 1 tablet by mouth daily.   rosuvastatin 40 MG tablet Commonly known as: CRESTOR Take 40 mg by mouth at bedtime.   tamsulosin 0.4 MG Caps capsule Commonly known as: FLOMAX Take 1 capsule (0.4 mg total) by mouth daily.       If you experience worsening of your admission symptoms, develop shortness of breath, life threatening emergency, suicidal or homicidal thoughts you must seek medical attention immediately by calling 911 or calling your MD immediately  if symptoms less severe.  You Must read complete instructions/literature along with all the possible adverse reactions/side effects for all the Medicines you take and that have been prescribed to you. Take any new Medicines after you have completely understood and accept all the possible adverse  reactions/side effects.   Please note  You were cared for by a hospitalist during your hospital stay. If you have any questions about your discharge medications or the care you received while you were in the hospital after you are discharged, you can call the unit and asked to speak with the hospitalist on call if the hospitalist that took care of you is not available. Once you are discharged, your primary care physician will handle any further medical issues. Please note that NO REFILLS for any discharge medications will be authorized once you are discharged, as it is imperative that you return to your primary care physician (or establish a relationship with a primary care physician if you do not have one) for your aftercare needs so that they can reassess your need for medications and monitor your lab values. Today   SUBJECTIVE   Confused at baseline  VITAL SIGNS:  Blood pressure 116/60, pulse 66, temperature 97.6 F (36.4 C), temperature source Oral, resp. rate 20, height 6' (1.829 m), weight 75.1 kg, SpO2 91 %.  I/O:  Intake/Output Summary (Last 24 hours) at 10/25/2019 1336 Last data filed at 10/25/2019 0146 Gross per 24 hour  Intake 440 ml  Output --  Net 440 ml    PHYSICAL EXAMINATION:  GENERAL:  86 y.o.-year-old patient lying in the bed with no acute distress.  EYES: Pupils equal, round, reactive to light and accommodation. HEENT: Head atraumatic, normocephalic. Oropharynx and nasopharynx clear.  NECK:  Supple, no jugular venous distention. No thyroid enlargement, no tenderness.  LUNGS: Normal breath sounds bilaterally, no wheezing, rales,rhonchi or crepitation. No use of accessory muscles of respiration.  CARDIOVASCULAR: S1, S2 normal. No murmurs, rubs, or gallops.  ABDOMEN: Soft, non-tender, non-distended. Bowel sounds present. No organomegaly or mass. Chronic Foley + EXTREMITIES: No pedal edema, cyanosis, or clubbing.  NEUROLOGIC: grossly nonfocal. Moves all extremities  well.  PSYCHIATRIC: The patient is alert and confused at baseline due to dementia SKIN: No obvious rash, lesion, or ulcer.    DATA REVIEW:   CBC  Recent Labs  Lab 10/24/19 0738  WBC 9.1  HGB 8.6*  HCT 28.0*  PLT 354    Chemistries  Recent Labs  Lab 10/22/19 0903 10/22/19 0903 10/24/19 0738  NA 145   < > 144  K 3.9   < > 3.4*  CL 111   < > 111  CO2 28   < > 30  GLUCOSE 97   < > 98  BUN 23   < > 19  CREATININE 1.33*   < > 1.12  CALCIUM 7.5*   < > 7.4*  AST 20  --   --   ALT 19  --   --   ALKPHOS 89  --   --   BILITOT 0.8  --   --    < > = values in this interval not displayed.    Microbiology Results   Recent Results (from the past 240 hour(s))  Blood culture (routine x 2)     Status: None (Preliminary result)   Collection Time: 10/22/19  9:04 AM   Specimen: BLOOD  Result Value Ref Range Status   Specimen Description BLOOD RIGHT ANTECUBITAL  Final   Special Requests   Final    BOTTLES DRAWN AEROBIC AND ANAEROBIC Blood Culture adequate volume   Culture   Final    NO GROWTH 3 DAYS Performed at [facility], [address], [place], [place] [address]    Report Status PENDING  Incomplete  Blood culture (routine x 2)     Status: None (Preliminary result)   Collection Time: 10/22/19 11:00 AM   Specimen: BLOOD  Result Value Ref Range Status   Specimen Description BLOOD RIGHT [facility]  Final   Special Requests   Final    BOTTLES DRAWN AEROBIC AND ANAEROBIC Blood Culture adequate volume   Culture   Final    NO GROWTH 3 DAYS Performed at [facility], [address] [place], [place] [address]    Report Status PENDING  Incomplete  MRSA PCR Screening     Status: None   Collection Time: 10/23/19  5:55 AM   Specimen: Nasal Mucosa; Nasopharyngeal  Result Value Ref Range Status   MRSA by PCR NEGATIVE NEGATIVE Final    Comment:        The GeneXpert MRSA Assay (FDA approved for NASAL specimens only), is one component of a comprehensive MRSA  colonization surveillance program. It is not intended to diagnose MRSA infection nor to guide or monitor treatment for MRSA infections. Performed at [facility], [address], [place], [place] [address]     RADIOLOGY:  No results found.   CODE STATUS:     Code Status Orders  (From admission, onward)         Start     Ordered   10/22/19 1400  Full code  Continuous     10/22/19 1359        Code Status History    Date Active Date Inactive Code Status Order ID Comments User Context   01/10/2018 0956 01/10/2018 1439 Full Code [id number]  Dionisio David, MD Inpatient   02/18/2017 2356 02/21/2017 1310 Full Code [id number]  Lance Coon, MD Inpatient   07/08/2015 2059 07/14/2015 1933 Full Code [id number]  Corey Harold, NP Inpatient   Advance Care Planning Activity       TOTAL TIME TAKING CARE OF THIS PATIENT: *40* minutes.    Fritzi Mandes M.D  [facility]    CC: Primary care physician; [name], MD

## 2019-10-25 NOTE — Progress Notes (Signed)
Patient ID: Chris Simon, male   DOB: 06-Jun-1934, 86 y.o.   MRN: [id number]  Rapid COVID ordered for discharge planning

## 2019-10-25 NOTE — Care Management Important Message (Signed)
Important Message  Patient Details  Name: Chris Simon MRN: [id number] Date of Birth: 07/21/1934   Medicare Important Message Given:  N/A - LOS <3 / Initial given by admissions     Dannette Barbara 10/25/2019, 2:06 PM

## 2019-10-25 NOTE — Progress Notes (Signed)
Pt discharged per MD order. Report called to [facility]. IV removed. Discharge paperwork sent with EMS.

## 2019-10-25 NOTE — TOC Transition Note (Signed)
Transition of Care [facility]) - CM/SW Discharge Note   Patient Details  Name: Chris Simon MRN: [id number] Date of Birth: 1934/01/18  [facility] CM/SW Contact:  Beverly Sessions, RN Phone Number: 10/25/2019, 2:42 PM   Clinical Narrative:    Patient to return to [facility] today.   DC info sent in the hub [place] [facility] on chart.  Bedside RN notified    Final next level of care: Skilled Nursing Facility Barriers to Discharge: No Barriers Identified   Patient Goals and CMS Choice Patient states their goals for this hospitalization and ongoing recovery are:: for pt to return to snf- per daughter   Choice offered to / list presented to : Adult Children  Discharge Placement              Patient chooses bed at: [facility] Patient to be transferred to facility by: EMS Name of family member notified: daughter Patient and family notified of of transfer: 10/25/19  Discharge Plan and Services In-house Referral: NA   Post Acute Care Choice: [place]                               Social Determinants of Health (SDOH) Interventions     Readmission Risk Interventions No flowsheet data found.

## 2019-10-27 LAB — CULTURE, BLOOD (ROUTINE X 2)
Culture: NO GROWTH
Culture: NO GROWTH
Special Requests: ADEQUATE
Special Requests: ADEQUATE

## 2019-10-29 ENCOUNTER — Ambulatory Visit: Payer: Medicare HMO | Admitting: Physician Assistant

## 2019-11-01 ENCOUNTER — Emergency Department: Payer: Medicare HMO

## 2019-11-01 ENCOUNTER — Encounter: Payer: Self-pay | Admitting: Physician Assistant

## 2019-11-01 ENCOUNTER — Ambulatory Visit: Payer: Medicare HMO | Admitting: Physician Assistant

## 2019-11-01 ENCOUNTER — Ambulatory Visit (INDEPENDENT_AMBULATORY_CARE_PROVIDER_SITE_OTHER): Payer: Medicare HMO | Admitting: Physician Assistant

## 2019-11-01 ENCOUNTER — Other Ambulatory Visit: Payer: Self-pay

## 2019-11-01 ENCOUNTER — Inpatient Hospital Stay
Admission: EM | Admit: 2019-11-01 | Discharge: 2019-11-05 | DRG: 314 | Disposition: A | Discharge status: Skilled Nursing Facility | Payer: Medicare HMO | Source: Ambulatory Visit | Attending: Internal Medicine | Admitting: Internal Medicine

## 2019-11-01 ENCOUNTER — Encounter: Payer: Self-pay | Admitting: Emergency Medicine

## 2019-11-01 DIAGNOSIS — R001 Bradycardia, unspecified: Secondary | ICD-10-CM | POA: Diagnosis present

## 2019-11-01 DIAGNOSIS — R310 Gross hematuria: Secondary | ICD-10-CM | POA: Diagnosis not present

## 2019-11-01 DIAGNOSIS — R6521 Severe sepsis with septic shock: Secondary | ICD-10-CM | POA: Diagnosis not present

## 2019-11-01 DIAGNOSIS — Y732 Prosthetic and other implants, materials and accessory gastroenterology and urology devices associated with adverse incidents: Secondary | ICD-10-CM | POA: Diagnosis not present

## 2019-11-01 DIAGNOSIS — J449 Chronic obstructive pulmonary disease, unspecified: Secondary | ICD-10-CM | POA: Diagnosis not present

## 2019-11-01 DIAGNOSIS — Z9181 History of falling: Secondary | ICD-10-CM

## 2019-11-01 DIAGNOSIS — R531 Weakness: Secondary | ICD-10-CM | POA: Diagnosis present

## 2019-11-01 DIAGNOSIS — Z8616 Personal history of COVID-19: Secondary | ICD-10-CM

## 2019-11-01 DIAGNOSIS — N401 Enlarged prostate with lower urinary tract symptoms: Secondary | ICD-10-CM | POA: Diagnosis present

## 2019-11-01 DIAGNOSIS — Z960 Presence of urogenital implants: Secondary | ICD-10-CM | POA: Diagnosis not present

## 2019-11-01 DIAGNOSIS — J939 Pneumothorax, unspecified: Secondary | ICD-10-CM | POA: Diagnosis present

## 2019-11-01 DIAGNOSIS — Z87440 Personal history of urinary (tract) infections: Secondary | ICD-10-CM

## 2019-11-01 DIAGNOSIS — F05 Delirium due to known physiological condition: Secondary | ICD-10-CM | POA: Diagnosis present

## 2019-11-01 DIAGNOSIS — Z8042 Family history of malignant neoplasm of prostate: Secondary | ICD-10-CM

## 2019-11-01 DIAGNOSIS — S3730XA Unspecified injury of urethra, initial encounter: Secondary | ICD-10-CM | POA: Diagnosis not present

## 2019-11-01 DIAGNOSIS — I714 Abdominal aortic aneurysm, without rupture: Secondary | ICD-10-CM | POA: Diagnosis present

## 2019-11-01 DIAGNOSIS — I480 Paroxysmal atrial fibrillation: Secondary | ICD-10-CM | POA: Diagnosis present

## 2019-11-01 DIAGNOSIS — I10 Essential (primary) hypertension: Secondary | ICD-10-CM | POA: Diagnosis not present

## 2019-11-01 DIAGNOSIS — E860 Dehydration: Secondary | ICD-10-CM | POA: Diagnosis present

## 2019-11-01 DIAGNOSIS — Z79899 Other long term (current) drug therapy: Secondary | ICD-10-CM

## 2019-11-01 DIAGNOSIS — A419 Sepsis, unspecified organism: Secondary | ICD-10-CM | POA: Diagnosis present

## 2019-11-01 DIAGNOSIS — E785 Hyperlipidemia, unspecified: Secondary | ICD-10-CM | POA: Diagnosis present

## 2019-11-01 DIAGNOSIS — R8281 Pyuria: Secondary | ICD-10-CM | POA: Diagnosis present

## 2019-11-01 DIAGNOSIS — J439 Emphysema, unspecified: Secondary | ICD-10-CM | POA: Diagnosis present

## 2019-11-01 DIAGNOSIS — G9340 Encephalopathy, unspecified: Secondary | ICD-10-CM | POA: Diagnosis not present

## 2019-11-01 DIAGNOSIS — J90 Pleural effusion, not elsewhere classified: Secondary | ICD-10-CM | POA: Diagnosis present

## 2019-11-01 DIAGNOSIS — Z8673 Personal history of transient ischemic attack (TIA), and cerebral infarction without residual deficits: Secondary | ICD-10-CM

## 2019-11-01 DIAGNOSIS — T83091A Other mechanical complication of indwelling urethral catheter, initial encounter: Secondary | ICD-10-CM | POA: Diagnosis not present

## 2019-11-01 DIAGNOSIS — F0280 Dementia in other diseases classified elsewhere without behavioral disturbance: Secondary | ICD-10-CM | POA: Diagnosis not present

## 2019-11-01 DIAGNOSIS — Z7901 Long term (current) use of anticoagulants: Secondary | ICD-10-CM

## 2019-11-01 DIAGNOSIS — N390 Urinary tract infection, site not specified: Secondary | ICD-10-CM | POA: Diagnosis present

## 2019-11-01 DIAGNOSIS — R339 Retention of urine, unspecified: Secondary | ICD-10-CM | POA: Diagnosis present

## 2019-11-01 DIAGNOSIS — G309 Alzheimer's disease, unspecified: Secondary | ICD-10-CM

## 2019-11-01 DIAGNOSIS — G9341 Metabolic encephalopathy: Secondary | ICD-10-CM | POA: Diagnosis present

## 2019-11-01 DIAGNOSIS — Z881 Allergy status to other antibiotic agents status: Secondary | ICD-10-CM

## 2019-11-01 DIAGNOSIS — I959 Hypotension, unspecified: Secondary | ICD-10-CM | POA: Diagnosis present

## 2019-11-01 DIAGNOSIS — Z87891 Personal history of nicotine dependence: Secondary | ICD-10-CM

## 2019-11-01 DIAGNOSIS — U071 COVID-19: Secondary | ICD-10-CM

## 2019-11-01 DIAGNOSIS — Z978 Presence of other specified devices: Secondary | ICD-10-CM | POA: Diagnosis not present

## 2019-11-01 DIAGNOSIS — F0390 Unspecified dementia without behavioral disturbance: Secondary | ICD-10-CM | POA: Diagnosis not present

## 2019-11-01 DIAGNOSIS — J948 Other specified pleural conditions: Secondary | ICD-10-CM | POA: Diagnosis present

## 2019-11-01 DIAGNOSIS — Z882 Allergy status to sulfonamides status: Secondary | ICD-10-CM

## 2019-11-01 DIAGNOSIS — Z87442 Personal history of urinary calculi: Secondary | ICD-10-CM

## 2019-11-01 DIAGNOSIS — J9601 Acute respiratory failure with hypoxia: Secondary | ICD-10-CM | POA: Diagnosis present

## 2019-11-01 DIAGNOSIS — I4891 Unspecified atrial fibrillation: Secondary | ICD-10-CM | POA: Diagnosis present

## 2019-11-01 DIAGNOSIS — I4811 Longstanding persistent atrial fibrillation: Secondary | ICD-10-CM | POA: Diagnosis not present

## 2019-11-01 DIAGNOSIS — K4090 Unilateral inguinal hernia, without obstruction or gangrene, not specified as recurrent: Secondary | ICD-10-CM | POA: Diagnosis not present

## 2019-11-01 DIAGNOSIS — Z2239 Carrier of other specified bacterial diseases: Secondary | ICD-10-CM

## 2019-11-01 DIAGNOSIS — R338 Other retention of urine: Secondary | ICD-10-CM | POA: Diagnosis present

## 2019-11-01 DIAGNOSIS — R000 Tachycardia, unspecified: Secondary | ICD-10-CM | POA: Diagnosis present

## 2019-11-01 DIAGNOSIS — R319 Hematuria, unspecified: Secondary | ICD-10-CM | POA: Diagnosis not present

## 2019-11-01 DIAGNOSIS — J1282 Pneumonia due to coronavirus disease 2019: Secondary | ICD-10-CM

## 2019-11-01 LAB — RESPIRATORY PANEL BY RT PCR (FLU A&B, COVID)
Influenza A by PCR: NEGATIVE
Influenza B by PCR: NEGATIVE
SARS Coronavirus 2 by RT PCR: POSITIVE — AB

## 2019-11-01 LAB — CBC WITH DIFFERENTIAL/PLATELET
Abs Immature Granulocytes: 0.08 10*3/uL — ABNORMAL HIGH (ref 0.00–0.07)
Basophils Absolute: 0.1 10*3/uL (ref 0.0–0.1)
Basophils Relative: 1 %
Eosinophils Absolute: 0.6 10*3/uL — ABNORMAL HIGH (ref 0.0–0.5)
Eosinophils Relative: 6 %
HCT: 34.1 % — ABNORMAL LOW (ref 39.0–52.0)
Hemoglobin: 10 g/dL — ABNORMAL LOW (ref 13.0–17.0)
Immature Granulocytes: 1 %
Lymphocytes Relative: 12 %
Lymphs Abs: 1.3 10*3/uL (ref 0.7–4.0)
MCH: 29.9 pg (ref 26.0–34.0)
MCHC: 29.3 g/dL — ABNORMAL LOW (ref 30.0–36.0)
MCV: 102.1 fL — ABNORMAL HIGH (ref 80.0–100.0)
Monocytes Absolute: 1 10*3/uL (ref 0.1–1.0)
Monocytes Relative: 9 %
Neutro Abs: 7.6 10*3/uL (ref 1.7–7.7)
Neutrophils Relative %: 71 %
Platelets: 291 10*3/uL (ref 150–400)
RBC: 3.34 MIL/uL — ABNORMAL LOW (ref 4.22–5.81)
RDW: 15 % (ref 11.5–15.5)
WBC: 10.6 10*3/uL — ABNORMAL HIGH (ref 4.0–10.5)
nRBC: 0 % (ref 0.0–0.2)

## 2019-11-01 LAB — COMPREHENSIVE METABOLIC PANEL
ALT: 20 U/L (ref 0–44)
AST: 25 U/L (ref 15–41)
Albumin: 1.8 g/dL — ABNORMAL LOW (ref 3.5–5.0)
Alkaline Phosphatase: 89 U/L (ref 38–126)
Anion gap: 8 (ref 5–15)
BUN: 21 mg/dL (ref 8–23)
CO2: 28 mmol/L (ref 22–32)
Calcium: 7.9 mg/dL — ABNORMAL LOW (ref 8.9–10.3)
Chloride: 112 mmol/L — ABNORMAL HIGH (ref 98–111)
Creatinine, Ser: 1.32 mg/dL — ABNORMAL HIGH (ref 0.61–1.24)
GFR calc Af Amer: 57 mL/min — ABNORMAL LOW (ref >60)
GFR calc non Af Amer: 50 mL/min — ABNORMAL LOW (ref >60)
Glucose, Bld: 101 mg/dL — ABNORMAL HIGH (ref 70–99)
Potassium: 3.9 mmol/L (ref 3.5–5.1)
Sodium: 148 mmol/L — ABNORMAL HIGH (ref 135–145)
Total Bilirubin: 1.4 mg/dL — ABNORMAL HIGH (ref 0.3–1.2)
Total Protein: 5.8 g/dL — ABNORMAL LOW (ref 6.5–8.1)

## 2019-11-01 LAB — URINALYSIS, COMPLETE (UACMP) WITH MICROSCOPIC
Bacteria, UA: NONE SEEN
Bilirubin Urine: NEGATIVE
Glucose, UA: NEGATIVE mg/dL
Ketones, ur: NEGATIVE mg/dL
Nitrite: NEGATIVE
Protein, ur: 30 mg/dL — AB
RBC / HPF: >50 RBC/hpf — ABNORMAL HIGH (ref 0–5)
Specific Gravity, Urine: 1.016 (ref 1.005–1.030)
Squamous Epithelial / HPF: NONE SEEN (ref 0–5)
WBC, UA: >50 WBC/hpf — ABNORMAL HIGH (ref 0–5)
pH: 6 (ref 5.0–8.0)

## 2019-11-01 LAB — PROCALCITONIN: Procalcitonin: <0.1 ng/mL

## 2019-11-01 LAB — FIBRIN DERIVATIVES D-DIMER (ARMC ONLY): Fibrin derivatives D-dimer (ARMC): 1837.72 ng/mL (FEU) — ABNORMAL HIGH (ref 0.00–499.00)

## 2019-11-01 LAB — ABO/RH: ABO/RH(D): O POS

## 2019-11-01 LAB — LACTIC ACID, PLASMA: Lactic Acid, Venous: 1.1 mmol/L (ref 0.5–1.9)

## 2019-11-01 LAB — POC SARS CORONAVIRUS 2 AG: SARS Coronavirus 2 Ag: NEGATIVE

## 2019-11-01 MED ORDER — AMIODARONE HCL 200 MG PO TABS
200.0000 mg | ORAL_TABLET | Freq: Every day | ORAL | Status: DC
  Administered 2019-11-01 – 2019-11-04 (×4): 200 mg via ORAL
  Filled 2019-11-01 (×4): qty 1

## 2019-11-01 MED ORDER — SODIUM CHLORIDE 0.9 % IV BOLUS: 1000.0000 mL | Freq: Once | INTRAVENOUS | Status: DC

## 2019-11-01 MED ORDER — DONEPEZIL HCL 5 MG PO TABS
10.0000 mg | ORAL_TABLET | Freq: Every day | ORAL | Status: DC
  Administered 2019-11-01: 10 mg via ORAL
  Filled 2019-11-01: qty 2

## 2019-11-01 MED ORDER — SODIUM CHLORIDE 0.9 % IV SOLN
2.0000 g | Freq: Once | INTRAVENOUS | Status: AC
  Administered 2019-11-01: 2 g via INTRAVENOUS
  Filled 2019-11-01: qty 2

## 2019-11-01 MED ORDER — SODIUM CHLORIDE 0.9 % IV SOLN: 1.0000 g | INTRAVENOUS | Status: DC

## 2019-11-01 MED ORDER — ENSURE ENLIVE PO LIQD
237.0000 mL | Freq: Two times a day (BID) | ORAL | Status: DC
  Administered 2019-11-02 – 2019-11-05 (×6): 237 mL via ORAL

## 2019-11-01 MED ORDER — SODIUM CHLORIDE 0.9 % IV SOLN
1.0000 g | INTRAVENOUS | Status: DC
  Administered 2019-11-01: 1000 mg via INTRAVENOUS
  Filled 2019-11-01 (×3): qty 1

## 2019-11-01 MED ORDER — FINASTERIDE 5 MG PO TABS
5.0000 mg | ORAL_TABLET | Freq: Every day | ORAL | Status: DC
  Administered 2019-11-01 – 2019-11-05 (×5): 5 mg via ORAL
  Filled 2019-11-01 (×5): qty 1

## 2019-11-01 MED ORDER — TAMSULOSIN HCL 0.4 MG PO CAPS
0.4000 mg | ORAL_CAPSULE | Freq: Every day | ORAL | Status: DC
  Administered 2019-11-01 – 2019-11-05 (×5): 0.4 mg via ORAL
  Filled 2019-11-01 (×5): qty 1

## 2019-11-01 MED ORDER — MIRTAZAPINE 15 MG PO TABS
15.0000 mg | ORAL_TABLET | Freq: Every day | ORAL | Status: DC
  Administered 2019-11-01 – 2019-11-04 (×4): 15 mg via ORAL
  Filled 2019-11-01 (×5): qty 1

## 2019-11-01 MED ORDER — VANCOMYCIN HCL IN DEXTROSE 1-5 GM/200ML-% IV SOLN
1000.0000 mg | Freq: Once | INTRAVENOUS | Status: AC
  Administered 2019-11-01: 1000 mg via INTRAVENOUS
  Filled 2019-11-01: qty 200

## 2019-11-01 MED ORDER — MEMANTINE HCL 5 MG PO TABS
10.0000 mg | ORAL_TABLET | Freq: Two times a day (BID) | ORAL | Status: DC
  Administered 2019-11-01 – 2019-11-05 (×8): 10 mg via ORAL
  Filled 2019-11-01 (×8): qty 2

## 2019-11-01 MED ORDER — BISACODYL 10 MG RE SUPP: 10.0000 mg | Freq: Every day | RECTAL | Status: DC | PRN

## 2019-11-01 MED ORDER — ROSUVASTATIN CALCIUM 10 MG PO TABS
40.0000 mg | ORAL_TABLET | Freq: Every day | ORAL | Status: DC
  Administered 2019-11-01 – 2019-11-04 (×4): 40 mg via ORAL
  Filled 2019-11-01 (×2): qty 4
  Filled 2019-11-01: qty 2
  Filled 2019-11-01: qty 4

## 2019-11-01 MED ORDER — LACTATED RINGERS IV SOLN: INTRAVENOUS | Status: DC

## 2019-11-01 MED ORDER — APIXABAN 5 MG PO TABS
5.0000 mg | ORAL_TABLET | Freq: Two times a day (BID) | ORAL | Status: DC
  Administered 2019-11-01 – 2019-11-02 (×3): 5 mg via ORAL
  Filled 2019-11-01 (×3): qty 1

## 2019-11-01 MED ORDER — POLYETHYLENE GLYCOL 3350 17 G PO PACK: 17.0000 g | PACK | Freq: Every day | ORAL | Status: DC | PRN

## 2019-11-01 MED ORDER — LACTATED RINGERS IV BOLUS (SEPSIS)
250.0000 mL | Freq: Once | INTRAVENOUS | Status: AC
  Administered 2019-11-01: 250 mL via INTRAVENOUS

## 2019-11-01 MED ORDER — LACTATED RINGERS IV BOLUS (SEPSIS)
1000.0000 mL | Freq: Once | INTRAVENOUS | Status: AC
  Administered 2019-11-01: 1000 mL via INTRAVENOUS

## 2019-11-01 MED ORDER — DEXAMETHASONE 4 MG PO TABS
6.0000 mg | ORAL_TABLET | Freq: Every day | ORAL | Status: DC
  Administered 2019-11-01 – 2019-11-04 (×3): 6 mg via ORAL
  Filled 2019-11-01 (×2): qty 1.5
  Filled 2019-11-01: qty 2
  Filled 2019-11-01: qty 1.5

## 2019-11-01 NOTE — ED Notes (Signed)
Pt to CT

## 2019-11-01 NOTE — ED Notes (Signed)
Pt with sats dropping to 88-90% on room air, O2 2 L [place] started, sats now 99% on 2 OL [place]

## 2019-11-01 NOTE — Progress Notes (Addendum)
PHARMACY -  BRIEF ANTIBIOTIC NOTE   Pharmacy has received consult(s) for cefepime and vancomycin from an ED provider.  The patient's profile has been reviewed for ht/wt/allergies/indication/available labs.    One time order(s) placed for by MD for cefepime 2 gm and Vancomycin 1g x 1 dose each.  Further antibiotics/pharmacy consults should be ordered by admitting physician if indicated.                       Pearla Dubonnet, PharmD Clinical Pharmacist 11/01/2019 2:40 PM

## 2019-11-01 NOTE — ED Triage Notes (Signed)
Pt in via EMS from MD office. Pt daughter reports that pt was seeing his urologist and his BP was low so they sent him to the ED. Pt has a urinary catheter in place.

## 2019-11-01 NOTE — ED Notes (Signed)
Pt with small amount of urine output in foley that came with pt, bladder scan greater than 569 ml in bladder, foley removed per MD order and replaced, small blood clot noted in bag after new foley inserted, foley draining well at this time. Urine cloudy.

## 2019-11-01 NOTE — Progress Notes (Signed)
Patient presented to clinic today for scheduled voiding trial following recent hospitalization.  Triage vitals significant for hypotension to the 70s/30s which remained consistent across electronic versus manual reads and between arms.  SPO2 consistently between 90 and 92% when measured on both hands.  I have sent him to the emergency room for further evaluation.  Today, he reports feeling well.  He states he is eating and drinking okay.  He denies seeing blood in his urine or stool.  He does appear listless on physical exam.  I contacted his daughter, [name], to inform her.  She expressed understanding.  We will plan for voiding trial on another day.

## 2019-11-01 NOTE — ED Notes (Addendum)
Pt given meal box and drink, tolerating well. Pt with O2 sats dropping with movement in bed, decreased to 89% on 2 L. Pt increased to 3 L [place], O2 sats now. 97 % on 3 L [place]

## 2019-11-01 NOTE — H&P (Addendum)
History and Physical:    Chris Simon   [id number] DOB: 1934-03-30 DOA: 11/01/2019  Referring MD/provider: Joni Fears PCP: [name], MD   Patient coming from: SNF  Chief Complaint: Hypotension  History of Present Illness:   Chris Simon is an 86 y.o. male past medical history significant for dementia, BPH with urinary retention requiring chronic Foley catheter, HTN, A. fib, COPD who was brought in from urology clinic for hypotension and weakness.  History is entirely per patient's daughter who was at bedside.  Patient was doing relatively well until December 2021 when he was diagnosed with Covid.  He was admitted to [facility] and treated for both Covid pneumonia as well as a UTI with ESBL E. coli.  Patient was discharged to a rehab facility after a prolonged hospital stay but was readmitted here for recurrent hypoxic respiratory failure with multifocal pneumonia.  Procalcitonin was 0.10 and he was treated with antibiotics with improvement in symptoms and patient was returned back to [facility].  Today patient had a routine appointment with urology for voiding trial however at the time he was noted to be extremely hypotensive and very weak.  Patient was sent to the ED.  ED Course:  The patient was noted to be extremely hypotensive with initial blood pressure of 69/56.  Sepsis protocol was initiated and patient was treated with aggressive fluid resuscitation and broad-spectrum antibiotics.  Procalcitonin came back < 0.10.  Chest x-ray reveals extensive multifocal airway disease now with new pleural effusions, tiny pneumothoraces and hydrothoraces.  ROS:   ROS   Review of Systems: Patient unable to provide  Past Medical History:   Past Medical History:  Diagnosis Date  . Aneurysm ([place])    Abdominal Aortic Aneurysm  . Atrial fibrillation ([place])   . Blockage of a bile duct   . BPH with obstruction/lower urinary tract symptoms   . Cellulitis of scrotum   . COPD  (chronic obstructive pulmonary disease) ([place])   . Dementia ([place])   . Diverticulosis   . Dyspnea    with exertion  . Dysrhythmia   . Elevated PSA   . Epididymitis   . Gallstone   . Gross hematuria   . History of kidney stones   . Hyperlipidemia   . Kidney stones   . Nocturia   . Testicular pain   . TIA (transient ischemic attack)    June 2018  . Urinary retention     Past Surgical History:   Past Surgical History:  Procedure Laterality Date  . CHOLECYSTECTOMY N/A 07/11/2015   Procedure: LAPAROSCOPIC CHOLECYSTECTOMY;  Surgeon: Rolm Bookbinder, MD;  Location: [place];  Service: General;  Laterality: N/A;  . ERCP N/A 07/09/2015   Procedure: ENDOSCOPIC RETROGRADE CHOLANGIOPANCREATOGRAPHY (ERCP);  Surgeon: Clarene Essex, MD;  Location: [facility];  Service: Endoscopy;  Laterality: N/A;  . INGUINAL HERNIA REPAIR Left 04/07/2017   Procedure: HERNIA REPAIR INGUINAL ADULT;  Surgeon: Clayburn Pert, MD;  Location: [facility];  Service: General;  Laterality: Left;  . INSERTION OF MESH Left 04/07/2017   Procedure: INSERTION OF MESH;  Surgeon: Clayburn Pert, MD;  Location: [facility];  Service: General;  Laterality: Left;  . KYPHOPLASTY N/A 11/16/2018   Procedure: KYPHOPLASTY T12;  Surgeon: Hessie Knows, MD;  Location: [facility];  Service: Orthopedics;  Laterality: N/A;  . LEFT HEART CATH AND CORONARY ANGIOGRAPHY N/A 01/10/2018   Procedure: LEFT HEART CATH AND CORONARY ANGIOGRAPHY;  Surgeon: Dionisio David, MD;  Location: [place] [facility];  Service: Cardiovascular;  Laterality: N/A;    Social History:   Social History   Socioeconomic History  . Marital status: Widowed    Spouse name: Not on file  . Number of children: Not on file  . Years of education: Not on file  . Highest education level: Not on file  Occupational History  . Not on file  Tobacco Use  . Smoking status: Former Smoker    Packs/day: 0.50    Types: Cigarettes  . Smokeless tobacco: Never Used  . Tobacco  comment: quit 40 years  Substance and Sexual Activity  . Alcohol use: No    Alcohol/week: 0.0 standard drinks  . Drug use: No  . Sexual activity: Never  Other Topics Concern  . Not on file  Social History Narrative  . Not on file   Social Determinants of Health   Financial Resource Strain:   . Difficulty of Paying Living Expenses: Not on file  Food Insecurity:   . Worried About Charity fundraiser in the Last Year: Not on file  . Ran Out of Food in the Last Year: Not on file  Transportation Needs:   . Lack of Transportation (Medical): Not on file  . Lack of Transportation (Non-Medical): Not on file  Physical Activity:   . Days of Exercise per Week: Not on file  . Minutes of Exercise per Session: Not on file  Stress:   . Feeling of Stress : Not on file  Social Connections:   . Frequency of Communication with Friends and Family: Not on file  . Frequency of Social Gatherings with Friends and Family: Not on file  . Attends Religious Services: Not on file  . Active Member of Clubs or Organizations: Not on file  . Attends Archivist Meetings: Not on file  . Marital Status: Not on file  Intimate Partner Violence:   . Fear of Current or Ex-Partner: Not on file  . Emotionally Abused: Not on file  . Physically Abused: Not on file  . Sexually Abused: Not on file    Allergies   Clindamycin hcl and Sulfamethoxazole-trimethoprim  Family history:   Family History  Problem Relation Age of Onset  . Prostate cancer Brother   . Kidney disease Neg Hx   . Bladder Cancer Neg Hx     Current Medications:   Prior to Admission medications   Medication Sig Start Date End Date Taking? Authorizing Provider  albuterol (VENTOLIN HFA) 108 (90 Base) MCG/ACT inhaler Inhale 1-2 puffs into the lungs every 4 (four) hours as needed for wheezing or shortness of breath. 10/25/19  Yes Fritzi Mandes, MD  amiodarone (PACERONE) 200 MG tablet Take 200 mg by mouth at bedtime.    Yes [provider]  apixaban (ELIQUIS) 5 MG TABS tablet Take 1 tablet (5 mg total) by mouth 2 (two) times daily. 02/21/17  Yes Demetrios Loll, MD  carvedilol (COREG) 6.25 MG tablet Take 6.25 mg by mouth 2 (two) times daily.   Yes [provider]  Cyanocobalamin (B-12) 2500 MCG TABS Take 2,500 mg by mouth daily.   Yes [provider]  donepezil (ARICEPT) 10 MG tablet Take 10 mg by mouth at bedtime.   Yes [provider]  enalapril (VASOTEC) 2.5 MG tablet Take 2.5 mg by mouth at bedtime.  04/12/18  Yes [provider]  feeding supplement, ENSURE ENLIVE, (ENSURE ENLIVE) LIQD Take 237 mLs by mouth 2 (two) times daily between meals. 10/25/19  Yes Fritzi Mandes,  MD  finasteride (PROSCAR) 5 MG tablet Take 1 tablet (5 mg total) by mouth daily. 07/05/19  Yes McGowan, Larene Beach A, PA-C  furosemide (LASIX) 20 MG tablet Take 20 mg by mouth daily as needed for edema. >3lbs in 24 hours 04/26/18  Yes [provider]  isosorbide mononitrate (IMDUR) 30 MG 24 hr tablet Take 30 mg by mouth at bedtime.  11/22/16  Yes [provider]  loratadine (CLARITIN) 10 MG tablet Take 10 mg by mouth daily as needed for allergies.   Yes [provider]  memantine (NAMENDA) 10 MG tablet Take 10 mg by mouth 2 (two) times daily.    Yes [provider]  mirtazapine (REMERON) 15 MG tablet Take 15 mg by mouth at bedtime.  04/17/18  Yes [provider]  Multiple Vitamin (MULTIVITAMIN WITH MINERALS) TABS tablet Take 1 tablet by mouth daily.   Yes [provider]  rosuvastatin (CRESTOR) 40 MG tablet Take 40 mg by mouth at bedtime.   Yes [provider]  tamsulosin (FLOMAX) 0.4 MG CAPS capsule Take 1 capsule (0.4 mg total) by mouth daily. 07/05/19  Yes [name], PA-C    Physical Exam:   Vitals:   11/01/19 1609 11/01/19 1625 11/01/19 1700 11/01/19 1730  BP: (!) 125/51  115/67 105/65  Pulse: 62 61 62 (!) 55  Resp:  15  17  SpO2: 99% 100% 100% 98%    Weight:      Height:         Physical Exam: Blood pressure 105/65, pulse (!) 55, resp. rate 17, height 6' (1.829 m), weight 74.8 kg, SpO2 98 %. Gen: Chronically ill-appearing man lying curled up in bed in no acute respiratory distress with attentive daughter at bedside CVS: S1-S2 clear, regular with 2 of 6 systolic murmur  respiratory: Coarse breath sounds with scattered rhonchi and rales. GI: NABS, soft, NT,  LE: No edema. No cyanosis   Data Review:    Labs: Basic Metabolic Panel: Recent Labs  Lab 11/01/19 1129  NA 148*  K 3.9  CL 112*  CO2 28  GLUCOSE 101*  BUN 21  CREATININE 1.32*  CALCIUM 7.9*   Liver Function Tests: Recent Labs  Lab 11/01/19 1129  AST 25  ALT 20  ALKPHOS 89  BILITOT 1.4*  PROT 5.8*  ALBUMIN 1.8*   No results for input(s): LIPASE, AMYLASE in the last 168 hours. No results for input(s): AMMONIA in the last 168 hours. CBC: Recent Labs  Lab 11/01/19 1129  WBC 10.6*  NEUTROABS 7.6  HGB 10.0*  HCT 34.1*  MCV 102.1*  PLT 291   Cardiac Enzymes: No results for input(s): CKTOTAL, CKMB, CKMBINDEX, TROPONINI in the last 168 hours.  BNP (last 3 results) No results for input(s): PROBNP in the last 8760 hours. CBG: No results for input(s): GLUCAP in the last 168 hours.  Urinalysis    Component Value Date/Time   COLORURINE YELLOW (A) 10/22/2019 0903   APPEARANCEUR CLOUDY (A) 10/22/2019 0903   APPEARANCEUR Cloudy (A) 07/05/2019 0857   LABSPEC 1.012 10/22/2019 0903   LABSPEC 1.019 06/21/2014 1800   PHURINE 5.0 10/22/2019 0903   GLUCOSEU NEGATIVE 10/22/2019 0903   GLUCOSEU Negative 06/21/2014 1800   HGBUR LARGE (A) 10/22/2019 0903   BILIRUBINUR NEGATIVE 10/22/2019 0903   BILIRUBINUR Negative 07/05/2019 0857   BILIRUBINUR Negative 06/21/2014 1800   KETONESUR NEGATIVE 10/22/2019 0903   PROTEINUR NEGATIVE 10/22/2019 0903   NITRITE NEGATIVE 10/22/2019 0903   LEUKOCYTESUR MODERATE (A) 10/22/2019 [id number]  LEUKOCYTESUR Trace 06/21/2014  1800      Radiographic Studies: DG Chest 1 View  Result Date: 11/01/2019 CLINICAL DATA:  Hypotension today. History of COPD. EXAM: CHEST  1 VIEW COMPARISON:  Single-view of the chest 10/22/2019 and 02/19/2017. FINDINGS: Extensive multifocal airspace disease seen on the comparison examination persists. There is a new small left pneumothorax estimated at 15-20%. The right lung is expanded. Heart size is normal. No acute or focal bony abnormality. IMPRESSION: 1. New small left pneumothorax estimated at 15-20%. 2. No change in extensive bilateral airspace disease. 3. Critical Value/emergent results were called by telephone at the time of interpretation on 11/01/2019 at 12:25 pm to provider PHILLIP STAFFORD , who verbally acknowledged these results. Electronically Signed   By: Inge Rise M.D.   On: 11/01/2019 12:27   CT Chest Wo Contrast  Result Date: 11/01/2019 CLINICAL DATA:  Hypotension, pneumonia, abnormal chest x-ray EXAM: CT CHEST WITHOUT CONTRAST TECHNIQUE: Multidetector CT imaging of the chest was performed following the standard protocol without IV contrast. COMPARISON:  Chest x-ray 11/01/2019, 10/22/2019; CT 09/17/2013 FINDINGS: Cardiovascular: Heart size is normal. No pericardial effusion. Thoracic aorta is nonaneurysmal. Extensive atherosclerotic calcifications of the aorta and coronary arteries. Mediastinum/Nodes: Enlarged thyroid gland extending into the superior mediastinum, similar to prior CT. Trachea and esophagus within normal limits. No axillary, mediastinal, or hilar lymphadenopathy. Lungs/Pleura: Small left-sided pneumothorax involving approximately 5% of the lung volume with left apical and small anterior components including associated layering pleural fluid compatible with hydropneumothorax. There is a tiny right lateral pneumothorax (series 3, image 60) also with loculated pleural fluid. Moderate left and small right pleural effusions. Extensive multifocal airspace consolidations  scattered throughout both lungs compatible with multifocal pneumonia there is mild-to-moderate centrilobular and paraseptal emphysema. Scattered small pulmonary nodules bilaterally. Upper Abdomen: No acute findings within the visualized portion of the upper abdomen. Colonic diverticulosis. Small amount of pneumobilia, presumably related to post cholecystectomy changes. Musculoskeletal: Prior cement augmentation related to vertebral body compression fracture at T12. No acute osseous abnormality or chest wall lesion. IMPRESSION: 1. Small left hydropneumothorax involving approximately 5% of the left lung volume. 2. Tiny right-sided hydropneumothorax involving less than 5% of the right lung volume. 3. Extensive multifocal airspace consolidations scattered throughout both lungs compatible with multifocal pneumonia. Radiographic follow-up to resolution is recommended to exclude underlying malignancy. 4. Moderate left and small right pleural effusions. 5. Enlarged thyroid gland extending into the superior mediastinum, similar to prior CT. Aortic Atherosclerosis (ICD10-I70.0) and Emphysema (ICD10-J43.9). These results were called by telephone at the time of interpretation on 11/01/2019 at 2:22 pm to provider PHILLIP STAFFORD , who verbally acknowledged these results. Electronically Signed   By: Davina Poke D.O.   On: 11/01/2019 14:24    EKG: Independently reviewed.  Sinus rhythm at 70.  Normal intervals.  Normal axis.  Diffusely flattened T waves.   Assessment/Plan:   Principal Problem:   Sepsis ([place]) Active Problems:   Lower urinary tract infectious disease   Atrial fibrillation (H[facility])   Bladder retention   Acute respiratory failure with hypoxia (H[facility])   Chronic indwelling Foley catheter   Alzheimer's dementia without behavioral disturbance (H[facility])   Pneumonia due to COVID-19 virus   COPD (chronic obstructive pulmonary disease) ([place])  86 year old man with third admission for acute hypoxic respiratory  failure and sepsis with sources including lungs and indwelling Foley.  It is difficult to know exactly what is the source of his hypotension.  ID consult requested for the morning.  PNEUMONIA with TINY  PTX AND HYDROTHORAX Patient's multifocal pneumonia may well be secondary to post Covid inflammation. Doubt bacterial superinfection given negative procalcitonin He also has developed tiny hydrothoraces and pneumothoraces with underlying baseline COPD Will order echocardiogram as well. Will treat with steroids Decadron 6 mg IV daily ID consult requested as noted above  SEPSIS As noted above it is unclear to me exactly the source of his sepsis there is urine or lung. ED consult requested as above In the mean while I am starting ertapenem for possible CAUTI given known history of ESBL and sepsis. This can be stopped in the morning if warranted. We will continue fluid resuscitation with LR at 150 cc an hour Hold all antihypertensives including carvedilol, enalapril and Lasix as well as Imdur  COVID  Patient was initially diagnosed in December 19 and treated with remdesivir and steroids at [facility]. I assume patient is having post Covid inflammation and have started steroids.  END OF LIFE DISCUSSION Spoke at length with patient's daughter about patient's prognosis. She notes that she just lost her husband to Covid pneumonia. She states she would not like her father to ever be intubated. However she would otherwise like resuscitation protocol. I stated that it would be highly unlikely for anyone to survive resuscitation without intubation and also that his likelihood of successful resuscitation would be extremely low. Daughter would like for patient to have a limited code with DNI but other resuscitation procedures to be followed including CPR.  DEMENTIA Continue donepezil, memantine and Remeron  COPD Continue home regimen of inhaled bronchodilators Small tiny pneumothoraces most likely popped  blebs from his emphysema.  ATRIAL FIBRILLATION Continue amiodarone, hold carvedilol Continue Eliquis  HTN Holding all antihypertensives as patient is quite hypertensive due to sepsis  BPH Continue finasteride and tamsulosin  Other information:   DVT prophylaxis: Eliquis ordered. Code Status: Patient is DNI after discussion with his daughter today. Family Communication: Spoke with patient's daughter extensively today Disposition Plan: SNF Consults called: ID for the morning Admission status: Inpatient  Lylee Corrow Tublu Tarrin Menn [facility]  If 7PM-7AM, please contact night-coverage [email or web address] Password [facility] 11/01/2019, 6:15 PM

## 2019-11-01 NOTE — ED Provider Notes (Signed)
[facility] Emergency Department Provider Note  ____________________________________________  Time seen: Approximately 1:15 PM  I have reviewed the triage vital signs and the nursing notes.   HISTORY  Chief Complaint Weakness and Hypotension    Level 5 Caveat: Portions of the History and Physical including HPI and review of systems are unable to be completely obtained due to patient dementia  HPI Chris Simon is a 86 y.o. male with a history of AAA, A. fib, COPD, dementia, gallstones, recent UTI requiring hospitalization and Foley catheter placement who was sent to the ED from urology clinic due to low blood pressure.  Daughter at bedside reports that the patient was discharged from the hospital about 2 weeks ago to [employer].  Since then he has reportedly been too weak to stand or walk or feed himself, requiring full assistance from staff.  They report feeding him full meals, but daughter feels like the patient's been losing weight.  Foley catheter has been maintained.  Symptoms are constant, worsening, no aggravating or alleviating factors.  No known falls or head trauma.  No fevers chills cough abdominal pain or other acute focal complaints.      Past Medical History:  Diagnosis Date  . Aneurysm ([place])    Abdominal Aortic Aneurysm  . Atrial fibrillation ([place])   . Blockage of a bile duct   . BPH with obstruction/lower urinary tract symptoms   . Cellulitis of scrotum   . COPD (chronic obstructive pulmonary disease) ([place])   . Dementia ([place])   . Diverticulosis   . Dyspnea    with exertion  . Dysrhythmia   . Elevated PSA   . Epididymitis   . Gallstone   . Gross hematuria   . History of kidney stones   . Hyperlipidemia   . Kidney stones   . Nocturia   . Testicular pain   . TIA (transient ischemic attack)    June 2018  . Urinary retention      Patient Active Problem List   Diagnosis Date Noted  . Acute respiratory failure with hypoxia  ([place]) 10/22/2019  . Chronic indwelling Foley catheter   . Alzheimer's dementia without behavioral disturbance ([place])   . Pneumonia 02/20/2017  . TIA (transient ischemic attack) 02/18/2017  . Left inguinal hernia 06/22/2016  . Umbilical hernia without obstruction and without gangrene 06/22/2016  . Calculus of kidney 09/03/2015  . Testicular cyst 09/03/2015  . Sepsis secondary to UTI ([place]) 07/20/2015  . BPH (benign prostatic hyperplasia) 07/18/2015  . Elevated PSA 07/18/2015  . Sepsis ([place]) 07/09/2015  . Acute kidney injury ([place])   . Paroxysmal A-fib ([place])   . UTI (lower urinary tract infection)   . Atrial fibrillation ([place]) 10/01/2014  . Pure hypercholesterolemia 10/01/2014  . Benign fibroma of prostate 10/06/2012  . Bladder retention 10/06/2012     Past Surgical History:  Procedure Laterality Date  . CHOLECYSTECTOMY N/A 07/11/2015   Procedure: LAPAROSCOPIC CHOLECYSTECTOMY;  Surgeon: Rolm Bookbinder, MD;  Location: [place];  Service: General;  Laterality: N/A;  . ERCP N/A 07/09/2015   Procedure: ENDOSCOPIC RETROGRADE CHOLANGIOPANCREATOGRAPHY (ERCP);  Surgeon: Clarene Essex, MD;  Location: [facility] ENDOSCOPY;  Service: Endoscopy;  Laterality: N/A;  . INGUINAL HERNIA REPAIR Left 04/07/2017   Procedure: HERNIA REPAIR INGUINAL ADULT;  Surgeon: Clayburn Pert, MD;  Location: [facility] ORS;  Service: General;  Laterality: Left;  . INSERTION OF MESH Left 04/07/2017   Procedure: INSERTION OF MESH;  Surgeon: Clayburn Pert, MD;  Location: [facility] ORS;  Service:  General;  Laterality: Left;  . KYPHOPLASTY N/A 11/16/2018   Procedure: KYPHOPLASTY T12;  Surgeon: Hessie Knows, MD;  Location: [facility] ORS;  Service: Orthopedics;  Laterality: N/A;  . LEFT HEART CATH AND CORONARY ANGIOGRAPHY N/A 01/10/2018   Procedure: LEFT HEART CATH AND CORONARY ANGIOGRAPHY;  Surgeon: Dionisio David, MD;  Location: [place] [facility];  Service: Cardiovascular;  Laterality: N/A;     Prior to Admission medications   Medication  Sig Start Date End Date Taking? Authorizing Provider  albuterol (VENTOLIN HFA) 108 (90 Base) MCG/ACT inhaler Inhale 1-2 puffs into the lungs every 4 (four) hours as needed for wheezing or shortness of breath. 10/25/19  Yes Fritzi Mandes, MD  amiodarone (PACERONE) 200 MG tablet Take 200 mg by mouth at bedtime.    Yes [provider]  apixaban (ELIQUIS) 5 MG TABS tablet Take 1 tablet (5 mg total) by mouth 2 (two) times daily. 02/21/17  Yes Demetrios Loll, MD  carvedilol (COREG) 6.25 MG tablet Take 6.25 mg by mouth 2 (two) times daily.   Yes [provider]  Cyanocobalamin (B-12) 2500 MCG TABS Take 2,500 mg by mouth daily.   Yes [provider]  donepezil (ARICEPT) 10 MG tablet Take 10 mg by mouth at bedtime.   Yes [provider]  enalapril (VASOTEC) 2.5 MG tablet Take 2.5 mg by mouth at bedtime.  04/12/18  Yes [provider]  feeding supplement, ENSURE ENLIVE, (ENSURE ENLIVE) LIQD Take 237 mLs by mouth 2 (two) times daily between meals. 10/25/19  Yes Fritzi Mandes, MD  finasteride (PROSCAR) 5 MG tablet Take 1 tablet (5 mg total) by mouth daily. 07/05/19  Yes McGowan, Larene Beach A, PA-C  furosemide (LASIX) 20 MG tablet Take 20 mg by mouth daily as needed for edema. >3lbs in 24 hours 04/26/18  Yes [provider]  isosorbide mononitrate (IMDUR) 30 MG 24 hr tablet Take 30 mg by mouth at bedtime.  11/22/16  Yes [provider]  loratadine (CLARITIN) 10 MG tablet Take 10 mg by mouth daily as needed for allergies.   Yes [provider]  memantine (NAMENDA) 10 MG tablet Take 10 mg by mouth 2 (two) times daily.    Yes [provider]  mirtazapine (REMERON) 15 MG tablet Take 15 mg by mouth at bedtime.  04/17/18  Yes [provider]  Multiple Vitamin (MULTIVITAMIN WITH MINERALS) TABS tablet Take 1 tablet by mouth daily.   Yes [provider]  rosuvastatin (CRESTOR) 40 MG tablet Take 40 mg by mouth at bedtime.   Yes [provider]  tamsulosin (FLOMAX) 0.4 MG CAPS capsule Take 1 capsule (0.4 mg total) by mouth daily. 07/05/19  Yes McGowan, Larene Beach A, PA-C     Allergies Clindamycin hcl and Sulfamethoxazole-trimethoprim   Family History  Problem Relation Age of Onset  . Prostate cancer Brother   . Kidney disease Neg Hx   . Bladder Cancer Neg Hx     Social History Social History   Tobacco Use  . Smoking status: Former Smoker    Packs/day: 0.50    Types: Cigarettes  . Smokeless tobacco: Never Used  . Tobacco comment: quit 40 years  Substance Use Topics  . Alcohol use: No    Alcohol/week: 0.0 standard drinks  . Drug use: No    Review of Systems Level 5 Caveat: Portions of the History and Physical including HPI and review of systems are unable to be completely obtained due to patient being a poor historian  Constitutional:   No known fever.  ENT:   No rhinorrhea. Cardiovascular:   No chest pain or syncope. Respiratory:   No dyspnea or cough. Gastrointestinal:   Negative for abdominal pain, vomiting and diarrhea.  Musculoskeletal:   Negative for focal pain or swelling ____________________________________________   PHYSICAL EXAM:  VITAL SIGNS: ED Triage Vitals  Enc Vitals Group     BP 11/01/19 1037 (!) 69/56     Pulse Rate 11/01/19 1037 78     Resp 11/01/19 1037 20     Temp --      Temp src --      SpO2 11/01/19 1037 95 %     Weight 11/01/19 1039 165 lb (74.8 kg)     Height 11/01/19 1039 6' (1.829 m)     Head Circumference --      Peak Flow --      Pain Score --      Pain Loc --      Pain Edu? --      Excl. in [place]? --     Vital signs reviewed, nursing assessments reviewed.   Constitutional:   Alert and oriented.  Ill-appearing Small amount of dark urine in the Foley collection bag Eyes:   Conjunctivae are normal. EOMI. PERRL. ENT      Head:   Normocephalic and atraumatic.      Nose:   No congestion/rhinnorhea.       Mouth/Throat:   Dry mucous membranes, no  pharyngeal erythema. No peritonsillar mass.       Neck:   No meningismus. Full ROM. Hematological/Lymphatic/Immunilogical:   No cervical lymphadenopathy. Cardiovascular:   RRR. Symmetric bilateral radial and DP pulses.  No murmurs. Cap refill 4 seconds. Respiratory:   Normal respiratory effort without tachypnea/retractions. Breath sounds are clear and equal bilaterally. No wheezes/rales/rhonchi. Gastrointestinal:   Soft and nontender. Non distended. There is no CVA tenderness.  No rebound, rigidity, or guarding. Musculoskeletal:   Normal range of motion in all extremities. No joint effusions.  No lower extremity tenderness.  No edema. Neurologic:   Normal speech and language.  Motor grossly intact. No acute focal neurologic deficits are appreciated.  Skin:    Skin is warm, dry and intact. No rash noted.  No petechiae, purpura, or bullae.  ____________________________________________    LABS (pertinent positives/negatives) (all labs ordered are listed, but only abnormal results are displayed) Labs Reviewed  COMPREHENSIVE METABOLIC PANEL - Abnormal; Notable for the following components:      Result Value   Sodium 148 (*)    Chloride 112 (*)    Glucose, Bld 101 (*)    Creatinine, Ser 1.32 (*)    Calcium 7.9 (*)    Total Protein 5.8 (*)    Albumin 1.8 (*)    Total Bilirubin 1.4 (*)    GFR calc non Af Amer 50 (*)    GFR calc Af Amer 57 (*)    All other components within normal limits  CBC WITH DIFFERENTIAL/PLATELET - Abnormal; Notable for the following components:   WBC 10.6 (*)    RBC 3.34 (*)    Hemoglobin 10.0 (*)    HCT 34.1 (*)    MCV 102.1 (*)    MCHC 29.3 (*)    Eosinophils Absolute 0.6 (*)    Abs Immature Granulocytes 0.08 (*)    All other components within normal limits  CULTURE, BLOOD (ROUTINE X 2)  CULTURE, BLOOD (ROUTINE X 2)  URINE CULTURE  RESPIRATORY PANEL BY RT PCR (  FLU A&B, COVID)  LACTIC ACID, PLASMA  PROCALCITONIN  URINALYSIS, COMPLETE (UACMP) WITH  MICROSCOPIC  POC SARS CORONAVIRUS 2 AG -  ED   ____________________________________________   EKG  Interpreted by me  Date: 11/01/2019  Rate: 77  Rhythm: normal sinus rhythm  QRS Axis: normal  Intervals: normal  ST/T Wave abnormalities: normal  Conduction Disutrbances: none  Narrative Interpretation: unremarkable      ____________________________________________    RADIOLOGY  DG Chest 1 View  Result Date: 11/01/2019 CLINICAL DATA:  Hypotension today. History of COPD. EXAM: CHEST  1 VIEW COMPARISON:  Single-view of the chest 10/22/2019 and 02/19/2017. FINDINGS: Extensive multifocal airspace disease seen on the comparison examination persists. There is a new small left pneumothorax estimated at 15-20%. The right lung is expanded. Heart size is normal. No acute or focal bony abnormality. IMPRESSION: 1. New small left pneumothorax estimated at 15-20%. 2. No change in extensive bilateral airspace disease. 3. Critical Value/emergent results were called by telephone at the time of interpretation on 11/01/2019 at 12:25 pm to provider Tobin Witucki , who verbally acknowledged these results. Electronically Signed   By: Inge Rise M.D.   On: 11/01/2019 12:27   CT Chest Wo Contrast  Result Date: 11/01/2019 CLINICAL DATA:  Hypotension, pneumonia, abnormal chest x-ray EXAM: CT CHEST WITHOUT CONTRAST TECHNIQUE: Multidetector CT imaging of the chest was performed following the standard protocol without IV contrast. COMPARISON:  Chest x-ray 11/01/2019, 10/22/2019; CT 09/17/2013 FINDINGS: Cardiovascular: Heart size is normal. No pericardial effusion. Thoracic aorta is nonaneurysmal. Extensive atherosclerotic calcifications of the aorta and coronary arteries. Mediastinum/Nodes: Enlarged thyroid gland extending into the superior mediastinum, similar to prior CT. Trachea and esophagus within normal limits. No axillary, mediastinal, or hilar lymphadenopathy. Lungs/Pleura: Small left-sided  pneumothorax involving approximately 5% of the lung volume with left apical and small anterior components including associated layering pleural fluid compatible with hydropneumothorax. There is a tiny right lateral pneumothorax (series 3, image 60) also with loculated pleural fluid. Moderate left and small right pleural effusions. Extensive multifocal airspace consolidations scattered throughout both lungs compatible with multifocal pneumonia there is mild-to-moderate centrilobular and paraseptal emphysema. Scattered small pulmonary nodules bilaterally. Upper Abdomen: No acute findings within the visualized portion of the upper abdomen. Colonic diverticulosis. Small amount of pneumobilia, presumably related to post cholecystectomy changes. Musculoskeletal: Prior cement augmentation related to vertebral body compression fracture at T12. No acute osseous abnormality or chest wall lesion. IMPRESSION: 1. Small left hydropneumothorax involving approximately 5% of the left lung volume. 2. Tiny right-sided hydropneumothorax involving less than 5% of the right lung volume. 3. Extensive multifocal airspace consolidations scattered throughout both lungs compatible with multifocal pneumonia. Radiographic follow-up to resolution is recommended to exclude underlying malignancy. 4. Moderate left and small right pleural effusions. 5. Enlarged thyroid gland extending into the superior mediastinum, similar to prior CT. Aortic Atherosclerosis (ICD10-I70.0) and Emphysema (ICD10-J43.9). These results were called by telephone at the time of interpretation on 11/01/2019 at 2:22 pm to provider Youcef Klas , who verbally acknowledged these results. Electronically Signed   By: Davina Poke D.O.   On: 11/01/2019 14:24    ____________________________________________   PROCEDURES .Critical Care Performed by: Carrie Mew, MD Authorized by: Carrie Mew, MD   Critical care provider statement:    Critical care time  (minutes):  35   Critical care time was exclusive of:  Separately billable procedures and treating other patients   Critical care was necessary to treat or prevent imminent or life-threatening deterioration of the following conditions:  Sepsis, shock and dehydration   Critical care was time spent personally by me on the following activities:  Development of treatment plan with patient or surrogate, discussions with consultants, evaluation of patient's response to treatment, examination of patient, obtaining history from patient or surrogate, ordering and performing treatments and interventions, ordering and review of laboratory studies, ordering and review of radiographic studies, pulse oximetry, re-evaluation of patient's condition and review of old charts    ____________________________________________  DIFFERENTIAL DIAGNOSIS   Pneumonia, pleural effusion, pulmonary edema, COVID-19, catheter associated UTI, dehydration, electrolyte abnormality, sepsis  CLINICAL IMPRESSION / ASSESSMENT AND PLAN / ED COURSE  Medications ordered in the ED: Medications  lactated ringers bolus 1,000 mL (1,000 mLs Intravenous New Bag/Given 11/01/19 1346)    And  lactated ringers bolus 1,000 mL (0 mLs Intravenous Stopped 11/01/19 1432)    And  lactated ringers bolus 250 mL (has no administration in time range)  vancomycin (VANCOCIN) IVPB 1000 mg/200 mL premix (has no administration in time range)  ceFEPIme (MAXIPIME) 2 g in sodium chloride 0.9 % 100 mL IVPB (0 g Intravenous Stopped 11/01/19 1432)    Pertinent labs & imaging results that were available during my care of the patient were reviewed by me and considered in my medical decision making (see chart for details).   Chris Simon was evaluated in Emergency Department on 11/01/2019 for the symptoms described in the history of present illness. He was evaluated in the context of the global COVID-19 pandemic, which necessitated consideration that the patient  might be at risk for infection with the SARS-CoV-2 virus that causes COVID-19. Institutional protocols and algorithms that pertain to the evaluation of patients at risk for COVID-19 are in a state of rapid change based on information released by regulatory bodies including the CDC and federal and state organizations. These policies and algorithms were followed during the patient's care in the ED.   Patient presents with hypotension and otherwise normal vital signs in the setting of indwelling Foley catheter and recently treated UTI.  High suspicion for sepsis related to recurrent/resistant UTI.  Will attempt Foley exchanged for clean UA, check labs and chest x-ray.  Code sepsis initiated and large-volume fluid resuscitation ordered due to hypotension.  Patient is on Coreg, beta-blocker, which may be preventing a physiologic compensatory tachycardia and exacerbating hypotension.  Clinical Course as of Oct 31 1508  Thu Nov 01, 2019  1227 Chest x-ray discussed with radiologist who notes a 15 to 20% left-sided pneumothorax which is new.   [PS]  [id number] CT chest discussed with radiology who notes small bilateral pneumothoraces attributable to underlying emphysema, but persistent multifocal pneumonia.  In addition to cefepime which is already given, I will order vancomycin for healthcare associated pneumonia and plan to admit.   [PS]    Clinical Course User Index [PS] Carrie Mew, MD    ----------------------------------------- 1:19 PM on 11/01/2019 -----------------------------------------  Sepsis reassessment has been completed.  Vital signs improved.  Map at goal.  Oxygenation remains normal.  Peripheral perfusion improved.  With the small pneumothorax, chest tube not feasible at this time, but I will obtain a CT scan of the chest to further evaluate for acute infection on top of the chronic findings related to his acute pneumothorax.  Plan to hospitalize for further evaluation and treatment to  ensure resolution of pneumothorax, follow-up culture data.Marland Kitchen  ----------------------------------------- 3:09 PM on 11/01/2019 -----------------------------------------  Procalcitonin negative, x-ray findings possibly due to Covid.  Still waiting for Covid testing due to  patient being in the hallway.  He did develop acute respiratory failure with hypoxia, 88% on room air necessitating 2 L nasal cannula.  Case discussed with the hospitalist   ____________________________________________   FINAL CLINICAL IMPRESSION(S) / ED DIAGNOSES    Final diagnoses:  Dehydration  Hypotension, unspecified hypotension type  Pneumothorax, unspecified type  Sepsis, due to unspecified organism, unspecified whether acute organ dysfunction present ([place])  Acute respiratory failure with hypoxia ([place])  acute hypoxic respiratory failure   ED Discharge Orders    None      Portions of this note were generated with dragon dictation software. Dictation errors may occur despite best attempts at proofreading.   Carrie Mew, MD 11/01/19 [phone]

## 2019-11-02 ENCOUNTER — Inpatient Hospital Stay
Admit: 2019-11-02 | Discharge: 2019-11-02 | Disposition: A | Discharge status: Home / Self Care | Payer: Medicare HMO | Attending: Internal Medicine | Admitting: Internal Medicine

## 2019-11-02 DIAGNOSIS — R339 Retention of urine, unspecified: Secondary | ICD-10-CM

## 2019-11-02 DIAGNOSIS — N401 Enlarged prostate with lower urinary tract symptoms: Secondary | ICD-10-CM

## 2019-11-02 DIAGNOSIS — R338 Other retention of urine: Secondary | ICD-10-CM

## 2019-11-02 DIAGNOSIS — Z87891 Personal history of nicotine dependence: Secondary | ICD-10-CM

## 2019-11-02 DIAGNOSIS — R319 Hematuria, unspecified: Secondary | ICD-10-CM

## 2019-11-02 DIAGNOSIS — Z960 Presence of urogenital implants: Secondary | ICD-10-CM

## 2019-11-02 DIAGNOSIS — Z881 Allergy status to other antibiotic agents status: Secondary | ICD-10-CM

## 2019-11-02 DIAGNOSIS — J449 Chronic obstructive pulmonary disease, unspecified: Secondary | ICD-10-CM

## 2019-11-02 DIAGNOSIS — Z87440 Personal history of urinary (tract) infections: Secondary | ICD-10-CM

## 2019-11-02 DIAGNOSIS — I10 Essential (primary) hypertension: Secondary | ICD-10-CM

## 2019-11-02 DIAGNOSIS — Z9181 History of falling: Secondary | ICD-10-CM

## 2019-11-02 DIAGNOSIS — Z8619 Personal history of other infectious and parasitic diseases: Secondary | ICD-10-CM

## 2019-11-02 DIAGNOSIS — I4891 Unspecified atrial fibrillation: Secondary | ICD-10-CM

## 2019-11-02 DIAGNOSIS — I959 Hypotension, unspecified: Principal | ICD-10-CM

## 2019-11-02 DIAGNOSIS — Z8616 Personal history of COVID-19: Secondary | ICD-10-CM

## 2019-11-02 DIAGNOSIS — Z79899 Other long term (current) drug therapy: Secondary | ICD-10-CM

## 2019-11-02 DIAGNOSIS — K4090 Unilateral inguinal hernia, without obstruction or gangrene, not specified as recurrent: Secondary | ICD-10-CM

## 2019-11-02 DIAGNOSIS — F0390 Unspecified dementia without behavioral disturbance: Secondary | ICD-10-CM

## 2019-11-02 LAB — CBC WITH DIFFERENTIAL/PLATELET
Abs Immature Granulocytes: 0.04 10*3/uL (ref 0.00–0.07)
Basophils Absolute: 0.1 10*3/uL (ref 0.0–0.1)
Basophils Relative: 1 %
Eosinophils Absolute: 0.3 10*3/uL (ref 0.0–0.5)
Eosinophils Relative: 2 %
HCT: 34.2 % — ABNORMAL LOW (ref 39.0–52.0)
Hemoglobin: 10.1 g/dL — ABNORMAL LOW (ref 13.0–17.0)
Immature Granulocytes: 0 %
Lymphocytes Relative: 7 %
Lymphs Abs: 0.7 10*3/uL (ref 0.7–4.0)
MCH: 30 pg (ref 26.0–34.0)
MCHC: 29.5 g/dL — ABNORMAL LOW (ref 30.0–36.0)
MCV: 101.5 fL — ABNORMAL HIGH (ref 80.0–100.0)
Monocytes Absolute: 0.2 10*3/uL (ref 0.1–1.0)
Monocytes Relative: 2 %
Neutro Abs: 9.2 10*3/uL — ABNORMAL HIGH (ref 1.7–7.7)
Neutrophils Relative %: 88 %
Platelets: 250 10*3/uL (ref 150–400)
RBC: 3.37 MIL/uL — ABNORMAL LOW (ref 4.22–5.81)
RDW: 14.8 % (ref 11.5–15.5)
WBC: 10.5 10*3/uL (ref 4.0–10.5)
nRBC: 0 % (ref 0.0–0.2)

## 2019-11-02 LAB — C-REACTIVE PROTEIN
CRP: 5.6 mg/dL — ABNORMAL HIGH (ref <1.0)
CRP: 5.5 mg/dL — ABNORMAL HIGH (ref <1.0)

## 2019-11-02 LAB — COMPREHENSIVE METABOLIC PANEL
ALT: 22 U/L (ref 0–44)
AST: 26 U/L (ref 15–41)
Albumin: 1.8 g/dL — ABNORMAL LOW (ref 3.5–5.0)
Alkaline Phosphatase: 83 U/L (ref 38–126)
Anion gap: 5 (ref 5–15)
BUN: 19 mg/dL (ref 8–23)
CO2: 27 mmol/L (ref 22–32)
Calcium: 7.6 mg/dL — ABNORMAL LOW (ref 8.9–10.3)
Chloride: 114 mmol/L — ABNORMAL HIGH (ref 98–111)
Creatinine, Ser: 1.06 mg/dL (ref 0.61–1.24)
GFR calc Af Amer: >60 mL/min (ref >60)
GFR calc non Af Amer: >60 mL/min (ref >60)
Glucose, Bld: 109 mg/dL — ABNORMAL HIGH (ref 70–99)
Potassium: 4.1 mmol/L (ref 3.5–5.1)
Sodium: 146 mmol/L — ABNORMAL HIGH (ref 135–145)
Total Bilirubin: 0.8 mg/dL (ref 0.3–1.2)
Total Protein: 5.6 g/dL — ABNORMAL LOW (ref 6.5–8.1)

## 2019-11-02 LAB — URINE CULTURE: Culture: <10000 — AB

## 2019-11-02 LAB — ECHOCARDIOGRAM COMPLETE
Height: 72 in
Weight: 2640 oz

## 2019-11-02 MED ORDER — CHLORHEXIDINE GLUCONATE CLOTH 2 % EX PADS
6.0000 | MEDICATED_PAD | Freq: Every day | CUTANEOUS | Status: DC
  Administered 2019-11-02 – 2019-11-04 (×3): 6 via TOPICAL

## 2019-11-02 MED ORDER — SODIUM CHLORIDE 0.9 % IV BOLUS
500.0000 mL | Freq: Once | INTRAVENOUS | Status: AC
  Administered 2019-11-02: 09:00:00 500 mL via INTRAVENOUS

## 2019-11-02 MED ORDER — FLUCONAZOLE IN SODIUM CHLORIDE 200-0.9 MG/100ML-% IV SOLN
200.0000 mg | INTRAVENOUS | Status: DC
  Administered 2019-11-02: 10:00:00 200 mg via INTRAVENOUS
  Filled 2019-11-02: qty 100

## 2019-11-02 MED ORDER — SODIUM CHLORIDE 0.9 % IV BOLUS
500.0000 mL | Freq: Once | INTRAVENOUS | Status: AC
  Administered 2019-11-02: 03:00:00 500 mL via INTRAVENOUS

## 2019-11-02 MED ORDER — LIDOCAINE HCL URETHRAL/MUCOSAL 2 % EX GEL
1.0000 "application " | Freq: Once | CUTANEOUS | Status: AC
  Administered 2019-11-02: 1 via URETHRAL
  Filled 2019-11-02: qty 5

## 2019-11-02 NOTE — Progress Notes (Signed)
Pharmacy Antibiotic Note  Chris Simon is a 86 y.o. male admitted on 11/01/2019 with symptomatic cystitis.  Pharmacy has been consulted for Fluconazole dosing.  Plan: Fluconazole 200mg  IV q24hrs Switch to PO when appropriate  Height: 6' (182.9 cm) Weight: 165 lb (74.8 kg) IBW/kg (Calculated) : 77.6  Temp (24hrs), Avg:98.3 F (36.8 C), Min:98.3 F (36.8 C), Max:98.3 F (36.8 C)  Recent Labs  Lab 11/01/19 1129 11/02/19 0302  WBC 10.6* 10.5  CREATININE 1.32* 1.06  LATICACIDVEN 1.1  --     Estimated Creatinine Clearance: 55.9 mL/min (by C-G formula based on SCr of 1.06 mg/dL).    Allergies  Allergen Reactions  . Clindamycin Hcl Rash    Unknown reaction  Reaction: Unknown  Unknown reaction   . Sulfamethoxazole-Trimethoprim Rash    Antimicrobials this admission: Cefepime 2/4 x 1 Vancomycin 2/4 x 1 Ertapenem 2/4 >> Fluconazole 2/5 >>  Dose adjustments this admission:   Microbiology results:  BCx:   UCx:    Sputum:    MRSA PCR:   Thank you for allowing pharmacy to be a part of this patient's care.  Bhargav, Goyette A 11/02/2019 6:26 AM

## 2019-11-02 NOTE — Consult Note (Signed)
Urology Consult  I have been asked to see the patient by Dr. Reesa Chew, for evaluation and management of gross hematuria.  Chief Complaint: Gross hematuria  History of Present Illness: Chris Simon is a 86 y.o. year old male with PMH dementia, hypertension, A. fib, COPD, recent COVID-60 pneumonia admitted on 11/01/2019 with acute hypoxic respiratory failure and sepsis of unknown origin, likely urinary or pulmonary source with positive UA and CT findings of multifocal pneumonia.  He subsequently developed gross hematuria with passage of large clots and urology was consulted.  Urologic history significant for BPH on Flomax and finasteride; TRUS volume 234cc in 2015 on finasteride. He has a history of urinary retention several years ago that resolved with persistent incomplete bladder emptying at baseline. He developed acute urinary retention again during hospitalization at [facility] in December 2020 with COVID-19 pneumonia and ESBL E. coli UTI requiring placement of a Foley catheter and is due for voiding trial in our clinic. He has also had multiple episodes of gross hematuria recently, consistent with significant prostatomegaly.  Admission UA significant for budding yeast and pyuria.  On anti-infectives as below.  Foley catheter has been exchanged from a 58 [place] two-way to a 20 [place] three-way with pink urinary output this morning without visible clot material.  Holding Eliquis, urine cultures pending.  Blood culture pending with no growth at <24 hours.  Anti-infectives (From admission, onward)   Start     Dose/Rate Route Frequency Ordered Stop   11/02/19 1000  fluconazole (DIFLUCAN) IVPB 200 mg     200 mg 100 mL/hr over 60 Minutes Intravenous Every 24 hours 11/02/19 0625     11/01/19 2000  cefTRIAXone (ROCEPHIN) 1 g in sodium chloride 0.9 % 100 mL IVPB  Status:  Discontinued     1 g 200 mL/hr over 30 Minutes Intravenous Every 24 hours 11/01/19 1849 11/01/19 1859   11/01/19 2000  ertapenem  (INVANZ) 1,000 mg in sodium chloride 0.9 % 100 mL IVPB     1 g 200 mL/hr over 30 Minutes Intravenous Every 24 hours 11/01/19 1859     11/01/19 1430  vancomycin (VANCOCIN) IVPB 1000 mg/200 mL premix     1,000 mg 200 mL/hr over 60 Minutes Intravenous  Once 11/01/19 1424 11/01/19 1738   11/01/19 1100  ceFEPIme (MAXIPIME) 2 g in sodium chloride 0.9 % 100 mL IVPB     2 g 200 mL/hr over 30 Minutes Intravenous  Once 11/01/19 1057 11/01/19 1432      Past Medical History:  Diagnosis Date  . Aneurysm ([place])    Abdominal Aortic Aneurysm  . Atrial fibrillation ([place])   . Blockage of a bile duct   . BPH with obstruction/lower urinary tract symptoms   . Cellulitis of scrotum   . COPD (chronic obstructive pulmonary disease) ([place])   . Dementia ([place])   . Diverticulosis   . Dyspnea    with exertion  . Dysrhythmia   . Elevated PSA   . Epididymitis   . Gallstone   . Gross hematuria   . History of kidney stones   . Hyperlipidemia   . Kidney stones   . Nocturia   . Testicular pain   . TIA (transient ischemic attack)    June 2018  . Urinary retention     Past Surgical History:  Procedure Laterality Date  . CHOLECYSTECTOMY N/A 07/11/2015   Procedure: LAPAROSCOPIC CHOLECYSTECTOMY;  Surgeon: Rolm Bookbinder, MD;  Location: [place];  Service: General;  Laterality: N/A;  .  ERCP N/A 07/09/2015   Procedure: ENDOSCOPIC RETROGRADE CHOLANGIOPANCREATOGRAPHY (ERCP);  Surgeon: Clarene Essex, MD;  Location: [facility] ENDOSCOPY;  Service: Endoscopy;  Laterality: N/A;  . INGUINAL HERNIA REPAIR Left 04/07/2017   Procedure: HERNIA REPAIR INGUINAL ADULT;  Surgeon: Clayburn Pert, MD;  Location: [facility] ORS;  Service: General;  Laterality: Left;  . INSERTION OF MESH Left 04/07/2017   Procedure: INSERTION OF MESH;  Surgeon: Clayburn Pert, MD;  Location: [facility] ORS;  Service: General;  Laterality: Left;  . KYPHOPLASTY N/A 11/16/2018   Procedure: KYPHOPLASTY T12;  Surgeon: Hessie Knows, MD;  Location: [facility] ORS;  Service:  Orthopedics;  Laterality: N/A;  . LEFT HEART CATH AND CORONARY ANGIOGRAPHY N/A 01/10/2018   Procedure: LEFT HEART CATH AND CORONARY ANGIOGRAPHY;  Surgeon: Dionisio David, MD;  Location: [place] CV LAB;  Service: Cardiovascular;  Laterality: N/A;    Home Medications:  Current Meds  Medication Sig  . albuterol (VENTOLIN HFA) 108 (90 Base) MCG/ACT inhaler Inhale 1-2 puffs into the lungs every 4 (four) hours as needed for wheezing or shortness of breath.  Marland Kitchen amiodarone (PACERONE) 200 MG tablet Take 200 mg by mouth at bedtime.   Marland Kitchen apixaban (ELIQUIS) 5 MG TABS tablet Take 1 tablet (5 mg total) by mouth 2 (two) times daily.  . carvedilol (COREG) 6.25 MG tablet Take 6.25 mg by mouth 2 (two) times daily.  . Cyanocobalamin (B-12) 2500 MCG TABS Take 2,500 mg by mouth daily.  Marland Kitchen donepezil (ARICEPT) 10 MG tablet Take 10 mg by mouth at bedtime.  . enalapril (VASOTEC) 2.5 MG tablet Take 2.5 mg by mouth at bedtime.   . feeding supplement, ENSURE ENLIVE, (ENSURE ENLIVE) LIQD Take 237 mLs by mouth 2 (two) times daily between meals.  . finasteride (PROSCAR) 5 MG tablet Take 1 tablet (5 mg total) by mouth daily.  . furosemide (LASIX) 20 MG tablet Take 20 mg by mouth daily as needed for edema. >3lbs in 24 hours  . isosorbide mononitrate (IMDUR) 30 MG 24 hr tablet Take 30 mg by mouth at bedtime.   Marland Kitchen loratadine (CLARITIN) 10 MG tablet Take 10 mg by mouth daily as needed for allergies.  . memantine (NAMENDA) 10 MG tablet Take 10 mg by mouth 2 (two) times daily.   . mirtazapine (REMERON) 15 MG tablet Take 15 mg by mouth at bedtime.   . Multiple Vitamin (MULTIVITAMIN WITH MINERALS) TABS tablet Take 1 tablet by mouth daily.  . rosuvastatin (CRESTOR) 40 MG tablet Take 40 mg by mouth at bedtime.  . tamsulosin (FLOMAX) 0.4 MG CAPS capsule Take 1 capsule (0.4 mg total) by mouth daily.    Allergies:  Allergies  Allergen Reactions  . Clindamycin Hcl Rash    Unknown reaction  Reaction: Unknown  Unknown reaction    . Sulfamethoxazole-Trimethoprim Rash    Family History  Problem Relation Age of Onset  . Prostate cancer Brother   . Kidney disease Neg Hx   . Bladder Cancer Neg Hx     Social History:  reports that he has quit smoking. His smoking use included cigarettes. He smoked 0.50 packs per day. He has never used smokeless tobacco. He reports that he does not drink alcohol or use drugs.  ROS: A complete review of systems was performed.  All systems are negative except for pertinent findings as noted.  Physical Exam:  Vital signs in last 24 hours: Temp:  [97.8 F (36.6 C)-98.3 F (36.8 C)] 97.8 F (36.6 C) (02/05 0800) Pulse Rate:  [55-82] 70 (02/05  0800) Resp:  [15-22] 19 (02/05 0800) BP: (69-143)/(42-80) 97/44 (02/05 0800) SpO2:  [94 %-100 %] 100 % (02/05 0800) Weight:  [74.8 kg] 74.8 kg (02/04 1039) Constitutional:  Alert, no acute distress HEENT: [place] AT, moist mucus membranes. Cardiovascular: No cyanosis, or edema. Respiratory: Normal respiratory effort Skin: No rashes, bruises or suspicious lesions Neurologic: Grossly intact, no focal deficits, moving all 4 extremities Psychiatric: Normal mood and affect  Laboratory Data:  Recent Labs    11/01/19 1129 11/02/19 0302  WBC 10.6* 10.5  HGB 10.0* 10.1*  HCT 34.1* 34.2*   Recent Labs    11/01/19 1129 11/02/19 0302  NA 148* 146*  K 3.9 4.1  CL 112* 114*  CO2 28 27  GLUCOSE 101* 109*  BUN 21 19  CREATININE 1.32* 1.06  CALCIUM 7.9* 7.6*   Urinalysis    Component Value Date/Time   COLORURINE AMBER (A) 11/01/2019 1129   APPEARANCEUR HAZY (A) 11/01/2019 1129   APPEARANCEUR Cloudy (A) 07/05/2019 0857   LABSPEC 1.016 11/01/2019 1129   LABSPEC 1.019 06/21/2014 1800   PHURINE 6.0 11/01/2019 1129   GLUCOSEU NEGATIVE 11/01/2019 1129   GLUCOSEU Negative 06/21/2014 1800   HGBUR MODERATE (A) 11/01/2019 1129   BILIRUBINUR NEGATIVE 11/01/2019 1129   BILIRUBINUR Negative 07/05/2019 0857   BILIRUBINUR Negative 06/21/2014 1800     KETONESUR NEGATIVE 11/01/2019 1129   PROTEINUR 30 (A) 11/01/2019 1129   NITRITE NEGATIVE 11/01/2019 1129   LEUKOCYTESUR SMALL (A) 11/01/2019 1129   LEUKOCYTESUR Trace 06/21/2014 1800   Results for orders placed or performed during the hospital encounter of 11/01/19  Culture, blood (Routine x 2)     Status: None (Preliminary result)   Collection Time: 11/01/19 11:47 AM   Specimen: BLOOD  Result Value Ref Range Status   Specimen Description BLOOD RIGHT ANTECUBITAL  Final   Special Requests   Final    BOTTLES DRAWN AEROBIC AND ANAEROBIC Blood Culture results may not be optimal due to an excessive volume of blood received in culture bottles   Culture   Final    NO GROWTH < 24 HOURS Performed at [facility], [address] [place], [place] [address]    Report Status PENDING  Incomplete  Culture, blood (Routine x 2)     Status: None (Preliminary result)   Collection Time: 11/01/19  1:28 PM   Specimen: BLOOD  Result Value Ref Range Status   Specimen Description BLOOD LEFT ANTECUBITAL  Final   Special Requests   Final    BOTTLES DRAWN AEROBIC AND ANAEROBIC Blood Culture adequate volume   Culture   Final    NO GROWTH < 24 HOURS Performed at [facility], [address] [place], [place] [address]    Report Status PENDING  Incomplete  Respiratory Panel by RT PCR (Flu A&B, Covid) - Nasopharyngeal Swab     Status: Abnormal   Collection Time: 11/01/19  3:27 PM   Specimen: Nasopharyngeal Swab  Result Value Ref Range Status   SARS Coronavirus 2 by RT PCR POSITIVE (A) NEGATIVE Final    Comment: RESULT CALLED TO, READ BACK BY AND VERIFIED WITH: TUMEY AT 1730 ON 11/01/2019 BY MOSLEY,J (NOTE) SARS-CoV-2 target nucleic acids are DETECTED. SARS-CoV-2 RNA is generally detectable in upper respiratory specimens  during the acute phase of infection. Positive results are indicative of the presence of the identified virus, but do not rule out bacterial infection or co-infection  with other pathogens not detected by the test. Clinical correlation with patient history  and other diagnostic information is necessary to determine patient infection status. The expected result is Negative. Fact Sheet for Patients:  PinkCheek.be Fact Sheet for Healthcare Providers: GravelBags.it This test is not yet approved or cleared by the [place] FDA and  has been authorized for detection and/or diagnosis of SARS-CoV-2 by FDA under an Emergency Use Authorization (EUA).  This EUA will remain in effect (meaning this test can be used)  for the duration of  the COVID-19 declaration under Section 564(b)(1) of the Act, 21 U.S.C. section 360bbb-3(b)(1), unless the authorization is terminated or revoked sooner.    Influenza A by PCR NEGATIVE NEGATIVE Final   Influenza B by PCR NEGATIVE NEGATIVE Final    Comment: (NOTE) The Xpert Xpress SARS-CoV-2/FLU/RSV assay is intended as an aid in  the diagnosis of influenza from Nasopharyngeal swab specimens and  should not be used as a sole basis for treatment. Nasal washings and  aspirates are unacceptable for Xpert Xpress SARS-CoV-2/FLU/RSV  testing. Fact Sheet for Patients: PinkCheek.be Fact Sheet for Healthcare Providers: GravelBags.it This test is not yet approved or cleared by the [place] FDA and  has been authorized for detection and/or diagnosis of SARS-CoV-2 by  FDA under an Emergency Use Authorization (EUA). This EUA will remain  in effect (meaning this test can be used) for the duration of the  Covid-19 declaration under Section 564(b)(1) of the Act, 21  U.S.C. section 360bbb-3(b)(1), unless the authorization is  terminated or revoked. Performed at [facility], [address] [place], [place] [address]    Assessment & Plan:  86 year old comorbid male with multiple recent hospitalizations  associated with COVID-19 infection, also with recurrent urinary retention in the setting of BPH with significant prostatomegaly, admitted with acute hypoxic respiratory failure and sepsis of urinary versus pulmonary origin, now with gross hematuria.  Hematuria likely secondary to massive BPH versus recent Foley replacement versus acute cystitis.  Resolving on its own with held anticoagulation.  I elected to irrigate the patient's Foley catheter this morning.  I prepped the patient and instilled approximately 300 mL of sterile water in his 20 [place] three-way Foley catheter with return of approximately 300 mL of clear to light pink output.  No clot material evacuated; catheter draining well.  Recommendations: -Hold Eliquis -Continue empiric antibiotics and fluconazole for positive UA and narrow therapy per urine culture results -Outpatient voiding trial in 2 weeks  Thank you for involving me in this patient's care, will continue to follow.  Debroah Loop, PA-C 11/02/2019 8:53 AM

## 2019-11-02 NOTE — Progress Notes (Signed)
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

## 2019-11-02 NOTE — Progress Notes (Signed)
*  PRELIMINARY RESULTS* Echocardiogram 2D Echocardiogram has been performed.  Wallie Char Retia Cordle 11/02/2019, 10:44 AM

## 2019-11-02 NOTE — Consult Note (Signed)
NAME: Chris Simon  DOB: [birth year]  MRN: [id number]  Date/Time: 11/02/2019 11:04 AM  REQUESTING PROVIDER: Jamse Arn Subjective:  REASON FOR CONSULT: sepsis ? [name] is a 86 y.o. male with h/o dementia BPH with urinary retention needing chronic foley , HTn, Afib, COPD was sent to the ED from urology clinic yesterday because of hypotension. Pt had gone to the clinic for  voiding trial as he had a foley since Dec 2020. As he had low BP in the clinic he was sent to the ED PT has had frequent hospitalizations since Dec 2020. He initially was hospitalized between 12/20-12/27/20 at [facility] for altered mental status and found to have COVID and also ESBL e.coli UTI in the setting of BPH with intermittent urinary retention needing foley catheter. HE was treated with Iv ertapenem for 7 days. He had mild to moderate covid and did not receive any remdisivr due to elevated transaminases but got dexamethasone. He had some urinary retention and had I/O catheter and developed hematuria with clots and had foley placed and was discharged on 12/27 only to return the same day after having a fall at home  His second stay was  between 09/23/19-10/16/18 for fall, encephalopathy, enterococcus faecium  bacteremia /UTI,( resistant to ampicillin) and hematuria intermittently during the stay. He failed voiding trial so he was sent to NH with foley. He came to [facility] on 1/25 after he had removed the foley catheter at [employer]. He had b/l infiltrates post covid and was given 5 days of antibiotic. He had another foley placed and was discharged to Nursing home on 10/25/19. He s back again to [facility] as he had low BP at the urology office and was also feeling weak. As per his daughter he had been too weak and could not stand or walk. No fever or chills, or cough or abdominal pain. In the ED vitals BP of 69/56, HR 78, Pulse ox 95% and Temp98.3 Labs revealed a WBC of 10.6, Hb 10, cr of 1.32, UA > 50 WBC . Urine and blood  cuture sent and he was started on meropenem I am asked to see the patient for the same Meanwhile early this morning blood was noted from the foley catheter and it got clogged with clots and foley was replaced by 52 F by  NP . Seen by urology this morning who noted clear urine. When I went to see the patient around noon he was somnolent and blood was coming out of his penis.    Past Medical History:  Diagnosis Date   Aneurysm ([employer])    Abdominal Aortic Aneurysm   Atrial fibrillation (HCC)    Blockage of a bile duct    BPH with obstruction/lower urinary tract symptoms    Cellulitis of scrotum    COPD (chronic obstructive pulmonary disease) (HCC)    Dementia (HCC)    Diverticulosis    Dyspnea    with exertion   Dysrhythmia    Elevated PSA    Epididymitis    Gallstone    Gross hematuria    History of kidney stones    Hyperlipidemia    Kidney stones    Nocturia    Testicular pain    TIA (transient ischemic attack)    June 2018   Urinary retention     Past Surgical History:  Procedure Laterality Date   CHOLECYSTECTOMY N/A 07/11/2015   Procedure: LAPAROSCOPIC CHOLECYSTECTOMY;  Surgeon: Rolm Bookbinder, MD;  Location: [place];  Service: General;  Laterality:  N/A;   ERCP N/A 07/09/2015   Procedure: ENDOSCOPIC RETROGRADE CHOLANGIOPANCREATOGRAPHY (ERCP);  Surgeon: Clarene Essex, MD;  Location: [facility] ENDOSCOPY;  Service: Endoscopy;  Laterality: N/A;   INGUINAL HERNIA REPAIR Left 04/07/2017   Procedure: HERNIA REPAIR INGUINAL ADULT;  Surgeon: Clayburn Pert, MD;  Location: [facility] ORS;  Service: General;  Laterality: Left;   INSERTION OF MESH Left 04/07/2017   Procedure: INSERTION OF MESH;  Surgeon: Clayburn Pert, MD;  Location: [facility] OR[facility];  Service: General;  Laterality: Left;   KYPHOPLASTY N/A 11/16/2018   Procedure: KYPHOPLASTY T12;  Surgeon: Hessie Knows, MD;  Location: [facility] ORS;  Service: Orthopedics;  Laterality: N/A;   LEFT HEART CATH AND CORONARY ANGIOGRAPHY  N/A 01/10/2018   Procedure: LEFT HEART CATH AND CORONARY ANGIOGRAPHY;  Surgeon: Dionisio David, MD;  Location: [place] [facility];  Service: Cardiovascular;  Laterality: N/A;    Social History   Socioeconomic History   Marital status: Widowed    Spouse name: Not on file   Number of children: Not on file   Years of education: Not on file   Highest education level: Not on file  Occupational History   Not on file  Tobacco Use   Smoking status: Former Smoker    Packs/day: 0.50    Types: Cigarettes   Smokeless tobacco: Never Used   Tobacco comment: quit 40 years  Substance and Sexual Activity   Alcohol use: No    Alcohol/week: 0.0 standard drinks   Drug use: No   Sexual activity: Never  Other Topics Concern   Not on file  Social History Narrative   Not on file   Social Determinants of Health   Financial Resource Strain:    Difficulty of Paying Living Expenses: Not on file  Food Insecurity:    Worried About [place] in the Last Year: Not on file   Ran Out of Food in the Last Year: Not on file  Transportation Needs:    Lack of Transportation (Medical): Not on file   Lack of Transportation (Non-Medical): Not on file  Physical Activity:    Days of Exercise per Week: Not on file   Minutes of Exercise per Session: Not on file  Stress:    Feeling of Stress : Not on file  Social Connections:    Frequency of Communication with Friends and Family: Not on file   Frequency of Social Gatherings with Friends and Family: Not on file   Attends Religious Services: Not on file   Active Member of Clubs or Organizations: Not on file   Attends Archivist Meetings: Not on file   Marital Status: Not on file  Intimate Partner Violence:    Fear of Current or Ex-Partner: Not on file   Emotionally Abused: Not on file   Physically Abused: Not on file   Sexually Abused: Not on file    Family History  Problem Relation Age of Onset    Prostate cancer Brother    Kidney disease Neg Hx    Bladder Cancer Neg Hx    Allergies  Allergen Reactions   Clindamycin Hcl Rash    Unknown reaction  Reaction: Unknown  Unknown reaction    Sulfamethoxazole-Trimethoprim Rash    ? Current Facility-Administered Medications  Medication Dose Route Frequency Provider Last Rate Last Admin   amiodarone (PACERONE) tablet 200 mg  200 mg Oral QHS Bonnell Public Tublu, MD   200 mg at 11/01/19 2334   apixaban (ELIQUIS) tablet 5 mg  5  mg Oral BID Bonnell Public Tublu, MD   5 mg at 11/02/19 [id number]   bisacodyl (DULCOLAX) suppository 10 mg  10 mg Rectal Daily PRN Vashti Hey, MD       Chlorhexidine Gluconate Cloth 2 % PADS 6 each  6 each Topical Daily Lorella Nimrod, MD       dexamethasone (DECADRON) tablet 6 mg  6 mg Oral q1800 Bonnell Public Tublu, MD   6 mg at 11/01/19 2341   ertapenem [facility]) 1,000 mg in sodium chloride 0.9 % 100 mL IVPB  1 g Intravenous Q24H Vashti Hey, MD   Stopped at 11/01/19 2153   feeding supplement (ENSURE ENLIVE) (ENSURE ENLIVE) liquid 237 mL  237 mL Oral BID BM Bonnell Public Tublu, MD   237 mL at 11/02/19 0917   finasteride (PROSCAR) tablet 5 mg  5 mg Oral Daily Bonnell Public Tublu, MD   5 mg at 11/02/19 0917   fluconazole (DIFLUCAN) IVPB 200 mg  200 mg Intravenous Q24H Mervyn, Vansomeren A, RPH 100 mL/hr at 11/02/19 1020 200 mg at 11/02/19 1020   lactated ringers infusion   Intravenous Continuous Vashti Hey, MD 125 mL/hr at 11/01/19 1924 New Bag at 11/01/19 1924   memantine (NAMENDA) tablet 10 mg  10 mg Oral BID Bonnell Public Tublu, MD   10 mg at 11/02/19 [id number]   mirtazapine (REMERON) tablet 15 mg  15 mg Oral QHS Bonnell Public Tublu, MD   15 mg at 11/01/19 2334   polyethylene glycol (MIRALAX / GLYCOLAX) packet 17 g  17 g Oral Daily PRN Vashti Hey, MD       rosuvastatin (CRESTOR) tablet 40 mg  40 mg Oral q1800  Bonnell Public Tublu, MD   40 mg at 11/01/19 2347   tamsulosin (FLOMAX) capsule 0.4 mg  0.4 mg Oral Daily Bonnell Public Tublu, MD   0.4 mg at 11/02/19 [id number]     Abtx:  Anti-infectives (From admission, onward)   Start     Dose/Rate Route Frequency Ordered Stop   11/02/19 1000  fluconazole (DIFLUCAN) IVPB 200 mg     200 mg 100 mL/hr over 60 Minutes Intravenous Every 24 hours 11/02/19 0625     11/01/19 2000  cefTRIAXone (ROCEPHIN) 1 g in sodium chloride 0.9 % 100 mL IVPB  Status:  Discontinued     1 g 200 mL/hr over 30 Minutes Intravenous Every 24 hours 11/01/19 1849 11/01/19 1859   11/01/19 2000  ertapenem (INVANZ) 1,000 mg in sodium chloride 0.9 % 100 mL IVPB     1 g 200 mL/hr over 30 Minutes Intravenous Every 24 hours 11/01/19 1859     11/01/19 1430  vancomycin (VANCOCIN) IVPB 1000 mg/200 mL premix     1,000 mg 200 mL/hr over 60 Minutes Intravenous  Once 11/01/19 1424 11/01/19 1738   11/01/19 1100  ceFEPIme (MAXIPIME) 2 g in sodium chloride 0.9 % 100 mL IVPB     2 g 200 mL/hr over 30 Minutes Intravenous  Once 11/01/19 1057 11/01/19 1432      REVIEW OF SYSTEMS:  NA ?  Objective:  VITALS:  BP (!) 133/105 (BP Location: Right Arm)    Pulse 70    Temp 98.6 F (37 C) (Axillary)    Resp 15    Ht 6' (1.829 m)    Wt 74.8 kg    SpO2 100%    BMI 22.38 kg/m  PHYSICAL EXAM:  General: obtunded Head: Normocephalic, without obvious abnormality, atraumatic. Eyes:did not examine ENT -  unable to examine Neck:  symmetrical, no adenopathy, thyroid: non tender no carotid bruit and no JVD. Back: could not examine Lungs:b/l air entry Heart: irregular Abdomen: Soft, b/l inguinal hernia Blood oozing from urethra Extremities: atraumatic, no cyanosis. No edema. No clubbing Skin: No rashes or lesions. Or bruising Lymph: Cervical, supraclavicular normal. Neurologic: cannot be examined Pertinent Labs Lab Results CBC    Component Value Date/Time   WBC 10.5 11/02/2019 0302   RBC 3.37  (L) 11/02/2019 0302   HGB 10.1 (L) 11/02/2019 0302   HGB 12.3 (L) 06/23/2014 0421   HCT 34.2 (L) 11/02/2019 0302   HCT 38.4 (L) 06/23/2014 0421   PLT 250 11/02/2019 0302   PLT 136 (L) 06/23/2014 0421   MCV 101.5 (H) 11/02/2019 0302   MCV 96 06/23/2014 0421   MCH 30.0 11/02/2019 0302   MCHC 29.5 (L) 11/02/2019 0302   RDW 14.8 11/02/2019 0302   RDW 13.7 06/23/2014 0421   LYMPHSABS 0.7 11/02/2019 0302   LYMPHSABS 0.7 (L) 06/23/2014 0421   MONOABS 0.2 11/02/2019 0302   MONOABS 1.0 06/23/2014 0421   EOSABS 0.3 11/02/2019 0302   EOSABS 0.1 06/23/2014 0421   BASOSABS 0.1 11/02/2019 0302   BASOSABS 0.0 06/23/2014 0421    CMP Latest Ref Rng & Units 11/02/2019 11/01/2019 10/24/2019  Glucose 70 - 99 mg/dL 109(H) 101(H) 98  BUN 8 - 23 mg/dL 19 21 19   Creatinine 0.61 - 1.24 mg/dL 1.06 1.32(H) 1.12  Sodium 135 - 145 mmol/L 146(H) 148(H) 144  Potassium 3.5 - 5.1 mmol/L 4.1 3.9 3.4(L)  Chloride 98 - 111 mmol/L 114(H) 112(H) 111  CO2 22 - 32 mmol/L 27 28 30   Calcium 8.9 - 10.3 mg/dL 7.6(L) 7.9(L) 7.4(L)  Total Protein 6.5 - 8.1 g/dL 5.6(L) 5.8(L) -  Total Bilirubin 0.3 - 1.2 mg/dL 0.8 1.4(H) -  Alkaline Phos 38 - 126 U/L 83 89 -  AST 15 - 41 U/L 26 25 -  ALT 0 - 44 U/L 22 20 -      Microbiology: Recent Results (from the past 240 hour(s))  Culture, blood (Routine x 2)     Status: None (Preliminary result)   Collection Time: 11/01/19 11:47 AM   Specimen: BLOOD  Result Value Ref Range Status   Specimen Description BLOOD RIGHT ANTECUBITAL  Final   Special Requests   Final    BOTTLES DRAWN AEROBIC AND ANAEROBIC Blood Culture results may not be optimal due to an excessive volume of blood received in culture bottles   Culture   Final    NO GROWTH < 24 HOURS Performed at [facility], [address], [place], [place] [address]    Report Status PENDING  Incomplete  Culture, blood (Routine x 2)     Status: None (Preliminary result)   Collection Time: 11/01/19  1:28 PM   Specimen:  BLOOD  Result Value Ref Range Status   Specimen Description BLOOD LEFT ANTECUBITAL  Final   Special Requests   Final    BOTTLES DRAWN AEROBIC AND ANAEROBIC Blood Culture adequate volume   Culture   Final    NO GROWTH < 24 HOURS Performed at [facility], [address] [place], [place] [address]    Report Status PENDING  Incomplete  Respiratory Panel by RT PCR (Flu A&B, Covid) - Nasopharyngeal Swab     Status: Abnormal   Collection Time: 11/01/19  3:27 PM   Specimen: Nasopharyngeal Swab  Result Value Ref Range Status   SARS Coronavirus 2  by RT PCR POSITIVE (A) NEGATIVE Final    Comment: RESULT CALLED TO, READ BACK BY AND VERIFIED WITH: TUMEY AT 1730 ON 11/01/2019 BY MOSLEY,J (NOTE) SARS-CoV-2 target nucleic acids are DETECTED. SARS-CoV-2 RNA is generally detectable in upper respiratory specimens  during the acute phase of infection. Positive results are indicative of the presence of the identified virus, but do not rule out bacterial infection or co-infection with other pathogens not detected by the test. Clinical correlation with patient history and other diagnostic information is necessary to determine patient infection status. The expected result is Negative. Fact Sheet for Patients:  PinkCheek.be Fact Sheet for Healthcare Providers: GravelBags.it This test is not yet approved or cleared by the [place] FDA and  has been authorized for detection and/or diagnosis of SARS-CoV-2 by FDA under an Emergency Use Authorization (EUA).  This EUA will remain in effect (meaning this test can be used)  for the duration of  the COVID-19 declaration under Section 564(b)(1) of the Act, 21 U.S.C. section 360bbb-3(b)(1), unless the authorization is terminated or revoked sooner.    Influenza A by PCR NEGATIVE NEGATIVE Final   Influenza B by PCR NEGATIVE NEGATIVE Final    Comment: (NOTE) The Xpert Xpress SARS-CoV-2/FLU/RSV  assay is intended as an aid in  the diagnosis of influenza from Nasopharyngeal swab specimens and  should not be used as a sole basis for treatment. Nasal washings and  aspirates are unacceptable for Xpert Xpress SARS-CoV-2/FLU/RSV  testing. Fact Sheet for Patients: PinkCheek.be Fact Sheet for Healthcare Providers: GravelBags.it This test is not yet approved or cleared by the [place] FDA and  has been authorized for detection and/or diagnosis of SARS-CoV-2 by  FDA under an Emergency Use Authorization (EUA). This EUA will remain  in effect (meaning this test can be used) for the duration of the  Covid-19 declaration under Section 564(b)(1) of the Act, 21  U.S.C. section 360bbb-3(b)(1), unless the authorization is  terminated or revoked. Performed at [facility], [address] [place], [place] [address]     IMAGING RESULTS:   I have personally reviewed the films ? Impression/Recommendation ? Hypotension present on admission likely due to dehydration/poor intake , could be meds Less likely sepsis- UC neg  COVID in Dec 202- He has high ct value > 40 and hence airborne isolation not needed Also on high dose steroids which may not be needed now- ( he has bene given before) `  Hematuria - recurrent ,l Underlying BPH , has chronic foley since Dec- IS this traumatic due to him manipulating it ?  ESBL e.coli in the past but urine culture from 11/01/19 NG- so can DC ertapenem and observe ( he received a dsoe at 8pm) Dc eliquis as recommended by urology for 3 days  Yeast in the urine is colonizing the foley, urine- treatment not needed. already has received a dose of 200mg    Afib- on amiodarone    Dementia- on memantine, mirtazapine  BPH on finasteride, tamsulosin ? ___________________________________________________ Discussed with hospitalist ID will follow him remotely this wekeend

## 2019-11-02 NOTE — Progress Notes (Signed)
Pt BP 97/44. MD made aware. 568ml bolus ordered. Bolus given.  Will continue to monitor.

## 2019-11-02 NOTE — Progress Notes (Signed)
PROGRESS NOTE    Chris Simon  [id number] DOB: Jun 06, 1934 DOA: 11/01/2019 PCP: [name], MD   Brief Narrative:  Chris Simon is an 86 y.o. male past medical history significant for dementia, BPH with urinary retention requiring chronic Foley catheter, HTN, A. fib, COPD who was brought in from urology clinic for hypotension and weakness. On arrival patient was hypotensive with blood pressure of 69/76, tachypneic tachycardic, meeting sepsis criteria and resuscitated with IV fluids and started on broad-spectrum antibiotics.  Procalcitonin was negative.  Patient has chronic Foley with recent infection with E. coli ESBL. He was also recently treated for Covid pneumonia in December 2020 at [facility] and discharged to rehab after prolonged hospitalization.  Another admission with recurrent hypoxic respiratory failure and multifocal pneumonia, he was treated with antibiotics although procalcitonin was negative, improved and return back to [facility].  Subjective: Patient appears very lethargic and confused.  Unable to answer any questions. Per nursing staff remained intermittently agitated.  Assessment & Plan:   Principal Problem:   Sepsis ([place]) Active Problems:   Lower urinary tract infectious disease   Atrial fibrillation (HCC)   Bladder retention   Acute respiratory failure with hypoxia (HCC)   Chronic indwelling Foley catheter   Alzheimer's dementia without behavioral disturbance (HCC)   Pneumonia due to COVID-19 virus   COPD (chronic obstructive pulmonary disease) ([place])  Sepsis/encephalopathy.  Most likely secondary to UTI.  Urine looks infected.  Patient has a chronic Foley catheter.  Chest x-ray with bilateral infiltrate and a small pneumo and hydrothorax, most likely post Covid pneumonitis. He was given IV fluid resulted in improvement in his blood pressure. -ID was consulted-appreciate their recommendations. -Continue with ertapenem-we will de-escalate once more  culture data becomes available. -Patient appears very encephalopathic, has very poor prognosis at this time. -Palliative care consult.  Gross hematuria.  Appears traumatic.  Patient had clogged Foley with blood clots overnight, changed Foley x2 since admission. Urology was consulted-advising irrigation and holding Eliquis. -We will see him as an outpatient in 2 weeks for voiding trial. -Patient with chronic Foley catheter and advanced dementia-might need a suprapubic catheter provide urethral trauma. -Monitor hemoglobin  Multifocal pneumonia/pulmonary infiltrate with small pneumo and hydrothorax.  Most likely post Covid pneumonitis. -Echocardiogram was within normal limits. -Continue with Decadron 6 mg IV daily.  Hypertension.  Currently softer blood pressure. -Holding home antihypertensives-can be restarted as needed.  Dementia.  Patient has advanced dementia which is also contributing to his encephalopathy. -Continue home dose of donepezil, memantine and Remeron.  COPD. Continue home regimen of inhaled bronchodilators Small tiny pneumothoraces most likely popped blebs from his emphysema.  Atrial fibrillation.  Currently rate controlled. -Continue with amiodarone. -Holding carvedilol. -Hold Eliquis for few days due to gross hematuria.  BPH.  Continue home dose of finasteride and tamsulosin.  End-of-life discussion.  Done by Dr. Jamse Arn.  See her notes below. Spoke at length with patient's daughter about patient's prognosis. She notes that she just lost her husband to Covid pneumonia. She states she would not like her father to ever be intubated. However she would otherwise like resuscitation protocol. I stated that it would be highly unlikely for anyone to survive resuscitation without intubation and also that his likelihood of successful resuscitation would be extremely low. Daughter would like for patient to have a limited code with DNI but other resuscitation procedures to  be followed including CPR. Patient has a poor prognosis due to advanced dementia and multiple comorbidities with recent Covid  infection and current severe encephalopathy. -I will involve palliative for further discussion.   Objective: Vitals:   11/02/19 1245 11/02/19 1300 11/02/19 1315 11/02/19 1330  BP:      Pulse: (!) 55 (!) 56 (!) 54 63  Resp: 17 19 16 16   Temp:      TempSrc:      SpO2: 99% 99% 98% 99%  Weight:      Height:        Intake/Output Summary (Last 24 hours) at 11/02/2019 1353 Last data filed at 11/02/2019 [id number] Gross per 24 hour  Intake 2650 ml  Output 1400 ml  Net 1250 ml   Filed Weights   11/01/19 1039  Weight: 74.8 kg    Examination:  General exam: Chronically ill-appearing, frail elderly man, appears little agitated. Respiratory system: Clear to auscultation. Respiratory effort normal. Cardiovascular system: S1 & S2 heard, RRR. No JVD, murmurs, rubs, gallops or clicks. Gastrointestinal system: Soft, nontender, nondistended, bowel sounds positive. Central nervous system: Awake but very confused.  Not following any commands. Extremities: No edema, no cyanosis, pulses intact and symmetrical.  Psychiatry: Judgement and insight appear severely impaired.   DVT prophylaxis: SCDs Code Status: Partial/DNI Family Communication: No family at bedside. Disposition Plan: Pending improvement.  Patient is very sick with bad prognosis. High risk for deterioration and death.  Consultants:   Urology  ID  Procedures:  Antimicrobials:  Ertapenem  Data Reviewed: I have personally reviewed following labs and imaging studies  CBC: Recent Labs  Lab 11/01/19 1129 11/02/19 0302  WBC 10.6* 10.5  NEUTROABS 7.6 9.2*  HGB 10.0* 10.1*  HCT 34.1* 34.2*  MCV 102.1* 101.5*  PLT 291 [id number]   Basic Metabolic Panel: Recent Labs  Lab 11/01/19 1129 11/02/19 0302  NA 148* 146*  K 3.9 4.1  CL 112* 114*  CO2 28 27  GLUCOSE 101* 109*  BUN 21 19  CREATININE 1.32* 1.06    CALCIUM 7.9* 7.6*   GFR: Estimated Creatinine Clearance: 55.9 mL/min (by C-G formula based on SCr of 1.06 mg/dL). Liver Function Tests: Recent Labs  Lab 11/01/19 1129 11/02/19 0302  AST 25 26  ALT 20 22  ALKPHOS 89 83  BILITOT 1.4* 0.8  PROT 5.8* 5.6*  ALBUMIN 1.8* 1.8*   No results for input(s): LIPASE, AMYLASE in the last 168 hours. No results for input(s): AMMONIA in the last 168 hours. Coagulation Profile: No results for input(s): INR, PROTIME in the last 168 hours. Cardiac Enzymes: No results for input(s): CKTOTAL, CKMB, CKMBINDEX, TROPONINI in the last 168 hours. BNP (last 3 results) No results for input(s): PROBNP in the last 8760 hours. HbA1C: No results for input(s): HGBA1C in the last 72 hours. CBG: No results for input(s): GLUCAP in the last 168 hours. Lipid Profile: No results for input(s): CHOL, HDL, LDLCALC, TRIG, CHOLHDL, LDLDIRECT in the last 72 hours. Thyroid Function Tests: No results for input(s): TSH, T4TOTAL, FREET4, T3FREE, THYROIDAB in the last 72 hours. Anemia Panel: No results for input(s): VITAMINB12, FOLATE, FERRITIN, TIBC, IRON, RETICCTPCT in the last 72 hours. Sepsis Labs: Recent Labs  Lab 11/01/19 1129  PROCALCITON <0.10  LATICACIDVEN 1.1    Recent Results (from the past 240 hour(s))  Urine culture     Status: Abnormal   Collection Time: 11/01/19 11:29 AM   Specimen: Urine, Random  Result Value Ref Range Status   Specimen Description   Final    URINE, RANDOM Performed at [facility], [address] [place], [place] [address]  Special Requests   Final    NONE Performed at [facility], [place] [place], [place] [address]    Culture (A)  Final    <10,000 COLONIES/mL INSIGNIFICANT GROWTH Performed at [facility] [address], [place], [place] [address]    Report Status 11/02/2019 FINAL  Final  Culture, blood (Routine x 2)     Status: None (Preliminary result)   Collection Time: 11/01/19  11:47 AM   Specimen: BLOOD  Result Value Ref Range Status   Specimen Description BLOOD RIGHT ANTECUBITAL  Final   Special Requests   Final    BOTTLES DRAWN AEROBIC AND ANAEROBIC Blood Culture results may not be optimal due to an excessive volume of blood received in culture bottles   Culture   Final    NO GROWTH < 24 HOURS Performed at [facility], [address], [place], [place] [address]    Report Status PENDING  Incomplete  Culture, blood (Routine x 2)     Status: None (Preliminary result)   Collection Time: 11/01/19  1:28 PM   Specimen: BLOOD  Result Value Ref Range Status   Specimen Description BLOOD LEFT ANTECUBITAL  Final   Special Requests   Final    BOTTLES DRAWN AEROBIC AND ANAEROBIC Blood Culture adequate volume   Culture   Final    NO GROWTH < 24 HOURS Performed at [facility], [address] [place], [place] [address]    Report Status PENDING  Incomplete  Respiratory Panel by RT PCR (Flu A&B, Covid) - Nasopharyngeal Swab     Status: Abnormal   Collection Time: 11/01/19  3:27 PM   Specimen: Nasopharyngeal Swab  Result Value Ref Range Status   SARS Coronavirus 2 by RT PCR POSITIVE (A) NEGATIVE Final    Comment: RESULT CALLED TO, READ BACK BY AND VERIFIED WITH: TUMEY AT 1730 ON 11/01/2019 BY MOSLEY,J (NOTE) SARS-CoV-2 target nucleic acids are DETECTED. SARS-CoV-2 RNA is generally detectable in upper respiratory specimens  during the acute phase of infection. Positive results are indicative of the presence of the identified virus, but do not rule out bacterial infection or co-infection with other pathogens not detected by the test. Clinical correlation with patient history and other diagnostic information is necessary to determine patient infection status. The expected result is Negative. Fact Sheet for Patients:  PinkCheek.be Fact Sheet for Healthcare Providers: GravelBags.it This test  is not yet approved or cleared by the [place] FDA and  has been authorized for detection and/or diagnosis of SARS-CoV-2 by FDA under an Emergency Use Authorization (EUA).  This EUA will remain in effect (meaning this test can be used)  for the duration of  the COVID-19 declaration under Section 564(b)(1) of the Act, 21 U.S.C. section 360bbb-3(b)(1), unless the authorization is terminated or revoked sooner.    Influenza A by PCR NEGATIVE NEGATIVE Final   Influenza B by PCR NEGATIVE NEGATIVE Final    Comment: (NOTE) The Xpert Xpress SARS-CoV-2/FLU/RSV assay is intended as an aid in  the diagnosis of influenza from Nasopharyngeal swab specimens and  should not be used as a sole basis for treatment. Nasal washings and  aspirates are unacceptable for Xpert Xpress SARS-CoV-2/FLU/RSV  testing. Fact Sheet for Patients: PinkCheek.be Fact Sheet for Healthcare Providers: GravelBags.it This test is not yet approved or cleared by the [place] FDA and  has been authorized for detection and/or diagnosis of SARS-CoV-2 by  FDA under an Emergency Use Authorization (EUA). This EUA  will remain  in effect (meaning this test can be used) for the duration of the  Covid-19 declaration under Section 564(b)(1) of the Act, 21  U.S.C. section 360bbb-3(b)(1), unless the authorization is  terminated or revoked. Performed at [facility], [address], [place], [place] [address]      Radiology Studies: DG Chest 1 View  Result Date: 11/01/2019 CLINICAL DATA:  Hypotension today. History of COPD. EXAM: CHEST  1 VIEW COMPARISON:  Single-view of the chest 10/22/2019 and 02/19/2017. FINDINGS: Extensive multifocal airspace disease seen on the comparison examination persists. There is a new small left pneumothorax estimated at 15-20%. The right lung is expanded. Heart size is normal. No acute or focal bony abnormality. IMPRESSION: 1. New  small left pneumothorax estimated at 15-20%. 2. No change in extensive bilateral airspace disease. 3. Critical Value/emergent results were called by telephone at the time of interpretation on 11/01/2019 at 12:25 pm to provider PHILLIP STAFFORD , who verbally acknowledged these results. Electronically Signed   By: Inge Rise M.D.   On: 11/01/2019 12:27   CT Chest Wo Contrast  Result Date: 11/01/2019 CLINICAL DATA:  Hypotension, pneumonia, abnormal chest x-ray EXAM: CT CHEST WITHOUT CONTRAST TECHNIQUE: Multidetector CT imaging of the chest was performed following the standard protocol without IV contrast. COMPARISON:  Chest x-ray 11/01/2019, 10/22/2019; CT 09/17/2013 FINDINGS: Cardiovascular: Heart size is normal. No pericardial effusion. Thoracic aorta is nonaneurysmal. Extensive atherosclerotic calcifications of the aorta and coronary arteries. Mediastinum/Nodes: Enlarged thyroid gland extending into the superior mediastinum, similar to prior CT. Trachea and esophagus within normal limits. No axillary, mediastinal, or hilar lymphadenopathy. Lungs/Pleura: Small left-sided pneumothorax involving approximately 5% of the lung volume with left apical and small anterior components including associated layering pleural fluid compatible with hydropneumothorax. There is a tiny right lateral pneumothorax (series 3, image 60) also with loculated pleural fluid. Moderate left and small right pleural effusions. Extensive multifocal airspace consolidations scattered throughout both lungs compatible with multifocal pneumonia there is mild-to-moderate centrilobular and paraseptal emphysema. Scattered small pulmonary nodules bilaterally. Upper Abdomen: No acute findings within the visualized portion of the upper abdomen. Colonic diverticulosis. Small amount of pneumobilia, presumably related to post cholecystectomy changes. Musculoskeletal: Prior cement augmentation related to vertebral body compression fracture at T12. No  acute osseous abnormality or chest wall lesion. IMPRESSION: 1. Small left hydropneumothorax involving approximately 5% of the left lung volume. 2. Tiny right-sided hydropneumothorax involving less than 5% of the right lung volume. 3. Extensive multifocal airspace consolidations scattered throughout both lungs compatible with multifocal pneumonia. Radiographic follow-up to resolution is recommended to exclude underlying malignancy. 4. Moderate left and small right pleural effusions. 5. Enlarged thyroid gland extending into the superior mediastinum, similar to prior CT. Aortic Atherosclerosis (ICD10-I70.0) and Emphysema (ICD10-J43.9). These results were called by telephone at the time of interpretation on 11/01/2019 at 2:22 pm to provider PHILLIP STAFFORD , who verbally acknowledged these results. Electronically Signed   By: Davina Poke D.O.   On: 11/01/2019 14:24   ECHOCARDIOGRAM COMPLETE  Result Date: 11/02/2019   ECHOCARDIOGRAM REPORT   Patient Name:   MUHAMMED STRUB Date of Exam: 11/02/2019 Medical Rec #:  [id number]         Height:       72.0 in Accession #:    [id number]        Weight:       165.0 lb Date of Birth:  [birth year]         BSA:  1.96 m Patient Age:    16 years          BP:           97/44 mmHg Patient Gender: M                 HR:           63 bpm. Exam Location:  [facility] Procedure: 2D Echo, Color Doppler and Cardiac Doppler Indications:     I50.31 CHF-Acute Diastolic  History:         Patient has prior history of Echocardiogram examinations. COPD,                  Arrythmias:Atrial Fibrillation; Risk Factors:Dyslipidemia. Pt                  tested positive for novel coronavirus on 11/01/2019.  Sonographer:     Charmayne Sheer RDCS (AE) Referring Phys:  [id number] [place] Diagnosing Phys: Bartholome Bill MD  Sonographer Comments: Suboptimal apical window and no subcostal window. TDS due to pt position w/ hands clasped on chest. IMPRESSIONS  1. Left ventricular ejection fraction, by  visual estimation, is 55 to 60%. The left ventricle has normal function. Left ventricular septal wall thickness was normal. Normal left ventricular posterior wall thickness. There is no left ventricular hypertrophy.  2. The left ventricle has no regional wall motion abnormalities.  3. Global right ventricle has normal systolic function.The right ventricular size is normal. No increase in right ventricular wall thickness.  4. Left atrial size was normal.  5. Right atrial size was normal.  6. The mitral valve is grossly normal. Trivial mitral valve regurgitation.  7. The tricuspid valve is grossly normal.  8. The tricuspid valve is grossly normal. Tricuspid valve regurgitation is mild.  9. The aortic valve was not well visualized. Aortic valve regurgitation is not visualized. Mild aortic valve sclerosis without stenosis. 10. The pulmonic valve was not well visualized. Pulmonic valve regurgitation is not visualized. 11. The aortic root was not well visualized. 12. The interatrial septum was not assessed. FINDINGS  Left Ventricle: Left ventricular ejection fraction, by visual estimation, is 55 to 60%. The left ventricle has normal function. The left ventricle has no regional wall motion abnormalities. Normal left ventricular posterior wall thickness. There is no left ventricular hypertrophy. Right Ventricle: The right ventricular size is normal. No increase in right ventricular wall thickness. Global RV systolic function is has normal systolic function. Left Atrium: Left atrial size was normal in size. Right Atrium: Right atrial size was normal in size Pericardium: There is no evidence of pericardial effusion. Mitral Valve: The mitral valve is grossly normal. Trivial mitral valve regurgitation. MV peak gradient, 3.4 mmHg. Tricuspid Valve: The tricuspid valve is grossly normal. Tricuspid valve regurgitation is mild. Aortic Valve: The aortic valve was not well visualized. Aortic valve regurgitation is not visualized. Mild  aortic valve sclerosis is present, with no evidence of aortic valve stenosis. Aortic valve mean gradient measures 1.0 mmHg. Aortic valve peak gradient measures 3.0 mmHg. Aortic valve area, by VTI measures 3.97 cm. Pulmonic Valve: The pulmonic valve was not well visualized. Pulmonic valve regurgitation is not visualized. Pulmonic regurgitation is not visualized. Aorta: The aortic root was not well visualized and the aortic root is normal in size and structure. IAS/Shunts: The interatrial septum was not assessed.  LEFT VENTRICLE PLAX 2D LVIDd:         5.13 cm  Diastology LVIDs:  3.50 cm  LV e' lateral:   8.92 cm/s LV PW:         0.81 cm  LV E/e' lateral: 8.4 LV IVS:        0.63 cm  LV e' medial:    8.38 cm/s LVOT diam:     2.30 cm  LV E/e' medial:  8.9 LV SV:         75 ml LV SV Index:   38.31 LVOT Area:     4.15 cm  LEFT ATRIUM         Index LA diam:    3.20 cm 1.63 cm/m  AORTIC VALVE                   PULMONIC VALVE AV Area (Vmax):    3.49 cm    PV Vmax:       1.08 m/s AV Area (Vmean):   3.69 cm    PV Vmean:      64.900 cm/s AV Area (VTI):     3.97 cm    PV VTI:        0.209 m AV Vmax:           86.10 cm/s  PV Peak grad:  4.7 mmHg AV Vmean:          55.600 cm/s PV Mean grad:  2.0 mmHg AV VTI:            0.201 m AV Peak Grad:      3.0 mmHg AV Mean Grad:      1.0 mmHg LVOT Vmax:         72.40 cm/s LVOT Vmean:        49.400 cm/s LVOT VTI:          0.192 m LVOT/AV VTI ratio: 0.96  AORTA Ao Root diam: 3.60 cm MITRAL VALVE MV Area (PHT): 2.32 cm             SHUNTS MV Peak grad:  3.4 mmHg             Systemic VTI:  0.19 m MV Mean grad:  2.0 mmHg             Systemic Diam: 2.30 cm MV Vmax:       0.92 m/s MV Vmean:      60.0 cm/s MV VTI:        0.30 m MV PHT:        94.83 msec MV Decel Time: 327 msec MV E velocity: 74.70 cm/s 103 cm/s MV A velocity: 85.40 cm/s 70.3 cm/s MV E/A ratio:  0.87       1.5  Bartholome Bill MD Electronically signed by Bartholome Bill MD Signature Date/Time: 11/02/2019/1:48:40 PM    Final      Scheduled Meds:  amiodarone  200 mg Oral QHS   apixaban  5 mg Oral BID   Chlorhexidine Gluconate Cloth  6 each Topical Daily   dexamethasone  6 mg Oral q1800   feeding supplement (ENSURE ENLIVE)  237 mL Oral BID BM   finasteride  5 mg Oral Daily   memantine  10 mg Oral BID   mirtazapine  15 mg Oral QHS   rosuvastatin  40 mg Oral q1800   tamsulosin  0.4 mg Oral Daily   Continuous Infusions:  ertapenem Stopped (11/01/19 2153)   fluconazole (DIFLUCAN) IV 200 mg (11/02/19 1020)   lactated ringers 125 mL/hr at 11/01/19 1924     LOS: 1 day   Time  spent: 45 minutes  Lorella Nimrod, MD [facility]  If 7PM-7AM, please contact night-coverage [email or web address]  11/02/2019, 1:53 PM   This record has been created using Systems analyst. Errors have been sought and corrected,but may not always be located. Such creation errors do not reflect on the standard of care.

## 2019-11-02 NOTE — Progress Notes (Addendum)
This nurse attempted to flush pt foley without success. MD made aware. Will continue to monitor.   Edit: @1000  myself and CN Estill Bamberg went into pt room and was able to flush foley.

## 2019-11-02 NOTE — Plan of Care (Signed)
  Problem: Education: Goal: Knowledge of risk factors and measures for prevention of condition will improve Outcome: Progressing   Problem: Coping: Goal: Psychosocial and spiritual needs will be supported Outcome: Progressing   Problem: Respiratory: Goal: Will maintain a patent airway Outcome: Progressing Goal: Complications related to the disease process, condition or treatment will be avoided or minimized Outcome: Progressing   

## 2019-11-02 NOTE — Progress Notes (Signed)
    BRIEF OVERNIGHT PROGRESS REPORT   SUBJECTIVE: Foley catheter draining large bloody clots.  Per nursing staff, patient got up from the bed and  large clotted blood drained into the urometer filling and causing backup towards the Foley catheter clogging it.  OBJECTIVE: On arrival to the bedside, he was afebrile with blood pressure 119/54 mm Hg and pulse rate 82 beats/min. There were no focal neurological deficits; he was alert but disoriented x4.  On assessment patient was noted to have bluish discoloration of his penis with large clots of blood backing up into the Foley.  No signs or symptoms of hemorrhagic shock.  Patient complaining of lower abdominal discomfort.  ASSESSMENT:Kimbrell is an 86 y.o. male past medical history significant for dementia, BPH with urinary retention requiring chronic Foley catheter, HTN, A. fib, COPD who was brought in from urology clinic for hypotension and weakness.  Patient found to have UTI  PLAN: 1.  Hematuria -likely due to cystitis from UTI in a patient with history of BPH and urinary retention requiring chronic Foley catheter. - Foley previously replaced in the ED with 14 [place].  Discussed with on-call urologist Dr. Diamantina Providence who recommends to switch out her Foley catheter with a 26 [place] which was successful placed. - Holding Eliquis - Urology consult - Follow H&H - IV Fluids hydration  2. UTI -recent history of UTI with ESBL E. coli - Continue Empiric tx with Eetapenem pending urine culture - UA shows yeast we will treat with fluconazole as well.   Rufina Falco, DNP, CCRN, FNP-C [facility] Nurse Practitioner Between 7pm to 7am - Pager [phone]  After 7am go to [email or web address] - password:[email or web address]  [facility] [employer]  [phone]

## 2019-11-03 LAB — CBC WITH DIFFERENTIAL/PLATELET
Abs Immature Granulocytes: 0.05 10*3/uL (ref 0.00–0.07)
Basophils Absolute: 0 10*3/uL (ref 0.0–0.1)
Basophils Relative: 0 %
Eosinophils Absolute: 0 10*3/uL (ref 0.0–0.5)
Eosinophils Relative: 0 %
HCT: 28.9 % — ABNORMAL LOW (ref 39.0–52.0)
Hemoglobin: 8.7 g/dL — ABNORMAL LOW (ref 13.0–17.0)
Immature Granulocytes: 1 %
Lymphocytes Relative: 13 %
Lymphs Abs: 1.1 10*3/uL (ref 0.7–4.0)
MCH: 30.2 pg (ref 26.0–34.0)
MCHC: 30.1 g/dL (ref 30.0–36.0)
MCV: 100.3 fL — ABNORMAL HIGH (ref 80.0–100.0)
Monocytes Absolute: 0.6 10*3/uL (ref 0.1–1.0)
Monocytes Relative: 7 %
Neutro Abs: 6.8 10*3/uL (ref 1.7–7.7)
Neutrophils Relative %: 79 %
Platelets: 223 10*3/uL (ref 150–400)
RBC: 2.88 MIL/uL — ABNORMAL LOW (ref 4.22–5.81)
RDW: 14.6 % (ref 11.5–15.5)
WBC: 8.6 10*3/uL (ref 4.0–10.5)
nRBC: 0 % (ref 0.0–0.2)

## 2019-11-03 LAB — COMPREHENSIVE METABOLIC PANEL
ALT: 21 U/L (ref 0–44)
AST: 24 U/L (ref 15–41)
Albumin: 1.5 g/dL — ABNORMAL LOW (ref 3.5–5.0)
Alkaline Phosphatase: 66 U/L (ref 38–126)
Anion gap: 6 (ref 5–15)
BUN: 16 mg/dL (ref 8–23)
CO2: 27 mmol/L (ref 22–32)
Calcium: 7.7 mg/dL — ABNORMAL LOW (ref 8.9–10.3)
Chloride: 115 mmol/L — ABNORMAL HIGH (ref 98–111)
Creatinine, Ser: 1 mg/dL (ref 0.61–1.24)
GFR calc Af Amer: >60 mL/min (ref >60)
GFR calc non Af Amer: >60 mL/min (ref >60)
Glucose, Bld: 95 mg/dL (ref 70–99)
Potassium: 4.1 mmol/L (ref 3.5–5.1)
Sodium: 148 mmol/L — ABNORMAL HIGH (ref 135–145)
Total Bilirubin: 0.8 mg/dL (ref 0.3–1.2)
Total Protein: 4.7 g/dL — ABNORMAL LOW (ref 6.5–8.1)

## 2019-11-03 LAB — C-REACTIVE PROTEIN: CRP: 3 mg/dL — ABNORMAL HIGH (ref <1.0)

## 2019-11-03 MED ORDER — CARVEDILOL 3.125 MG PO TABS
3.1250 mg | ORAL_TABLET | Freq: Two times a day (BID) | ORAL | Status: DC
  Administered 2019-11-03 – 2019-11-05 (×3): 3.125 mg via ORAL
  Filled 2019-11-03 (×4): qty 1

## 2019-11-03 MED ORDER — LORAZEPAM 2 MG/ML IJ SOLN: 1.0000 mg | Freq: Once | INTRAMUSCULAR | Status: DC

## 2019-11-03 MED ORDER — ENALAPRIL MALEATE 2.5 MG PO TABS
2.5000 mg | ORAL_TABLET | Freq: Every day | ORAL | Status: DC
  Administered 2019-11-04: 2.5 mg via ORAL
  Filled 2019-11-03 (×3): qty 1

## 2019-11-03 MED ORDER — ISOSORBIDE MONONITRATE ER 30 MG PO TB24
30.0000 mg | ORAL_TABLET | Freq: Every day | ORAL | Status: DC
  Administered 2019-11-03 – 2019-11-04 (×2): 30 mg via ORAL
  Filled 2019-11-03 (×2): qty 1

## 2019-11-03 MED ORDER — CARVEDILOL 3.125 MG PO TABS: 6.2500 mg | ORAL_TABLET | Freq: Two times a day (BID) | ORAL | Status: DC

## 2019-11-03 NOTE — Progress Notes (Signed)
PROGRESS NOTE    Chris Simon  [id number] DOB: 08-29-1934 DOA: 11/01/2019 PCP: [name], MD   Brief Narrative:  Chris Simon is an 86 y.o. male past medical history significant for dementia, BPH with urinary retention requiring chronic Foley catheter, HTN, A. fib, COPD who was brought in from urology clinic for hypotension and weakness. On arrival patient was hypotensive with blood pressure of 69/76, tachypneic tachycardic, meeting sepsis criteria and resuscitated with IV fluids and started on broad-spectrum antibiotics.  Procalcitonin was negative.  Patient has chronic Foley with recent infection with E. coli ESBL. He was also recently treated for Covid pneumonia in December 2020 at [facility] and discharged to rehab after prolonged hospitalization.  Another admission with recurrent hypoxic respiratory failure and multifocal pneumonia, he was treated with antibiotics although procalcitonin was negative, improved and return back to [facility].  Subjective: Patient appears more alert but oriented to self only.  He did eat his breakfast.  Assessment & Plan:   Principal Problem:   Sepsis ([place]) Active Problems:   Lower urinary tract infectious disease   Atrial fibrillation (HCC)   Bladder retention   Acute respiratory failure with hypoxia (HCC)   Chronic indwelling Foley catheter   Alzheimer's dementia without behavioral disturbance (HCC)   Pneumonia due to COVID-19 virus   COPD (chronic obstructive pulmonary disease) ([place])  Sepsis/encephalopathy.  Most likely secondary to UTI.  Urine looks infected.  Patient has a chronic Foley catheter.  Chest x-ray with bilateral infiltrate and a small pneumo and hydrothorax, most likely post Covid pneumonitis. He was given IV fluid resulted in improvement in his blood pressure. -ID was consulted-appreciate their recommendations. -Urine culture and blood culture without any growth.  Discontinue ertapenem and monitor. -Urine with  some yeast-most likely colonization-no need to treat.  Gross hematuria.  Appears traumatic.  Patient had clogged Foley with blood clots overnight, changed Foley x2 since admission. Urology was consulted-advising irrigation and holding Eliquis. -They will see him as an outpatient in 2 weeks for voiding trial. -Patient with chronic Foley catheter and advanced dementia-might need a suprapubic catheter provide urethral trauma. -Monitor hemoglobin-dropped to 8.7 today from 10. -Also use mittens as he continued to manipulate his genitalia and can cause another accidental trauma.  Urine appears clear now. -Holding Eliquis.  Multifocal pneumonia/pulmonary infiltrate with small pneumo and hydrothorax.  Most likely post Covid pneumonitis. -Echocardiogram was within normal limits. -Continue with Decadron 6 mg IV daily.   Hypertension.  Blood pressure mildly elevated. -We will restart home dose of Imdur and enalapril. -Restart carvedilol at a lower dose.  Dementia.  Patient has advanced dementia which is also contributing to his encephalopathy. -Continue home dose of donepezil, memantine and Remeron.  COPD. Continue home regimen of inhaled bronchodilators Small tiny pneumothoraces most likely popped blebs from his emphysema.  Atrial fibrillation.  Currently rate controlled. -Continue with amiodarone. -Restart carvedilol at 3.125 mg which is lower than his normal home dose. -Hold Eliquis for few days due to gross hematuria.  BPH.  Continue home dose of finasteride and tamsulosin.  End-of-life discussion.  Done by Dr. Jamse Arn.  See her notes below. Spoke at length with patient's daughter about patient's prognosis. She notes that she just lost her husband to Covid pneumonia. She states she would not like her father to ever be intubated. However she would otherwise like resuscitation protocol. I stated that it would be highly unlikely for anyone to survive resuscitation without intubation and  also that his likelihood of  successful resuscitation would be extremely low. Daughter would like for patient to have a limited code with DNI but other resuscitation procedures to be followed including CPR. Patient has a poor prognosis due to advanced dementia and multiple comorbidities with recent Covid infection and current severe encephalopathy. -I will involve palliative for further discussion.   Objective: Vitals:   11/02/19 1700 11/02/19 1833 11/02/19 2321 11/03/19 0837  BP:  (!) 139/59 (!) 130/56 115/62  Pulse: 63 64 87 63  Resp:   19 18  Temp:  98.4 F (36.9 C) 98.4 F (36.9 C) 97.7 F (36.5 C)  TempSrc:  Oral Oral Oral  SpO2: 99% 96% 94% 94%  Weight:      Height:        Intake/Output Summary (Last 24 hours) at 11/03/2019 1428 Last data filed at 11/02/2019 2217 Gross per 24 hour  Intake 382.99 ml  Output --  Net 382.99 ml   Filed Weights   11/01/19 1039  Weight: 74.8 kg    Examination:  General exam: Chronically ill-appearing, frail elderly man, appears calm and comfortable. Respiratory system: Clear to auscultation. Respiratory effort normal. Cardiovascular system: S1 & S2 heard, RRR. No JVD, murmurs, rubs, gallops or clicks. Gastrointestinal system: Soft, nontender, nondistended, bowel sounds positive. Central nervous system: Awake and oriented to self only.  Doing simple commands.  No focal deficit. Extremities: No edema, no cyanosis, pulses intact and symmetrical.  Psychiatry: Judgement and insight appear severely impaired.   DVT prophylaxis: SCDs Code Status: Partial/DNI Family Communication: No family at bedside. Disposition Plan: Pending improvement.  Patient is very sick with bad prognosis. High risk for deterioration and death.  Consultants:   Urology  ID  Procedures:  Antimicrobials:  Ertapenem  Data Reviewed: I have personally reviewed following labs and imaging studies  CBC: Recent Labs  Lab 11/01/19 1129 11/02/19 0302 11/03/19 0530   WBC 10.6* 10.5 8.6  NEUTROABS 7.6 9.2* 6.8  HGB 10.0* 10.1* 8.7*  HCT 34.1* 34.2* 28.9*  MCV 102.1* 101.5* 100.3*  PLT 291 250 [id number]   Basic Metabolic Panel: Recent Labs  Lab 11/01/19 1129 11/02/19 0302 11/03/19 0530  NA 148* 146* 148*  K 3.9 4.1 4.1  CL 112* 114* 115*  CO2 28 27 27   GLUCOSE 101* 109* 95  BUN 21 19 16   CREATININE 1.32* 1.06 1.00  CALCIUM 7.9* 7.6* 7.7*   GFR: Estimated Creatinine Clearance: 59.2 mL/min (by C-G formula based on SCr of 1 mg/dL). Liver Function Tests: Recent Labs  Lab 11/01/19 1129 11/02/19 0302 11/03/19 0530  AST 25 26 24   ALT 20 22 21   ALKPHOS 89 83 66  BILITOT 1.4* 0.8 0.8  PROT 5.8* 5.6* 4.7*  ALBUMIN 1.8* 1.8* 1.5*   No results for input(s): LIPASE, AMYLASE in the last 168 hours. No results for input(s): AMMONIA in the last 168 hours. Coagulation Profile: No results for input(s): INR, PROTIME in the last 168 hours. Cardiac Enzymes: No results for input(s): CKTOTAL, CKMB, CKMBINDEX, TROPONINI in the last 168 hours. BNP (last 3 results) No results for input(s): PROBNP in the last 8760 hours. HbA1C: No results for input(s): HGBA1C in the last 72 hours. CBG: No results for input(s): GLUCAP in the last 168 hours. Lipid Profile: No results for input(s): CHOL, HDL, LDLCALC, TRIG, CHOLHDL, LDLDIRECT in the last 72 hours. Thyroid Function Tests: No results for input(s): TSH, T4TOTAL, FREET4, T3FREE, THYROIDAB in the last 72 hours. Anemia Panel: No results for input(s): VITAMINB12, FOLATE, FERRITIN, TIBC, IRON, RETICCTPCT in  the last 72 hours. Sepsis Labs: Recent Labs  Lab 11/01/19 1129  PROCALCITON <0.10  LATICACIDVEN 1.1    Recent Results (from the past 240 hour(s))  Urine culture     Status: Abnormal   Collection Time: 11/01/19 11:29 AM   Specimen: Urine, Random  Result Value Ref Range Status   Specimen Description   Final    URINE, RANDOM Performed at [facility], [address], [place], [place]  [address]    Special Requests   Final    NONE Performed at [facility], [address] [place], [place] [address]    Culture (A)  Final    <10,000 COLONIES/mL INSIGNIFICANT GROWTH Performed at [facility], [place] [address] [place], [place] [address]    Report Status 11/02/2019 FINAL  Final  Culture, blood (Routine x 2)     Status: None (Preliminary result)   Collection Time: 11/01/19 11:47 AM   Specimen: BLOOD  Result Value Ref Range Status   Specimen Description BLOOD RIGHT ANTECUBITAL  Final   Special Requests   Final    BOTTLES DRAWN AEROBIC AND ANAEROBIC Blood Culture results may not be optimal due to an excessive volume of blood received in culture bottles   Culture   Final    NO GROWTH 2 DAYS Performed at [facility], [address], [place], [place] [address]    Report Status PENDING  Incomplete  Culture, blood (Routine x 2)     Status: None (Preliminary result)   Collection Time: 11/01/19  1:28 PM   Specimen: BLOOD  Result Value Ref Range Status   Specimen Description BLOOD LEFT ANTECUBITAL  Final   Special Requests   Final    BOTTLES DRAWN AEROBIC AND ANAEROBIC Blood Culture adequate volume   Culture   Final    NO GROWTH 2 DAYS Performed at [facility], [address], [place], [place] [address]    Report Status PENDING  Incomplete  Respiratory Panel by RT PCR (Flu A&B, Covid) - Nasopharyngeal Swab     Status: Abnormal   Collection Time: 11/01/19  3:27 PM   Specimen: Nasopharyngeal Swab  Result Value Ref Range Status   SARS Coronavirus 2 by RT PCR POSITIVE (A) NEGATIVE Final    Comment: RESULT CALLED TO, READ BACK BY AND VERIFIED WITH: TUMEY AT 1730 ON 11/01/2019 BY MOSLEY,J (NOTE) SARS-CoV-2 target nucleic acids are DETECTED. SARS-CoV-2 RNA is generally detectable in upper respiratory specimens  during the acute phase of infection. Positive results are indicative of the presence of the identified virus, but do not rule  out bacterial infection or co-infection with other pathogens not detected by the test. Clinical correlation with patient history and other diagnostic information is necessary to determine patient infection status. The expected result is Negative. Fact Sheet for Patients:  PinkCheek.be Fact Sheet for Healthcare Providers: GravelBags.it This test is not yet approved or cleared by the [place] FDA and  has been authorized for detection and/or diagnosis of SARS-CoV-2 by FDA under an Emergency Use Authorization (EUA).  This EUA will remain in effect (meaning this test can be used)  for the duration of  the COVID-19 declaration under Section 564(b)(1) of the Act, 21 U.S.C. section 360bbb-3(b)(1), unless the authorization is terminated or revoked sooner.    Influenza A by PCR NEGATIVE NEGATIVE Final   Influenza B by PCR NEGATIVE NEGATIVE Final    Comment: (NOTE) The Xpert Xpress SARS-CoV-2/FLU/RSV assay is intended as an aid in  the diagnosis of influenza from Nasopharyngeal swab specimens and  should not be used as a sole basis for treatment. Nasal washings and  aspirates are unacceptable for Xpert Xpress SARS-CoV-2/FLU/RSV  testing. Fact Sheet for Patients: PinkCheek.be Fact Sheet for Healthcare Providers: GravelBags.it This test is not yet approved or cleared by the [place] FDA and  has been authorized for detection and/or diagnosis of SARS-CoV-2 by  FDA under an Emergency Use Authorization (EUA). This EUA will remain  in effect (meaning this test can be used) for the duration of the  Covid-19 declaration under Section 564(b)(1) of the Act, 21  U.S.C. section 360bbb-3(b)(1), unless the authorization is  terminated or revoked. Performed at [facility], [address] [place], [place] [address]      Radiology Studies: ECHOCARDIOGRAM  COMPLETE  Result Date: 11/02/2019   ECHOCARDIOGRAM REPORT   Patient Name:   Chris Simon Date of Exam: 11/02/2019 Medical Rec #:  [id number]         Height:       72.0 in Accession #:    [id number]        Weight:       165.0 lb Date of Birth:  [birth year]         BSA:          1.96 m Patient Age:    18 years          BP:           97/44 mmHg Patient Gender: M                 HR:           63 bpm. Exam Location:  [facility] Procedure: 2D Echo, Color Doppler and Cardiac Doppler Indications:     I50.31 CHF-Acute Diastolic  History:         Patient has prior history of Echocardiogram examinations. COPD,                  Arrythmias:Atrial Fibrillation; Risk Factors:Dyslipidemia. Pt                  tested positive for novel coronavirus on 11/01/2019.  Sonographer:     Charmayne Sheer RDCS (AE) Referring Phys:  [id number] [place] Diagnosing Phys: Bartholome Bill MD  Sonographer Comments: Suboptimal apical window and no subcostal window. TDS due to pt position w/ hands clasped on chest. IMPRESSIONS  1. Left ventricular ejection fraction, by visual estimation, is 55 to 60%. The left ventricle has normal function. Left ventricular septal wall thickness was normal. Normal left ventricular posterior wall thickness. There is no left ventricular hypertrophy.  2. The left ventricle has no regional wall motion abnormalities.  3. Global right ventricle has normal systolic function.The right ventricular size is normal. No increase in right ventricular wall thickness.  4. Left atrial size was normal.  5. Right atrial size was normal.  6. The mitral valve is grossly normal. Trivial mitral valve regurgitation.  7. The tricuspid valve is grossly normal.  8. The tricuspid valve is grossly normal. Tricuspid valve regurgitation is mild.  9. The aortic valve was not well visualized. Aortic valve regurgitation is not visualized. Mild aortic valve sclerosis without stenosis. 10. The pulmonic valve was not well visualized. Pulmonic valve  regurgitation is not visualized. 11. The aortic root was not well visualized. 12. The interatrial septum was not assessed. FINDINGS  Left Ventricle: Left ventricular ejection fraction, by visual estimation, is 55 to 60%.  The left ventricle has normal function. The left ventricle has no regional wall motion abnormalities. Normal left ventricular posterior wall thickness. There is no left ventricular hypertrophy. Right Ventricle: The right ventricular size is normal. No increase in right ventricular wall thickness. Global RV systolic function is has normal systolic function. Left Atrium: Left atrial size was normal in size. Right Atrium: Right atrial size was normal in size Pericardium: There is no evidence of pericardial effusion. Mitral Valve: The mitral valve is grossly normal. Trivial mitral valve regurgitation. MV peak gradient, 3.4 mmHg. Tricuspid Valve: The tricuspid valve is grossly normal. Tricuspid valve regurgitation is mild. Aortic Valve: The aortic valve was not well visualized. Aortic valve regurgitation is not visualized. Mild aortic valve sclerosis is present, with no evidence of aortic valve stenosis. Aortic valve mean gradient measures 1.0 mmHg. Aortic valve peak gradient measures 3.0 mmHg. Aortic valve area, by VTI measures 3.97 cm. Pulmonic Valve: The pulmonic valve was not well visualized. Pulmonic valve regurgitation is not visualized. Pulmonic regurgitation is not visualized. Aorta: The aortic root was not well visualized and the aortic root is normal in size and structure. IAS/Shunts: The interatrial septum was not assessed.  LEFT VENTRICLE PLAX 2D LVIDd:         5.13 cm  Diastology LVIDs:         3.50 cm  LV e' lateral:   8.92 cm/s LV PW:         0.81 cm  LV E/e' lateral: 8.4 LV IVS:        0.63 cm  LV e' medial:    8.38 cm/s LVOT diam:     2.30 cm  LV E/e' medial:  8.9 LV SV:         75 ml LV SV Index:   38.31 LVOT Area:     4.15 cm  LEFT ATRIUM         Index LA diam:    3.20 cm 1.63 cm/m   AORTIC VALVE                   PULMONIC VALVE AV Area (Vmax):    3.49 cm    PV Vmax:       1.08 m/s AV Area (Vmean):   3.69 cm    PV Vmean:      64.900 cm/s AV Area (VTI):     3.97 cm    PV VTI:        0.209 m AV Vmax:           86.10 cm/s  PV Peak grad:  4.7 mmHg AV Vmean:          55.600 cm/s PV Mean grad:  2.0 mmHg AV VTI:            0.201 m AV Peak Grad:      3.0 mmHg AV Mean Grad:      1.0 mmHg LVOT Vmax:         72.40 cm/s LVOT Vmean:        49.400 cm/s LVOT VTI:          0.192 m LVOT/AV VTI ratio: 0.96  AORTA Ao Root diam: 3.60 cm MITRAL VALVE MV Area (PHT): 2.32 cm             SHUNTS MV Peak grad:  3.4 mmHg             Systemic VTI:  0.19 m MV Mean grad:  2.0 mmHg  Systemic Diam: 2.30 cm MV Vmax:       0.92 m/s MV Vmean:      60.0 cm/s MV VTI:        0.30 m MV PHT:        94.83 msec MV Decel Time: 327 msec MV E velocity: 74.70 cm/s 103 cm/s MV A velocity: 85.40 cm/s 70.3 cm/s MV E/A ratio:  0.87       1.5  Bartholome Bill MD Electronically signed by Bartholome Bill MD Signature Date/Time: 11/02/2019/1:48:40 PM    Final     Scheduled Meds: . amiodarone  200 mg Oral QHS  . Chlorhexidine Gluconate Cloth  6 each Topical Daily  . dexamethasone  6 mg Oral q1800  . feeding supplement (ENSURE ENLIVE)  237 mL Oral BID BM  . finasteride  5 mg Oral Daily  . memantine  10 mg Oral BID  . mirtazapine  15 mg Oral QHS  . rosuvastatin  40 mg Oral q1800  . tamsulosin  0.4 mg Oral Daily   Continuous Infusions: . lactated ringers 75 mL/hr at 11/03/19 1314     LOS: 2 days   Time spent: 40 minutes  Lorella Nimrod, MD [facility]  If 7PM-7AM, please contact night-coverage [email or web address]  11/03/2019, 2:28 PM   This record has been created using Systems analyst. Errors have been sought and corrected,but may not always be located. Such creation errors do not reflect on the standard of care.

## 2019-11-04 LAB — COMPREHENSIVE METABOLIC PANEL
ALT: 20 U/L (ref 0–44)
AST: 22 U/L (ref 15–41)
Albumin: 1.5 g/dL — ABNORMAL LOW (ref 3.5–5.0)
Alkaline Phosphatase: 68 U/L (ref 38–126)
Anion gap: 4 — ABNORMAL LOW (ref 5–15)
BUN: 18 mg/dL (ref 8–23)
CO2: 29 mmol/L (ref 22–32)
Calcium: 7.5 mg/dL — ABNORMAL LOW (ref 8.9–10.3)
Chloride: 109 mmol/L (ref 98–111)
Creatinine, Ser: 0.9 mg/dL (ref 0.61–1.24)
GFR calc Af Amer: >60 mL/min (ref >60)
GFR calc non Af Amer: >60 mL/min (ref >60)
Glucose, Bld: 154 mg/dL — ABNORMAL HIGH (ref 70–99)
Potassium: 4.9 mmol/L (ref 3.5–5.1)
Sodium: 142 mmol/L (ref 135–145)
Total Bilirubin: 0.3 mg/dL (ref 0.3–1.2)
Total Protein: 4.6 g/dL — ABNORMAL LOW (ref 6.5–8.1)

## 2019-11-04 LAB — CBC WITH DIFFERENTIAL/PLATELET
Abs Immature Granulocytes: 0.03 10*3/uL (ref 0.00–0.07)
Basophils Absolute: 0 10*3/uL (ref 0.0–0.1)
Basophils Relative: 0 %
Eosinophils Absolute: 0 10*3/uL (ref 0.0–0.5)
Eosinophils Relative: 0 %
HCT: 25.5 % — ABNORMAL LOW (ref 39.0–52.0)
Hemoglobin: 7.6 g/dL — ABNORMAL LOW (ref 13.0–17.0)
Immature Granulocytes: 1 %
Lymphocytes Relative: 14 %
Lymphs Abs: 0.6 10*3/uL — ABNORMAL LOW (ref 0.7–4.0)
MCH: 29.7 pg (ref 26.0–34.0)
MCHC: 29.8 g/dL — ABNORMAL LOW (ref 30.0–36.0)
MCV: 99.6 fL (ref 80.0–100.0)
Monocytes Absolute: 0.1 10*3/uL (ref 0.1–1.0)
Monocytes Relative: 1 %
Neutro Abs: 3.7 10*3/uL (ref 1.7–7.7)
Neutrophils Relative %: 84 %
Platelets: 187 10*3/uL (ref 150–400)
RBC: 2.56 MIL/uL — ABNORMAL LOW (ref 4.22–5.81)
RDW: 14.6 % (ref 11.5–15.5)
WBC: 4.5 10*3/uL (ref 4.0–10.5)
nRBC: 0 % (ref 0.0–0.2)

## 2019-11-04 LAB — CBC
HCT: 30.7 % — ABNORMAL LOW (ref 39.0–52.0)
Hemoglobin: 9.7 g/dL — ABNORMAL LOW (ref 13.0–17.0)
MCH: 30.9 pg (ref 26.0–34.0)
MCHC: 31.6 g/dL (ref 30.0–36.0)
MCV: 97.8 fL (ref 80.0–100.0)
Platelets: 199 10*3/uL (ref 150–400)
RBC: 3.14 MIL/uL — ABNORMAL LOW (ref 4.22–5.81)
RDW: 14.5 % (ref 11.5–15.5)
WBC: 7.3 10*3/uL (ref 4.0–10.5)
nRBC: 0 % (ref 0.0–0.2)

## 2019-11-04 LAB — C-REACTIVE PROTEIN: CRP: 1.1 mg/dL — ABNORMAL HIGH (ref <1.0)

## 2019-11-04 LAB — PREPARE RBC (CROSSMATCH)

## 2019-11-04 MED ORDER — SODIUM CHLORIDE 0.9% IV SOLUTION: Freq: Once | INTRAVENOUS | Status: AC

## 2019-11-04 MED ORDER — SODIUM CHLORIDE 0.9 % IV SOLN
510.0000 mg | Freq: Once | INTRAVENOUS | Status: AC
  Administered 2019-11-04: 510 mg via INTRAVENOUS
  Filled 2019-11-04: qty 17

## 2019-11-04 NOTE — TOC Progression Note (Signed)
Transition of Care [facility]) - Progression Note    Patient Details  Name: Chris Simon MRN: [id number] Date of Birth: 1934/03/23  [facility] of [facility]) CM/SW Contact  Boris Sharper, LCSW Phone Number:[phone] 11/04/2019, 11:37 AM  Clinical Narrative:     Pt is medically stable for discharge. CSW spoke to facility and they do not have his medication and stated that he can come Monday 2/8 once his medication arrives.  TOC will continue to follow discharge needs.       Expected Discharge Plan and Services                                                 Social Determinants of Health (SDOH) Interventions    Readmission Risk Interventions No flowsheet data found.

## 2019-11-04 NOTE — Progress Notes (Signed)
PROGRESS NOTE    Chris Simon  [id number] DOB: 1934-03-02 DOA: 11/01/2019 PCP: [name], MD   Brief Narrative:  Chris Simon is an 86 y.o. male past medical history significant for dementia, BPH with urinary retention requiring chronic Foley catheter, HTN, A. fib, COPD who was brought in from urology clinic for hypotension and weakness. On arrival patient was hypotensive with blood pressure of 69/76, tachypneic tachycardic, meeting sepsis criteria and resuscitated with IV fluids and started on broad-spectrum antibiotics.  Procalcitonin was negative.  Patient has chronic Foley with recent infection with E. coli ESBL. He was also recently treated for Covid pneumonia in December 2020 at [facility] and discharged to rehab after prolonged hospitalization.  Another admission with recurrent hypoxic respiratory failure and multifocal pneumonia, he was treated with antibiotics although procalcitonin was negative, improved and return back to [facility].  Subjective: Patient appears more alert but oriented to self only.  No other concerns.  Assessment & Plan:   Principal Problem:   Sepsis ([place]) Active Problems:   Lower urinary tract infectious disease   Atrial fibrillation (HCC)   Bladder retention   Acute respiratory failure with hypoxia (HCC)   Chronic indwelling Foley catheter   Alzheimer's dementia without behavioral disturbance (HCC)   Pneumonia due to COVID-19 virus   COPD (chronic obstructive pulmonary disease) ([place])  Sepsis/encephalopathy.  Most likely secondary to UTI.  Urine looks infected.  Patient has a chronic Foley catheter.  Chest x-ray with bilateral infiltrate and a small pneumo and hydrothorax, most likely post Covid pneumonitis. He was given IV fluid resulted in improvement in his blood pressure. -ID was consulted-appreciate their recommendations. -Urine culture and blood culture without any growth.  Discontinue ertapenem and monitor. -Urine with some  yeast-most likely colonization-no need to treat.  Gross hematuria.  Resolved.  Urine looks clear now.  Appears traumatic.  Patient had clogged Foley with blood clots overnight, changed Foley x2 since admission. Urology was consulted-advising irrigation and holding Eliquis. -They will see him as an outpatient in 2 weeks for voiding trial. -Patient with chronic Foley catheter and advanced dementia-might need a suprapubic catheter provide urethral trauma. -Monitor hemoglobin-dropped to 7.6 today from 10. -Give him 1 unit of packed RBCs. -Keep holding Eliquis. -Start him on iron supplement.  Multifocal pneumonia/pulmonary infiltrate with small pneumo and hydrothorax.  Most likely post Covid pneumonitis. -Echocardiogram was within normal limits. -Switch him to p.o. Decadron 4 mg daily with a taper.  Hypertension.  Blood pressure within goal. -Continue home dose of Imdur and enalapril. -Continue carvedilol at a lower dose.  Dementia.  Patient has advanced dementia which is also contributing to his encephalopathy. -Continue home dose of donepezil, memantine and Remeron.  COPD. Continue home regimen of inhaled bronchodilators Small tiny pneumothoraces most likely popped blebs from his emphysema.  Atrial fibrillation.  Currently rate controlled. -Continue with amiodarone. -Restart carvedilol at 3.125 mg which is lower than his normal home dose. -Hold Eliquis for few days due to gross hematuria.  BPH.  Continue home dose of finasteride and tamsulosin.  End-of-life discussion.  Done by Dr. Jamse Arn.  See her notes below. Spoke at length with patient's daughter about patient's prognosis. She notes that she just lost her husband to Covid pneumonia. She states she would not like her father to ever be intubated. However she would otherwise like resuscitation protocol. I stated that it would be highly unlikely for anyone to survive resuscitation without intubation and also that his  likelihood of successful resuscitation  would be extremely low. Daughter would like for patient to have a limited code with DNI but other resuscitation procedures to be followed including CPR. Patient has a poor prognosis due to advanced dementia and multiple comorbidities with recent Covid infection and current severe encephalopathy. -I will involve palliative for further discussion.   Objective: Vitals:   11/04/19 0814 11/04/19 0950 11/04/19 1315 11/04/19 1340  BP: 113/60 (!) 97/52 (!) 94/55 (!) 96/54  Pulse: (!) 55 62 63 61  Resp: 18  20 18   Temp: 98 F (36.7 C)  (!) 97.3 F (36.3 C) (!) 97.3 F (36.3 C)  TempSrc: Axillary  Oral Oral  SpO2: 93%  95% 95%  Weight:      Height:        Intake/Output Summary (Last 24 hours) at 11/04/2019 1350 Last data filed at 11/04/2019 1348 Gross per 24 hour  Intake 2894.27 ml  Output 1150 ml  Net 1744.27 ml   Filed Weights   11/01/19 1039  Weight: 74.8 kg    Examination:  General exam: Chronically ill-appearing, frail elderly man, appears calm and comfortable. Respiratory system: Clear to auscultation. Respiratory effort normal. Cardiovascular system: S1 & S2 heard, RRR. No JVD, murmurs, rubs, gallops or clicks. Gastrointestinal system: Soft, nontender, nondistended, bowel sounds positive. Central nervous system: Awake and oriented to self only.  Following simple commands.  No focal deficit. Extremities: No edema, no cyanosis, pulses intact and symmetrical. Psychiatry: Judgement and insight appear severely impaired.   DVT prophylaxis: SCDs Code Status: Partial/DNI Family Communication: Discussed with daughter on phone who also have his POA.  We discussed him going back to facility tomorrow.  Follow-up with urology.  His CODE STATUS and restarting of Eliquis as he will be high risk for recurrent bleed due to his habit of manipulating his genitalia with advanced dementia and pulling Foley catheter.  She does understand that Eliquis decreased  his risk of stroke due to A. fib but also increased the chance of bleeding.  She wants to think about these 2 issues and will come back to [place]. Disposition Plan: Medically stable to go back to facility.  They will take him back tomorrow.  Patient is very sick with bad prognosis. High risk for deterioration and death.  Consultants:   Urology  ID  Procedures:  Antimicrobials:  Ertapenem  Data Reviewed: I have personally reviewed following labs and imaging studies  CBC: Recent Labs  Lab 11/01/19 1129 11/02/19 0302 11/03/19 0530 11/04/19 0801  WBC 10.6* 10.5 8.6 4.5  NEUTROABS 7.6 9.2* 6.8 3.7  HGB 10.0* 10.1* 8.7* 7.6*  HCT 34.1* 34.2* 28.9* 25.5*  MCV 102.1* 101.5* 100.3* 99.6  PLT 291 250 223 [id number]   Basic Metabolic Panel: Recent Labs  Lab 11/01/19 1129 11/02/19 0302 11/03/19 0530 11/04/19 0801  NA 148* 146* 148* 142  K 3.9 4.1 4.1 4.9  CL 112* 114* 115* 109  CO2 28 27 27 29   GLUCOSE 101* 109* 95 154*  BUN 21 19 16 18   CREATININE 1.32* 1.06 1.00 0.90  CALCIUM 7.9* 7.6* 7.7* 7.5*   GFR: Estimated Creatinine Clearance: 65.8 mL/min (by C-G formula based on SCr of 0.9 mg/dL). Liver Function Tests: Recent Labs  Lab 11/01/19 1129 11/02/19 0302 11/03/19 0530 11/04/19 0801  AST 25 26 24 22   ALT 20 22 21 20   ALKPHOS 89 83 66 68  BILITOT 1.4* 0.8 0.8 0.3  PROT 5.8* 5.6* 4.7* 4.6*  ALBUMIN 1.8* 1.8* 1.5* 1.5*   No results for input(s):  LIPASE, AMYLASE in the last 168 hours. No results for input(s): AMMONIA in the last 168 hours. Coagulation Profile: No results for input(s): INR, PROTIME in the last 168 hours. Cardiac Enzymes: No results for input(s): CKTOTAL, CKMB, CKMBINDEX, TROPONINI in the last 168 hours. BNP (last 3 results) No results for input(s): PROBNP in the last 8760 hours. HbA1C: No results for input(s): HGBA1C in the last 72 hours. CBG: No results for input(s): GLUCAP in the last 168 hours. Lipid Profile: No results for input(s): CHOL, HDL,  LDLCALC, TRIG, CHOLHDL, LDLDIRECT in the last 72 hours. Thyroid Function Tests: No results for input(s): TSH, T4TOTAL, FREET4, T3FREE, THYROIDAB in the last 72 hours. Anemia Panel: No results for input(s): VITAMINB12, FOLATE, FERRITIN, TIBC, IRON, RETICCTPCT in the last 72 hours. Sepsis Labs: Recent Labs  Lab 11/01/19 1129  PROCALCITON <0.10  LATICACIDVEN 1.1    Recent Results (from the past 240 hour(s))  Urine culture     Status: Abnormal   Collection Time: 11/01/19 11:29 AM   Specimen: Urine, Random  Result Value Ref Range Status   Specimen Description   Final    URINE, RANDOM Performed at [facility], [address], [place], [place] [address]    Special Requests   Final    NONE Performed at [facility], [address], [place], [place] [address]    Culture (A)  Final    <10,000 COLONIES/mL INSIGNIFICANT GROWTH Performed at [facility], [place] [address] [place], [place] [address]    Report Status 11/02/2019 FINAL  Final  Culture, blood (Routine x 2)     Status: None (Preliminary result)   Collection Time: 11/01/19 11:47 AM   Specimen: BLOOD  Result Value Ref Range Status   Specimen Description BLOOD RIGHT ANTECUBITAL  Final   Special Requests   Final    BOTTLES DRAWN AEROBIC AND ANAEROBIC Blood Culture results may not be optimal due to an excessive volume of blood received in culture bottles   Culture   Final    NO GROWTH 3 DAYS Performed at [facility], [address] [place], [place] [address]    Report Status PENDING  Incomplete  Culture, blood (Routine x 2)     Status: None (Preliminary result)   Collection Time: 11/01/19  1:28 PM   Specimen: BLOOD  Result Value Ref Range Status   Specimen Description BLOOD LEFT ANTECUBITAL  Final   Special Requests   Final    BOTTLES DRAWN AEROBIC AND ANAEROBIC Blood Culture adequate volume   Culture   Final    NO GROWTH 3 DAYS Performed at [facility], [address] [place], [place] [address]    Report Status PENDING  Incomplete  Respiratory Panel by RT PCR (Flu A&B, Covid) - Nasopharyngeal Swab     Status: Abnormal   Collection Time: 11/01/19  3:27 PM   Specimen: Nasopharyngeal Swab  Result Value Ref Range Status   SARS Coronavirus 2 by RT PCR POSITIVE (A) NEGATIVE Final    Comment: RESULT CALLED TO, READ BACK BY AND VERIFIED WITH: TUMEY AT 1730 ON 11/01/2019 BY MOSLEY,J (NOTE) SARS-CoV-2 target nucleic acids are DETECTED. SARS-CoV-2 RNA is generally detectable in upper respiratory specimens  during the acute phase of infection. Positive results are indicative of the presence of the identified virus, but do not rule out bacterial infection or co-infection with other pathogens not detected by the test. Clinical correlation with patient history and other diagnostic information is necessary to  determine patient infection status. The expected result is Negative. Fact Sheet for Patients:  PinkCheek.be Fact Sheet for Healthcare Providers: GravelBags.it This test is not yet approved or cleared by the [place] FDA and  has been authorized for detection and/or diagnosis of SARS-CoV-2 by FDA under an Emergency Use Authorization (EUA).  This EUA will remain in effect (meaning this test can be used)  for the duration of  the COVID-19 declaration under Section 564(b)(1) of the Act, 21 U.S.C. section 360bbb-3(b)(1), unless the authorization is terminated or revoked sooner.    Influenza A by PCR NEGATIVE NEGATIVE Final   Influenza B by PCR NEGATIVE NEGATIVE Final    Comment: (NOTE) The Xpert Xpress SARS-CoV-2/FLU/RSV assay is intended as an aid in  the diagnosis of influenza from Nasopharyngeal swab specimens and  should not be used as a sole basis for treatment. Nasal washings and  aspirates are unacceptable for Xpert Xpress SARS-CoV-2/FLU/RSV  testing. Fact Sheet for  Patients: PinkCheek.be Fact Sheet for Healthcare Providers: GravelBags.it This test is not yet approved or cleared by the [place] FDA and  has been authorized for detection and/or diagnosis of SARS-CoV-2 by  FDA under an Emergency Use Authorization (EUA). This EUA will remain  in effect (meaning this test can be used) for the duration of the  Covid-19 declaration under Section 564(b)(1) of the Act, 21  U.S.C. section 360bbb-3(b)(1), unless the authorization is  terminated or revoked. Performed at [facility], [address] [place], [place] [address]      Radiology Studies: No results found.  Scheduled Meds: . amiodarone  200 mg Oral QHS  . carvedilol  3.125 mg Oral BID  . Chlorhexidine Gluconate Cloth  6 each Topical Daily  . dexamethasone  6 mg Oral q1800  . enalapril  2.5 mg Oral QHS  . feeding supplement (ENSURE ENLIVE)  237 mL Oral BID BM  . finasteride  5 mg Oral Daily  . isosorbide mononitrate  30 mg Oral QHS  . LORazepam  1 mg Intravenous Once  . memantine  10 mg Oral BID  . mirtazapine  15 mg Oral QHS  . rosuvastatin  40 mg Oral q1800  . tamsulosin  0.4 mg Oral Daily   Continuous Infusions: . lactated ringers Stopped (11/04/19 1305)     LOS: 3 days   Time spent: 40 minutes  Lorella Nimrod, MD [facility]  If 7PM-7AM, please contact night-coverage [email or web address]  11/04/2019, 1:50 PM   This record has been created using Systems analyst. Errors have been sought and corrected,but may not always be located. Such creation errors do not reflect on the standard of care.

## 2019-11-05 DIAGNOSIS — E860 Dehydration: Secondary | ICD-10-CM

## 2019-11-05 DIAGNOSIS — I959 Hypotension, unspecified: Secondary | ICD-10-CM

## 2019-11-05 LAB — CBC WITH DIFFERENTIAL/PLATELET
Abs Immature Granulocytes: 0.13 10*3/uL — ABNORMAL HIGH (ref 0.00–0.07)
Basophils Absolute: 0 10*3/uL (ref 0.0–0.1)
Basophils Relative: 0 %
Eosinophils Absolute: 0 10*3/uL (ref 0.0–0.5)
Eosinophils Relative: 0 %
HCT: 29.8 % — ABNORMAL LOW (ref 39.0–52.0)
Hemoglobin: 9.4 g/dL — ABNORMAL LOW (ref 13.0–17.0)
Immature Granulocytes: 2 %
Lymphocytes Relative: 9 %
Lymphs Abs: 0.7 10*3/uL (ref 0.7–4.0)
MCH: 30.4 pg (ref 26.0–34.0)
MCHC: 31.5 g/dL (ref 30.0–36.0)
MCV: 96.4 fL (ref 80.0–100.0)
Monocytes Absolute: 0.2 10*3/uL (ref 0.1–1.0)
Monocytes Relative: 3 %
Neutro Abs: 7.1 10*3/uL (ref 1.7–7.7)
Neutrophils Relative %: 86 %
Platelets: 187 10*3/uL (ref 150–400)
RBC: 3.09 MIL/uL — ABNORMAL LOW (ref 4.22–5.81)
RDW: 14.6 % (ref 11.5–15.5)
WBC: 8.2 10*3/uL (ref 4.0–10.5)
nRBC: 0 % (ref 0.0–0.2)

## 2019-11-05 LAB — COMPREHENSIVE METABOLIC PANEL
ALT: 20 U/L (ref 0–44)
AST: 24 U/L (ref 15–41)
Albumin: 1.8 g/dL — ABNORMAL LOW (ref 3.5–5.0)
Alkaline Phosphatase: 79 U/L (ref 38–126)
Anion gap: 5 (ref 5–15)
BUN: 18 mg/dL (ref 8–23)
CO2: 29 mmol/L (ref 22–32)
Calcium: 7.7 mg/dL — ABNORMAL LOW (ref 8.9–10.3)
Chloride: 103 mmol/L (ref 98–111)
Creatinine, Ser: 0.81 mg/dL (ref 0.61–1.24)
GFR calc Af Amer: >60 mL/min (ref >60)
GFR calc non Af Amer: >60 mL/min (ref >60)
Glucose, Bld: 130 mg/dL — ABNORMAL HIGH (ref 70–99)
Potassium: 4.6 mmol/L (ref 3.5–5.1)
Sodium: 137 mmol/L (ref 135–145)
Total Bilirubin: 0.6 mg/dL (ref 0.3–1.2)
Total Protein: 5 g/dL — ABNORMAL LOW (ref 6.5–8.1)

## 2019-11-05 LAB — BPAM RBC
Blood Product Expiration Date: 202103012359
ISSUE DATE / TIME: 202102071317
Unit Type and Rh: 5100

## 2019-11-05 LAB — TYPE AND SCREEN
ABO/RH(D): O POS
Antibody Screen: NEGATIVE
Unit division: 0

## 2019-11-05 LAB — C-REACTIVE PROTEIN: CRP: 0.6 mg/dL (ref <1.0)

## 2019-11-05 MED ORDER — CARVEDILOL 3.125 MG PO TABS: 3.1250 mg | ORAL_TABLET | Freq: Two times a day (BID) | ORAL | Status: AC

## 2019-11-05 MED ORDER — POLYETHYLENE GLYCOL 3350 17 G PO PACK: 17.0000 g | PACK | Freq: Every day | ORAL | 0 refills | Status: DC | PRN

## 2019-11-05 MED ORDER — BISACODYL 10 MG RE SUPP: 10.0000 mg | Freq: Every day | RECTAL | 0 refills | Status: DC | PRN

## 2019-11-05 NOTE — Progress Notes (Signed)
PT Cancellation Note  Patient Details Name: Chris Simon MRN: [id number] DOB: 07/28/1934   Cancelled Treatment:    Reason Eval/Treat Not Completed: Other (comment). Pt lethargic upon arrival lying in stool. RN notified for assist in hygiene and bed change. Pt unable to participate in therapy other than squeezing hands. Pt very confused and thinks he's at home. Will try to evaluate another time.   Evalyne Cortopassi 11/05/2019, 9:17 AM Greggory Stallion, PT, DPT [phone]

## 2019-11-05 NOTE — NC FL2 (Signed)
[place] LEVEL OF CARE SCREENING TOOL     IDENTIFICATION  Patient Name: Chris Simon Birthdate: Dec 13, 1933 Sex: male Admission Date (Current Location): 11/01/2019  [place] and [place] Number:  Engineering geologist and Address:  [facility], [address], [place], [place] [address]      Provider Number: [id number]  Attending Physician Name and Address:  Lorella Nimrod, MD  Relative Name and Phone Number:       Current Level of Care: Hospital Recommended Level of Care: [place] Prior Approval Number:    Date Approved/Denied:   PASRR Number: [id number]  Discharge Plan: SNF    Current Diagnoses: Patient Active Problem List   Diagnosis Date Noted  . Dehydration   . Hypotension   . Pneumonia due to COVID-19 virus 11/01/2019  . COPD (chronic obstructive pulmonary disease) ([place])   . Acute respiratory failure with hypoxia ([place]) 10/22/2019  . Chronic indwelling Foley catheter   . Alzheimer's dementia without behavioral disturbance ([place])   . Pneumonia 02/20/2017  . TIA (transient ischemic attack) 02/18/2017  . Left inguinal hernia 06/22/2016  . Umbilical hernia without obstruction and without gangrene 06/22/2016  . Calculus of kidney 09/03/2015  . Testicular cyst 09/03/2015  . Sepsis secondary to UTI ([place]) 07/20/2015  . BPH (benign prostatic hyperplasia) 07/18/2015  . Elevated PSA 07/18/2015  . Sepsis ([place]) 07/09/2015  . Acute kidney injury ([place])   . Paroxysmal A-fib ([place])   . Lower urinary tract infectious disease   . Atrial fibrillation ([place]) 10/01/2014  . Pure hypercholesterolemia 10/01/2014  . Benign fibroma of prostate 10/06/2012  . Bladder retention 10/06/2012    Orientation RESPIRATION BLADDER Height & Weight     (disoriented)  O2 Indwelling catheter, Incontinent Weight: 74.8 kg Height:  6' (182.9 cm)  BEHAVIORAL SYMPTOMS/MOOD NEUROLOGICAL BOWEL NUTRITION STATUS      Continent Diet(2 gram sodium)   AMBULATORY STATUS COMMUNICATION OF NEEDS Skin   Limited Assist Verbally Normal                       Personal Care Assistance Level of Assistance  Bathing, Feeding, Dressing Bathing Assistance: Limited assistance Feeding assistance: Independent Dressing Assistance: Limited assistance     Functional Limitations Info  Sight, Hearing, Speech Sight Info: Adequate Hearing Info: Adequate Speech Info: Adequate    SPECIAL CARE FACTORS FREQUENCY  PT (By licensed PT), OT (By licensed OT)     PT Frequency: 5 times per week OT Frequency: 5 times per week            Contractures Contractures Info: Not present    Additional Factors Info  Code Status, Allergies, Isolation Precautions Code Status Info: full code Allergies Info: clindamycin, Sulfa ABX           Current Medications (11/05/2019):  This is the current hospital active medication list Current Facility-Administered Medications  Medication Dose Route Frequency Provider Last Rate Last Admin  . amiodarone (PACERONE) tablet 200 mg  200 mg Oral QHS Bonnell Public Tublu, MD   200 mg at 11/04/19 2238  . bisacodyl (DULCOLAX) suppository 10 mg  10 mg Rectal Daily PRN Bonnell Public Tublu, MD      . carvedilol (COREG) tablet 3.125 mg  3.125 mg Oral BID Lorella Nimrod, MD   3.125 mg at 11/05/19 1041  . Chlorhexidine Gluconate Cloth 2 % PADS 6 each  6 each Topical Daily Lorella Nimrod, MD   6 each at 11/04/19 0950  .  dexamethasone (DECADRON) tablet 6 mg  6 mg Oral q1800 Bonnell Public Tublu, MD   6 mg at 11/04/19 1757  . enalapril (VASOTEC) tablet 2.5 mg  2.5 mg Oral QHS Lorella Nimrod, MD   2.5 mg at 11/04/19 2238  . feeding supplement (ENSURE ENLIVE) (ENSURE ENLIVE) liquid 237 mL  237 mL Oral BID BM Bonnell Public Tublu, MD   237 mL at 11/05/19 1045  . finasteride (PROSCAR) tablet 5 mg  5 mg Oral Daily Bonnell Public Tublu, MD   5 mg at 11/05/19 1041  . isosorbide mononitrate (IMDUR) 24 hr tablet 30 mg  30  mg Oral QHS Lorella Nimrod, MD   30 mg at 11/04/19 2238  . lactated ringers infusion   Intravenous Continuous Lorella Nimrod, MD 75 mL/hr at 11/05/19 1107 New Bag at 11/05/19 1107  . LORazepam (ATIVAN) injection 1 mg  1 mg Intravenous Once Lang Snow, NP      . memantine [facility]) tablet 10 mg  10 mg Oral BID Bonnell Public Tublu, MD   10 mg at 11/05/19 1041  . mirtazapine (REMERON) tablet 15 mg  15 mg Oral QHS Bonnell Public Tublu, MD   15 mg at 11/04/19 2238  . polyethylene glycol (MIRALAX / GLYCOLAX) packet 17 g  17 g Oral Daily PRN Bonnell Public Tublu, MD      . rosuvastatin (CRESTOR) tablet 40 mg  40 mg Oral q1800 Vashti Hey, MD   40 mg at 11/04/19 2236  . tamsulosin (FLOMAX) capsule 0.4 mg  0.4 mg Oral Daily Bonnell Public Tublu, MD   0.4 mg at 11/05/19 1041     Discharge Medications: Please see discharge summary for a list of discharge medications.  Relevant Imaging Results:  Relevant Lab Results:   Additional Information [id number]  Su Hilt, RN

## 2019-11-05 NOTE — TOC Transition Note (Signed)
Transition of Care [facility]) - CM/SW Discharge Note   Patient Details  Name: Chris Simon MRN: [id number] Date of Birth: May 12, 1934  [facility] CM/SW Contact:  Su Hilt, RN Phone Number: 11/05/2019, 12:33 PM   Clinical Narrative:     Patient to return to [facility] today, Daughter [name] has been notified via phone call, DC packet on the chart Magda Paganini from [facility] is aware and stated they are ready to take him back,  The bed side nurse will call report to [facility] and call EMS for transport, DC packet on the chart        Patient Goals and CMS Choice        Discharge Placement              Patient chooses bed at: [facility] Patient to be transferred to facility by: EMS Name of family member notified: Daughter [name] Patient and family notified of of transfer: 11/05/19  Discharge Plan and Services                                     Social Determinants of Health (SDOH) Interventions     Readmission Risk Interventions No flowsheet data found.

## 2019-11-05 NOTE — Progress Notes (Signed)
Called report to Beulah Gandy at [facility]

## 2019-11-05 NOTE — Discharge Summary (Signed)
Physician Discharge Summary  Chris Simon [id number] DOB: [birth year] DOA: 11/01/2019  PCP: [name], MD  Admit date: 11/01/2019 Discharge date: 11/05/2019  Admitted From: SNF Disposition:  SNF  Recommendations for Outpatient Follow-up:  1. Follow up with PCP in 1-2 weeks 2. Follow-up with urology in 1 to 2 weeks. 3. Please obtain BMP/CBC in one week 4. Please follow up on the following pending results:None  Home Health:No Equipment/Devices: None  Discharge Condition: Stable CODE STATUS: Partial/DNI Diet recommendation: Heart Healthy   Brief/Interim Summary: Chris Simon an 86 y.o.malepast medical history significant for dementia, BPH with urinary retention requiring chronic Foley catheter, HTN, A. fib, COPD who was brought in from urology clinic for hypotension and weakness. On arrival patient was hypotensive with blood pressure of 69/76, tachypneic tachycardic, meeting sepsis criteria and resuscitated with IV fluids and started on broad-spectrum antibiotics.  Procalcitonin was negative.  Patient has chronic Foley with recent infection with E. coli ESBL. He was also recently treated for Covid pneumonia in December 2020 at [facility] and discharged to rehab after prolonged hospitalization.  Another admission with recurrent hypoxic respiratory failure and multifocal pneumonia, he was treated with antibiotics although procalcitonin was negative, improved and return back to [facility]. During current hospitalization he was initially treated with ertapenem, urine and blood cultures were negative-dependent was discontinued.  UA most likely secondary to colonization.  Urine was also concerning for yeast which is also most likely colonization and no need to treat according to ID.  Patient also had gross hematuria secondary to traumatic injury with pulling of Foley by himself.  Continue to manipulate with his genitalia requiring mittens to prevent another traumatic injury.  She  did develop clogged Foley requiring changed x2 during current hospitalization.  Urology was consulted and they instructed to hold Eliquis and continue with irrigation.  His urine becomes clear.  His hemoglobin dropped to 7.6 from 10, he was given 1 unit of packed RBCs and hemoglobin on discharge was 9.4. Discussed with daughter again and decided to keep holding Eliquis.  Daughter will talk with his cardiologist and can resume it if needed.  He is high risk for rebleeding as he is keep pulling his catheter due to advanced dementia.  Patient needs to follow-up with urology for chronic Foley for further management. If he needs to continue with Foley, might be a candidate for suprapubic catheter to prevent further urethral injuries.  Chest x-ray with continuation of bilateral pulmonary infiltrate with a small pneumo and hydrothorax which does not need treatment.  Most likely secondary to post Covid pneumonitis.  Echocardiogram was within normal limit.  He was started on Decadron 4 mg daily and will continue with a taper.  Small pneumothorax was thought to be due to a rupture of a small bleb due to his underlying emphysema.  We continued home dose of Imdur and enalapril and decrease the dose of carvedilol due to concern of some bradycardia.  Patient did develop some delirium secondary to dementia.  He will continue home dose of donepezil, memantine and Remeron.  Patient remained rate controlled with A. fib.  We continue it amiodarone with a low-dose of carvedilol.  Eliquis was held due to hematuria.  Again discussed with daughter who is also his POA regarding his CODE STATUS.  Daughter insists that she wants CPR without any intubation despite explaining multiple times.  Discharging him as partial code with DNI. Needs further discussion by PCP.  Discharge Diagnoses:  Principal Problem:   Sepsis (  [place]) Active Problems:   Lower urinary tract infectious disease   Atrial fibrillation (HCC)   Bladder  retention   Acute respiratory failure with hypoxia (HCC)   Chronic indwelling Foley catheter   Alzheimer's dementia without behavioral disturbance ([place])   Pneumonia due to COVID-19 virus   COPD (chronic obstructive pulmonary disease) (HCC)  Discharge Instructions  Discharge Instructions    Diet - low sodium heart healthy   Complete by: As directed    Discharge instructions   Complete by: As directed    Patient needs to follow-up with urology for further management of catheter. We are holding Eliquis due to gross hematuria secondary to urethral injury as patient keep pulling his catheter. Discussed with daughter and she will follow up with his cardiologist-he can resume if they decide to but keep in mind that he is high risk for bleeding due to his advanced dementia and keep pulling his catheter.   Increase activity slowly   Complete by: As directed      Allergies as of 11/05/2019      Reactions   Clindamycin Hcl Rash   Unknown reaction  Reaction: Unknown  Unknown reaction    Sulfamethoxazole-trimethoprim Rash      Medication List    STOP taking these medications   apixaban 5 MG Tabs tablet Commonly known as: ELIQUIS     TAKE these medications   albuterol 108 (90 Base) MCG/ACT inhaler Commonly known as: VENTOLIN HFA Inhale 1-2 puffs into the lungs every 4 (four) hours as needed for wheezing or shortness of breath.   amiodarone 200 MG tablet Commonly known as: PACERONE Take 200 mg by mouth at bedtime.   B-12 2500 MCG Tabs Take 2,500 mg by mouth daily.   bisacodyl 10 MG suppository Commonly known as: DULCOLAX Place 1 suppository (10 mg total) rectally daily as needed for moderate constipation.   carvedilol 3.125 MG tablet Commonly known as: COREG Take 1 tablet (3.125 mg total) by mouth 2 (two) times daily. What changed:   medication strength  how much to take   donepezil 10 MG tablet Commonly known as: ARICEPT Take 10 mg by mouth at bedtime.   enalapril  2.5 MG tablet Commonly known as: VASOTEC Take 2.5 mg by mouth at bedtime.   feeding supplement (ENSURE ENLIVE) Liqd Take 237 mLs by mouth 2 (two) times daily between meals.   finasteride 5 MG tablet Commonly known as: PROSCAR Take 1 tablet (5 mg total) by mouth daily.   furosemide 20 MG tablet Commonly known as: LASIX Take 20 mg by mouth daily as needed for edema. >3lbs in 24 hours   isosorbide mononitrate 30 MG 24 hr tablet Commonly known as: IMDUR Take 30 mg by mouth at bedtime.   loratadine 10 MG tablet Commonly known as: CLARITIN Take 10 mg by mouth daily as needed for allergies.   memantine 10 MG tablet Commonly known as: NAMENDA Take 10 mg by mouth 2 (two) times daily.   mirtazapine 15 MG tablet Commonly known as: REMERON Take 15 mg by mouth at bedtime.   multivitamin with minerals Tabs tablet Take 1 tablet by mouth daily.   polyethylene glycol 17 g packet Commonly known as: MIRALAX / GLYCOLAX Take 17 g by mouth daily as needed for mild constipation.   rosuvastatin 40 MG tablet Commonly known as: CRESTOR Take 40 mg by mouth at bedtime.   tamsulosin 0.4 MG Caps capsule Commonly known as: FLOMAX Take 1 capsule (0.4 mg total) by mouth daily.  Allergies  Allergen Reactions  . Clindamycin Hcl Rash    Unknown reaction  Reaction: Unknown  Unknown reaction   . Sulfamethoxazole-Trimethoprim Rash    Consultations: Urology  ID  Procedures/Studies: DG Chest 1 View  Result Date: 11/01/2019 CLINICAL DATA:  Hypotension today. History of COPD. EXAM: CHEST  1 VIEW COMPARISON:  Single-view of the chest 10/22/2019 and 02/19/2017. FINDINGS: Extensive multifocal airspace disease seen on the comparison examination persists. There is a new small left pneumothorax estimated at 15-20%. The right lung is expanded. Heart size is normal. No acute or focal bony abnormality. IMPRESSION: 1. New small left pneumothorax estimated at 15-20%. 2. No change in extensive  bilateral airspace disease. 3. Critical Value/emergent results were called by telephone at the time of interpretation on 11/01/2019 at 12:25 pm to provider PHILLIP STAFFORD , who verbally acknowledged these results. Electronically Signed   By: Inge Rise M.D.   On: 11/01/2019 12:27   CT Chest Wo Contrast  Result Date: 11/01/2019 CLINICAL DATA:  Hypotension, pneumonia, abnormal chest x-ray EXAM: CT CHEST WITHOUT CONTRAST TECHNIQUE: Multidetector CT imaging of the chest was performed following the standard protocol without IV contrast. COMPARISON:  Chest x-ray 11/01/2019, 10/22/2019; CT 09/17/2013 FINDINGS: Cardiovascular: Heart size is normal. No pericardial effusion. Thoracic aorta is nonaneurysmal. Extensive atherosclerotic calcifications of the aorta and coronary arteries. Mediastinum/Nodes: Enlarged thyroid gland extending into the superior mediastinum, similar to prior CT. Trachea and esophagus within normal limits. No axillary, mediastinal, or hilar lymphadenopathy. Lungs/Pleura: Small left-sided pneumothorax involving approximately 5% of the lung volume with left apical and small anterior components including associated layering pleural fluid compatible with hydropneumothorax. There is a tiny right lateral pneumothorax (series 3, image 60) also with loculated pleural fluid. Moderate left and small right pleural effusions. Extensive multifocal airspace consolidations scattered throughout both lungs compatible with multifocal pneumonia there is mild-to-moderate centrilobular and paraseptal emphysema. Scattered small pulmonary nodules bilaterally. Upper Abdomen: No acute findings within the visualized portion of the upper abdomen. Colonic diverticulosis. Small amount of pneumobilia, presumably related to post cholecystectomy changes. Musculoskeletal: Prior cement augmentation related to vertebral body compression fracture at T12. No acute osseous abnormality or chest wall lesion. IMPRESSION: 1. Small left  hydropneumothorax involving approximately 5% of the left lung volume. 2. Tiny right-sided hydropneumothorax involving less than 5% of the right lung volume. 3. Extensive multifocal airspace consolidations scattered throughout both lungs compatible with multifocal pneumonia. Radiographic follow-up to resolution is recommended to exclude underlying malignancy. 4. Moderate left and small right pleural effusions. 5. Enlarged thyroid gland extending into the superior mediastinum, similar to prior CT. Aortic Atherosclerosis (ICD10-I70.0) and Emphysema (ICD10-J43.9). These results were called by telephone at the time of interpretation on 11/01/2019 at 2:22 pm to provider PHILLIP STAFFORD , who verbally acknowledged these results. Electronically Signed   By: Davina Poke D.O.   On: 11/01/2019 14:24   DG Chest Portable 1 View  Result Date: 10/22/2019 CLINICAL DATA:  Cough and possible hypoxia.  Dementia. EXAM: PORTABLE CHEST 1 VIEW COMPARISON:  02/19/2017 FINDINGS: Lungs are adequately inflated demonstrate patchy bilateral airspace consolidation. No evidence of effusion. Cardiac silhouette is unremarkable. Mild prominence of the right hilum likely due to superimposed parenchymal disease. Remainder of the exam is unchanged. IMPRESSION: Bilateral patchy multifocal airspace process likely multifocal pneumonia. Recommend follow-up to resolution. Electronically Signed   By: Marin Olp M.D.   On: 10/22/2019 08:59   ECHOCARDIOGRAM COMPLETE  Result Date: 11/02/2019   ECHOCARDIOGRAM REPORT   Patient Name:   [facility] Date of Exam: 11/02/2019 Medical Rec #:  [id number]         Height:       72.0 in Accession #:    [id number]        Weight:       165.0 lb Date of Birth:  14-Mar-1934         BSA:          1.96 m Patient Age:    86 years          BP:           97/44 mmHg Patient Gender: M                 HR:           63 bpm. Exam Location:  [facility] Procedure: 2D Echo, Color Doppler and Cardiac Doppler Indications:      I50.31 CHF-Acute Diastolic  History:         Patient has prior history of Echocardiogram examinations. COPD,                  Arrythmias:Atrial Fibrillation; Risk Factors:Dyslipidemia. Pt                  tested positive for novel coronavirus on 11/01/2019.  Sonographer:     Charmayne Sheer RDCS (AE) Referring Phys:  [id number] [place] Diagnosing Phys: Bartholome Bill MD  Sonographer Comments: Suboptimal apical window and no subcostal window. TDS due to pt position w/ hands clasped on chest. IMPRESSIONS  1. Left ventricular ejection fraction, by visual estimation, is 55 to 60%. The left ventricle has normal function. Left ventricular septal wall thickness was normal. Normal left ventricular posterior wall thickness. There is no left ventricular hypertrophy.  2. The left ventricle has no regional wall motion abnormalities.  3. Global right ventricle has normal systolic function.The right ventricular size is normal. No increase in right ventricular wall thickness.  4. Left atrial size was normal.  5. Right atrial size was normal.  6. The mitral valve is grossly normal. Trivial mitral valve regurgitation.  7. The tricuspid valve is grossly normal.  8. The tricuspid valve is grossly normal. Tricuspid valve regurgitation is mild.  9. The aortic valve was not well visualized. Aortic valve regurgitation is not visualized. Mild aortic valve sclerosis without stenosis. 10. The pulmonic valve was not well visualized. Pulmonic valve regurgitation is not visualized. 11. The aortic root was not well visualized. 12. The interatrial septum was not assessed. FINDINGS  Left Ventricle: Left ventricular ejection fraction, by visual estimation, is 55 to 60%. The left ventricle has normal function. The left ventricle has no regional wall motion abnormalities. Normal left ventricular posterior wall thickness. There is no left ventricular hypertrophy. Right Ventricle: The right ventricular size is normal. No increase in right  ventricular wall thickness. Global RV systolic function is has normal systolic function. Left Atrium: Left atrial size was normal in size. Right Atrium: Right atrial size was normal in size Pericardium: There is no evidence of pericardial effusion. Mitral Valve: The mitral valve is grossly normal. Trivial mitral valve regurgitation. MV peak gradient, 3.4 mmHg. Tricuspid Valve: The tricuspid valve is grossly normal. Tricuspid valve regurgitation is mild. Aortic Valve: The aortic valve was not well visualized. Aortic valve regurgitation is not visualized. Mild aortic valve sclerosis is present, with no evidence of aortic valve stenosis. Aortic valve mean gradient measures 1.0 mmHg. Aortic valve peak gradient measures 3.0 mmHg. Aortic valve area, by VTI  measures 3.97 cm. Pulmonic Valve: The pulmonic valve was not well visualized. Pulmonic valve regurgitation is not visualized. Pulmonic regurgitation is not visualized. Aorta: The aortic root was not well visualized and the aortic root is normal in size and structure. IAS/Shunts: The interatrial septum was not assessed.  LEFT VENTRICLE PLAX 2D LVIDd:         5.13 cm  Diastology LVIDs:         3.50 cm  LV e' lateral:   8.92 cm/s LV PW:         0.81 cm  LV E/e' lateral: 8.4 LV IVS:        0.63 cm  LV e' medial:    8.38 cm/s LVOT diam:     2.30 cm  LV E/e' medial:  8.9 LV SV:         75 ml LV SV Index:   38.31 LVOT Area:     4.15 cm  LEFT ATRIUM         Index LA diam:    3.20 cm 1.63 cm/m  AORTIC VALVE                   PULMONIC VALVE AV Area (Vmax):    3.49 cm    PV Vmax:       1.08 m/s AV Area (Vmean):   3.69 cm    PV Vmean:      64.900 cm/s AV Area (VTI):     3.97 cm    PV VTI:        0.209 m AV Vmax:           86.10 cm/s  PV Peak grad:  4.7 mmHg AV Vmean:          55.600 cm/s PV Mean grad:  2.0 mmHg AV VTI:            0.201 m AV Peak Grad:      3.0 mmHg AV Mean Grad:      1.0 mmHg LVOT Vmax:         72.40 cm/s LVOT Vmean:        49.400 cm/s LVOT VTI:           0.192 m LVOT/AV VTI ratio: 0.96  AORTA Ao Root diam: 3.60 cm MITRAL VALVE MV Area (PHT): 2.32 cm             SHUNTS MV Peak grad:  3.4 mmHg             Systemic VTI:  0.19 m MV Mean grad:  2.0 mmHg             Systemic Diam: 2.30 cm MV Vmax:       0.92 m/s MV Vmean:      60.0 cm/s MV VTI:        0.30 m MV PHT:        94.83 msec MV Decel Time: 327 msec MV E velocity: 74.70 cm/s 103 cm/s MV A velocity: 85.40 cm/s 70.3 cm/s MV E/A ratio:  0.87       1.5  Bartholome Bill MD Electronically signed by Bartholome Bill MD Signature Date/Time: 11/02/2019/1:48:40 PM    Final    Subjective: Patient was feeling better when seen this morning.  No new complaints.  Discharge Exam: Vitals:   11/05/19 0044 11/05/19 1039  BP: 126/69 109/62  Pulse: (!) 58 60  Resp: 18 16  Temp: 97.6 F (36.4 C) (!) 97.5 F (36.4 C)  SpO2: 93%  93%   Vitals:   11/04/19 1559 11/04/19 1620 11/05/19 0044 11/05/19 1039  BP: 114/64 123/61 126/69 109/62  Pulse: 60 (!) 58 (!) 58 60  Resp: 18 18 18 16   Temp: (!) 97.3 F (36.3 C) 97.7 F (36.5 C) 97.6 F (36.4 C) (!) 97.5 F (36.4 C)  TempSrc: Axillary Oral  Oral  SpO2: 94% 94% 93% 93%  Weight:      Height:        General: Pt is alert, awake, not in acute distress Cardiovascular: RRR, S1/S2 +, no rubs, no gallops Respiratory: CTA bilaterally, no wheezing, no rhonchi Abdominal: Soft, NT, ND, bowel sounds + Extremities: no edema, no cyanosis   The results of significant diagnostics from this hospitalization (including imaging, microbiology, ancillary and laboratory) are listed below for reference.    Microbiology: Recent Results (from the past 240 hour(s))  Urine culture     Status: Abnormal   Collection Time: 11/01/19 11:29 AM   Specimen: Urine, Random  Result Value Ref Range Status   Specimen Description   Final    URINE, RANDOM Performed at [facility], [address], [place], [place] [address]    Special Requests   Final    NONE Performed at [facility], [address] [place], [place] [address]    Culture (A)  Final    <10,000 COLONIES/mL INSIGNIFICANT GROWTH Performed at [facility], [place] [address], [place], [place] [address]    Report Status 11/02/2019 FINAL  Final  Culture, blood (Routine x 2)     Status: None (Preliminary result)   Collection Time: 11/01/19 11:47 AM   Specimen: BLOOD  Result Value Ref Range Status   Specimen Description BLOOD RIGHT ANTECUBITAL  Final   Special Requests   Final    BOTTLES DRAWN AEROBIC AND ANAEROBIC Blood Culture results may not be optimal due to an excessive volume of blood received in culture bottles   Culture   Final    NO GROWTH 4 DAYS Performed at [facility], [address], [place], [place] [address]    Report Status PENDING  Incomplete  Culture, blood (Routine x 2)     Status: None (Preliminary result)   Collection Time: 11/01/19  1:28 PM   Specimen: BLOOD  Result Value Ref Range Status   Specimen Description BLOOD LEFT ANTECUBITAL  Final   Special Requests   Final    BOTTLES DRAWN AEROBIC AND ANAEROBIC Blood Culture adequate volume   Culture   Final    NO GROWTH 4 DAYS Performed at [facility], [address] [place], [place] [address]    Report Status PENDING  Incomplete  Respiratory Panel by RT PCR (Flu A&B, Covid) - Nasopharyngeal Swab     Status: Abnormal   Collection Time: 11/01/19  3:27 PM   Specimen: Nasopharyngeal Swab  Result Value Ref Range Status   SARS Coronavirus 2 by RT PCR POSITIVE (A) NEGATIVE Final    Comment: RESULT CALLED TO, READ BACK BY AND VERIFIED WITH: TUMEY AT 1730 ON 11/01/2019 BY MOSLEY,J (NOTE) SARS-CoV-2 target nucleic acids are DETECTED. SARS-CoV-2 RNA is generally detectable in upper respiratory specimens  during the acute phase of infection. Positive results are indicative of the presence of the identified virus, but do not rule out bacterial infection or co-infection with other pathogens not detected  by the test. Clinical correlation with patient history and other diagnostic information is necessary to determine patient infection status. The expected result  is Negative. Fact Sheet for Patients:  PinkCheek.be Fact Sheet for Healthcare Providers: GravelBags.it This test is not yet approved or cleared by the [place] FDA and  has been authorized for detection and/or diagnosis of SARS-CoV-2 by FDA under an Emergency Use Authorization (EUA).  This EUA will remain in effect (meaning this test can be used)  for the duration of  the COVID-19 declaration under Section 564(b)(1) of the Act, 21 U.S.C. section 360bbb-3(b)(1), unless the authorization is terminated or revoked sooner.    Influenza A by PCR NEGATIVE NEGATIVE Final   Influenza B by PCR NEGATIVE NEGATIVE Final    Comment: (NOTE) The Xpert Xpress SARS-CoV-2/FLU/RSV assay is intended as an aid in  the diagnosis of influenza from Nasopharyngeal swab specimens and  should not be used as a sole basis for treatment. Nasal washings and  aspirates are unacceptable for Xpert Xpress SARS-CoV-2/FLU/RSV  testing. Fact Sheet for Patients: PinkCheek.be Fact Sheet for Healthcare Providers: GravelBags.it This test is not yet approved or cleared by the [place] FDA and  has been authorized for detection and/or diagnosis of SARS-CoV-2 by  FDA under an Emergency Use Authorization (EUA). This EUA will remain  in effect (meaning this test can be used) for the duration of the  Covid-19 declaration under Section 564(b)(1) of the Act, 21  U.S.C. section 360bbb-3(b)(1), unless the authorization is  terminated or revoked. Performed at [facility], [address] [place], [place] [address]      Labs: BNP (last 3 results) No results for input(s): BNP in the last 8760 hours. Basic Metabolic Panel: Recent Labs  Lab  11/01/19 1129 11/02/19 0302 11/03/19 0530 11/04/19 0801 11/05/19 0145  NA 148* 146* 148* 142 137  K 3.9 4.1 4.1 4.9 4.6  CL 112* 114* 115* 109 103  CO2 28 27 27 29 29   GLUCOSE 101* 109* 95 154* 130*  BUN 21 19 16 18 18   CREATININE 1.32* 1.06 1.00 0.90 0.81  CALCIUM 7.9* 7.6* 7.7* 7.5* 7.7*   Liver Function Tests: Recent Labs  Lab 11/01/19 1129 11/02/19 0302 11/03/19 0530 11/04/19 0801 11/05/19 0145  AST 25 26 24 22 24   ALT 20 22 21 20 20   ALKPHOS 89 83 66 68 79  BILITOT 1.4* 0.8 0.8 0.3 0.6  PROT 5.8* 5.6* 4.7* 4.6* 5.0*  ALBUMIN 1.8* 1.8* 1.5* 1.5* 1.8*   No results for input(s): LIPASE, AMYLASE in the last 168 hours. No results for input(s): AMMONIA in the last 168 hours. CBC: Recent Labs  Lab 11/01/19 1129 11/01/19 1129 11/02/19 0302 11/03/19 0530 11/04/19 0801 11/04/19 1910 11/05/19 0145  WBC 10.6*   < > 10.5 8.6 4.5 7.3 8.2  NEUTROABS 7.6  --  9.2* 6.8 3.7  --  7.1  HGB 10.0*   < > 10.1* 8.7* 7.6* 9.7* 9.4*  HCT 34.1*   < > 34.2* 28.9* 25.5* 30.7* 29.8*  MCV 102.1*   < > 101.5* 100.3* 99.6 97.8 96.4  PLT 291   < > 250 223 187 199 187   < > = values in this interval not displayed.   Cardiac Enzymes: No results for input(s): CKTOTAL, CKMB, CKMBINDEX, TROPONINI in the last 168 hours. BNP: Invalid input(s): POCBNP CBG: No results for input(s): GLUCAP in the last 168 hours. D-Dimer No results for input(s): DDIMER in the last 72 hours. Hgb A1c No results for input(s): HGBA1C in the last 72 hours. Lipid Profile No results for input(s): CHOL, HDL, LDLCALC, TRIG, CHOLHDL, LDLDIRECT in  the last 72 hours. Thyroid function studies No results for input(s): TSH, T4TOTAL, T3FREE, THYROIDAB in the last 72 hours.  Invalid input(s): FREET3 Anemia work up No results for input(s): VITAMINB12, FOLATE, FERRITIN, TIBC, IRON, RETICCTPCT in the last 72 hours. Urinalysis    Component Value Date/Time   COLORURINE AMBER (A) 11/01/2019 1129   APPEARANCEUR HAZY (A)  11/01/2019 1129   APPEARANCEUR Cloudy (A) 07/05/2019 0857   LABSPEC 1.016 11/01/2019 1129   LABSPEC 1.019 06/21/2014 1800   PHURINE 6.0 11/01/2019 1129   GLUCOSEU NEGATIVE 11/01/2019 1129   GLUCOSEU Negative 06/21/2014 1800   HGBUR MODERATE (A) 11/01/2019 1129   BILIRUBINUR NEGATIVE 11/01/2019 1129   BILIRUBINUR Negative 07/05/2019 0857   BILIRUBINUR Negative 06/21/2014 1800   KETONESUR NEGATIVE 11/01/2019 1129   PROTEINUR 30 (A) 11/01/2019 1129   NITRITE NEGATIVE 11/01/2019 1129   LEUKOCYTESUR SMALL (A) 11/01/2019 1129   LEUKOCYTESUR Trace 06/21/2014 1800   Sepsis Labs Invalid input(s): PROCALCITONIN,  WBC,  LACTICIDVEN Microbiology Recent Results (from the past 240 hour(s))  Urine culture     Status: Abnormal   Collection Time: 11/01/19 11:29 AM   Specimen: Urine, Random  Result Value Ref Range Status   Specimen Description   Final    URINE, RANDOM Performed at [facility], [address], [place], [place] [address]    Special Requests   Final    NONE Performed at [facility] [facility], [address] [place], [place] [address]    Culture (A)  Final    <10,000 COLONIES/mL INSIGNIFICANT GROWTH Performed at [facility], [place] [address], [place], [place] [address]    Report Status 11/02/2019 FINAL  Final  Culture, blood (Routine x 2)     Status: None (Preliminary result)   Collection Time: 11/01/19 11:47 AM   Specimen: BLOOD  Result Value Ref Range Status   Specimen Description BLOOD RIGHT ANTECUBITAL  Final   Special Requests   Final    BOTTLES DRAWN AEROBIC AND ANAEROBIC Blood Culture results may not be optimal due to an excessive volume of blood received in culture bottles   Culture   Final    NO GROWTH 4 DAYS Performed at [facility], [address] [place], [place] [address]    Report Status PENDING  Incomplete  Culture, blood (Routine x 2)     Status: None (Preliminary result)   Collection Time: 11/01/19  1:28 PM   Specimen:  BLOOD  Result Value Ref Range Status   Specimen Description BLOOD LEFT ANTECUBITAL  Final   Special Requests   Final    BOTTLES DRAWN AEROBIC AND ANAEROBIC Blood Culture adequate volume   Culture   Final    NO GROWTH 4 DAYS Performed at [facility], [address], [place], [place] [address]    Report Status PENDING  Incomplete  Respiratory Panel by RT PCR (Flu A&B, Covid) - Nasopharyngeal Swab     Status: Abnormal   Collection Time: 11/01/19  3:27 PM   Specimen: Nasopharyngeal Swab  Result Value Ref Range Status   SARS Coronavirus 2 by RT PCR POSITIVE (A) NEGATIVE Final    Comment: RESULT CALLED TO, READ BACK BY AND VERIFIED WITH: TUMEY AT 1730 ON 11/01/2019 BY MOSLEY,J (NOTE) SARS-CoV-2 target nucleic acids are DETECTED. SARS-CoV-2 RNA is generally detectable in upper respiratory specimens  during the acute phase of infection. Positive results are indicative of the presence of the identified virus, but do not rule out bacterial infection or co-infection  with other pathogens not detected by the test. Clinical correlation with patient history and other diagnostic information is necessary to determine patient infection status. The expected result is Negative. Fact Sheet for Patients:  PinkCheek.be Fact Sheet for Healthcare Providers: GravelBags.it This test is not yet approved or cleared by the [place] FDA and  has been authorized for detection and/or diagnosis of SARS-CoV-2 by FDA under an Emergency Use Authorization (EUA).  This EUA will remain in effect (meaning this test can be used)  for the duration of  the COVID-19 declaration under Section 564(b)(1) of the Act, 21 U.S.C. section 360bbb-3(b)(1), unless the authorization is terminated or revoked sooner.    Influenza A by PCR NEGATIVE NEGATIVE Final   Influenza B by PCR NEGATIVE NEGATIVE Final    Comment: (NOTE) The Xpert Xpress SARS-CoV-2/FLU/RSV  assay is intended as an aid in  the diagnosis of influenza from Nasopharyngeal swab specimens and  should not be used as a sole basis for treatment. Nasal washings and  aspirates are unacceptable for Xpert Xpress SARS-CoV-2/FLU/RSV  testing. Fact Sheet for Patients: PinkCheek.be Fact Sheet for Healthcare Providers: GravelBags.it This test is not yet approved or cleared by the [place] FDA and  has been authorized for detection and/or diagnosis of SARS-CoV-2 by  FDA under an Emergency Use Authorization (EUA). This EUA will remain  in effect (meaning this test can be used) for the duration of the  Covid-19 declaration under Section 564(b)(1) of the Act, 21  U.S.C. section 360bbb-3(b)(1), unless the authorization is  terminated or revoked. Performed at [facility], [address] [place], [place] [address]     Time coordinating discharge: Over 30 minutes  SIGNED:  Lorella Nimrod, MD  [facility] 11/05/2019, 12:15 PM  If 7PM-7AM, please contact night-coverage [email or web address]  This record has been created using Systems analyst. Errors have been sought and corrected,but may not always be located. Such creation errors do not reflect on the standard of care.

## 2019-11-05 NOTE — Plan of Care (Signed)
  Problem: Education: Goal: Knowledge of risk factors and measures for prevention of condition will improve Outcome: Progressing   Problem: Coping: Goal: Psychosocial and spiritual needs will be supported Outcome: Progressing   Problem: Respiratory: Goal: Will maintain a patent airway Outcome: Progressing Goal: Complications related to the disease process, condition or treatment will be avoided or minimized Outcome: Progressing   Problem: Clinical Measurements: Goal: Ability to maintain clinical measurements within normal limits will improve Outcome: Progressing

## 2019-11-06 ENCOUNTER — Telehealth: Payer: Self-pay | Admitting: Physician Assistant

## 2019-11-06 LAB — CULTURE, BLOOD (ROUTINE X 2)
Culture: NO GROWTH
Culture: NO GROWTH
Special Requests: ADEQUATE

## 2019-11-06 NOTE — Telephone Encounter (Signed)
I just spoke with the patient's daughter, [name], via telephone.  She reports that she followed up with cardiology today and was advised that he could continue to hold Eliquis until he passes a voiding trial.  He is scheduled for voiding trial with me next week.  I am in agreement with this plan.

## 2019-11-06 NOTE — Telephone Encounter (Signed)
Pt daughter called office concerned about her father and him having a catheter while on blood thinner, states father was sent to ER from our office last week, was told he needs to stop blood thinner while he has cath but pt is at risk of stroke. Daughter would like to speak to you about what's going on and how to proceed. Please call her at [phone]

## 2019-11-13 ENCOUNTER — Ambulatory Visit (INDEPENDENT_AMBULATORY_CARE_PROVIDER_SITE_OTHER): Payer: Medicare HMO | Admitting: Physician Assistant

## 2019-11-13 ENCOUNTER — Other Ambulatory Visit: Payer: Self-pay

## 2019-11-13 ENCOUNTER — Ambulatory Visit: Payer: Medicare HMO | Admitting: Physician Assistant

## 2019-11-13 VITALS — BP 89/50 | HR 144

## 2019-11-13 DIAGNOSIS — R339 Retention of urine, unspecified: Secondary | ICD-10-CM | POA: Diagnosis not present

## 2019-11-13 LAB — BLADDER SCAN AMB NON-IMAGING

## 2019-11-13 NOTE — Progress Notes (Signed)
Fill and Pull Catheter Removal  Patient is present today for a catheter removal.  Patient was cleaned and prepped in a sterile fashion 358ml of sterile water/ saline was instilled into the bladder when the patient felt the urge to urinate. 82ml of water was then drained from the balloon.  A 20FR foley cath was removed from the bladder no complications were noted .  Patient as then given some time to void on their own.  Patient cannot void. Patient tolerated well.  Performed by: Alva Garnet & Desteny Freeman,CMA  Follow up/ Additional notes: This afternoon.

## 2019-11-13 NOTE — Progress Notes (Signed)
11/13/2019 5:24 PM   Chris Simon 12-Jan-1934 [id number]  CC: Voiding trial  HPI: Chris Simon is a 86 y.o. male with PMH dementia, hypertension, A. fib, COPD, BPH on Flomax and finasteride, and multiple recent hospitalizations with COVID-19 pneumonia and sequelae including development of gross hematuria and acute urinary retention who presents today for voiding trial.  He originally presented to clinic for voiding trial on 11/01/2019, however he was referred to the emergency room due to hypotension and hypoxic respiratory failure requiring inpatient admission for management of sepsis and multifocal pneumonia.  Patient is somnolent today and does not contribute much to HPI.  He is accompanied in clinic by his daughter Chris Simon.  PMH: Past Medical History:  Diagnosis Date  . Aneurysm ([place])    Abdominal Aortic Aneurysm  . Atrial fibrillation ([place])   . Blockage of a bile duct   . BPH with obstruction/lower urinary tract symptoms   . Cellulitis of scrotum   . COPD (chronic obstructive pulmonary disease) ([place])   . Dementia ([place])   . Diverticulosis   . Dyspnea    with exertion  . Dysrhythmia   . Elevated PSA   . Epididymitis   . Gallstone   . Gross hematuria   . History of kidney stones   . Hyperlipidemia   . Kidney stones   . Nocturia   . Testicular pain   . TIA (transient ischemic attack)    June 2018  . Urinary retention     Surgical History: Past Surgical History:  Procedure Laterality Date  . CHOLECYSTECTOMY N/A 07/11/2015   Procedure: LAPAROSCOPIC CHOLECYSTECTOMY;  Surgeon: Rolm Bookbinder, MD;  Location: [facility];  Service: General;  Laterality: N/A;  . ERCP N/A 07/09/2015   Procedure: ENDOSCOPIC RETROGRADE CHOLANGIOPANCREATOGRAPHY (ERCP);  Surgeon: Clarene Essex, MD;  Location: [facility];  Service: Endoscopy;  Laterality: N/A;  . INGUINAL HERNIA REPAIR Left 04/07/2017   Procedure: HERNIA REPAIR INGUINAL ADULT;  Surgeon: Clayburn Pert, MD;  Location:  [facility];  Service: General;  Laterality: Left;  . INSERTION OF MESH Left 04/07/2017   Procedure: INSERTION OF MESH;  Surgeon: Clayburn Pert, MD;  Location: [facility];  Service: General;  Laterality: Left;  . KYPHOPLASTY N/A 11/16/2018   Procedure: KYPHOPLASTY T12;  Surgeon: Hessie Knows, MD;  Location: [facility];  Service: Orthopedics;  Laterality: N/A;  . LEFT HEART CATH AND CORONARY ANGIOGRAPHY N/A 01/10/2018   Procedure: LEFT HEART CATH AND CORONARY ANGIOGRAPHY;  Surgeon: Dionisio David, MD;  Location: [facility];  Service: Cardiovascular;  Laterality: N/A;    Home Medications:  Allergies as of 11/13/2019      Reactions   Clindamycin Hcl Rash   Unknown reaction  Reaction: Unknown  Unknown reaction    Sulfamethoxazole-trimethoprim Rash      Medication List       Accurate as of November 13, 2019 11:59 PM. If you have any questions, ask your nurse or doctor.        albuterol 108 (90 Base) MCG/ACT inhaler Commonly known as: VENTOLIN HFA Inhale 1-2 puffs into the lungs every 4 (four) hours as needed for wheezing or shortness of breath.   amiodarone 200 MG tablet Commonly known as: PACERONE Take 200 mg by mouth at bedtime.   B-12 2500 MCG Tabs Take 2,500 mg by mouth daily.   bisacodyl 10 MG suppository Commonly known as: DULCOLAX Place 1 suppository (10 mg total) rectally daily as needed for moderate constipation.   carvedilol 3.125 MG tablet  Commonly known as: COREG Take 1 tablet (3.125 mg total) by mouth 2 (two) times daily.   donepezil 10 MG tablet Commonly known as: ARICEPT Take 10 mg by mouth at bedtime.   enalapril 2.5 MG tablet Commonly known as: VASOTEC Take 2.5 mg by mouth at bedtime.   feeding supplement (ENSURE ENLIVE) Liqd Take 237 mLs by mouth 2 (two) times daily between meals.   finasteride 5 MG tablet Commonly known as: PROSCAR Take 1 tablet (5 mg total) by mouth daily.   furosemide 20 MG tablet Commonly known as: LASIX Take 20 mg  by mouth daily as needed for edema. >3lbs in 24 hours   isosorbide mononitrate 30 MG 24 hr tablet Commonly known as: IMDUR Take 30 mg by mouth at bedtime.   loratadine 10 MG tablet Commonly known as: CLARITIN Take 10 mg by mouth daily as needed for allergies.   memantine 10 MG tablet Commonly known as: NAMENDA Take 10 mg by mouth 2 (two) times daily.   mirtazapine 15 MG tablet Commonly known as: REMERON Take 15 mg by mouth at bedtime.   multivitamin with minerals Tabs tablet Take 1 tablet by mouth daily.   polyethylene glycol 17 g packet Commonly known as: MIRALAX / GLYCOLAX Take 17 g by mouth daily as needed for mild constipation.   rosuvastatin 40 MG tablet Commonly known as: CRESTOR Take 40 mg by mouth at bedtime.   tamsulosin 0.4 MG Caps capsule Commonly known as: FLOMAX Take 1 capsule (0.4 mg total) by mouth daily.       Allergies:  Allergies  Allergen Reactions  . Clindamycin Hcl Rash    Unknown reaction  Reaction: Unknown  Unknown reaction   . Sulfamethoxazole-Trimethoprim Rash    Family History: Family History  Problem Relation Age of Onset  . Prostate cancer Brother   . Kidney disease Neg Hx   . Bladder Cancer Neg Hx     Social History:   reports that he has quit smoking. His smoking use included cigarettes. He smoked 0.50 packs per day. He has never used smokeless tobacco. He reports that he does not drink alcohol or use drugs.  Physical Exam: BP (!) 89/50 (BP Location: Left Arm, Patient Position: Sitting, Cuff Size: Normal)   Pulse (!) 144   Constitutional: Somnolent, frail appearing, no acute distress, nontoxic appearing HEENT: [place], AT Cardiovascular: No clubbing, cyanosis, or edema Respiratory: Normal respiratory effort, no increased work of breathing, nasal cannula in place on supplemental oxygen Skin: No rashes, bruises or suspicious lesions  Laboratory Data: Results for orders placed or performed in visit on 11/13/19  Bladder Scan  (Post Void Residual) in office  Result Value Ref Range   Scan Result 572mL    Assessment & Plan:   1. Urinary retention We proceeded with voiding trial today.  See separate procedure notes for more details.  In the afternoon, patient return to clinic with elevated PVR once again.  He reported he had not urinated during the day.  Voiding trial failed.  I am concerned about his frailty and somnolence following his recent hospitalizations and the implications of these on his ability to pass a voiding trial at this time.  I had a lengthy conversation with Chris Simon today regarding our management options moving forward.  I recommended that we defer a voiding trial in approximately 10 days and plan for second voiding trial in 1 month instead.  This will give him more time to recuperate following his recent illness.  I counseled  Chris Simon that we could change this to a simple catheter exchange at that visit if he remains weak.  She expressed understanding. - Bladder Scan (Post Void Residual) in office  Return in about 4 weeks (around 12/11/2019) for Voiding trial.  Debroah Loop, PA-C  [facility] [address], [place], [place] [address] [phone]

## 2019-11-13 NOTE — Progress Notes (Signed)
Simple Catheter Placement  Due to urinary retention patient is present today for a foley cath placement.  Patient was cleaned and prepped in a sterile fashion with betadine. A 18 coude FR foley catheter was inserted, urine return was noted  39ml, urine was amber in color.  The balloon was filled with 10cc of sterile water.  A night bag was attached for drainage.  Patient tolerated well, no complications were noted   Performed by: Fonnie Jarvis, CMA  Additional notes/ Follow up: 1 month

## 2019-11-22 ENCOUNTER — Telehealth: Payer: Self-pay

## 2019-11-22 NOTE — Telephone Encounter (Signed)
Apolonio Schneiders from [facility] called stating that the patient is now under their care starting 11-20-19. She wanted to know if patient needed to keep his foley or have it exchanged. Per Sam's last note patient is to keep cath in for a month and then discuss if a voiding trial would be attempted again or if a foley would be exchanged. Apolonio Schneiders is unsure if they are able to preform a voiding trial she will contact her supervisor and call us back. If unable to give orders to do at patient's home would recommend keeping appointment here at the office.

## 2019-11-23 NOTE — Telephone Encounter (Signed)
Nurse returns call from Hospice. She states that they are unable to do a voiding trial at Hospice or at the patient's home. The nurse states that the patient should be able to attend his upcoming appointment unless something drastic happens with the patient's condition at which time they would call and inform us. Pt's daughter is planning to attend appt with him.

## 2019-11-23 NOTE — Telephone Encounter (Signed)
We previously decided to defer a repeat voiding trial for 1 month due to his weakness.  I was concerned that his poor functional status when I last saw him would limit his ability to pass another voiding trial.  If he is not expected to meaningfully recover at this point, it would be very appropriate to plan for chronic Foley catheterization instead.  In that case, I fully support hospice care performing monthly catheter exchanges at home to save him visits to our clinic.  I am happy to defer to the patient and his daughter's wishes on this.

## 2019-11-23 NOTE — Telephone Encounter (Signed)
Hospice is able to do monthly cath care and changes, the patient and hospice were under the impression he was in need of a voiding trial. If that is not the case I'm happy to call the daughter and inform her of this.

## 2019-11-23 NOTE — Telephone Encounter (Signed)
Is hospice able to do monthly Foley changes at home? I would be fine with this if it would make things easier for the patient. Otherwise happy to see him for monthly catheter exchanges in clinic.

## 2019-11-26 ENCOUNTER — Telehealth: Payer: Self-pay | Admitting: Urology

## 2019-11-26 NOTE — Telephone Encounter (Signed)
Yes, that's fine 

## 2019-11-26 NOTE — Telephone Encounter (Signed)
Spoke with patient's daughter and notified her that it was ok to pick up Myrbetriq samples per Sam. Waiting on hospice to return call in reference to foley removal and replacement

## 2019-11-26 NOTE — Telephone Encounter (Signed)
Pt's daughter [facility] that pt came home from [facility] and is now under hospice care.  He has been burning really bad with urination.  Pt has a catheter.  She would like to speak with someone about him.

## 2019-11-26 NOTE — Telephone Encounter (Signed)
Previously addressed.

## 2019-11-26 NOTE — Telephone Encounter (Signed)
Spoke with patient's daughter and she would like for the voiding trial to be done at home due to patient's poor ambulation and fatigue. She states patient has been complaining of urgency and burning, it was explained that this is bladder spasms due to the foley in place

## 2019-11-28 NOTE — Telephone Encounter (Signed)
Attempted to reach [facility] with Hospice left vmail for supervisor to return call in regards to foley removal at home with possible replacement [phone])

## 2019-12-10 NOTE — Telephone Encounter (Signed)
Attempted to reach [facility] with Hospice left vmail for supervisor to return call in regards to foley removal at home with possible replacement [phone])

## 2019-12-10 NOTE — Telephone Encounter (Signed)
Spoke with Hospice manager and patient's insurance will not pay for nurse to stay at patient's home for a voiding trial. Spoke with patient's daughter she is willing to remove foley for patient in the AM by cutting the tube for balloon deflating. Hospice was given a verbal order and faxed written order to In and Out cath patient at 2pm and if residual is above 271ml 16 fr coude foley is to be replaced  Fax number [phone]

## 2019-12-11 ENCOUNTER — Ambulatory Visit: Payer: Self-pay | Admitting: Physician Assistant

## 2019-12-17 ENCOUNTER — Telehealth: Payer: Self-pay

## 2019-12-17 NOTE — Telephone Encounter (Signed)
Chris Simon from [facility] called. Needs verbal order to change cath every 4-6 weeks and as needed. As per Sam PA change catheter every 4 weeks. Rachel aware.

## 2020-01-08 ENCOUNTER — Telehealth: Payer: Self-pay | Admitting: *Deleted

## 2020-01-08 NOTE — Telephone Encounter (Signed)
Daughter called in today and Ibhan wants his cath out today.

## 2020-01-08 NOTE — Telephone Encounter (Signed)
Talked with sam about Chris Simon. Sam states they could take the cathter out and try  to let him urinate, than scan his bladder. If he is not  able to urinate than the cathter has to be replaced. Sam offered for them to do a in and out  cath him 3-to 4 times daily.  Talked with daughter and she states she will take to her father about this.

## 2020-01-11 ENCOUNTER — Telehealth: Payer: Self-pay | Admitting: *Deleted

## 2020-01-11 MED ORDER — MIRABEGRON ER 25 MG PO TB24: 25.0000 mg | ORAL_TABLET | Freq: Every day | ORAL | 5 refills | Status: DC

## 2020-01-11 NOTE — Telephone Encounter (Signed)
Sent Myrbetriq 25 mg in per Sam.

## 2020-01-11 NOTE — Telephone Encounter (Signed)
Almyra Free (nurse) called about patient. She just wanted to let us know that Mr. Nabozny daughter states he started a low grade fever yesterday. Patient is not having any urinary symptoms.

## 2020-01-11 NOTE — Telephone Encounter (Signed)
Talked to daughter today, Daughter states that her dad is having some burning pain. His fever was 100.00 last night and today is 99.0. Urine color was dark yellow yesterday and today its light yellow.

## 2020-01-14 ENCOUNTER — Telehealth: Payer: Self-pay

## 2020-01-14 NOTE — Telephone Encounter (Signed)
Patient's daughter called stating that patient pulled out his foley on Friday. Patient is still with Hospice, Almyra Free [phone]) is his current nurse. Patient's daughter also states patient still has a low grade fever of 100.6. Called to give orders for catheter replacement as well as urine collection for culture due to fever. Left a message for Almyra Free to return call to give verbal orders per Inocente Salles

## 2020-01-14 NOTE — Telephone Encounter (Signed)
Spoke with Almyra Free at [facility] and she was given verbal orders to replace patient's foley today and to collect a UA for culture to rule out infection. She was asked to fax culture results to our office attention Sam for review. She verbalized order read back. Patient's daughter was also notified of this

## 2020-01-15 NOTE — Telephone Encounter (Signed)
Chris Simon from Hospice called back stating she did place foley yesterday but no UA was obtained. Patient does not have fever ok to go with out UA. Per Sam ok to cancel UA order

## 2020-01-28 ENCOUNTER — Telehealth: Payer: Self-pay | Admitting: Physician Assistant

## 2020-01-28 NOTE — Telephone Encounter (Signed)
Chris Simon from [facility] called the office today.  Patient's daughter is insisting that the patient has a UTI.  Chris Simon wanted to let us know that she is going to collect a urine specimen and will send it to the lab for urinalysis and culture.

## 2020-01-28 NOTE — Telephone Encounter (Signed)
Left a message with [place] and spoke with daughter-states patient has been increasingly confused, and has some hematuria ongoing. Denies fevers. Foley was changed on 01/14/20. Stated Almyra Free from [facility] was supposed to collect UA this morning. Please advise.

## 2020-01-28 NOTE — Telephone Encounter (Signed)
I am sure the UA is going to look suspicious on the culture will grow something is at this point, the patient is likely chronically colonized.  Confusion alone can be from multiple causes and a positive urine culture is nonspecific.  If they feel strongly about getting a UA/urine culture, that is fine and we can treat and see if his confusion gets better.  If it does not, there is likely something else going on.  Hollice Espy, MD

## 2020-01-28 NOTE — Telephone Encounter (Signed)
Daughter informed-Julie from Hospice returned call will get UA/UCX-  Tomorrow morning. Called hospice office [phone] She voiced understanding. Daughter-Carolyn wanted to know if he needs to keep foley or can he try a voiding trial in the future? She states he feels the urge to urinate.

## 2020-01-28 NOTE — Telephone Encounter (Signed)
Sure, looks like he hasn't been here for voiding trial in a while.  Please schedule one with Sam next week or can be done by hospice if they are able to replace if unable to void.   Hollice Espy, MD

## 2020-01-29 NOTE — Telephone Encounter (Signed)
Patient is coming in to see Sam on 02/04/2020 for voiding trial

## 2020-02-04 ENCOUNTER — Other Ambulatory Visit: Payer: Self-pay

## 2020-02-04 ENCOUNTER — Ambulatory Visit (INDEPENDENT_AMBULATORY_CARE_PROVIDER_SITE_OTHER): Payer: Medicare HMO | Admitting: Physician Assistant

## 2020-02-04 ENCOUNTER — Ambulatory Visit: Payer: Self-pay | Admitting: Physician Assistant

## 2020-02-04 DIAGNOSIS — R339 Retention of urine, unspecified: Secondary | ICD-10-CM | POA: Diagnosis not present

## 2020-02-04 LAB — BLADDER SCAN AMB NON-IMAGING: Scan Result: 73

## 2020-02-04 NOTE — Progress Notes (Signed)
Fill and Pull Catheter Removal  Patient is present today for a catheter removal.  Patient was cleaned and prepped in a sterile fashion 387ml of sterile water was instilled into the bladder when the patient felt the urge to urinate. 34ml of water was then drained from the balloon.  A [id number] silicone foley cath was removed from the bladder no complications were noted .  Patient was then given some time to void on their own.  Patient can void  336ml on their own after some time.  Patient tolerated well.  Performed by: Debroah Loop, PA-C and Kerman Passey, RMA  Follow up/ Additional notes: Push fluids and RTC this afternoon for PVR.

## 2020-02-04 NOTE — Progress Notes (Signed)
02/04/2020 5:00 PM   Chris Simon November 29, 1933 [id number]  CC: Voiding trial  HPI: Chris Simon is a 86 y.o. comorbid male with a recent history of multiple hospitalizations with COVID-19 pneumonia and sequelae including element of Foley dependent urinary retention, now on hospice, here for voiding trial at his request.  He is accompanied today by his daughter [name], who contributes to HPI.  Patient and daughter report he has been doing quite well.  He reports he has been experiencing the urge to urinate recently and has been bothered by his Foley catheter.  They would like to attempt to remove it today.  Additionally, patient's daughter is concerned that he may have a urinary tract infection as his confusion has been slightly worse recently.  She states that hospice nursing has already sent off a UA for analysis.  PMH: Past Medical History:  Diagnosis Date  . Aneurysm ([place])    Abdominal Aortic Aneurysm  . Atrial fibrillation ([place])   . Blockage of a bile duct   . BPH with obstruction/lower urinary tract symptoms   . Cellulitis of scrotum   . COPD (chronic obstructive pulmonary disease) ([place])   . Dementia ([place])   . Diverticulosis   . Dyspnea    with exertion  . Dysrhythmia   . Elevated PSA   . Epididymitis   . Gallstone   . Gross hematuria   . History of kidney stones   . Hyperlipidemia   . Kidney stones   . Nocturia   . Testicular pain   . TIA (transient ischemic attack)    June 2018  . Urinary retention     Surgical History: Past Surgical History:  Procedure Laterality Date  . CHOLECYSTECTOMY N/A 07/11/2015   Procedure: LAPAROSCOPIC CHOLECYSTECTOMY;  Surgeon: Rolm Bookbinder, MD;  Location: [facility];  Service: General;  Laterality: N/A;  . ERCP N/A 07/09/2015   Procedure: ENDOSCOPIC RETROGRADE CHOLANGIOPANCREATOGRAPHY (ERCP);  Surgeon: Clarene Essex, MD;  Location: [facility] ENDOSCOPY;  Service: Endoscopy;  Laterality: N/A;  . INGUINAL HERNIA REPAIR Left  04/07/2017   Procedure: HERNIA REPAIR INGUINAL ADULT;  Surgeon: Clayburn Pert, MD;  Location: [facility] ORS;  Service: General;  Laterality: Left;  . INSERTION OF MESH Left 04/07/2017   Procedure: INSERTION OF MESH;  Surgeon: Clayburn Pert, MD;  Location: [facility] ORS;  Service: General;  Laterality: Left;  . KYPHOPLASTY N/A 11/16/2018   Procedure: KYPHOPLASTY T12;  Surgeon: Hessie Knows, MD;  Location: [facility] ORS;  Service: Orthopedics;  Laterality: N/A;  . LEFT HEART CATH AND CORONARY ANGIOGRAPHY N/A 01/10/2018   Procedure: LEFT HEART CATH AND CORONARY ANGIOGRAPHY;  Surgeon: Dionisio David, MD;  Location: [place] [facility];  Service: Cardiovascular;  Laterality: N/A;    Home Medications:  Allergies as of 02/04/2020      Reactions   Clindamycin Hcl Rash   Unknown reaction  Reaction: Unknown  Unknown reaction    Sulfamethoxazole-trimethoprim Rash      Medication List       Accurate as of Feb 04, 2020 11:59 PM. If you have any questions, ask your nurse or doctor.        albuterol 108 (90 Base) MCG/ACT inhaler Commonly known as: VENTOLIN HFA Inhale 1-2 puffs into the lungs every 4 (four) hours as needed for wheezing or shortness of breath.   amiodarone 200 MG tablet Commonly known as: PACERONE Take 200 mg by mouth at bedtime.   B-12 2500 MCG Tabs Take 2,500 mg by mouth daily.   bisacodyl  10 MG suppository Commonly known as: DULCOLAX Place 1 suppository (10 mg total) rectally daily as needed for moderate constipation.   carvedilol 3.125 MG tablet Commonly known as: COREG Take 1 tablet (3.125 mg total) by mouth 2 (two) times daily.   donepezil 10 MG tablet Commonly known as: ARICEPT Take 10 mg by mouth at bedtime.   enalapril 2.5 MG tablet Commonly known as: VASOTEC Take 2.5 mg by mouth at bedtime.   feeding supplement (ENSURE ENLIVE) Liqd Take 237 mLs by mouth 2 (two) times daily between meals.   finasteride 5 MG tablet Commonly known as: PROSCAR Take 1 tablet (5  mg total) by mouth daily.   furosemide 20 MG tablet Commonly known as: LASIX Take 20 mg by mouth daily as needed for edema. >3lbs in 24 hours   isosorbide mononitrate 30 MG 24 hr tablet Commonly known as: IMDUR Take 30 mg by mouth at bedtime.   loratadine 10 MG tablet Commonly known as: CLARITIN Take 10 mg by mouth daily as needed for allergies.   memantine 10 MG tablet Commonly known as: NAMENDA Take 10 mg by mouth 2 (two) times daily.   mirabegron ER 25 MG Tb24 tablet Commonly known as: MYRBETRIQ Take 1 tablet (25 mg total) by mouth daily.   mirtazapine 15 MG tablet Commonly known as: REMERON Take 15 mg by mouth at bedtime.   multivitamin with minerals Tabs tablet Take 1 tablet by mouth daily.   polyethylene glycol 17 g packet Commonly known as: MIRALAX / GLYCOLAX Take 17 g by mouth daily as needed for mild constipation.   rosuvastatin 40 MG tablet Commonly known as: CRESTOR Take 40 mg by mouth at bedtime.   tamsulosin 0.4 MG Caps capsule Commonly known as: FLOMAX Take 1 capsule (0.4 mg total) by mouth daily.       Allergies:  Allergies  Allergen Reactions  . Clindamycin Hcl Rash    Unknown reaction  Reaction: Unknown  Unknown reaction   . Sulfamethoxazole-Trimethoprim Rash    Family History: Family History  Problem Relation Age of Onset  . Prostate cancer Brother   . Kidney disease Neg Hx   . Bladder Cancer Neg Hx     Social History:   reports that he has quit smoking. His smoking use included cigarettes. He smoked 0.50 packs per day. He has never used smokeless tobacco. He reports that he does not drink alcohol or use drugs.  Physical Exam: There were no vitals taken for this visit.  Constitutional:  Alert and oriented, no acute distress, nontoxic appearing HEENT: [place], AT Cardiovascular: No clubbing, cyanosis, or edema Respiratory: Normal respiratory effort, no increased work of breathing Skin: No rashes, bruises or suspicious  lesions Neurologic: Grossly intact, no focal deficits, moving all 4 extremities Psychiatric: Normal mood and affect  Laboratory Data: Results for orders placed or performed in visit on 02/04/20  BLADDER SCAN AMB NON-IMAGING  Result Value Ref Range   Scan Result 73 ML    Assessment & Plan:   1. Urinary retention Proceeded with voiding trial in clinic today.  See separate procedure note for details.  Patient return to clinic this afternoon.  He reported having consumed approximately 30 ounces of water during the day and has been able to urinate.  Repeat PVR 73 mL.  Voiding trial passed.  Counseled patient to continue Flomax and finasteride. I counseled the patient on signs and symptoms of urinary retention, including lower abdominal pain, lumbar pain, abdominal distention, and the inability to urinate.  I advised him to contact the office for assistance if he develops these symptoms during routine office hours, 8 AM to 5 PM Monday through Friday.  If outside those hours, I advised him  to proceed to the emergency department. He expressed understanding.  - BLADDER SCAN AMB NON-IMAGING - BLADDER SCAN AMB NON-IMAGING  Return if symptoms worsen or fail to improve.  Debroah Loop, PA-C  [facility] [address] [place],  [address] [phone]

## 2020-02-07 ENCOUNTER — Telehealth: Payer: Self-pay

## 2020-02-07 ENCOUNTER — Other Ambulatory Visit: Payer: Self-pay

## 2020-02-07 MED ORDER — DOXYCYCLINE HYCLATE 100 MG PO CAPS
100.0000 mg | ORAL_CAPSULE | Freq: Two times a day (BID) | ORAL | 0 refills | Status: AC
Start: 2020-02-07 — End: 2020-02-14

## 2020-02-07 NOTE — Telephone Encounter (Signed)
I just spoke with the patient's daughter via telephone.  I explained that the faxed UA and culture sent to our office shows a positive urine culture with multidrug-resistant Morganella species.  I am prescribing doxycycline 100 mg twice daily x7 days for treatment of UTI.

## 2020-02-07 NOTE — Telephone Encounter (Signed)
Pt daughter left vm stating she left a vm this morning regarding urine specimen results please call her ASAP

## 2020-02-07 NOTE — Telephone Encounter (Signed)
Patient daughter called regarding fathers Urine from Hospice, stated the nurse told her it was abnormal and would like to discuss treatment

## 2020-02-20 ENCOUNTER — Other Ambulatory Visit: Payer: Self-pay

## 2020-02-20 ENCOUNTER — Encounter: Payer: Self-pay | Admitting: Urology

## 2020-02-20 ENCOUNTER — Ambulatory Visit: Payer: Medicare Other | Admitting: Urology

## 2020-02-20 VITALS — BP 93/59 | HR 64 | Ht 72.0 in | Wt 169.0 lb

## 2020-02-20 DIAGNOSIS — N138 Other obstructive and reflux uropathy: Secondary | ICD-10-CM | POA: Diagnosis not present

## 2020-02-20 DIAGNOSIS — R310 Gross hematuria: Secondary | ICD-10-CM | POA: Diagnosis not present

## 2020-02-20 DIAGNOSIS — R3912 Poor urinary stream: Secondary | ICD-10-CM | POA: Diagnosis not present

## 2020-02-20 DIAGNOSIS — N401 Enlarged prostate with lower urinary tract symptoms: Secondary | ICD-10-CM

## 2020-02-20 LAB — MICROSCOPIC EXAMINATION: RBC, Urine: >30 /hpf — AB (ref 0–2)

## 2020-02-20 LAB — BLADDER SCAN AMB NON-IMAGING: Scan Result: 34

## 2020-02-20 LAB — URINALYSIS, COMPLETE
Bilirubin, UA: NEGATIVE
Glucose, UA: NEGATIVE
Nitrite, UA: NEGATIVE
Specific Gravity, UA: >1.03 — ABNORMAL HIGH (ref 1.005–1.030)
Urobilinogen, Ur: 0.2 mg/dL (ref 0.2–1.0)
pH, UA: 5.5 (ref 5.0–7.5)

## 2020-02-20 NOTE — Patient Instructions (Signed)
Hematuria, Adult Hematuria is blood in the urine. Blood may be visible in the urine, or it may be identified with a test. This condition can be caused by infections of the bladder, urethra, kidney, or prostate. Other possible causes include:  Kidney stones.  Cancer of the urinary tract.  Too much calcium in the urine.  Conditions that are passed from parent to child (inherited conditions).  Exercise that requires a lot of energy. Infections can usually be treated with medicine, and a kidney stone usually will pass through your urine. If neither of these is the cause of your hematuria, more tests may be needed to identify the cause of your symptoms. It is very important to tell your health care provider about any blood in your urine, even if it is painless or the blood stops without treatment. Blood in the urine, when it happens and then stops and then happens again, can be a symptom of a very serious condition, including cancer. There is no pain in the initial stages of many urinary cancers. Follow these instructions at home: Medicines  Take over-the-counter and prescription medicines only as told by your health care provider.  If you were prescribed an antibiotic medicine, take it as told by your health care provider. Do not stop taking the antibiotic even if you start to feel better. Eating and drinking  Drink enough fluid to keep your urine clear or pale yellow. It is recommended that you drink 3-4 quarts (2.8-3.8 L) a day. If you have been diagnosed with an infection, it is recommended that you drink cranberry juice in addition to large amounts of water.  Avoid caffeine, tea, and carbonated beverages. These tend to irritate the bladder.  Avoid alcohol because it may irritate the prostate (men). General instructions  If you have been diagnosed with a kidney stone, follow your health care provider's instructions about straining your urine to catch the stone.  Empty your bladder  often. Avoid holding urine for long periods of time.  If you are male: ? After a bowel movement, wipe from front to back and use each piece of toilet paper only once. ? Empty your bladder before and after sex.  Pay attention to any changes in your symptoms. Tell your health care provider about any changes or any new symptoms.  It is your responsibility to get your test results. Ask your health care provider, or the department performing the test, when your results will be ready.  Keep all follow-up visits as told by your health care provider. This is important. Contact a health care provider if:  You develop back pain.  You have a fever.  You have nausea or vomiting.  Your symptoms do not improve after 3 days.  Your symptoms get worse. Get help right away if:  You develop severe vomiting and are unable take medicine without vomiting.  You develop severe pain in your back or abdomen even though you are taking medicine.  You pass a large amount of blood in your urine.  You pass blood clots in your urine.  You feel very weak or like you might faint.  You faint. Summary  Hematuria is blood in the urine. It has many possible causes.  It is very important that you tell your health care provider about any blood in your urine, even if it is painless or the blood stops without treatment.  Take over-the-counter and prescription medicines only as told by your health care provider.  Drink enough fluid to keep   your urine clear or pale yellow. This information is not intended to replace advice given to you by your health care provider. Make sure you discuss any questions you have with your health care provider. Document Revised: 02/07/2019 Document Reviewed: 10/16/2016 Elsevier Patient Education  2020 Elsevier Inc.  

## 2020-02-20 NOTE — Progress Notes (Signed)
02/20/2020 3:34 PM   Chris Simon 07/07/1934 [id number]  Referring provider: [name], MD [place],  [place] [address]  Chief Complaint  Patient presents with  . Hematuria    HPI:  Mr. Chris Simon presents to clinic today with bloody penile penile discharge. Started last night. He had this discharge coming out of his penis when he sat down. His daughter says it wasn't a lot and not bright red. No clots. He reports he is voiding well. PVR 34 ml. Also complaining of swelling in SP region.   GU Hx: 1) BPH --  on Flomax and finasteride; TRUS volume 234cc in 2015 on finasteride.   2) Urinary retention - recent history of multiple hospitalizations with COVID-19 pneumonia and sequelae including element of Foley dependent urinary retention Dec 2020, Feb 2021, now on hospice. He passed a void trial 02/04/2020. Prior retention in   3) Gross hematuria - gross hematuria during Feb 2021 hospital stay. Catheter irrigated normally.  He underwent cystoscopy on 06/05/2019 for gross hematuria which revealed a 7+ centimeter prostatic fossa which was friable and bleeding.  Moderate trabeculation of the bladder with saccules but no obvious papillary tumors or lesions. Last imaging Feb 2020 L-spine with pretty good imaging of the kidneys. CT scan on 06/2017 and 09/2018 shows evidence of prostamegaly with sequela of bladder outlet obstruction ([facility] reports). No renal mass or hydro. CT in  Oct 2016.    PMH: Past Medical History:  Diagnosis Date  . Aneurysm ([place])    Abdominal Aortic Aneurysm  . Atrial fibrillation ([place])   . Blockage of a bile duct   . BPH with obstruction/lower urinary tract symptoms   . Cellulitis of scrotum   . COPD (chronic obstructive pulmonary disease) ([place])   . Dementia ([place])   . Diverticulosis   . Dyspnea    with exertion  . Dysrhythmia   . Elevated PSA   . Epididymitis   . Gallstone   . Gross hematuria   . History of kidney stones   . Hyperlipidemia    . Kidney stones   . Nocturia   . Testicular pain   . TIA (transient ischemic attack)    June 2018  . Urinary retention     Surgical History: Past Surgical History:  Procedure Laterality Date  . CHOLECYSTECTOMY N/A 07/11/2015   Procedure: LAPAROSCOPIC CHOLECYSTECTOMY;  Surgeon: Rolm Bookbinder, MD;  Location: [facility];  Service: General;  Laterality: N/A;  . ERCP N/A 07/09/2015   Procedure: ENDOSCOPIC RETROGRADE CHOLANGIOPANCREATOGRAPHY (ERCP);  Surgeon: Clarene Essex, MD;  Location: [facility] ENDOSCOPY;  Service: Endoscopy;  Laterality: N/A;  . INGUINAL HERNIA REPAIR Left 04/07/2017   Procedure: HERNIA REPAIR INGUINAL ADULT;  Surgeon: Clayburn Pert, MD;  Location: [facility];  Service: General;  Laterality: Left;  . INSERTION OF MESH Left 04/07/2017   Procedure: INSERTION OF MESH;  Surgeon: Clayburn Pert, MD;  Location: [facility];  Service: General;  Laterality: Left;  . KYPHOPLASTY N/A 11/16/2018   Procedure: KYPHOPLASTY T12;  Surgeon: Hessie Knows, MD;  Location: [facility];  Service: Orthopedics;  Laterality: N/A;  . LEFT HEART CATH AND CORONARY ANGIOGRAPHY N/A 01/10/2018   Procedure: LEFT HEART CATH AND CORONARY ANGIOGRAPHY;  Surgeon: Dionisio David, MD;  Location: [facility];  Service: Cardiovascular;  Laterality: N/A;    Home Medications:  Allergies as of 02/20/2020      Reactions   Clindamycin Hcl Rash   Unknown reaction  Reaction: Unknown  Unknown reaction  Sulfamethoxazole-trimethoprim Rash      Medication List       Accurate as of Feb 20, 2020  3:34 PM. If you have any questions, ask your nurse or doctor.        albuterol 108 (90 Base) MCG/ACT inhaler Commonly known as: VENTOLIN HFA Inhale 1-2 puffs into the lungs every 4 (four) hours as needed for wheezing or shortness of breath.   amiodarone 200 MG tablet Commonly known as: PACERONE Take 200 mg by mouth at bedtime.   B-12 2500 MCG Tabs Take 2,500 mg by mouth daily.   bisacodyl 10 MG suppository  Commonly known as: DULCOLAX Place 1 suppository (10 mg total) rectally daily as needed for moderate constipation.   carvedilol 3.125 MG tablet Commonly known as: COREG Take 1 tablet (3.125 mg total) by mouth 2 (two) times daily.   donepezil 10 MG tablet Commonly known as: ARICEPT Take 10 mg by mouth at bedtime.   enalapril 2.5 MG tablet Commonly known as: VASOTEC Take 2.5 mg by mouth at bedtime.   feeding supplement (ENSURE ENLIVE) Liqd Take 237 mLs by mouth 2 (two) times daily between meals.   finasteride 5 MG tablet Commonly known as: PROSCAR Take 1 tablet (5 mg total) by mouth daily.   furosemide 20 MG tablet Commonly known as: LASIX Take 20 mg by mouth daily as needed for edema. >3lbs in 24 hours   isosorbide mononitrate 30 MG 24 hr tablet Commonly known as: IMDUR Take 30 mg by mouth at bedtime.   loratadine 10 MG tablet Commonly known as: CLARITIN Take 10 mg by mouth daily as needed for allergies.   memantine 10 MG tablet Commonly known as: NAMENDA Take 10 mg by mouth 2 (two) times daily.   mirabegron ER 25 MG Tb24 tablet Commonly known as: MYRBETRIQ Take 1 tablet (25 mg total) by mouth daily.   mirtazapine 15 MG tablet Commonly known as: REMERON Take 15 mg by mouth at bedtime.   multivitamin with minerals Tabs tablet Take 1 tablet by mouth daily.   polyethylene glycol 17 g packet Commonly known as: MIRALAX / GLYCOLAX Take 17 g by mouth daily as needed for mild constipation.   rosuvastatin 40 MG tablet Commonly known as: CRESTOR Take 40 mg by mouth at bedtime.   tamsulosin 0.4 MG Caps capsule Commonly known as: FLOMAX Take 1 capsule (0.4 mg total) by mouth daily.       Allergies:  Allergies  Allergen Reactions  . Clindamycin Hcl Rash    Unknown reaction  Reaction: Unknown  Unknown reaction   . Sulfamethoxazole-Trimethoprim Rash    Family History: Family History  Problem Relation Age of Onset  . Prostate cancer Brother   . Kidney  disease Neg Hx   . Bladder Cancer Neg Hx     Social History:  reports that he has quit smoking. His smoking use included cigarettes. He smoked 0.50 packs per day. He has never used smokeless tobacco. He reports that he does not drink alcohol or use drugs.   Physical Exam: There were no vitals taken for this visit.  Constitutional:  Alert and oriented, No acute distress. HEENT: [place] AT, moist mucus membranes.  Trachea midline, no masses. Cardiovascular: No clubbing, cyanosis, or edema. Respiratory: Normal respiratory effort, no increased work of breathing. GI: Abdomen is soft, nontender, nondistended, no abdominal masses. Mild edema of the upper thighs and SP area but no erythema. No signs of cellultis.  GU: No CVA tenderness Skin: No rashes, bruises or  suspicious lesions. Neurologic: Grossly intact, no focal deficits, moving all 4 extremities. Psychiatric: Normal mood and affect. GU: penis circ and appears normal. Testes palpably normal and scrotum normal. He did leak a few drops of urine from the meatus and it was crystal clear. Diaper wet and urine clear.   Laboratory Data: Lab Results  Component Value Date   WBC 8.2 11/05/2019   HGB 9.4 (L) 11/05/2019   HCT 29.8 (L) 11/05/2019   MCV 96.4 11/05/2019   PLT 187 11/05/2019    Lab Results  Component Value Date   CREATININE 0.81 11/05/2019    No results found for: PSA  No results found for: TESTOSTERONE  Lab Results  Component Value Date   HGBA1C 5.9 (H) 02/19/2017    Urinalysis    Component Value Date/Time   COLORURINE AMBER (A) 11/01/2019 1129   APPEARANCEUR HAZY (A) 11/01/2019 1129   APPEARANCEUR Cloudy (A) 07/05/2019 0857   LABSPEC 1.016 11/01/2019 1129   LABSPEC 1.019 06/21/2014 1800   PHURINE 6.0 11/01/2019 1129   GLUCOSEU NEGATIVE 11/01/2019 1129   GLUCOSEU Negative 06/21/2014 1800   HGBUR MODERATE (A) 11/01/2019 1129   BILIRUBINUR NEGATIVE 11/01/2019 1129   BILIRUBINUR Negative 07/05/2019 0857    BILIRUBINUR Negative 06/21/2014 1800   KETONESUR NEGATIVE 11/01/2019 1129   PROTEINUR 30 (A) 11/01/2019 1129   NITRITE NEGATIVE 11/01/2019 1129   LEUKOCYTESUR SMALL (A) 11/01/2019 1129   LEUKOCYTESUR Trace 06/21/2014 1800    Lab Results  Component Value Date   LABMICR See below: 07/05/2019   WBCUA >30 (A) 07/05/2019   RBCUA 0-2 05/02/2018   LABEPIT 0-10 07/05/2019   MUCUS Present (A) 05/02/2018   BACTERIA NONE SEEN 11/01/2019    Pertinent Imaging: CT 2016  Results for orders placed during the hospital encounter of 04/29/17  DG Abd 1 View   Narrative CLINICAL DATA:  Hematuria.  History of kidney stones.  EXAM: ABDOMEN - 1 VIEW  COMPARISON:  03/25/2016  FINDINGS: A 3 mm calculus projecting over the lower pole of the left kidney is unchanged. No definite right renal calculi or ureteral calculi are identified. A small calcification in the right pelvis is unchanged and compatible with a phlebolith. Prior cholecystectomy is noted. No acute osseous abnormality is seen. There is a nonobstructed bowel gas pattern.  IMPRESSION: Unchanged 3 mm left renal calculus.   Electronically Signed   By: Logan Bores M.D.   On: 04/29/2017 10:16    No results found for this or any previous visit. No results found for this or any previous visit. No results found for this or any previous visit. No results found for this or any previous visit. No results found for this or any previous visit. No results found for this or any previous visit. Results for orders placed during the hospital encounter of 07/08/15  CT RENAL STONE STUDY   Narrative CLINICAL DATA:  Left flank pain and hematuria  EXAM: CT ABDOMEN AND PELVIS WITHOUT CONTRAST  TECHNIQUE: Multidetector CT imaging of the abdomen and pelvis was performed following the standard protocol without IV contrast.  COMPARISON:  None.  FINDINGS: Lung bases are free of acute infiltrate or sizable effusion.  The liver is homogeneous  in attenuation without focal mass. Central periportal edema is identified as well as some dilatation of the intra and extrahepatic biliary tree. Multiple gallstones are noted within a distended gallbladder. Additionally, there are multiple calculi in the distal aspect of the common bile duct causing the dilatation.  The spleen, adrenal  glands and pancreas are otherwise within normal limits. The kidneys are well visualized bilaterally with nonobstructing renal calculus on the left measuring 4-5 mm. Additionally a cystic lesion is noted from the upper pole of the right kidney.  Diffuse aortoiliac calcifications are noted. Mild dilatation to 3.4 cm is noted. The appendix is within normal limits. There are diverticular changes in the colon although no findings to suggest diverticulitis are seen. A small left inguinal hernia is noted containing a portion of the sigmoid colon although no obstructive changes are seen.  The prostate is significantly enlarged measuring 8.8 x 7.6 cm. On the left lateral aspect of the prostate it appears distorted and the possibility of an underlying mass cannot be totally excluded. No definitive pelvic adenopathy is seen. The bladder demonstrates some increased trabeculation likely related to bladder outlet syndrome.  IMPRESSION: Enlarged prostate with changes suggestive of bladder outlet obstruction. The prostate is irregular particularly on its left lateral aspect which may represent an underlying mass lesion. Clinical correlation with the physical exam is recommended.  Small left inguinal hernia containing a loop of nonobstructed sigmoid colon.  Cholelithiasis as well as evidence of intrahepatic and extrahepatic biliary ductal dilatation. There are at multiple calculi within the distal common bile duct.  Nonobstructing left renal stone.  Diverticulosis without diverticulitis.   Electronically Signed   By: Inez Catalina M.D.   On: 07/08/2015  17:43     Assessment & Plan:    1. Gross hematuria Not sure this is a true gross hematuria event, but good news is he does not appear to be in retention. He leaked some clear urine from penis. Discussed with daughter repeat eval with CT and cysto and she declined which is reasonable.  - Urinalysis, Complete - Bladder Scan (Post Void Residual) in office  2. bph - continue finasteride and tamsulosin.   No follow-ups on file.  Festus Aloe, MD  [facility] [address], [place], [place] [address] [phone]

## 2020-03-03 ENCOUNTER — Encounter: Payer: Self-pay | Admitting: Physician Assistant

## 2020-03-03 ENCOUNTER — Other Ambulatory Visit: Payer: Self-pay

## 2020-03-03 ENCOUNTER — Ambulatory Visit (INDEPENDENT_AMBULATORY_CARE_PROVIDER_SITE_OTHER): Payer: Medicare HMO | Admitting: Physician Assistant

## 2020-03-03 VITALS — BP 120/74 | HR 73 | Ht 72.0 in | Wt 165.0 lb

## 2020-03-03 DIAGNOSIS — R310 Gross hematuria: Secondary | ICD-10-CM | POA: Diagnosis not present

## 2020-03-03 DIAGNOSIS — R350 Frequency of micturition: Secondary | ICD-10-CM | POA: Diagnosis not present

## 2020-03-03 LAB — URINALYSIS, COMPLETE
Bilirubin, UA: NEGATIVE
Glucose, UA: NEGATIVE
Ketones, UA: NEGATIVE
Nitrite, UA: NEGATIVE
Specific Gravity, UA: 1.02 (ref 1.005–1.030)
Urobilinogen, Ur: 0.2 mg/dL (ref 0.2–1.0)
pH, UA: 6 (ref 5.0–7.5)

## 2020-03-03 LAB — MICROSCOPIC EXAMINATION
Bacteria, UA: NONE SEEN
RBC, Urine: >30 /hpf — AB (ref 0–2)

## 2020-03-03 LAB — BLADDER SCAN AMB NON-IMAGING: Scan Result: 35

## 2020-03-03 NOTE — Progress Notes (Signed)
03/03/2020 1:58 PM   Chris Simon Chris Simon February 29, 1934 [id number]  CC: Chief Complaint  Patient presents with  . Hematuria    HPI: Chris Simon is a 86 y.o. comorbid male including BPH on Flomax and finasteride who presents today for reevaluation of gross hematuria.  Recent medical history notable for multiple hospitalizations with COVID-19 pneumonia and sequelae and development of Foley dependent urinary retention. Patient was initially placed on hospice but has recovered substantially such that he no longer qualifies for this; he passed a voiding trial in clinic one month ago. He was seen in clinic by Dr. Junious Simon on 02/20/2020 for evaluation of gross hematuria.  He was offered hematuria work-up at that time, but elected to defer.  He is accompanied today by his daughter, who contributes to HPI.  Today, patient reports worsened gross hematuria since they were last seen in clinic.  Daughter reports that she did see a small clot passage in his urine.  He denies pain associated with this.  In-office UA today positive for 3+ blood, 1+ protein, and trace leukocyte esterase; urine microscopy with >30 RBCs/HPF. PVR 70m.  PMH: Past Medical History:  Diagnosis Date  . Aneurysm ([place]    Abdominal Aortic Aneurysm  . Atrial fibrillation ([place]   . Blockage of a bile duct   . BPH with obstruction/lower urinary tract symptoms   . Cellulitis of scrotum   . COPD (chronic obstructive pulmonary disease) ([place]   . Dementia ([place]   . Diverticulosis   . Dyspnea    with exertion  . Dysrhythmia   . Elevated PSA   . Epididymitis   . Gallstone   . Gross hematuria   . History of kidney stones   . Hyperlipidemia   . Kidney stones   . Nocturia   . Testicular pain   . TIA (transient ischemic attack)    June 2018  . Urinary retention     Surgical History: Past Surgical History:  Procedure Laterality Date  . CHOLECYSTECTOMY N/A 07/11/2015   Procedure: LAPAROSCOPIC CHOLECYSTECTOMY;  Surgeon:  MRolm Bookbinder MD;  [place]  Service: General;  Laterality: N/A;  . ERCP N/A 07/09/2015   Procedure: ENDOSCOPIC RETROGRADE CHOLANGIOPANCREATOGRAPHY (ERCP);  Surgeon: MClarene Essex MD;  [facility];  Service: Endoscopy;  Laterality: N/A;  . INGUINAL HERNIA REPAIR Left 04/07/2017   Procedure: HERNIA REPAIR INGUINAL ADULT;  Surgeon: WClayburn Pert MD;  Location: [facility];  Service: General;  Laterality: Left;  . INSERTION OF MESH Left 04/07/2017   Procedure: INSERTION OF MESH;  Surgeon: WClayburn Pert MD;  Location: [facility];  Service: General;  Laterality: Left;  . KYPHOPLASTY N/A 11/16/2018   Procedure: KYPHOPLASTY T12;  Surgeon: MHessie Knows MD;  Location: [facility];  Service: Orthopedics;  Laterality: N/A;  . LEFT HEART CATH AND CORONARY ANGIOGRAPHY N/A 01/10/2018   Procedure: LEFT HEART CATH AND CORONARY ANGIOGRAPHY;  Surgeon: KDionisio David MD;  [facility];  Service: Cardiovascular;  Laterality: N/A;    Home Medications:  Allergies as of 03/03/2020      Reactions   Clindamycin Hcl Rash   Unknown reaction  Reaction: Unknown  Unknown reaction    Sulfamethoxazole-trimethoprim Rash      Medication List       Accurate as of March 03, 2020  1:58 PM. If you have any questions, ask your nurse or doctor.        albuterol 108 (90 Base) MCG/ACT inhaler Commonly known as: VENTOLIN HFA Inhale  1-2 puffs into the lungs every 4 (four) hours as needed for wheezing or shortness of breath.   amiodarone 200 MG tablet Commonly known as: PACERONE Take 200 mg by mouth at bedtime.   Aspercreme Lidocaine 4 % Ptch Generic drug: Lidocaine UNWRAP AND APPLY 1 PATCH TO SKIN ONCE DAILY FOR 12 HOURS ON AND 12 HOURS OFF   atropine 1 % ophthalmic solution SMARTSIG:1-2 Drop(s) In Eye(s) Every 4 Hours PRN   B-12 2500 MCG Tabs Take 2,500 mg by mouth daily.   bisacodyl 10 MG suppository Commonly known as: DULCOLAX Place 1 suppository (10 mg total) rectally  daily as needed for moderate constipation.   carvedilol 3.125 MG tablet Commonly known as: COREG Take 1 tablet (3.125 mg total) by mouth 2 (two) times daily.   cefdinir 300 MG capsule Commonly known as: OMNICEF   donepezil 10 MG tablet Commonly known as: ARICEPT Take 10 mg by mouth at bedtime.   Ear Wax Removal Drops 6.5 % OTIC solution Generic drug: carbamide peroxide INSTILL 5 DROPS IN BOTH EARS TWICE DAILY FOR 10 DAYS   Eliquis 5 MG Tabs tablet Generic drug: apixaban Take 5 mg by mouth 2 (two) times daily.   enalapril 2.5 MG tablet Commonly known as: VASOTEC Take 2.5 mg by mouth at bedtime.   feeding supplement (ENSURE ENLIVE) Liqd Take 237 mLs by mouth 2 (two) times daily between meals.   finasteride 5 MG tablet Commonly known as: PROSCAR Take 1 tablet (5 mg total) by mouth daily.   furosemide 20 MG tablet Commonly known as: LASIX Take 20 mg by mouth daily as needed for edema. >3lbs in 24 hours   haloperidol 0.5 MG tablet Commonly known as: HALDOL Take 0.5 mg by mouth 2 (two) times daily.   isosorbide mononitrate 30 MG 24 hr tablet Commonly known as: IMDUR Take 30 mg by mouth at bedtime.   loratadine 10 MG tablet Commonly known as: CLARITIN Take 10 mg by mouth daily as needed for allergies.   LORazepam 0.5 MG tablet Commonly known as: ATIVAN   memantine 10 MG tablet Commonly known as: NAMENDA Take 10 mg by mouth 2 (two) times daily.   mirabegron ER 25 MG Tb24 tablet Commonly known as: MYRBETRIQ Take 1 tablet (25 mg total) by mouth daily.   mirtazapine 15 MG tablet Commonly known as: REMERON Take 15 mg by mouth at bedtime.   morphine CONCENTRATE 10 mg / 0.5 ml concentrated solution GIVE 1 4 ML BY MOUTH OR UNDER THE TONGUE EVERY HOUR AS NEEDED FOR PAIN RESTLESSNESS OR AIR HUNGER   multivitamin with minerals Tabs tablet Take 1 tablet by mouth daily.   polyethylene glycol 17 g packet Commonly known as: MIRALAX / GLYCOLAX Take 17 g by mouth daily  as needed for mild constipation.   rosuvastatin 40 MG tablet Commonly known as: CRESTOR Take 40 mg by mouth at bedtime.   Stool Softener 100 MG capsule Generic drug: docusate sodium Take 100 mg by mouth 2 (two) times daily.   tamsulosin 0.4 MG Caps capsule Commonly known as: FLOMAX Take 1 capsule (0.4 mg total) by mouth daily.   traZODone 50 MG tablet Commonly known as: DESYREL Take 50 mg by mouth at bedtime.       Allergies:  Allergies  Allergen Reactions  . Clindamycin Hcl Rash    Unknown reaction  Reaction: Unknown  Unknown reaction   . Sulfamethoxazole-Trimethoprim Rash    Family History: Family History  Problem Relation Age of Onset  .  Prostate cancer Brother   . Kidney disease Neg Hx   . Bladder Cancer Neg Hx     Social History:   reports that he has quit smoking. His smoking use included cigarettes. He smoked 0.50 packs per day. He has never used smokeless tobacco. He reports that he does not drink alcohol or use drugs.  Physical Exam: BP 120/74   Pulse 73   Ht 6' (1.829 m)   Wt 165 lb (74.8 kg)   BMI 22.38 kg/m   Constitutional:  Sleeping but arousable, no acute distress, nontoxic appearing HEENT: [place], AT Cardiovascular: No clubbing, cyanosis, or edema Respiratory: Normal respiratory effort, no increased work of breathing Skin: No rashes, bruises or suspicious lesions Neurologic: Grossly intact, no focal deficits, moving all 4 extremities Psychiatric: Normal mood and affect  Laboratory Data: Results for orders placed or performed in visit on 03/03/20  Microscopic Examination   URINE  Result Value Ref Range   WBC, UA 0-5 0 - 5 /hpf   RBC >30 (A) 0 - 2 /hpf   Epithelial Cells (non renal) 0-10 0 - 10 /hpf   Bacteria, UA None seen None seen/Few  Urinalysis, Complete  Result Value Ref Range   Specific Gravity, UA 1.020 1.005 - 1.030   pH, UA 6.0 5.0 - 7.5   Color, UA Orange Yellow   Appearance Ur Cloudy (A) Clear   Leukocytes,UA Trace (A)  Negative   Protein,UA 1+ (A) Negative/Trace   Glucose, UA Negative Negative   Ketones, UA Negative Negative   RBC, UA 3+ (A) Negative   Bilirubin, UA Negative Negative   Urobilinogen, Ur 0.2 0.2 - 1.0 mg/dL   Nitrite, UA Negative Negative   Microscopic Examination See below:   Bladder Scan (Post Void Residual) in office  Result Value Ref Range   Scan Result 35    Assessment & Plan:   86 year old male with PMH BPH on Flomax and finasteride presents for reevaluation of gross hematuria in the setting of recent recovery from COVID-19 pneumonia and sequelae including urinary retention, no longer Foley dependent. 1. Gross hematuria I had a lengthy conversation today with the patient regarding the etiology of blood in the urine.  I explained that blood in the urine can be caused by a myriad of factors, including but not limited to infection, stones, cysts, anticoagulation, and urinary tract malignancies.  I explained that the recommended work-up for blood in the urine is twofold and includes a CT urogram for evaluation of the upper urinary tract including kidneys and ureters as well as a cystoscopy for evaluation of the urethra and bladder.  I explained that these two studies complement one another in reviewing the entire urinary tract for possible causes of bleeding.  I recommended that we proceed with this at this time.  Patient agreed. - Urinalysis, Complete - CT HEMATURIA WORKUP  2. Urinary frequency PVR WNL today and UA reassuring for infection. - Bladder Scan (Post Void Residual) in office  Return in about 4 weeks (around 03/31/2020) for Cysto and CTU results.  Debroah Loop, PA-C  [facility] [address] [place], [place] [address] [phone]

## 2020-03-12 ENCOUNTER — Inpatient Hospital Stay: Admission: RE | Admit: 2020-03-12 | Payer: Medicare HMO | Source: Ambulatory Visit

## 2020-03-14 ENCOUNTER — Other Ambulatory Visit: Payer: Self-pay

## 2020-03-14 ENCOUNTER — Ambulatory Visit
Admission: RE | Admit: 2020-03-14 | Discharge: 2020-03-14 | Disposition: A | Discharge status: Home / Self Care | Payer: Medicare HMO | Source: Ambulatory Visit | Attending: Physician Assistant | Admitting: Physician Assistant

## 2020-03-14 DIAGNOSIS — R310 Gross hematuria: Secondary | ICD-10-CM | POA: Insufficient documentation

## 2020-03-14 LAB — POCT I-STAT CREATININE: Creatinine, Ser: 1.3 mg/dL — ABNORMAL HIGH (ref 0.61–1.24)

## 2020-03-14 MED ORDER — IOHEXOL 300 MG/ML  SOLN
100.0000 mL | Freq: Once | INTRAMUSCULAR | Status: AC | PRN
  Administered 2020-03-14: 100 mL via INTRAVENOUS

## 2020-03-29 NOTE — Progress Notes (Signed)
04/01/2020 5:01 PM   Chris Simon [birth year] [id number]  Referring provider: [name], MD [place],  [place] [id number] Chief Complaint  Patient presents with  . Cysto    HPI: Chris Simon is a 86 y.o. comorbid male including BPH on Flomax and finasteride who presents today for CTU results.  UA today concerning for infection thus cystoscopy today was deferred.  Patient was seen by Debroah Loop, PA-C on 03/03/2020 for reevaluation of gross hematuria. UA was positive for 3+ blood, 1+ protein, and trace leukocyte esterase; urine microscopy with >30 RBCs/HPF. PVR 18mL.  CT hematuria workup on 03/14/2020 revealed small nonobstructive calculus of the midportion of the left kidney. There were two small adjacent calculi in the dependent left urinary bladder adjacent to the ureteropelvic junction measuring 2-3 mm. No hydronephrosis. There was no evidence of mass or urinary tract filling defect. Severe prostatomegaly with thickening of the urinary bladder, likely due to chronic outlet obstruction.   PVR was 13 mL today.   He denies any worsening symptoms.  Currently does not have a Foley catheter.  No dysuria, fevers or chills.   PMH: Past Medical History:  Diagnosis Date  . Aneurysm ([place])    Abdominal Aortic Aneurysm  . Atrial fibrillation ([place])   . Blockage of a bile duct   . BPH with obstruction/lower urinary tract symptoms   . Cellulitis of scrotum   . COPD (chronic obstructive pulmonary disease) ([place])   . Dementia ([place])   . Diverticulosis   . Dyspnea    with exertion  . Dysrhythmia   . Elevated PSA   . Epididymitis   . Gallstone   . Gross hematuria   . History of kidney stones   . Hyperlipidemia   . Kidney stones   . Nocturia   . Testicular pain   . TIA (transient ischemic attack)    June 2018  . Urinary retention     Surgical History: Past Surgical History:  Procedure Laterality Date  . CHOLECYSTECTOMY N/A 07/11/2015    Procedure: LAPAROSCOPIC CHOLECYSTECTOMY;  Surgeon: Rolm Bookbinder, MD;  Location: [place];  Service: General;  Laterality: N/A;  . ERCP N/A 07/09/2015   Procedure: ENDOSCOPIC RETROGRADE CHOLANGIOPANCREATOGRAPHY (ERCP);  Surgeon: Clarene Essex, MD;  [facility];  Service: Endoscopy;  Laterality: N/A;  . INGUINAL HERNIA REPAIR Left 04/07/2017   Procedure: HERNIA REPAIR INGUINAL ADULT;  Surgeon: Clayburn Pert, MD;  Location: [facility];  Service: General;  Laterality: Left;  . INSERTION OF MESH Left 04/07/2017   Procedure: INSERTION OF MESH;  Surgeon: Clayburn Pert, MD;  Location: [facility];  Service: General;  Laterality: Left;  . KYPHOPLASTY N/A 11/16/2018   Procedure: KYPHOPLASTY T12;  Surgeon: Hessie Knows, MD;  Location: [facility];  Service: Orthopedics;  Laterality: N/A;  . LEFT HEART CATH AND CORONARY ANGIOGRAPHY N/A 01/10/2018   Procedure: LEFT HEART CATH AND CORONARY ANGIOGRAPHY;  Surgeon: Dionisio David, MD;  Location: [place] [facility] LAB;  Service: Cardiovascular;  Laterality: N/A;    Home Medications:  Allergies as of 04/01/2020      Reactions   Clindamycin Hcl Rash   Unknown reaction  Reaction: Unknown  Unknown reaction    Sulfamethoxazole-trimethoprim Rash      Medication List       Accurate as of April 01, 2020  5:01 PM. If you have any questions, ask your nurse or doctor.        STOP taking these medications   Aspercreme  Lidocaine 4 % Ptch Generic drug: Lidocaine Stopped by: Hollice Espy, MD   bisacodyl 10 MG suppository Commonly known as: DULCOLAX Stopped by: Hollice Espy, MD   cefdinir 300 MG capsule Commonly known as: OMNICEF Stopped by: Hollice Espy, MD   furosemide 20 MG tablet Commonly known as: LASIX Stopped by: Hollice Espy, MD   haloperidol 0.5 MG tablet Commonly known as: HALDOL Stopped by: Hollice Espy, MD   LORazepam 0.5 MG tablet Commonly known as: ATIVAN Stopped by: Hollice Espy, MD   mirabegron ER 25 MG Tb24  tablet Commonly known as: MYRBETRIQ Stopped by: Hollice Espy, MD   morphine CONCENTRATE 10 mg / 0.5 ml concentrated solution Stopped by: Hollice Espy, MD   polyethylene glycol 17 g packet Commonly known as: MIRALAX / GLYCOLAX Stopped by: Hollice Espy, MD   Stool Softener 100 MG capsule Generic drug: docusate sodium Stopped by: Hollice Espy, MD   traZODone 50 MG tablet Commonly known as: DESYREL Stopped by: Hollice Espy, MD     TAKE these medications   albuterol 108 (90 Base) MCG/ACT inhaler Commonly known as: VENTOLIN HFA Inhale 1-2 puffs into the lungs every 4 (four) hours as needed for wheezing or shortness of breath.   amiodarone 200 MG tablet Commonly known as: PACERONE Take 200 mg by mouth at bedtime.   atropine 1 % ophthalmic solution SMARTSIG:1-2 Drop(s) In Eye(s) Every 4 Hours PRN   B-12 2500 MCG Tabs Take 2,500 mg by mouth daily.   carvedilol 3.125 MG tablet Commonly known as: COREG Take 1 tablet (3.125 mg total) by mouth 2 (two) times daily.   donepezil 10 MG tablet Commonly known as: ARICEPT Take 10 mg by mouth at bedtime.   Ear Wax Removal Drops 6.5 % OTIC solution Generic drug: carbamide peroxide INSTILL 5 DROPS IN BOTH EARS TWICE DAILY FOR 10 DAYS   Eliquis 5 MG Tabs tablet Generic drug: apixaban Take 5 mg by mouth 2 (two) times daily.   enalapril 2.5 MG tablet Commonly known as: VASOTEC Take 2.5 mg by mouth at bedtime.   feeding supplement (ENSURE ENLIVE) Liqd Take 237 mLs by mouth 2 (two) times daily between meals.   finasteride 5 MG tablet Commonly known as: PROSCAR Take 1 tablet (5 mg total) by mouth daily.   isosorbide mononitrate 30 MG 24 hr tablet Commonly known as: IMDUR Take 30 mg by mouth at bedtime.   loratadine 10 MG tablet Commonly known as: CLARITIN Take 10 mg by mouth daily as needed for allergies.   memantine 10 MG tablet Commonly known as: NAMENDA Take 10 mg by mouth 2 (two) times daily.   mirtazapine  15 MG tablet Commonly known as: REMERON Take 15 mg by mouth at bedtime.   multivitamin with minerals Tabs tablet Take 1 tablet by mouth daily.   rosuvastatin 40 MG tablet Commonly known as: CRESTOR Take 40 mg by mouth at bedtime.   tamsulosin 0.4 MG Caps capsule Commonly known as: FLOMAX Take 1 capsule (0.4 mg total) by mouth daily.       Allergies:  Allergies  Allergen Reactions  . Clindamycin Hcl Rash    Unknown reaction  Reaction: Unknown  Unknown reaction   . Sulfamethoxazole-Trimethoprim Rash    Family History: Family History  Problem Relation Age of Onset  . Prostate cancer Brother   . Kidney disease Neg Hx   . Bladder Cancer Neg Hx     Social History:  reports that he has quit smoking. His smoking use included cigarettes. He  smoked 0.50 packs per day. He has never used smokeless tobacco. He reports that he does not drink alcohol and does not use drugs.   Physical Exam: BP 107/68   Pulse (!) 59   Constitutional:  Alert and oriented, No acute distress.  In wheelchair, accompanied by daughter today. HEENT:  AT, moist mucus membranes.  Trachea midline, no masses. Cardiovascular: No clubbing, cyanosis, or edema. Respiratory: Normal respiratory effort, no increased work of breathing. Skin: No rashes, bruises or suspicious lesions. Neurologic: Grossly intact, no focal deficits, moving all 4 extremities. Psychiatric: Normal mood and affect.  Laboratory Data:  Lab Results  Component Value Date   CREATININE 1.30 (H) 03/14/2020     Urinalysis Nitrite positve, WBC >30, many bacteria  Pertinent Imaging: Results for orders placed or performed in visit on 04/01/20  BLADDER SCAN AMB NON-IMAGING  Result Value Ref Range   Scan Result 74ml     Results for orders placed in visit on 03/03/20  CT HEMATURIA WORKUP  Narrative CLINICAL DATA:  Gross hematuria  EXAM: CT ABDOMEN AND PELVIS WITHOUT AND WITH CONTRAST  TECHNIQUE: Multidetector CT imaging of  the abdomen and pelvis was performed following the standard protocol before and following the bolus administration of intravenous contrast.  CONTRAST:  16mL OMNIPAQUE IOHEXOL 300 MG/ML  SOLN  COMPARISON:  07/08/2015  FINDINGS: Lower chest: No acute abnormality.  Coronary artery calcifications.  Hepatobiliary: No focal liver abnormality is seen. Status post cholecystectomy. Postoperative biliary ductal dilatation and pneumobilia.  Pancreas: Unremarkable. No pancreatic ductal dilatation or surrounding inflammatory changes.  Spleen: Normal in size without significant abnormality.  Adrenals/Urinary Tract: Adrenal glands are unremarkable. Small nonobstructive calculus of the midportion of the left kidney. Two small adjacent calculi in the dependent left urinary bladder adjacent to the ureteropelvic junction measuring 2-3 mm (series 3, image 72). No hydronephrosis. Thickening of the urinary bladder, likely due to chronic outlet obstruction.  Stomach/Bowel: Stomach is within normal limits. Appendix appears normal. No evidence of bowel wall thickening, distention, or inflammatory changes. Pancolonic diverticulosis.  Vascular/Lymphatic: Aortic atherosclerosis. Aneurysm of the infrarenal abdominal aorta measuring up to 3.7 x 3.3 cm, slightly increased in size compared to prior examination dated 2016 at which time it measured 3.4 x 3.0 cm. No enlarged abdominal or pelvic lymph nodes.  Reproductive: Severe prostatomegaly.  Other: Status post bilateral inguinal hernia repair. No abdominopelvic ascites.  Musculoskeletal: No acute or significant osseous findings.  IMPRESSION: 1. Small nonobstructive calculus of the midportion of the left kidney. Two small adjacent calculi in the dependent left urinary bladder adjacent to the ureteropelvic junction measuring 2-3 mm. No hydronephrosis. 2. No evidence of mass or urinary tract filling defect. 3. Severe prostatomegaly with thickening  of the urinary bladder, likely due to chronic outlet obstruction. 4. Aneurysm of the infrarenal abdominal aorta measuring up to 3.7 x 3.3 cm, slightly increased in size compared to prior examination dated 2016 at which time it measured 3.4 x 3.0 cm. Aortic Atherosclerosis (ICD10-I70.0). Aortic aneurysm NOS (ICD10-I71.9).   Electronically Signed By: Eddie Candle M.D. On: 03/17/2020 08:25   Assessment & Plan:    1. Gross hematuria Suspect likely secondary to large friable prostate  CT hematuria workup on 03/14/2020 revealed small nonobstructive calculus of the midportion of the left kidney. There were two small adjacent calculi in the dependent left urinary bladder adjacent to the ureteropelvic junction measuring 2-3 mm.  See #2, reschedule cystoscopy  2. Acute cystitis   Treatment with antibiotics x 1 week based on  culture and sensitivity data once is returning UA was nitrite positve, WBC >30 thus discussed concern for possible infectious complications including bacteremia and poor visualization Urine culture is sent. Currently relatively asymptomatic this will hold off on antibiotics until we can administer culture specific antibiotics  3. Urinary frequency/BPH with bladder outlet obstruction Emptying adequately today Continue Flomax and finasteride CT scan with sequela of chronic outlet obstruction including bladder wall thickening, punctate bladder stones and severe prostamegaly Poor surgical candidate  Follow up in 2 weeks for cysto.  [facility] [address] [place], [place] [address] [phone]   I, Selena Batten, am acting as a scribe for Dr. Hollice Espy.  I have reviewed the above documentation for accuracy and completeness, and I agree with the above.   Hollice Espy, MD

## 2020-04-01 ENCOUNTER — Other Ambulatory Visit: Payer: Self-pay

## 2020-04-01 ENCOUNTER — Encounter: Payer: Self-pay | Admitting: Urology

## 2020-04-01 ENCOUNTER — Ambulatory Visit: Payer: Medicare HMO | Admitting: Urology

## 2020-04-01 VITALS — BP 107/68 | HR 59

## 2020-04-01 DIAGNOSIS — R310 Gross hematuria: Secondary | ICD-10-CM | POA: Diagnosis not present

## 2020-04-01 LAB — BLADDER SCAN AMB NON-IMAGING

## 2020-04-02 LAB — URINALYSIS, COMPLETE
Bilirubin, UA: NEGATIVE
Glucose, UA: NEGATIVE
Ketones, UA: NEGATIVE
Nitrite, UA: POSITIVE — AB
Protein,UA: NEGATIVE
Specific Gravity, UA: 1.02 (ref 1.005–1.030)
Urobilinogen, Ur: 0.2 mg/dL (ref 0.2–1.0)
pH, UA: 5 (ref 5.0–7.5)

## 2020-04-02 LAB — MICROSCOPIC EXAMINATION: WBC, UA: >30 /hpf — AB (ref 0–5)

## 2020-04-03 LAB — CULTURE, URINE COMPREHENSIVE

## 2020-04-04 ENCOUNTER — Telehealth: Payer: Self-pay | Admitting: *Deleted

## 2020-04-04 MED ORDER — NITROFURANTOIN MONOHYD MACRO 100 MG PO CAPS
100.0000 mg | ORAL_CAPSULE | Freq: Two times a day (BID) | ORAL | 0 refills | Status: DC
Start: 2020-04-04 — End: 2020-04-16

## 2020-04-04 NOTE — Telephone Encounter (Addendum)
Patient's daughter informed, voiced understanding.   ----- Message from Hollice Espy, MD sent at 04/03/2020  5:39 PM EDT ----- Please treat with macrobid 100 mg bid x 10 days  Hollice Espy, MD

## 2020-04-15 NOTE — Progress Notes (Signed)
   04/16/2020  CC:  Chief Complaint  Patient presents with  . Cysto    PRF:FMBW Chris Simon is a 86 y.o. comorbid maleincluding BPH on Flomax and finasteride who presents today for a cysto. He is accompanied by is daughter today.   CT hematuria workup on 03/14/2020 revealed small nonobstructive calculus of the midportion of the left kidney. There were two small adjacent calculi in the dependent left urinary bladder adjacent to the ureteropelvic junction measuring 2-3 mm. No hydronephrosis. There was no evidence of mass or urinary tract filling defect. Severe prostatomegaly with thickening of the urinary bladder, likely due to chronic outlet obstruction.   His daughter notes he has a palpable knot and bruise down his leg. His daughter denies the patient having any trauma. She notes that he has shower doors for support. He is on Eliquis.  Blood pressure 97/60, pulse 62. NED. A&Ox3.   No respiratory distress   Abd soft, NT, ND Normal phallus with bilateral descended testicles  Cystoscopy Procedure Note  Patient identification was confirmed, informed consent was obtained, and patient was prepped using Betadine solution.  Lidocaine jelly was administered per urethral meatus.     Pre-Procedure: - Inspection reveals a normal caliber ureteral meatus.  Procedure: The flexible cystoscope was introduced without difficulty - Bulbar urethral strictures are present x 2. Super stiff wire able to pass through first stricture. There second stricture was more dense in appearance, unable to advance scope beyond this level   Post-Procedure: - Patient tolerated the procedure well  Assessment/ Plan:  1. Bulbar strictures Ureteral stricture disease- post infective/ procedural Unable to scope today-  Discussed risk and benefits of urethral dilation in the operating room, due to his age risk out way benefits.  Unable to r/o any underlying pathology contributing to gross hematuria such as bladder  cancer without cysto, however risks of dilation may outweigh benifits Patient, his daughter, patient  and I all agree on no surgical intervention at this time given age and comorbities, he's voiding well at this time Goals of care discussion today, opt for less invasive, mor conservative  2. History of urinary rentention Emptying well currently Will follow closely to ensure emptying  3. Gross hematuria Defer from office cysto due to #1   Follow up in 6 weeks with Debroah Loop, PA-C for PVR   I, Selena Batten, am acting as a scribe for Dr. Hollice Espy.  I have reviewed the above documentation for accuracy and completeness, and I agree with the above.   Hollice Espy, MD

## 2020-04-16 ENCOUNTER — Ambulatory Visit: Payer: Medicare HMO | Admitting: Urology

## 2020-04-16 ENCOUNTER — Other Ambulatory Visit: Payer: Self-pay

## 2020-04-16 VITALS — BP 97/60 | HR 62

## 2020-04-16 DIAGNOSIS — N35112 Postinfective bulbous urethral stricture, not elsewhere classified: Secondary | ICD-10-CM

## 2020-04-16 DIAGNOSIS — R310 Gross hematuria: Secondary | ICD-10-CM | POA: Diagnosis not present

## 2020-04-17 LAB — MICROSCOPIC EXAMINATION: Bacteria, UA: NONE SEEN

## 2020-04-17 LAB — URINALYSIS, COMPLETE
Bilirubin, UA: NEGATIVE
Glucose, UA: NEGATIVE
Leukocytes,UA: NEGATIVE
Nitrite, UA: NEGATIVE
Protein,UA: NEGATIVE
Specific Gravity, UA: 1.025 (ref 1.005–1.030)
Urobilinogen, Ur: 0.2 mg/dL (ref 0.2–1.0)
pH, UA: 5 (ref 5.0–7.5)

## 2020-05-26 ENCOUNTER — Ambulatory Visit: Payer: Self-pay | Admitting: Physician Assistant

## 2020-05-26 ENCOUNTER — Encounter: Payer: Self-pay | Admitting: Physician Assistant

## 2020-06-04 ENCOUNTER — Ambulatory Visit: Payer: Medicare HMO | Admitting: Urology

## 2020-07-18 ENCOUNTER — Telehealth: Payer: Self-pay | Admitting: Primary Care

## 2020-07-18 NOTE — Telephone Encounter (Signed)
Spoke with patient's daughter, [name], regarding the Palliative referral/services and all questions were answered and she was in agreement with scheduling visit.  I have scheduled an In-person Consult for 07/22/20 @ 8:30 AM.

## 2020-07-22 ENCOUNTER — Other Ambulatory Visit: Payer: Medicare HMO | Admitting: Primary Care

## 2020-07-22 ENCOUNTER — Other Ambulatory Visit: Payer: Self-pay

## 2020-07-22 DIAGNOSIS — I4811 Longstanding persistent atrial fibrillation: Secondary | ICD-10-CM

## 2020-07-22 DIAGNOSIS — J449 Chronic obstructive pulmonary disease, unspecified: Secondary | ICD-10-CM

## 2020-07-22 DIAGNOSIS — G309 Alzheimer's disease, unspecified: Secondary | ICD-10-CM

## 2020-07-22 DIAGNOSIS — F0280 Dementia in other diseases classified elsewhere without behavioral disturbance: Secondary | ICD-10-CM

## 2020-07-22 DIAGNOSIS — Z515 Encounter for palliative care: Secondary | ICD-10-CM

## 2020-07-22 NOTE — Progress Notes (Signed)
[facility] Consult Note Telephone: [phone]  Fax: [phone]  PATIENT NAME: Chris Simon [address] [place] [address] [phone] (home)  DOB: Oct 19, 1933 MRN: [id number]  PRIMARY CARE PROVIDER:    [name], MD,  [place] [address] [phone]  REFERRING PROVIDER:   Sofie Simon, [place],  [place] [address] [phone]  RESPONSIBLE PARTY:   Extended Emergency Contact Information Primary Emergency Contact: Chris Simon,Chris Simon Address: [place], [place] [address] Chris Simon Phone: [phone] Relation: Daughter Secondary Emergency Contact: Chris Simon Mobile Phone: [phone] Relation: Daughter  I met face to face with patient and family in  Home.   ASSESSMENT AND RECOMMENDATIONS:   1. Advance Care Planning/Goals of Care: Goals include to maximize quality of life and symptom management. Our advance care planning conversation included a discussion about:     The value and importance of advance care planning   Experiences with loved ones who have been seriously ill or have died   Exploration of personal, cultural or spiritual beliefs that might influence medical decisions   Exploration of goals of care in the event of a sudden injury or illness   Identification of a healthcare agent - 5 wishes given, needs to do POA paperwork.  Review and updating or creation of an  advance directive document .  MOST with DNR, limited scope, use of abx, limited iv and Simon feeding tube, DNR done  2. Symptom Management:   Recent UTI evidenced by odor, hypodelirium, fall risk. Resolving. On abx now, recommended probiotic due to long course of abx.  Insomnia: Failed trazodone, seroquel, ativan but xanax was not tried, has been taking 2 mg now. Had a good response.  Restless leg: Endorses, has appt to address with MD this week. May  benefit from gabapentin or similar.  Caregiver strain:  Committed to staying at home and not facility. Daughter endorses strain. Discussed [employer], local day programs, hiring care givers or night sitters, or asking for help from church community. Daughter will consider these outlets for additional assistance   3. Follow up Palliative Care Visit: Palliative care will continue to follow for goals of care clarification and symptom management. Return 12 weeks or prn.  4. Family /Caregiver/Community Supports: Lives with daughter in own home, church community, Simon formal support. Chris Simon. Number of day program given.  5. Cognitive / Functional decline: A and O x 2, Able to toilet, dress. Mod cognitive and judgement impairment.  I spent 75 minutes providing this consultation,  from 0830 to 0945. More than 50% of the time in this consultation was spent coordinating communication.   CHIEF COMPLAINT: insomnia  HISTORY OF PRESENT ILLNESS:  Chris Simon is a 86 y.o. year old male  with dementia and insomnia. Melatonin 2 mg worked for a short time. Other medications produce paradoxical effect. Caregiver strain endorsed due to insomnia . We are asked to consult around goals of care and symptom management.   Palliative Care was asked to follow this patient by consultation request of Simon, Chris Noa, MD to help address advance care planning and goals of care. This is the initial visit.  CODE STATUS: DNR  PPS: 60%  HOSPICE ELIGIBILITY/DIAGNOSIS:Simon  ROS  General: NAD EYES: denies vision changes ENMT: denies dysphagia Cardiovascular: denies chest pain Pulmonary: denies  cough, denies increased SOB  Abdomen:  endorses fair appetite, denies constipation, endorses continence of bowels  GU: denies dysuria, endorses continence of urine, recent UIT MSK:  endorses ROM limitations, Simon Simon reported Skin: denies rashes or wounds Neurological:, endorses occ pain, endorses insomnia Psych: Endorses  positive mood  Physical Exam: Current and past weights:stable Constitutional:  NAD General :frail appearing, WNWD EYES: anicteric sclera,EOMI, lids intact, Simon discharge  ENMT: intact hearing,oral mucous membranes moist CV: 1+ LE edema Pulmonary: Simon increased work of breathing, Simon cough, Simon audible wheezes, room air Abdomen: intake 100%,  Simon ascites GU: deferred MSK: mild sacropenia, decreased ROM in all extremities, Simon contractures of LE, ambulatory (I) Skin: warm and dry, Simon rashes or wounds on visible skin Neuro: moderate  cognitive impairment, FAST score 5- 6a, grossly non -focal Psych: non -anxious affect, A and O x 2    CURRENT PROBLEM LIST:  Patient Active Problem List   Diagnosis Date Noted  . Dehydration   . Hypotension   . Pneumonia due to COVID-19 virus 11/01/2019  . COPD (chronic obstructive pulmonary disease) ([facility])   . Acute respiratory failure with hypoxia ([facility]) 10/22/2019  . Chronic indwelling Foley catheter   . Alzheimer's dementia without behavioral disturbance ([facility])   . Pneumonia 02/20/2017  . TIA (transient ischemic attack) 02/18/2017  . Left inguinal hernia 06/22/2016  . Umbilical hernia without obstruction and without gangrene 06/22/2016  . Calculus of kidney 09/03/2015  . Testicular cyst 09/03/2015  . Sepsis secondary to UTI ([facility]) 07/20/2015  . BPH (benign prostatic hyperplasia) 07/18/2015  . Elevated PSA 07/18/2015  . Sepsis ([facility]) 07/09/2015  . Acute kidney injury ([facility])   . Paroxysmal A-fib ([facility])   . Lower urinary tract infectious disease   . Atrial fibrillation ([facility]) 10/01/2014  . Pure hypercholesterolemia 10/01/2014  . Benign fibroma of prostate 10/06/2012  . Bladder retention 10/06/2012   PAST MEDICAL HISTORY:  Past Medical History:  Diagnosis Date  . Aneurysm ([facility])    Abdominal Aortic Aneurysm  . Atrial fibrillation ([facility])   . Blockage of a bile duct   . BPH with obstruction/lower urinary tract symptoms   . Cellulitis of scrotum   .  COPD (chronic obstructive pulmonary disease) ([facility])   . Dementia ([facility])   . Diverticulosis   . Dyspnea    with exertion  . Dysrhythmia   . Elevated PSA   . Epididymitis   . Gallstone   . Gross hematuria   . History of kidney stones   . Hyperlipidemia   . Kidney stones   . Nocturia   . Testicular pain   . TIA (transient ischemic attack)    June 2018  . Urinary retention     SOCIAL HX:  Social History   Tobacco Use  . Smoking status: Former Smoker    Packs/day: 0.50    Types: Cigarettes  . Smokeless tobacco: Never Used  . Tobacco comment: quit 40 years  Substance Use Topics  . Alcohol use: Simon    Alcohol/week: 0.0 standard drinks   FAMILY HX:  Family History  Problem Relation Age of Onset  . Prostate cancer Brother   . Kidney disease Neg Hx   . Bladder Cancer Neg Hx     ALLERGIES:  Allergies  Allergen Reactions  . Clindamycin Hcl Rash    Unknown reaction  Reaction: Unknown  Unknown reaction   . Sulfamethoxazole-Trimethoprim Rash     PERTINENT MEDICATIONS:  Outpatient Encounter Medications as of 07/22/2020  Medication Sig  . acetaminophen (TYLENOL) 325 MG tablet Take  650 mg by mouth every 6 (six) hours as needed.  Marland Kitchen albuterol (VENTOLIN HFA) 108 (90 Base) MCG/ACT inhaler Inhale 1-2 puffs into the lungs every 4 (four) hours as needed for wheezing or shortness of breath.  Marland Kitchen amiodarone (PACERONE) 200 MG tablet Take 200 mg by mouth at bedtime.   . carvedilol (COREG) 3.125 MG tablet Take 1 tablet (3.125 mg total) by mouth 2 (two) times daily.  . Cyanocobalamin (B-12) 2500 MCG TABS Take 2,500 mg by mouth daily.  Marland Kitchen donepezil (ARICEPT) 10 MG tablet Take 10 mg by mouth at bedtime.  Marland Kitchen doxycycline (VIBRAMYCIN) 100 MG capsule Take 100 mg by mouth 2 (two) times daily.  Marland Kitchen ELIQUIS 5 MG TABS tablet Take 5 mg by mouth 2 (two) times daily.  . enalapril (VASOTEC) 2.5 MG tablet Take 2.5 mg by mouth at bedtime.   . finasteride (PROSCAR) 5 MG tablet Take 1 tablet (5 mg total) by  mouth daily.  . furosemide (LASIX) 20 MG tablet Take 20 mg by mouth daily as needed. For feet swelling.  . isosorbide mononitrate (IMDUR) 30 MG 24 hr tablet Take 30 mg by mouth at bedtime.   Marland Kitchen loratadine (CLARITIN) 10 MG tablet Take 10 mg by mouth daily as needed for allergies.  . Melatonin 10 MG TABS Take by mouth.  . memantine (NAMENDA) 10 MG tablet Take 10 mg by mouth 2 (two) times daily.   . mirtazapine (REMERON) 15 MG tablet Take 15 mg by mouth at bedtime.   . Multiple Vitamin (MULTIVITAMIN WITH MINERALS) TABS tablet Take 1 tablet by mouth daily.  . predniSONE (DELTASONE) 20 MG tablet Take 40 mg by mouth daily with breakfast.  . rosuvastatin (CRESTOR) 40 MG tablet Take 40 mg by mouth at bedtime.  . tamsulosin (FLOMAX) 0.4 MG CAPS capsule Take 1 capsule (0.4 mg total) by mouth daily.  . [DISCONTINUED] atropine 1 % ophthalmic solution SMARTSIG:1-2 Drop(s) In Eye(s) Every 4 Hours PRN  . [DISCONTINUED] EAR WAX REMOVAL DROPS 6.5 % OTIC solution INSTILL 5 DROPS IN BOTH EARS TWICE DAILY FOR 10 DAYS  . [DISCONTINUED] feeding supplement, ENSURE ENLIVE, (ENSURE ENLIVE) LIQD Take 237 mLs by mouth 2 (two) times daily between meals.  . [DISCONTINUED] QUEtiapine (SEROQUEL) 25 MG tablet Take 25-50 mg by mouth at bedtime.  . [DISCONTINUED] spironolactone (ALDACTONE) 25 MG tablet Take 25 mg by mouth daily.   Simon facility-administered encounter medications on file as of 07/22/2020.      Jason Coop, NP , DNP, MPH, AGPCNP-BC, ACHPN  COVID-19 PATIENT SCREENING TOOL  Person answering questions: ____________Carolyn______ _____   1.  Is the patient or any family member in the home showing any signs or symptoms regarding respiratory infection?               Person with Symptom- __________NA_________________  a. Fever                                                                          Yes___ No___          ___________________  b. Shortness of breath  Yes___ No___          ___________________ c. Cough/congestion                                       Yes___  No___         ___________________ d. Body aches/pains                                                         Yes___ No___        ____________________ e. Gastrointestinal symptoms (diarrhea, nausea)           Yes___ No___        ____________________  2. Within the past 14 days, has anyone living in the home had any contact with someone with or under investigation for COVID-19?    Yes___ No_X_   Person __________________   

## 2020-08-13 ENCOUNTER — Other Ambulatory Visit: Payer: Self-pay | Admitting: Urology

## 2020-08-13 DIAGNOSIS — N401 Enlarged prostate with lower urinary tract symptoms: Secondary | ICD-10-CM

## 2020-08-22 ENCOUNTER — Ambulatory Visit (INDEPENDENT_AMBULATORY_CARE_PROVIDER_SITE_OTHER): Payer: Medicare HMO

## 2020-08-22 ENCOUNTER — Encounter: Payer: Self-pay | Admitting: Emergency Medicine

## 2020-08-22 ENCOUNTER — Ambulatory Visit
Admission: EM | Admit: 2020-08-22 | Discharge: 2020-08-22 | Disposition: A | Discharge status: Home / Self Care | Payer: Medicare HMO | Attending: Emergency Medicine | Admitting: Emergency Medicine

## 2020-08-22 ENCOUNTER — Other Ambulatory Visit: Payer: Self-pay

## 2020-08-22 DIAGNOSIS — J449 Chronic obstructive pulmonary disease, unspecified: Secondary | ICD-10-CM | POA: Diagnosis not present

## 2020-08-22 DIAGNOSIS — Z20822 Contact with and (suspected) exposure to covid-19: Secondary | ICD-10-CM | POA: Insufficient documentation

## 2020-08-22 DIAGNOSIS — R059 Cough, unspecified: Secondary | ICD-10-CM | POA: Diagnosis not present

## 2020-08-22 DIAGNOSIS — J441 Chronic obstructive pulmonary disease with (acute) exacerbation: Secondary | ICD-10-CM | POA: Insufficient documentation

## 2020-08-22 LAB — RESP PANEL BY RT-PCR (FLU A&B, COVID) ARPGX2
Influenza A by PCR: NEGATIVE
Influenza B by PCR: NEGATIVE
SARS Coronavirus 2 by RT PCR: NEGATIVE

## 2020-08-22 MED ORDER — BENZONATATE 200 MG PO CAPS: 200.0000 mg | ORAL_CAPSULE | Freq: Three times a day (TID) | ORAL | 0 refills | Status: DC | PRN

## 2020-08-22 MED ORDER — AMOXICILLIN-POT CLAVULANATE 875-125 MG PO TABS: 1.0000 | ORAL_TABLET | Freq: Two times a day (BID) | ORAL | 0 refills | Status: AC

## 2020-08-22 MED ORDER — PREDNISONE 20 MG PO TABS: 40.0000 mg | ORAL_TABLET | Freq: Every day | ORAL | 0 refills | Status: AC

## 2020-08-22 NOTE — ED Triage Notes (Signed)
Pts daughter is here with him as historian. Pt has been experiencing cough and congestion x 2 weeks. Daughter is concerned about pneumonia due to pts hx of COPD. Pt states he just can't stop coughing. Daughter states he has had to use his inhaler more frequently and she has been using allergy medicine with no relief of symptoms.

## 2020-08-22 NOTE — ED Provider Notes (Signed)
HPI  SUBJECTIVE:  Chris Simon is a 86 y.o. male who presents with 2 weeks, nonproductive cough, nasal congestion, clear rhinorrhea, wheezing, shortness of breath.  No fevers, body aches, headaches, sinus pain or pressure, sore throat, symptoms or swelling face, chest pain, nausea, vomiting or diarrhea, abdominal pain.  No flu or Covid exposure.  Some sneezing, no itchy, watery eyes.  No antipyretic in the past 6 hours.  He did not get the Covid vaccine.  He has gotten the flu shot he was treated with doxycycline for UTI within the past 3 months.  Daughter has been giving the patient Zyrtec, and albuterol inhaler with a spacer every 4-5 hours and she has started him on 100 mg of doxycycline once a day for the past 3 days.  No aggravating or alleviating factors.  She states that his baseline oxygen saturations are 90 to 93% and has been in this range over the past 2 weeks. Patient has a past medical history of aortic abdominal aneurysm, atrial fibrillation, COPD, Covid, pneumonia, TIA, Alzheimer's UTI, urosepsis.  No history of chronic kidney disease.  No recent steroid use for COPD.  No admissions for COPD.  All history obtained through her daughter.  Past Medical History:  Diagnosis Date  . Aneurysm ([facility])    Abdominal Aortic Aneurysm  . Atrial fibrillation ([facility])   . Blockage of a bile duct   . BPH with obstruction/lower urinary tract symptoms   . Cellulitis of scrotum   . COPD (chronic obstructive pulmonary disease) ([facility])   . Dementia ([facility])   . Diverticulosis   . Dyspnea    with exertion  . Dysrhythmia   . Elevated PSA   . Epididymitis   . Gallstone   . Gross hematuria   . History of kidney stones   . Hyperlipidemia   . Kidney stones   . Nocturia   . Testicular pain   . TIA (transient ischemic attack)    June 2018  . Urinary retention     Past Surgical History:  Procedure Laterality Date  . CHOLECYSTECTOMY N/A 07/11/2015   Procedure: LAPAROSCOPIC CHOLECYSTECTOMY;  Surgeon:  Rolm Bookbinder, MD;  Location: [facility];  Service: General;  Laterality: N/A;  . ERCP N/A 07/09/2015   Procedure: ENDOSCOPIC RETROGRADE CHOLANGIOPANCREATOGRAPHY (ERCP);  Surgeon: Clarene Essex, MD;  Location[facility] ENDOSCOPY;  Service: Endoscopy;  Laterality: N/A;  . INGUINAL HERNIA REPAIR Left 04/07/2017   Procedure: HERNIA REPAIR INGUINAL ADULT;  Surgeon: Clayburn Pert, MD;  Location: [facility] ORS;  Service: General;  Laterality: Left;  . INSERTION OF MESH Left 04/07/2017   Procedure: INSERTION OF MESH;  Surgeon: Clayburn Pert, MD;  Location: [facility] ORS;  Service: General;  Laterality: Left;  . KYPHOPLASTY N/A 11/16/2018   Procedure: KYPHOPLASTY T12;  Surgeon: Hessie Knows, MD;  Location: [facility] ORS;  Service: Orthopedics;  Laterality: N/A;  . LEFT HEART CATH AND CORONARY ANGIOGRAPHY N/A 01/10/2018   Procedure: LEFT HEART CATH AND CORONARY ANGIOGRAPHY;  Surgeon: Dionisio David, MD;  Location: [facility];  Service: Cardiovascular;  Laterality: N/A;    Family History  Problem Relation Age of Onset  . Prostate cancer Brother   . Kidney disease Neg Hx   . Bladder Cancer Neg Hx     Social History   Tobacco Use  . Smoking status: Former Smoker    Packs/day: 0.50    Types: Cigarettes  . Smokeless tobacco: Never Used  . Tobacco comment: quit 40 years  Vaping Use  . Vaping  Use: Never used  Substance Use Topics  . Alcohol use: No    Alcohol/week: 0.0 standard drinks  . Drug use: No    No current facility-administered medications for this encounter.  Current Outpatient Medications:  .  acetaminophen (TYLENOL) 325 MG tablet, Take 650 mg by mouth every 6 (six) hours as needed., Disp: , Rfl:  .  albuterol (VENTOLIN HFA) 108 (90 Base) MCG/ACT inhaler, Inhale 1-2 puffs into the lungs every 4 (four) hours as needed for wheezing or shortness of breath., Disp: 6.7 g, Rfl: 0 .  amiodarone (PACERONE) 200 MG tablet, Take 200 mg by mouth at bedtime. , Disp: , Rfl:  .  carvedilol (COREG) 3.125 MG  tablet, Take 1 tablet (3.125 mg total) by mouth 2 (two) times daily., Disp:  , Rfl:  .  Cyanocobalamin (B-12) 2500 MCG TABS, Take 2,500 mg by mouth daily., Disp: , Rfl:  .  donepezil (ARICEPT) 10 MG tablet, Take 10 mg by mouth at bedtime., Disp: , Rfl:  .  ELIQUIS 5 MG TABS tablet, Take 5 mg by mouth 2 (two) times daily., Disp: , Rfl:  .  enalapril (VASOTEC) 2.5 MG tablet, Take 2.5 mg by mouth at bedtime. , Disp: , Rfl:  .  finasteride (PROSCAR) 5 MG tablet, TAKE 1 TABLET(5 MG) BY MOUTH DAILY, Disp: 90 tablet, Rfl: 3 .  furosemide (LASIX) 20 MG tablet, Take 20 mg by mouth daily as needed. For feet swelling., Disp: , Rfl:  .  isosorbide mononitrate (IMDUR) 30 MG 24 hr tablet, Take 30 mg by mouth at bedtime. , Disp: , Rfl:  .  loratadine (CLARITIN) 10 MG tablet, Take 10 mg by mouth daily as needed for allergies., Disp: , Rfl:  .  Melatonin 10 MG TABS, Take by mouth., Disp: , Rfl:  .  memantine (NAMENDA) 10 MG tablet, Take 10 mg by mouth 2 (two) times daily. , Disp: , Rfl:  .  mirtazapine (REMERON) 15 MG tablet, Take 15 mg by mouth at bedtime. , Disp: , Rfl:  .  Multiple Vitamin (MULTIVITAMIN WITH MINERALS) TABS tablet, Take 1 tablet by mouth daily., Disp: , Rfl:  .  rosuvastatin (CRESTOR) 40 MG tablet, Take 40 mg by mouth at bedtime., Disp: , Rfl:  .  tamsulosin (FLOMAX) 0.4 MG CAPS capsule, TAKE 1 CAPSULE BY MOUTH EVERY DAY, Disp: 90 capsule, Rfl: 3 .  amoxicillin-clavulanate (AUGMENTIN) 875-125 MG tablet, Take 1 tablet by mouth 2 (two) times daily for 7 days., Disp: 14 tablet, Rfl: 0 .  benzonatate (TESSALON) 200 MG capsule, Take 1 capsule (200 mg total) by mouth 3 (three) times daily as needed for cough., Disp: 30 capsule, Rfl: 0 .  predniSONE (DELTASONE) 20 MG tablet, Take 2 tablets (40 mg total) by mouth daily with breakfast for 5 days., Disp: 10 tablet, Rfl: 0  Allergies  Allergen Reactions  . Seroquel [Quetiapine Fumarate]     confusion  . Clindamycin Hcl Rash    Unknown reaction   Reaction: Unknown  Unknown reaction   . Sulfamethoxazole-Trimethoprim Rash     ROS  As noted in HPI.   Physical Exam  BP 115/72 (BP Location: Left Arm)   Pulse 70   Temp 98.3 F (36.8 C) (Oral)   SpO2 92% Comment: daughter reports this is normal.   Constitutional: Well developed, well nourished, no acute distress Eyes:  EOMI, conjunctiva normal bilaterally HENT: Normocephalic, atraumatic,mucus membranes moist.  Positive mucoid nasal congestion.  Normal turbinates.  No maxillary, frontal sinus tenderness.  No appreciable postnasal drip. Respiratory: Normal inspiratory effort, fair air movement.  Expiratory wheezing throughout especially on the left.  No rales or rhonchi Cardiovascular: Normal rate regular rhythm no murmurs rubs or gallops GI: nondistended skin: No rash, skin intact Musculoskeletal: no deformities Neurologic: Alert & oriented x 3, no focal neuro deficits Psychiatric: Speech and behavior appropriate   ED Course   Medications - No data to display  Orders Placed This Encounter  Procedures  . Resp Panel by RT-PCR (Flu A&B, Covid) Nasopharyngeal Swab    Standing Status:   Standing    Number of Occurrences:   1    Order Specific Question:   Is this test for diagnosis or screening    Answer:   Diagnosis of ill patient    Order Specific Question:   Symptomatic for COVID-19 as defined by CDC    Answer:   Yes    Order Specific Question:   Date of Symptom Onset    Answer:   08/08/2020    Order Specific Question:   Hospitalized for COVID-19    Answer:   No    Order Specific Question:   Admitted to ICU for COVID-19    Answer:   No    Order Specific Question:   Previously tested for COVID-19    Answer:   Yes    Order Specific Question:   Resident in a congregate (group) care setting    Answer:   No    Order Specific Question:   Employed in healthcare setting    Answer:   No    Order Specific Question:   Has patient completed COVID vaccination(s) (2 doses  of Pfizer/Moderna 1 dose of [employer])    Answer:   No  . DG Chest 2 View    Standing Status:   Standing    Number of Occurrences:   1    Order Specific Question:   Reason for Exam (SYMPTOM  OR DIAGNOSIS REQUIRED)    Answer:   hx COPD cough 2 weeks r/o pna effusion pulm edema  . Airborne precautions    Standing Status:   Standing    Number of Occurrences:   1    Results for orders placed or performed during the hospital encounter of 08/22/20 (from the past 24 hour(s))  Resp Panel by RT-PCR (Flu A&B, Covid) Nasopharyngeal Swab     Status: None   Collection Time: 08/22/20 12:29 PM   Specimen: Nasopharyngeal Swab; Nasopharyngeal(NP) swabs in vial transport medium  Result Value Ref Range   SARS Coronavirus 2 by RT PCR NEGATIVE NEGATIVE   Influenza A by PCR NEGATIVE NEGATIVE   Influenza B by PCR NEGATIVE NEGATIVE   DG Chest 2 View  Result Date: 08/22/2020 CLINICAL DATA:  COPD.  Cough. EXAM: CHEST - 2 VIEW COMPARISON:  CT 11/01/2019.  Chest x-ray 11/01/2019. FINDINGS: Mediastinum and hilar structures normal. Heart size normal. No focal infiltrate. Interim resolution of previously identified bilateral infiltrates. No pleural effusion. No pneumothorax noted on today's exam. Biapical pleural thickening consistent with scarring. Degenerative change thoracic spine. IMPRESSION: No acute cardiopulmonary disease. Interim resolution of previously identified bilateral pulmonary infiltrates. Electronically Signed   By: Marcello Moores  Register   On: 08/22/2020 12:45    ED Clinical Impression  1. COPD exacerbation [facility])      ED Assessment/Plan  Suspect COPD exacerbation.  Will check chest x-ray and Covid/flu as he is unvaccinated despite having Covid last December.  If normal, will treat as a  COPD exacerbation with prednisone 40 mg for 5 days, regularly scheduled albuterol with a spacer, daughter states they do not need a prescription for this, Mucinex, saline nasal irrigation for the nasal  congestion.  Augmentin as he has 2 out of 3 of the cardinal COPD symptoms-worsening cough and shortness of breath above baseline.  Tessalon as needed for the cough.  Reviewed imaging independently.  No acute cardiopulmonary disease.  Resolution of bilateral pulmonary infiltrates.  See radiology report for full details.  Covid, flu negative.  Plan as above.  Discussed labs, imaging, MDM, treatment plan, and plan for follow-up with family. Discussed sn/sx that should prompt return to the ED. family agrees with plan.   Meds ordered this encounter  Medications  . amoxicillin-clavulanate (AUGMENTIN) 875-125 MG tablet    Sig: Take 1 tablet by mouth 2 (two) times daily for 7 days.    Dispense:  14 tablet    Refill:  0  . benzonatate (TESSALON) 200 MG capsule    Sig: Take 1 capsule (200 mg total) by mouth 3 (three) times daily as needed for cough.    Dispense:  30 capsule    Refill:  0  . predniSONE (DELTASONE) 20 MG tablet    Sig: Take 2 tablets (40 mg total) by mouth daily with breakfast for 5 days.    Dispense:  10 tablet    Refill:  0    *This clinic note was created using [employer]. Therefore, there may be occasional mistakes despite careful proofreading.   ?    Melynda Ripple, MD 08/23/20 [phone]

## 2020-08-22 NOTE — Discharge Instructions (Addendum)
Covid and flu were both negative.  X-ray is improved from last time.  I am going to treat him for COPD exacerbation.  2 puffs from his albuterol inhaler using his spacer every 4 hours for 2 days, then every 6 hours for 2 days, then as needed.  You can back off on the frequency of the albuterol if he starts getting better.  Finish the prednisone.  Discontinue Zyrtec, start Mucinex.  I am starting him on Augmentin for COPD exacerbation.  Tessalon for the cough.  Follow-up with his primary care physician in 3 days if not getting any better.  Go to the ER for fevers above 100.4, if he is needing his albuterol inhaler every hour, if he gets worse, oxygen saturation consistently below 90%, or for other concerns.

## 2020-08-31 NOTE — Progress Notes (Signed)
09/01/2020 11:17 AM   [name] 1934-01-20 [id number]  Referring provider: [name], MD [place],  [place] [address]  Chief Complaint  Patient presents with  . Follow-up    unable to control his bladder    HPI: Asbury Hair is a 86 y.o. male with an urological history of BPH with LU TS, elevated PSA, epididymitis, urinary retention, nephrolithiasis, bladder stones and urethral strictures who presents today for urinary incontinence.   Patient's history is obtained from his daughter.    He has been experiencing a week and a half of urge incontinence.  This is a new symptom.  Patient denies any modifying or aggravating factors.  Patient denies any gross hematuria, dysuria or suprapubic/flank pain.  Patient denies any fevers, chills, nausea or vomiting.   UA is grossly infected.  PVR is 122 mL.    PMH: Past Medical History:  Diagnosis Date  . Aneurysm ([place])    Abdominal Aortic Aneurysm  . Atrial fibrillation ([place])   . Blockage of a bile duct   . BPH with obstruction/lower urinary tract symptoms   . Cellulitis of scrotum   . COPD (chronic obstructive pulmonary disease) ([place])   . Dementia ([place])   . Diverticulosis   . Dyspnea    with exertion  . Dysrhythmia   . Elevated PSA   . Epididymitis   . Gallstone   . Gross hematuria   . History of kidney stones   . Hyperlipidemia   . Kidney stones   . Nocturia   . Testicular pain   . TIA (transient ischemic attack)    June 2018  . Urinary retention     Surgical History: Past Surgical History:  Procedure Laterality Date  . CHOLECYSTECTOMY N/A 07/11/2015   Procedure: LAPAROSCOPIC CHOLECYSTECTOMY;  Surgeon: Rolm Bookbinder, MD;  Location: [place];  Service: General;  Laterality: N/A;  . ERCP N/A 07/09/2015   Procedure: ENDOSCOPIC RETROGRADE CHOLANGIOPANCREATOGRAPHY (ERCP);  Surgeon: Clarene Essex, MD;  Location: [facility];  Service: Endoscopy;  Laterality: N/A;  . INGUINAL HERNIA REPAIR  Left 04/07/2017   Procedure: HERNIA REPAIR INGUINAL ADULT;  Surgeon: Clayburn Pert, MD;  Location: [facility];  Service: General;  Laterality: Left;  . INSERTION OF MESH Left 04/07/2017   Procedure: INSERTION OF MESH;  Surgeon: Clayburn Pert, MD;  Location: [facility];  Service: General;  Laterality: Left;  . KYPHOPLASTY N/A 11/16/2018   Procedure: KYPHOPLASTY T12;  Surgeon: Hessie Knows, MD;  Location: [facility];  Service: Orthopedics;  Laterality: N/A;  . LEFT HEART CATH AND CORONARY ANGIOGRAPHY N/A 01/10/2018   Procedure: LEFT HEART CATH AND CORONARY ANGIOGRAPHY;  Surgeon: Dionisio David, MD;  Location: [place] [facility];  Service: Cardiovascular;  Laterality: N/A;    Home Medications:  Allergies as of 09/01/2020      Reactions   Seroquel [quetiapine Fumarate]    confusion   Clindamycin Hcl Rash   Unknown reaction  Reaction: Unknown  Unknown reaction    Sulfamethoxazole-trimethoprim Rash      Medication List       Accurate as of September 01, 2020 11:17 AM. If you have any questions, ask your nurse or doctor.        acetaminophen 325 MG tablet Commonly known as: TYLENOL Take 650 mg by mouth every 6 (six) hours as needed.   albuterol 108 (90 Base) MCG/ACT inhaler Commonly known as: VENTOLIN HFA Inhale 1-2 puffs into the lungs every 4 (four) hours as needed  for wheezing or shortness of breath.   amiodarone 200 MG tablet Commonly known as: PACERONE Take 200 mg by mouth at bedtime.   B-12 2500 MCG Tabs Take 2,500 mg by mouth daily.   benzonatate 200 MG capsule Commonly known as: TESSALON Take 1 capsule (200 mg total) by mouth 3 (three) times daily as needed for cough.   carvedilol 3.125 MG tablet Commonly known as: COREG Take 1 tablet (3.125 mg total) by mouth 2 (two) times daily.   donepezil 10 MG tablet Commonly known as: ARICEPT Take 10 mg by mouth at bedtime.   doxycycline 100 MG capsule Commonly known as: VIBRAMYCIN Take 1 capsule (100 mg total) by mouth  every 12 (twelve) hours. Started by: Zara Council, PA-C   Eliquis 5 MG Tabs tablet Generic drug: apixaban Take 5 mg by mouth 2 (two) times daily.   enalapril 2.5 MG tablet Commonly known as: VASOTEC Take 2.5 mg by mouth at bedtime.   finasteride 5 MG tablet Commonly known as: PROSCAR TAKE 1 TABLET(5 MG) BY MOUTH DAILY   furosemide 20 MG tablet Commonly known as: LASIX Take 20 mg by mouth daily as needed. For feet swelling.   isosorbide mononitrate 30 MG 24 hr tablet Commonly known as: IMDUR Take 30 mg by mouth at bedtime.   loratadine 10 MG tablet Commonly known as: CLARITIN Take 10 mg by mouth daily as needed for allergies.   Melatonin 10 MG Tabs Take by mouth.   memantine 10 MG tablet Commonly known as: NAMENDA Take 10 mg by mouth 2 (two) times daily.   mirtazapine 15 MG tablet Commonly known as: REMERON Take 15 mg by mouth at bedtime.   multivitamin with minerals Tabs tablet Take 1 tablet by mouth daily.   rosuvastatin 40 MG tablet Commonly known as: CRESTOR Take 40 mg by mouth at bedtime.   tamsulosin 0.4 MG Caps capsule Commonly known as: FLOMAX TAKE 1 CAPSULE BY MOUTH EVERY DAY       Allergies:  Allergies  Allergen Reactions  . Seroquel [Quetiapine Fumarate]     confusion  . Clindamycin Hcl Rash    Unknown reaction  Reaction: Unknown  Unknown reaction   . Sulfamethoxazole-Trimethoprim Rash    Family History: Family History  Problem Relation Age of Onset  . Prostate cancer Brother   . Kidney disease Neg Hx   . Bladder Cancer Neg Hx     Social History:  reports that he has quit smoking. His smoking use included cigarettes. He smoked 0.50 packs per day. He has never used smokeless tobacco. He reports that he does not drink alcohol and does not use drugs.  ROS: Pertinent ROS in HPI  Physical Exam: BP 131/72   Pulse 90   Ht 5\' 10"  (1.778 m)   Wt 193 lb (87.5 kg)   BMI 27.69 kg/m   Constitutional:  Well nourished. Alert and  oriented, No acute distress. HEENT: [place] AT, mask in place.  Trachea midline Cardiovascular: No clubbing, cyanosis, or edema. Respiratory: Normal respiratory effort, no increased work of breathing. Neurologic: Grossly intact, no focal deficits, moving all 4 extremities. Psychiatric: Normal mood and affect.  Laboratory Data: Lab Results  Component Value Date   WBC 8.2 11/05/2019   HGB 9.4 (L) 11/05/2019   HCT 29.8 (L) 11/05/2019   MCV 96.4 11/05/2019   PLT 187 11/05/2019    Lab Results  Component Value Date   CREATININE 1.30 (H) 03/14/2020     Lab Results  Component Value Date  HGBA1C 5.9 (H) 02/19/2017    Lab Results  Component Value Date   TSH 0.343 (L) 06/22/2014       Component Value Date/Time   CHOL 116 02/19/2017 0509   HDL 40 (L) 02/19/2017 0509   CHOLHDL 2.9 02/19/2017 0509   VLDL 11 02/19/2017 0509   LDLCALC 65 02/19/2017 0509    Lab Results  Component Value Date   AST 24 11/05/2019   Lab Results  Component Value Date   ALT 20 11/05/2019    Urinalysis Component     Latest Ref Rng & Units 09/01/2020  Color, Urine     YELLOW YELLOW  Appearance     CLEAR CLOUDY (A)  Specific Gravity, Urine     1.005 - 1.030 1.020  pH     5.0 - 8.0 7.0  Glucose, UA     NEGATIVE mg/dL NEGATIVE  Hgb urine dipstick     NEGATIVE TRACE (A)  Bilirubin Urine     NEGATIVE NEGATIVE  Ketones, ur     NEGATIVE mg/dL NEGATIVE  Protein     NEGATIVE mg/dL TRACE (A)  Nitrite     NEGATIVE POSITIVE (A)  Leukocytes,Ua     NEGATIVE SMALL (A)  RBC / HPF     0 - 5 RBC/hpf 0-5  WBC, UA     0 - 5 WBC/hpf >50  Bacteria, UA     NONE SEEN MANY (A)  Squamous Epithelial / LPF     0 - 5 0-5  WBC Clumps      PRESENT   I have reviewed the labs.   Pertinent Imaging: Results for GAYLAN, FAUVER (MRN [id number]) as of 09/01/2020 11:14  Ref. Range 09/01/2020 10:41  Scan Result Unknown 122 ml    Assessment & Plan:    1. UTI UA is grossly infected Urine is sent for  culture Starting doxycycline at this time, will adjust if necessary once culture results are available Discussed with daughter that the possible reason he gets UTIs is his incomplete bladder emptying, age and bladder stones. RTC in one month for KUB and UA recheck   2. Bladder stones Will obtain an KUB upon return for surveillance    Return for KUB and UA and office visit .  These notes generated with voice recognition software. I apologize for typographical errors.  Zara Council, PA-C  [facility] [address]  [place], [place] [address] [phone]

## 2020-09-01 ENCOUNTER — Encounter: Payer: Self-pay | Admitting: Urology

## 2020-09-01 ENCOUNTER — Other Ambulatory Visit: Payer: Self-pay

## 2020-09-01 ENCOUNTER — Ambulatory Visit (INDEPENDENT_AMBULATORY_CARE_PROVIDER_SITE_OTHER): Payer: Medicare HMO | Admitting: Urology

## 2020-09-01 ENCOUNTER — Other Ambulatory Visit: Payer: Self-pay | Admitting: *Deleted

## 2020-09-01 ENCOUNTER — Other Ambulatory Visit
Admission: RE | Admit: 2020-09-01 | Discharge: 2020-09-01 | Disposition: A | Discharge status: Home / Self Care | Payer: Medicare HMO | Source: Ambulatory Visit | Attending: Urology | Admitting: Urology

## 2020-09-01 VITALS — BP 131/72 | HR 90 | Ht 70.0 in | Wt 193.0 lb

## 2020-09-01 DIAGNOSIS — R350 Frequency of micturition: Secondary | ICD-10-CM | POA: Diagnosis not present

## 2020-09-01 DIAGNOSIS — R310 Gross hematuria: Secondary | ICD-10-CM | POA: Diagnosis present

## 2020-09-01 LAB — URINALYSIS, COMPLETE (UACMP) WITH MICROSCOPIC
Bilirubin Urine: NEGATIVE
Glucose, UA: NEGATIVE mg/dL
Ketones, ur: NEGATIVE mg/dL
Nitrite: POSITIVE — AB
Specific Gravity, Urine: 1.02 (ref 1.005–1.030)
WBC, UA: >50 WBC/hpf (ref 0–5)
pH: 7 (ref 5.0–8.0)

## 2020-09-01 LAB — BLADDER SCAN AMB NON-IMAGING: Scan Result: 122

## 2020-09-01 MED ORDER — DOXYCYCLINE HYCLATE 100 MG PO CAPS: 100.0000 mg | ORAL_CAPSULE | Freq: Two times a day (BID) | ORAL | 0 refills | Status: DC

## 2020-09-03 ENCOUNTER — Telehealth: Payer: Self-pay | Admitting: Family Medicine

## 2020-09-03 LAB — URINE CULTURE: Culture: >=100000 — AB

## 2020-09-03 NOTE — Telephone Encounter (Signed)
[name] notified and voiced understanding.

## 2020-09-03 NOTE — Telephone Encounter (Signed)
-----   Message from [name], PA-C sent at 09/03/2020  3:17 PM EST ----- Please let [name] know that her father's urine culture is positive for E. coli and to continue the doxycycline

## 2020-09-16 ENCOUNTER — Other Ambulatory Visit: Payer: Self-pay

## 2020-09-16 ENCOUNTER — Ambulatory Visit
Admission: RE | Admit: 2020-09-16 | Discharge: 2020-09-16 | Disposition: A | Discharge status: Home / Self Care | Payer: Medicare HMO | Source: Ambulatory Visit | Attending: Urology | Admitting: Urology

## 2020-09-16 ENCOUNTER — Ambulatory Visit (INDEPENDENT_AMBULATORY_CARE_PROVIDER_SITE_OTHER): Payer: Medicare HMO | Admitting: Urology

## 2020-09-16 ENCOUNTER — Encounter: Payer: Self-pay | Admitting: Urology

## 2020-09-16 VITALS — BP 115/68 | HR 66 | Ht 70.0 in | Wt 185.8 lb

## 2020-09-16 DIAGNOSIS — N210 Calculus in bladder: Secondary | ICD-10-CM | POA: Diagnosis not present

## 2020-09-16 DIAGNOSIS — R350 Frequency of micturition: Secondary | ICD-10-CM

## 2020-09-16 LAB — MICROSCOPIC EXAMINATION: Bacteria, UA: NONE SEEN

## 2020-09-16 LAB — BLADDER SCAN AMB NON-IMAGING: Scan Result: 39

## 2020-09-16 LAB — URINALYSIS, COMPLETE
Bilirubin, UA: NEGATIVE
Glucose, UA: NEGATIVE
Ketones, UA: NEGATIVE
Leukocytes,UA: NEGATIVE
Nitrite, UA: NEGATIVE
Protein,UA: NEGATIVE
RBC, UA: NEGATIVE
Specific Gravity, UA: 1.025 (ref 1.005–1.030)
Urobilinogen, Ur: 0.2 mg/dL (ref 0.2–1.0)
pH, UA: 6 (ref 5.0–7.5)

## 2020-09-16 NOTE — Progress Notes (Signed)
09/16/2020 4:03 PM   Chris Simon [birth year] [id number]  Referring provider: [name], MD [place],  [place] [address]  Chief Complaint  Patient presents with  . Urinary Frequency    HPI: Chris Simon is a 86 y.o. male with an urological history of BPH with LU TS, elevated PSA, epididymitis, urinary retention, nephrolithiasis, bladder stones and urethral strictures who presents today for urinary incontinence.   Patient's history is obtained from his daughter.    Patient was seen on September 01, 2020 for new onset of urge incontinence.  His urine culture was positive for E. coli resistant to ampicillin and ciprofloxacin.  He was empirically prescribed doxycycline as his daughter stated that was the medication that worked best for him.  PVR was 122 mL.    His incontinence continues to worsen.  He does stands up and it starts leaking all over.  He is wearing depends.  Patient denies any modifying or aggravating factors.  Patient denies any gross hematuria, dysuria or suprapubic/flank pain.  Patient denies any fevers, chills, nausea or vomiting.   His UA today is positive for 6-10 WBC's and 3-10 RBC's.  PVR is 39 mL.  KUB negative for stones.    PMH: Past Medical History:  Diagnosis Date  . Aneurysm ([place])    Abdominal Aortic Aneurysm  . Atrial fibrillation ([place])   . Blockage of a bile duct   . BPH with obstruction/lower urinary tract symptoms   . Cellulitis of scrotum   . COPD (chronic obstructive pulmonary disease) ([place])   . Dementia ([place])   . Diverticulosis   . Dyspnea    with exertion  . Dysrhythmia   . Elevated PSA   . Epididymitis   . Gallstone   . Gross hematuria   . History of kidney stones   . Hyperlipidemia   . Kidney stones   . Nocturia   . Testicular pain   . TIA (transient ischemic attack)    June 2018  . Urinary retention     Surgical History: Past Surgical History:  Procedure Laterality Date  . CHOLECYSTECTOMY N/A  07/11/2015   Procedure: LAPAROSCOPIC CHOLECYSTECTOMY;  Surgeon: Rolm Bookbinder, MD;  Location: [place];  Service: General;  Laterality: N/A;  . ERCP N/A 07/09/2015   Procedure: ENDOSCOPIC RETROGRADE CHOLANGIOPANCREATOGRAPHY (ERCP);  Surgeon: Clarene Essex, MD;  Location[facility];  Service: Endoscopy;  Laterality: N/A;  . INGUINAL HERNIA REPAIR Left 04/07/2017   Procedure: HERNIA REPAIR INGUINAL ADULT;  Surgeon: Clayburn Pert, MD;  Location: [facility];  Service: General;  Laterality: Left;  . INSERTION OF MESH Left 04/07/2017   Procedure: INSERTION OF MESH;  Surgeon: Clayburn Pert, MD;  Location: [facility];  Service: General;  Laterality: Left;  . KYPHOPLASTY N/A 11/16/2018   Procedure: KYPHOPLASTY T12;  Surgeon: Hessie Knows, MD;  Location: [facility];  Service: Orthopedics;  Laterality: N/A;  . LEFT HEART CATH AND CORONARY ANGIOGRAPHY N/A 01/10/2018   Procedure: LEFT HEART CATH AND CORONARY ANGIOGRAPHY;  Surgeon: Dionisio David, MD;  Location: [place] [facility];  Service: Cardiovascular;  Laterality: N/A;    Home Medications:  Allergies as of 09/16/2020      Reactions   Seroquel [quetiapine Fumarate]    confusion   Clindamycin Hcl Rash   Unknown reaction  Reaction: Unknown  Unknown reaction    Sulfamethoxazole-trimethoprim Rash      Medication List       Accurate as of September 16, 2020 11:59  PM. If you have any questions, ask your nurse or doctor.        STOP taking these medications   doxycycline 100 MG capsule Commonly known as: VIBRAMYCIN Stopped by: Zara Council, PA-C     TAKE these medications   acetaminophen 325 MG tablet Commonly known as: TYLENOL Take 650 mg by mouth every 6 (six) hours as needed.   albuterol 108 (90 Base) MCG/ACT inhaler Commonly known as: VENTOLIN HFA Inhale 1-2 puffs into the lungs every 4 (four) hours as needed for wheezing or shortness of breath.   amiodarone 200 MG tablet Commonly known as: PACERONE Take 200 mg by mouth at  bedtime.   B-12 2500 MCG Tabs Take 2,500 mg by mouth daily.   benzonatate 200 MG capsule Commonly known as: TESSALON Take 1 capsule (200 mg total) by mouth 3 (three) times daily as needed for cough.   carvedilol 3.125 MG tablet Commonly known as: COREG Take 1 tablet (3.125 mg total) by mouth 2 (two) times daily.   donepezil 10 MG tablet Commonly known as: ARICEPT Take 10 mg by mouth at bedtime.   Eliquis 5 MG Tabs tablet Generic drug: apixaban Take 5 mg by mouth 2 (two) times daily.   enalapril 2.5 MG tablet Commonly known as: VASOTEC Take 2.5 mg by mouth at bedtime.   finasteride 5 MG tablet Commonly known as: PROSCAR TAKE 1 TABLET(5 MG) BY MOUTH DAILY   furosemide 20 MG tablet Commonly known as: LASIX Take 20 mg by mouth daily as needed. For feet swelling.   isosorbide mononitrate 30 MG 24 hr tablet Commonly known as: IMDUR Take 30 mg by mouth at bedtime.   loratadine 10 MG tablet Commonly known as: CLARITIN Take 10 mg by mouth daily as needed for allergies.   Melatonin 10 MG Tabs Take by mouth.   memantine 10 MG tablet Commonly known as: NAMENDA Take 10 mg by mouth 2 (two) times daily.   mirtazapine 15 MG tablet Commonly known as: REMERON Take 15 mg by mouth at bedtime.   multivitamin with minerals Tabs tablet Take 1 tablet by mouth daily.   rosuvastatin 40 MG tablet Commonly known as: CRESTOR Take 40 mg by mouth at bedtime.   tamsulosin 0.4 MG Caps capsule Commonly known as: FLOMAX TAKE 1 CAPSULE BY MOUTH EVERY DAY       Allergies:  Allergies  Allergen Reactions  . Seroquel [Quetiapine Fumarate]     confusion  . Clindamycin Hcl Rash    Unknown reaction  Reaction: Unknown  Unknown reaction   . Sulfamethoxazole-Trimethoprim Rash    Family History: Family History  Problem Relation Age of Onset  . Prostate cancer Brother   . Kidney disease Neg Hx   . Bladder Cancer Neg Hx     Social History:  reports that he has quit smoking. His  smoking use included cigarettes. He smoked 0.50 packs per day. He has never used smokeless tobacco. He reports that he does not drink alcohol and does not use drugs.  ROS: Pertinent ROS in HPI  Physical Exam: BP 115/68   Pulse 66   Ht 5\' 10"  (1.778 m)   Wt 185 lb 12.8 oz (84.3 kg)   BMI 26.66 kg/m   Constitutional:  Well nourished. Alert and oriented, No acute distress. HEENT: [place] AT, mask in place.  Trachea midline Cardiovascular: No clubbing, cyanosis, or edema. Respiratory: Normal respiratory effort, no increased work of breathing. Neurologic: Grossly intact, no focal deficits, moving all 4 extremities. Psychiatric:  Normal mood and affect.  Laboratory Data: Lab Results  Component Value Date   WBC 8.2 11/05/2019   HGB 9.4 (L) 11/05/2019   HCT 29.8 (L) 11/05/2019   MCV 96.4 11/05/2019   PLT 187 11/05/2019    Lab Results  Component Value Date   CREATININE 1.30 (H) 03/14/2020     Lab Results  Component Value Date   HGBA1C 5.9 (H) 02/19/2017    Lab Results  Component Value Date   TSH 0.343 (L) 06/22/2014       Component Value Date/Time   CHOL 116 02/19/2017 0509   HDL 40 (L) 02/19/2017 0509   CHOLHDL 2.9 02/19/2017 0509   VLDL 11 02/19/2017 0509   LDLCALC 65 02/19/2017 0509    Lab Results  Component Value Date   AST 24 11/05/2019   Lab Results  Component Value Date   ALT 20 11/05/2019    Urinalysis Component     Latest Ref Rng & Units 09/16/2020  Specific Gravity, UA     1.005 - 1.030 1.025  pH, UA     5.0 - 7.5 6.0  Color, UA     Yellow Yellow  Appearance Ur     Clear Hazy (A)  Leukocytes,UA     Negative Negative  Protein,UA     Negative/Trace Negative  Glucose, UA     Negative Negative  Ketones, UA     Negative Negative  RBC, UA     Negative Negative  Bilirubin, UA     Negative Negative  Urobilinogen, Ur     0.2 - 1.0 mg/dL 0.2  Nitrite, UA     Negative Negative  Microscopic Examination      See below:   Component     Latest  Ref Rng & Units 09/16/2020          WBC, UA     0 - 5 /hpf 6-10 (A)  RBC     0 - 2 /hpf 3-10 (A)  Epithelial Cells (non renal)     0 - 10 /hpf 0-10  Renal Epithel, UA     None seen /hpf 0-10 (A)  Casts     None seen /lpf Present (A)  Cast Type     N/A Hyaline casts  Mucus, UA     Not Estab. Present (A)  Bacteria, UA     None seen/Few None seen   I have reviewed the labs.   Pertinent Imaging: Results for THEADORE, POIST (MRN [id number]) as of 09/16/2020 14:47  Ref. Range 09/16/2020 13:18  Scan Result Unknown 39     Assessment & Plan:    1. Urge incontinence We discussed sending the urine for culture to see if another antibiotic would be more effective if urine culture is positive for infection.  We also discussed an overactive bladder agent, but he has urethral strictures and we were concerned he may go into urinary retention requiring catheter placement which would be daunting.   The daughter is also not wanting her father to undergo cystoscopic examination in the operating room due to his advanced age and dementia.   We will regroup once the culture results are in and discuss further with Dr. Erlene Quan  Return for pending urine culture results .  These notes generated with voice recognition software. I apologize for typographical errors.  Zara Council, PA-C  [facility] [address] [place], [place] [address] [phone]

## 2020-09-25 LAB — CULTURE, URINE COMPREHENSIVE

## 2020-09-29 ENCOUNTER — Telehealth: Payer: Self-pay | Admitting: Family Medicine

## 2020-09-29 MED ORDER — NITROFURANTOIN MONOHYD MACRO 100 MG PO CAPS: 100.0000 mg | ORAL_CAPSULE | Freq: Two times a day (BID) | ORAL | 0 refills | Status: DC

## 2020-09-29 NOTE — Telephone Encounter (Signed)
-----   Message from Harle Battiest, PA-C sent at 09/29/2020  8:08 AM EST ----- Please let Chris Simon daughter, Chris Simon, know that his urine culture is positive for infection.  We need him to start Macrobid 100 mg, twice daily for seven days.

## 2020-09-29 NOTE — Telephone Encounter (Signed)
[name] notified and voiced understanding. She will pick up ABX.

## 2020-10-01 ENCOUNTER — Telehealth: Payer: Self-pay | Admitting: Urology

## 2020-10-01 DIAGNOSIS — N210 Calculus in bladder: Secondary | ICD-10-CM

## 2020-10-01 NOTE — Telephone Encounter (Signed)
Please place order for KUB and UA for appointment in [facility] on Monday, 10/06/20

## 2020-10-02 NOTE — Telephone Encounter (Signed)
Orders placed.

## 2020-10-03 ENCOUNTER — Telehealth: Payer: Self-pay | Admitting: Primary Care

## 2020-10-03 NOTE — Telephone Encounter (Signed)
Call to schedule PC f/u. Daughter states she is sick today but to make appt for several weeks out  and call back to confirm.

## 2020-10-06 ENCOUNTER — Ambulatory Visit: Payer: Medicare HMO | Admitting: Urology

## 2020-10-12 NOTE — Progress Notes (Deleted)
10/13/2020 11:15 AM   Chris Simon Chris Simon 01-01-1934 [id number]  Referring provider: [name], [place],   [id number]  No chief complaint on file.  Urological history 1. BPH with LU TS - severe prostatomegaly  - taking tamsulosin 0.4 mg daily and finasteride 5 mg daily - PVR's minimal  2. Urethral strictures - appreciated during cysto in 03/2020 - bulbar stricture unable to pass scope - no intervention at this time due to advanced age and co-morbidities  3. Nephrolithiasis - CT hematuria workup on 03/14/2020 revealed small nonobstructive calculus of the midportion of the left kidney  4. Bladder stones - CT hematuria workup on 03/14/2020 there were two small adjacent calculi in the dependent left urinary bladder adjacent to the ureteropelvic junction measuring 2-3 mm - not appreciated on KUB 08/2020  5. rUTI's - + Corynebacterium species and Staphylococcus epidermidis resistant to oxacillin, penicillin and tetracycline treated with nitrofurantoin 100 mg, BID x seven days on 09/16/2020 - + E. Coli resistant to ampicillin and ciprofloxacin treated with doxycycline 100 mg, BID x seven days on 09/01/2020 - + E. Coli resistant to ampicillin, ciprofloxacin and levofloxacin treated with doxycycline 100 mg, BID x thirty days on 07/17/2020 - + E. Coli resistant to ampicillin, ciprofloxacin and levofloxacin treated with Augmentin 875/125, BID x twelve days on 05/26/2020 - + E. Coli resistant to ampicillin, ciprofloxacin and levofloxacin treated with nitrofurantoin 100 mg, BID x ten days on 04/01/2020 - + E. Coli resistant to ampicillin, ciprofloxacin and levofloxacin and morganella morganii resistant Augmentin, ampicillin, cefazolin, ciprofloxacin, levofloxacin and nitrofurantoin treated with vancomycin, cefepime and cefdinir 300 mg, BID to complete 8 days of antibiotic on 02/08/2020 - + E. Coli resistant to ampicillin, cefazolin, cefepime, ceftriaxone,  cefuroxime, ciprofloxacin, levofloxacin,, tetracycline and trimethoprim/sulfa treated with Augemtin 875/125, BID for 8 days on 12/24/2019  HPI: Chris Simon is a 87 y.o. male who presents today for follow up on urge incontinence    PMH: Past Medical History:  Diagnosis Date  . Aneurysm ([place])    Abdominal Aortic Aneurysm  . Atrial fibrillation ([place])   . Blockage of a bile duct   . BPH with obstruction/lower urinary tract symptoms   . Cellulitis of scrotum   . COPD (chronic obstructive pulmonary disease) ([place])   . Dementia ([place])   . Diverticulosis   . Dyspnea    with exertion  . Dysrhythmia   . Elevated PSA   . Epididymitis   . Gallstone   . Gross hematuria   . History of kidney stones   . Hyperlipidemia   . Kidney stones   . Nocturia   . Testicular pain   . TIA (transient ischemic attack)    June 2018  . Urinary retention     Surgical History: Past Surgical History:  Procedure Laterality Date  . CHOLECYSTECTOMY N/A 07/11/2015   Procedure: LAPAROSCOPIC CHOLECYSTECTOMY;  Surgeon: Rolm Bookbinder, MD;  Location: [place];  Service: General;  Laterality: N/A;  . ERCP N/A 07/09/2015   Procedure: ENDOSCOPIC RETROGRADE CHOLANGIOPANCREATOGRAPHY (ERCP);  Surgeon: Clarene Essex, MD;  Location: [facility] ENDOSCOPY;  Service: Endoscopy;  Laterality: N/A;  . INGUINAL HERNIA REPAIR Left 04/07/2017   Procedure: HERNIA REPAIR INGUINAL ADULT;  Surgeon: Clayburn Pert, MD;  Location: [facility] ORS;  Service: General;  Laterality: Left;  . INSERTION OF MESH Left 04/07/2017   Procedure: INSERTION OF MESH;  Surgeon: Clayburn Pert, MD;  Location: [facility] ORS;  Service: General;  Laterality: Left;  . KYPHOPLASTY N/A  11/16/2018   Procedure: KYPHOPLASTY T12;  Surgeon: Hessie Knows, MD;  Location: [facility] ORS;  Service: Orthopedics;  Laterality: N/A;  . LEFT HEART CATH AND CORONARY ANGIOGRAPHY N/A 01/10/2018   Procedure: LEFT HEART CATH AND CORONARY ANGIOGRAPHY;  Surgeon: Dionisio David, MD;  Location: [place] [facility];  Service: Cardiovascular;  Laterality: N/A;    Home Medications:  Allergies as of 10/13/2020      Reactions   Seroquel [quetiapine Fumarate]    confusion   Clindamycin Hcl Rash   Unknown reaction  Reaction: Unknown  Unknown reaction    Sulfamethoxazole-trimethoprim Rash      Medication List       Accurate as of October 12, 2020 11:15 AM. If you have any questions, ask your nurse or doctor.        acetaminophen 325 MG tablet Commonly known as: TYLENOL Take 650 mg by mouth every 6 (six) hours as needed.   albuterol 108 (90 Base) MCG/ACT inhaler Commonly known as: VENTOLIN HFA Inhale 1-2 puffs into the lungs every 4 (four) hours as needed for wheezing or shortness of breath.   amiodarone 200 MG tablet Commonly known as: PACERONE Take 200 mg by mouth at bedtime.   B-12 2500 MCG Tabs Take 2,500 mg by mouth daily.   benzonatate 200 MG capsule Commonly known as: TESSALON Take 1 capsule (200 mg total) by mouth 3 (three) times daily as needed for cough.   carvedilol 3.125 MG tablet Commonly known as: COREG Take 1 tablet (3.125 mg total) by mouth 2 (two) times daily.   donepezil 10 MG tablet Commonly known as: ARICEPT Take 10 mg by mouth at bedtime.   Eliquis 5 MG Tabs tablet Generic drug: apixaban Take 5 mg by mouth 2 (two) times daily.   enalapril 2.5 MG tablet Commonly known as: VASOTEC Take 2.5 mg by mouth at bedtime.   finasteride 5 MG tablet Commonly known as: PROSCAR TAKE 1 TABLET(5 MG) BY MOUTH DAILY   furosemide 20 MG tablet Commonly known as: LASIX Take 20 mg by mouth daily as needed. For feet swelling.   isosorbide mononitrate 30 MG 24 hr tablet Commonly known as: IMDUR Take 30 mg by mouth at bedtime.   loratadine 10 MG tablet Commonly known as: CLARITIN Take 10 mg by mouth daily as needed for allergies.   Melatonin 10 MG Tabs Take by mouth.   memantine 10 MG tablet Commonly known as: NAMENDA Take 10 mg by mouth 2 (two)  times daily.   mirtazapine 15 MG tablet Commonly known as: REMERON Take 15 mg by mouth at bedtime.   multivitamin with minerals Tabs tablet Take 1 tablet by mouth daily.   nitrofurantoin (macrocrystal-monohydrate) 100 MG capsule Commonly known as: MACROBID Take 1 capsule (100 mg total) by mouth every 12 (twelve) hours.   rosuvastatin 40 MG tablet Commonly known as: CRESTOR Take 40 mg by mouth at bedtime.   tamsulosin 0.4 MG Caps capsule Commonly known as: FLOMAX TAKE 1 CAPSULE BY MOUTH EVERY DAY       Allergies:  Allergies  Allergen Reactions  . Seroquel [Quetiapine Fumarate]     confusion  . Clindamycin Hcl Rash    Unknown reaction  Reaction: Unknown  Unknown reaction   . Sulfamethoxazole-Trimethoprim Rash    Family History: Family History  Problem Relation Age of Onset  . Prostate cancer Brother   . Kidney disease Neg Hx   . Bladder Cancer Neg Hx     Social History:  reports  that he has quit smoking. His smoking use included cigarettes. He smoked 0.50 packs per day. He has never used smokeless tobacco. He reports that he does not drink alcohol and does not use drugs.  ROS: Pertinent ROS in HPI  Physical Exam: There were no vitals taken for this visit.  Constitutional:  Well nourished. Alert and oriented, No acute distress. HEENT: [place] AT, moist mucus membranes.  Trachea midline Cardiovascular: No clubbing, cyanosis, or edema. Respiratory: Normal respiratory effort, no increased work of breathing. GI: Abdomen is soft, non tender, non distended, no abdominal masses. Liver and spleen not palpable.  No hernias appreciated.  Stool sample for occult testing is not indicated.   GU: No CVA tenderness.  No bladder fullness or masses.  Patient with circumcised/uncircumcised phallus. ***Foreskin easily retracted***  Urethral meatus is patent.  No penile discharge. No penile lesions or rashes. Scrotum without lesions, cysts, rashes and/or edema.  Testicles are located  scrotally bilaterally. No masses are appreciated in the testicles. Left and right epididymis are normal. Rectal: Patient with  normal sphincter tone. Anus and perineum without scarring or rashes. No rectal masses are appreciated. Prostate is approximately *** grams, *** nodules are appreciated. Seminal vesicles are normal. Skin: No rashes, bruises or suspicious lesions. Lymph: No inguinal adenopathy. Neurologic: Grossly intact, no focal deficits, moving all 4 extremities. Psychiatric: Normal mood and affect.  Laboratory Data: Lab Results  Component Value Date   WBC 8.2 11/05/2019   HGB 9.4 (L) 11/05/2019   HCT 29.8 (L) 11/05/2019   MCV 96.4 11/05/2019   PLT 187 11/05/2019    Lab Results  Component Value Date   CREATININE 1.30 (H) 03/14/2020     Lab Results  Component Value Date   HGBA1C 5.9 (H) 02/19/2017    Lab Results  Component Value Date   TSH 0.343 (L) 06/22/2014       Component Value Date/Time   CHOL 116 02/19/2017 0509   HDL 40 (L) 02/19/2017 0509   CHOLHDL 2.9 02/19/2017 0509   VLDL 11 02/19/2017 0509   LDLCALC 65 02/19/2017 0509    Lab Results  Component Value Date   AST 24 11/05/2019   Lab Results  Component Value Date   ALT 20 11/05/2019    Urinalysis *** I have reviewed the labs.   Pertinent Imaging: ***    Assessment & Plan:    1. Urge incontinence We discussed sending the urine for culture to see if another antibiotic would be more effective if urine culture is positive for infection.  We also discussed an overactive bladder agent, but he has urethral strictures and we were concerned he may go into urinary retention requiring catheter placement which would be daunting.   The daughter is also not wanting her father to undergo cystoscopic examination in the operating room due to his advanced age and dementia.   We will regroup once the culture results are in and discuss further with Dr. Erlene Quan  No follow-ups on file.  These notes  generated with voice recognition software. I apologize for typographical errors.  Zara Council, PA-C  [facility] [address]  [place], [place] [address] [phone]

## 2020-10-13 ENCOUNTER — Ambulatory Visit: Payer: Medicare HMO | Admitting: Urology

## 2020-10-13 DIAGNOSIS — N3941 Urge incontinence: Secondary | ICD-10-CM

## 2020-10-15 ENCOUNTER — Other Ambulatory Visit: Payer: Medicare HMO | Admitting: Primary Care

## 2020-10-15 ENCOUNTER — Telehealth: Payer: Self-pay | Admitting: Primary Care

## 2020-10-15 ENCOUNTER — Other Ambulatory Visit: Payer: Self-pay

## 2020-10-15 NOTE — Telephone Encounter (Signed)
Spoke with patient's daughter, [name], to reschedule today's Palliative f/u visit and this was rescheduled for 10/27/20 @ 3 PM

## 2020-10-24 ENCOUNTER — Encounter: Payer: Self-pay | Admitting: Urology

## 2020-10-24 ENCOUNTER — Other Ambulatory Visit
Admission: RE | Admit: 2020-10-24 | Discharge: 2020-10-24 | Disposition: A | Discharge status: Home / Self Care | Payer: Medicare HMO | Attending: Urology | Admitting: Urology

## 2020-10-24 ENCOUNTER — Ambulatory Visit (INDEPENDENT_AMBULATORY_CARE_PROVIDER_SITE_OTHER): Payer: Medicare HMO | Admitting: Urology

## 2020-10-24 ENCOUNTER — Other Ambulatory Visit: Payer: Self-pay

## 2020-10-24 VITALS — BP 103/64 | HR 73 | Temp 98.6°F | Ht 72.0 in | Wt 185.0 lb

## 2020-10-24 DIAGNOSIS — E860 Dehydration: Secondary | ICD-10-CM | POA: Diagnosis not present

## 2020-10-24 DIAGNOSIS — R339 Retention of urine, unspecified: Secondary | ICD-10-CM

## 2020-10-24 DIAGNOSIS — R310 Gross hematuria: Secondary | ICD-10-CM | POA: Diagnosis present

## 2020-10-24 LAB — CBC WITH DIFFERENTIAL/PLATELET
Abs Immature Granulocytes: 0.1 10*3/uL — ABNORMAL HIGH (ref 0.00–0.07)
Basophils Absolute: 0 10*3/uL (ref 0.0–0.1)
Basophils Relative: 0 %
Eosinophils Absolute: 0.3 10*3/uL (ref 0.0–0.5)
Eosinophils Relative: 2 %
HCT: 42.3 % (ref 39.0–52.0)
Hemoglobin: 13 g/dL (ref 13.0–17.0)
Immature Granulocytes: 1 %
Lymphocytes Relative: 10 %
Lymphs Abs: 1.3 10*3/uL (ref 0.7–4.0)
MCH: 29.4 pg (ref 26.0–34.0)
MCHC: 30.7 g/dL (ref 30.0–36.0)
MCV: 95.7 fL (ref 80.0–100.0)
Monocytes Absolute: 1.3 10*3/uL — ABNORMAL HIGH (ref 0.1–1.0)
Monocytes Relative: 10 %
Neutro Abs: 10.3 10*3/uL — ABNORMAL HIGH (ref 1.7–7.7)
Neutrophils Relative %: 77 %
Platelets: 162 10*3/uL (ref 150–400)
RBC: 4.42 MIL/uL (ref 4.22–5.81)
RDW: 14.3 % (ref 11.5–15.5)
WBC: 13.2 10*3/uL — ABNORMAL HIGH (ref 4.0–10.5)
nRBC: 0 % (ref 0.0–0.2)

## 2020-10-24 LAB — URINALYSIS, COMPLETE
Bilirubin, UA: NEGATIVE
Nitrite, UA: NEGATIVE
Specific Gravity, UA: 1.025 (ref 1.005–1.030)
Urobilinogen, Ur: 2 mg/dL — ABNORMAL HIGH (ref 0.2–1.0)
pH, UA: 6 (ref 5.0–7.5)

## 2020-10-24 LAB — MICROSCOPIC EXAMINATION: RBC, Urine: >30 /hpf — AB (ref 0–2)

## 2020-10-24 LAB — BASIC METABOLIC PANEL
Anion gap: 12 (ref 5–15)
BUN: 30 mg/dL — ABNORMAL HIGH (ref 8–23)
CO2: 28 mmol/L (ref 22–32)
Calcium: 8.5 mg/dL — ABNORMAL LOW (ref 8.9–10.3)
Chloride: 99 mmol/L (ref 98–111)
Creatinine, Ser: 1.38 mg/dL — ABNORMAL HIGH (ref 0.61–1.24)
GFR, Estimated: 50 mL/min — ABNORMAL LOW (ref >60)
Glucose, Bld: 104 mg/dL — ABNORMAL HIGH (ref 70–99)
Potassium: 4 mmol/L (ref 3.5–5.1)
Sodium: 139 mmol/L (ref 135–145)

## 2020-10-24 LAB — LACTIC ACID, PLASMA: Lactic Acid, Venous: 1.3 mmol/L (ref 0.5–1.9)

## 2020-10-24 MED ORDER — NITROFURANTOIN MONOHYD MACRO 100 MG PO CAPS: 100.0000 mg | ORAL_CAPSULE | Freq: Every day | ORAL | 0 refills | Status: DC

## 2020-10-24 NOTE — Progress Notes (Signed)
10/24/2020 10:59 PM   Chris Simon Chris Simon 12-23-1933 [id number]  Referring provider: [name], [place],  [place] [address]  Chief Complaint  Patient presents with  . Dysuria   Urological history 1. High risk hematuria - former smoker - CTU 02/2020 small nonobstructive calculus of the midportion of the left kidney. Two small adjacent calculi in the dependent left urinary bladder adjacent to the ureteropelvic junction measuring 2-3 mm. No hydronephrosis.  No evidence of mass or urinary tract filling defect.  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  The second stricture was more dense in appearance, unable to advance scope beyond this level - UA > 30 RBC's likely secondary to urethral dilations during recent hospitalization and catheter   2. Bulbar strictures - admitted on 10/17/2020 for COPD exacerbation - bladder scan 600 mL, nursing staff unable to place catheter - bedside dilation was performed to 18Fr and 14Fr council catheter was placed - seen in [facility] ED in clot retention and 14Fr catheter was removed and an 18Fr coude cathter was able to be placed  3. Elevated PSA - 8.1 in 2016 - screening has been discontinued  4. BPH with LU TS - severe prostatomegaly on CTU 02/2020 - managed with finasteride 5 mg daily and tamsulosin 0.4 mg daily   HPI: Chris Simon is a 87 y.o. male who presents today with his daughter, Chris Simon, for complaints of burning, pain with urination and lower abdominal pain.  His daughter gives most of the history due to his dementia.  He has been experiencing lower abdominal pain and burning throughout the penile area for the last few days.  He has a catheter in place draining dark, cloudy orange urine.  There is 75 cc of urine in the leg bag at this time.  The  daughter states she emptied it this morning with about 500 output during the night.  Patient denies any modifying or aggravating factors.  Patient denies any flank pain.  Patient denies any fevers, chills, nausea or vomiting.   His catheter is plugged to obtain a urine for specimen, but after 30 minutes no urine was produced.  Patient was also found to have a BP of 86/52.  We then had the patient drink water and he was able to consume 48 ounces of water while in the office.  His blood pressure then decreased to 103/64  His urinalysis taken directly from the bag was positive for 6-10 WBCs, greater than 30 RBCs, calcium oxalate crystals and a few bacteria.  His urinalysis from the catheter was positive for 0-5 WBCs, greater than 30 RBCs and a few bacteria.  His UA from January 21 was positive for greater than 100 RBCs 10 WBCs and a few bacteria and urine culture was negative.  We ordered a stat BMP, CBC and lactic acid and results pointed to dehydration with a BUN of 30 with only slight increase in creatinine of 1.38 from 1.30 four days ago.    Lactic acid was normal.  WBC slightly increased from 12.1 on January 26 to 13.2.   PMH: Past Medical History:  Diagnosis Date  . Aneurysm ([place])    Abdominal Aortic Aneurysm  . Atrial fibrillation ([place])   . Blockage of a bile duct   . BPH with obstruction/lower urinary tract symptoms   . Cellulitis  of scrotum   . COPD (chronic obstructive pulmonary disease) ([place])   . Dementia ([place])   . Diverticulosis   . Dyspnea    with exertion  . Dysrhythmia   . Elevated PSA   . Epididymitis   . Gallstone   . Gross hematuria   . History of kidney stones   . Hyperlipidemia   . Kidney stones   . Nocturia   . Testicular pain   . TIA (transient ischemic attack)    June 2018  . Urinary retention     Surgical History: Past Surgical History:  Procedure Laterality Date  . CHOLECYSTECTOMY N/A 07/11/2015   Procedure: LAPAROSCOPIC CHOLECYSTECTOMY;   Surgeon: Rolm Bookbinder, MD;  Location: [place];  Service: General;  Laterality: N/A;  . ERCP N/A 07/09/2015   Procedure: ENDOSCOPIC RETROGRADE CHOLANGIOPANCREATOGRAPHY (ERCP);  Surgeon: Clarene Essex, MD;  Location: [facility] ENDOSCOPY;  Service: Endoscopy;  Laterality: N/A;  . INGUINAL HERNIA REPAIR Left 04/07/2017   Procedure: HERNIA REPAIR INGUINAL ADULT;  Surgeon: Clayburn Pert, MD;  Location: [facility] OR[facility];  Service: General;  Laterality: Left;  . INSERTION OF MESH Left 04/07/2017   Procedure: INSERTION OF MESH;  Surgeon: Clayburn Pert, MD;  Location: [facility] OR[facility];  Service: General;  Laterality: Left;  . KYPHOPLASTY N/A 11/16/2018   Procedure: KYPHOPLASTY T12;  Surgeon: Hessie Knows, MD;  Location: [facility] ORS;  Service: Orthopedics;  Laterality: N/A;  . LEFT HEART CATH AND CORONARY ANGIOGRAPHY N/A 01/10/2018   Procedure: LEFT HEART CATH AND CORONARY ANGIOGRAPHY;  Surgeon: Dionisio David, MD;  Location: [place] [facility];  Service: Cardiovascular;  Laterality: N/A;    Home Medications:  Allergies as of 10/24/2020      Reactions   Seroquel [quetiapine Fumarate]    confusion   Clindamycin Hcl Rash   Unknown reaction  Reaction: Unknown  Unknown reaction    Sulfamethoxazole-trimethoprim Rash      Medication List       Accurate as of October 24, 2020 11:59 PM. If you have any questions, ask your nurse or doctor.        acetaminophen 325 MG tablet Commonly known as: TYLENOL Take 650 mg by mouth every 6 (six) hours as needed.   albuterol 108 (90 Base) MCG/ACT inhaler Commonly known as: VENTOLIN HFA Inhale 1-2 puffs into the lungs every 4 (four) hours as needed for wheezing or shortness of breath.   amiodarone 200 MG tablet Commonly known as: PACERONE Take 200 mg by mouth at bedtime.   B-12 2500 MCG Tabs Take 2,500 mg by mouth daily.   benzonatate 200 MG capsule Commonly known as: TESSALON Take 1 capsule (200 mg total) by mouth 3 (three) times daily as needed for cough.    carvedilol 3.125 MG tablet Commonly known as: COREG Take 1 tablet (3.125 mg total) by mouth 2 (two) times daily.   donepezil 10 MG tablet Commonly known as: ARICEPT Take 10 mg by mouth at bedtime.   Eliquis 5 MG Tabs tablet Generic drug: apixaban Take 5 mg by mouth 2 (two) times daily.   enalapril 2.5 MG tablet Commonly known as: VASOTEC Take 2.5 mg by mouth at bedtime.   finasteride 5 MG tablet Commonly known as: PROSCAR TAKE 1 TABLET(5 MG) BY MOUTH DAILY   furosemide 20 MG tablet Commonly known as: LASIX Take 20 mg by mouth daily as needed. For feet swelling.   isosorbide mononitrate 30 MG 24 hr tablet Commonly known as: IMDUR Take 30 mg by mouth at bedtime.  loratadine 10 MG tablet Commonly known as: CLARITIN Take 10 mg by mouth daily as needed for allergies.   Melatonin 10 MG Tabs Take by mouth.   memantine 10 MG tablet Commonly known as: NAMENDA Take 10 mg by mouth 2 (two) times daily.   mirtazapine 15 MG tablet Commonly known as: REMERON Take 15 mg by mouth at bedtime.   multivitamin with minerals Tabs tablet Take 1 tablet by mouth daily.   nitrofurantoin (macrocrystal-monohydrate) 100 MG capsule Commonly known as: MACROBID Take 1 capsule (100 mg total) by mouth at bedtime. What changed: when to take this Changed by: Brayson Livesey, PA-C   rosuvastatin 40 MG tablet Commonly known as: CRESTOR Take 40 mg by mouth at bedtime.   tamsulosin 0.4 MG Caps capsule Commonly known as: FLOMAX TAKE 1 CAPSULE BY MOUTH EVERY DAY       Allergies:  Allergies  Allergen Reactions  . Seroquel [Quetiapine Fumarate]     confusion  . Clindamycin Hcl Rash    Unknown reaction  Reaction: Unknown  Unknown reaction   . Sulfamethoxazole-Trimethoprim Rash    Family History: Family History  Problem Relation Age of Onset  . Prostate cancer Brother   . Kidney disease Neg Hx   . Bladder Cancer Neg Hx     Social History:  reports that he has quit smoking.  His smoking use included cigarettes. He smoked 0.50 packs per day. He has never used smokeless tobacco. He reports that he does not drink alcohol and does not use drugs.  ROS: Pertinent ROS in HPI  Physical Exam: BP 103/64   Pulse 73   Temp 98.6 F (37 C) (Oral)   Ht 6' (1.829 m)   Wt 185 lb (83.9 kg)   BMI 25.09 kg/m   Constitutional:  Well nourished. Alert and oriented, No acute distress. HEENT: [place] AT, mask in place.  Trachea midline Cardiovascular: No clubbing, cyanosis, or edema. Respiratory: Normal respiratory effort, no increased work of breathing. GU: No CVA tenderness.  No bladder fullness or masses.  Foley in place.  Neurologic: Grossly intact, no focal deficits, moving all 4 extremities. Psychiatric: Normal mood and affect.  Laboratory Data: Results for orders placed or performed in visit on 10/24/20  Microscopic Examination   Urine  Result Value Ref Range   WBC, UA 0-5 0 - 5 /hpf   RBC >30 (A) 0 - 2 /hpf   Epithelial Cells (non renal) 0-10 0 - 10 /hpf   Bacteria, UA Few None seen/Few  Urinalysis, Complete  Result Value Ref Range   Specific Gravity, UA 1.025 1.005 - 1.030   pH, UA 6.0 5.0 - 7.5   Color, UA Brown (A) Yellow   Appearance Ur Cloudy (A) Clear   Leukocytes,UA Trace (A) Negative   Protein,UA 3+ (A) Negative/Trace   Glucose, UA Trace (A) Negative   Ketones, UA Trace (A) Negative   RBC, UA 3+ (A) Negative   Bilirubin, UA Negative Negative   Urobilinogen, Ur 2.0 (H) 0.2 - 1.0 mg/dL   Nitrite, UA Negative Negative   Microscopic Examination See below:   BLADDER SCAN AMB NON-IMAGING  Result Value Ref Range   Scan Result 40 mL    Component     Latest Ref Rng & Units 10/24/2020        12:08 PM  WBC     4.0 - 10.5 K/uL 13.2 (H)  RBC     4.22 - 5.81 MIL/uL 4.42  Hemoglobin     13.0 -  17.0 g/dL 13.0  HCT     39.0 - 52.0 % 42.3  MCV     80.0 - 100.0 fL 95.7  MCH     26.0 - 34.0 pg 29.4  MCHC     30.0 - 36.0 g/dL 30.7  RDW     11.5 - 15.5 %  14.3  Platelets     150 - 400 K/uL 162  nRBC     0.0 - 0.2 % 0.0  Neutrophils     % 77  NEUT#     1.7 - 7.7 K/uL 10.3 (H)  Lymphocytes     % 10  Lymphocyte #     0.7 - 4.0 K/uL 1.3  Monocytes Relative     % 10  Monocyte #     0.1 - 1.0 K/uL 1.3 (H)  Eosinophil     % 2  Eosinophils Absolute     0.0 - 0.5 K/uL 0.3  Basophil     % 0  Basophils Absolute     0.0 - 0.1 K/uL 0.0  Immature Granulocytes     % 1  Abs Immature Granulocytes     0.00 - 0.07 K/uL 0.10 (H)  WBC, UA     0 - 5 /hpf   Epithelial Cells (non renal)     0 - 10 /hpf   Casts     None seen /lpf   Cast Type     N/A   Crystals     N/A   Crystal Type     N/A   Mucus, UA     Not Estab.   Bacteria, UA     None seen/Few   Renal Epithel, UA     None seen /hpf    Component     Latest Ref Rng & Units 10/24/2020  Lactic Acid, Venous     0.5 - 1.9 mmol/L 1.3   Component     Latest Ref Rng & Units 10/24/2020  Sodium     135 - 145 mmol/L 139  Potassium     3.5 - 5.1 mmol/L 4.0  Chloride     98 - 111 mmol/L 99  CO2     22 - 32 mmol/L 28  Glucose     70 - 99 mg/dL 104 (H)  BUN     8 - 23 mg/dL 30 (H)  Creatinine     0.61 - 1.24 mg/dL 1.38 (H)  Calcium     8.9 - 10.3 mg/dL 8.5 (L)  GFR, Estimated     >60 mL/min 50 (L)  Anion gap     5 - 15 12  I have reviewed the labs.   Pertinent Imaging: Results for SHERMAN, PERAULT (MRN [id number]) as of 10/26/2020 22:14  Ref. Range 10/26/2020 22:14  Scan Result Unknown 40 mL    Assessment & Plan:    1. Dehydration Patient presented with low BP and very concentrated urine.  He drinks 40 ounces of water with [place] today and his blood pressure improved and we were able to obtain enough urine to send for urine culture and urinalysis.  He denies any shortness of breath, lightheadedness or chest pain.  I had a conversation with him and his daughter regarding whether to continue evaluation through the emergency department where he could get IV fluids, stat labs  and any other tests deemed necessary to see if he should be admitted or stable enough to continue management at home.  The patient stated he  felt fine and wished to return home.  His daughter will continue to encourage him to drink water/Gatorade and to monitor urine output.  I explained that he needs to drink enough fluid so that his urine color would be a lighter yellow.  I will also obtain a stat CBC, BMP and lactic acid today.  I will contact the daughter with those results.  I obtained his first urinalysis directly from the leg bag and it was positive for greater than 30 RBCs and only 6-10 WBCs with a few bacteria.  I have sent the urine for culture, but I have not yet prescribed an antibiotic as I do not know what his renal function is currently.  Once his blood work has returned, he will either be directed to the emergency department, prescribe an oral antibiotic while we await for culture or continue to hold antibiotic until culture results are available.  I reviewed red flag signs with the daughter such as fever/chills, continued decreased urine output, continued low blood pressure, dizziness/lightheadedness, chest pain, shortness of breath or loss of consciousness and to seek treatment in the ED immediately if he should present with any of the symptoms. Keep follow up on Monday   2. Urinary retention Catheter to stay in place for two weeks (placed 10/22/2020) due to urethral strictures  3. Gross hematuria - will schedule cysto now that strictures have been addressed  - UA is sent for culture - will have patient take Macrobid 100 mg daily until culture results are available as a precaution as he has had several manipulations of his urethra in the last few days   Return for keep monday's appointment .  These notes generated with voice recognition software. I apologize for typographical errors.  Zara Council, PA-C  [place] [address]  [place],  [address] [phone]  I spent 40 minutes on the day of the encounter to include pre-visit record review, face-to-face time with the patient, post-visit ordering of tests and contacting daughter with results.

## 2020-10-26 LAB — BLADDER SCAN AMB NON-IMAGING: Scan Result: 40

## 2020-10-26 NOTE — Progress Notes (Signed)
10/27/2020 7:37 PM   [place] 1933/11/12 [id number]  Referring provider: [name], MD Chris Simon,  Chris Simon [id number]  Chief Complaint  Patient presents with  . Follow-up    KUB   Urological history 1. High risk hematuria - former smoker - CTU 02/2020 small nonobstructive calculus of the midportion of the left kidney. Two small adjacent calculi in the dependent left urinary bladder adjacent to the ureteropelvic junction measuring 2-3 mm. No hydronephrosis.  No evidence of mass or urinary tract filling defect.  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  The second stricture was more dense in appearance, unable to advance scope beyond this level - UA > 30 RBC's likely secondary to urethral dilations during recent hospitalization and catheter  - culture pending  2. Bulbar strictures - admitted on 10/17/2020 for COPD exacerbation - bladder scan 600 mL, nursing staff unable to place catheter - bedside dilation was performed to 18Fr and 14Fr Simon catheter was placed - seen in [facility] ED in clot retention and 14Fr catheter was removed and an 18Fr coude cathter was able to be placed - catheter to stay in place for two weeks  3. Elevated PSA - 8.1 in 2016 - screening has been discontinued  4. BPH with LU TS - severe prostatomegaly on CTU 02/2020 - managed with finasteride 5 mg daily and tamsulosin 0.4 mg daily   HPI: Chris Simon is a 87 y.o. male who presents today with his daughter, Chris Simon, for follow up.   Patient is agitated and is wanting the catheter out today.  His daughter states that he is has been unable to sleep which is worsening his dementia due to bladder spasms with a catheter.  We did discuss starting a medication for bladder spasms as I would like the catheter to stay in  a little longer due to his recent urethral dilation for strictures, but she stated that he has been more agitated at home due to bladder spasms and would rather just have the catheter out.  BUN, serum creatinine and WBC counts have all improved since Friday.  His blood pressure is also stable at low normal.    Catheter in place, draining orange clear urine.  PMH: Past Medical History:  Diagnosis Date  . Aneurysm (Chris Simon)    Abdominal Aortic Aneurysm  . Atrial fibrillation (Chris Simon)   . Blockage of a bile duct   . BPH with obstruction/lower urinary tract symptoms   . Cellulitis of scrotum   . COPD (chronic obstructive pulmonary disease) (Chris Simon)   . Dementia (Chris Simon)   . Diverticulosis   . Dyspnea    with exertion  . Dysrhythmia   . Elevated PSA   . Epididymitis   . Gallstone   . Gross hematuria   . History of kidney stones   . Hyperlipidemia   . Kidney stones   . Nocturia   . Testicular pain   . TIA (transient ischemic attack)    June 2018  . Urinary retention     Surgical History: Past Surgical History:  Procedure Laterality Date  . CHOLECYSTECTOMY N/A 07/11/2015   Procedure: LAPAROSCOPIC CHOLECYSTECTOMY;  Surgeon: Chris Bookbinder, MD;  Location: [facility];  Service: General;  Laterality: N/A;  . ERCP N/A 07/09/2015   Procedure: ENDOSCOPIC RETROGRADE CHOLANGIOPANCREATOGRAPHY (ERCP);  Surgeon: Chris Essex, MD;  Location: [facility];  Service: Endoscopy;  Laterality: N/A;  . INGUINAL HERNIA REPAIR Left 04/07/2017   Procedure: HERNIA REPAIR INGUINAL ADULT;  Surgeon: Chris Pert, MD;  Location: [facility];  Service: General;  Laterality: Left;  . INSERTION OF MESH Left 04/07/2017   Procedure: INSERTION OF MESH;  Surgeon: Chris Pert, MD;  Location: [facility];  Service: General;  Laterality: Left;  . KYPHOPLASTY N/A 11/16/2018   Procedure: KYPHOPLASTY T12;  Surgeon: Chris Knows, MD;  Location: [facility];  Service: Orthopedics;  Laterality: N/A;  . LEFT HEART CATH AND CORONARY  ANGIOGRAPHY N/A 01/10/2018   Procedure: LEFT HEART CATH AND CORONARY ANGIOGRAPHY;  Surgeon: Chris David, MD;  Location: [facility];  Service: Cardiovascular;  Laterality: N/A;    Home Medications:  Allergies as of 10/27/2020      Reactions   Seroquel [quetiapine Fumarate]    confusion   Clindamycin Hcl Rash   Unknown reaction  Reaction: Unknown  Unknown reaction    Sulfamethoxazole-trimethoprim Rash      Medication List       Accurate as of October 27, 2020 11:59 PM. If you have any questions, ask your nurse or doctor.        acetaminophen 325 MG tablet Commonly known as: TYLENOL Take 650 mg by mouth every 6 (six) hours as needed.   albuterol 108 (90 Base) MCG/ACT inhaler Commonly known as: VENTOLIN HFA Inhale 1-2 puffs into the lungs every 4 (four) hours as needed for wheezing or shortness of breath.   amiodarone 200 MG tablet Commonly known as: PACERONE Take 200 mg by mouth at bedtime.   B-12 2500 MCG Tabs Take 2,500 mg by mouth daily.   benzonatate 200 MG capsule Commonly known as: TESSALON Take 1 capsule (200 mg total) by mouth 3 (three) times daily as needed for cough.   carvedilol 3.125 MG tablet Commonly known as: COREG Take 1 tablet (3.125 mg total) by mouth 2 (two) times daily.   donepezil 10 MG tablet Commonly known as: ARICEPT Take 10 mg by mouth at bedtime.   Eliquis 5 MG Tabs tablet Generic drug: apixaban Take 5 mg by mouth 2 (two) times daily.   enalapril 2.5 MG tablet Commonly known as: VASOTEC Take 2.5 mg by mouth at bedtime.   finasteride 5 MG tablet Commonly known as: PROSCAR TAKE 1 TABLET(5 MG) BY MOUTH DAILY   furosemide 20 MG tablet Commonly known as: LASIX Take 20 mg by mouth daily as needed. For feet swelling.   Gemtesa 75 MG Tabs Generic drug: Vibegron Take 75 mg by mouth daily. Started by: Chris Council, PA-C   isosorbide mononitrate 30 MG 24 hr tablet Commonly known as: IMDUR Take 30 mg by mouth at  bedtime.   loratadine 10 MG tablet Commonly known as: CLARITIN Take 10 mg by mouth daily as needed for allergies.   Melatonin 10 MG Tabs Take by mouth.   memantine 10 MG tablet Commonly known as: NAMENDA Take 10 mg by mouth 2 (two) times daily.   mirtazapine 15 MG tablet Commonly known as: REMERON Take 15 mg by mouth at bedtime.   multivitamin with minerals Tabs tablet Take 1 tablet by mouth daily.   nitrofurantoin (macrocrystal-monohydrate) 100 MG capsule Commonly known as: MACROBID Take 1 capsule (100 mg total) by mouth at bedtime.   rosuvastatin 40 MG tablet Commonly known as: CRESTOR Take 40 mg by mouth at bedtime.   tamsulosin 0.4 MG Caps capsule Commonly known as: FLOMAX TAKE 1  CAPSULE BY MOUTH EVERY DAY       Allergies:  Allergies  Allergen Reactions  . Seroquel [Quetiapine Fumarate]     confusion  . Clindamycin Hcl Rash    Unknown reaction  Reaction: Unknown  Unknown reaction   . Sulfamethoxazole-Trimethoprim Rash    Family History: Family History  Problem Relation Age of Onset  . Prostate cancer Brother   . Kidney disease Neg Hx   . Bladder Cancer Neg Hx     Social History:  reports that he has quit smoking. His smoking use included cigarettes. He smoked 0.50 packs per day. He has never used smokeless tobacco. He reports that he does not drink alcohol and does not use drugs.  ROS: Pertinent ROS in HPI  Physical Exam: BP 106/63   Pulse 70   Ht 6' (1.829 m)   Wt 181 lb (82.1 kg)   BMI 24.55 kg/m   Constitutional:  Well nourished. Alert and oriented, No acute distress. HEENT: [place] AT, mask in place.  Trachea midline Cardiovascular: No clubbing, cyanosis, or edema. Respiratory: Normal respiratory effort, no increased work of breathing. Neurologic: Grossly intact, no focal deficits, moving all 4 extremities. Psychiatric: Normal mood and affect. .  Laboratory Data: Component     Latest Ref Rng & Units 10/27/2020  Sodium     135 - 145  mmol/L 139  Potassium     3.5 - 5.1 mmol/L 4.1  Chloride     98 - 111 mmol/L 103  CO2     22 - 32 mmol/L 27  Glucose     70 - 99 mg/dL 97  BUN     8 - 23 mg/dL 20  Creatinine     0.61 - 1.24 mg/dL 1.31 (H)  Calcium     8.9 - 10.3 mg/dL 7.9 (L)  GFR, Estimated     >60 mL/min 54 (L)  Anion gap     5 - 15 9   Component     Latest Ref Rng & Units 10/27/2020          WBC     4.0 - 10.5 K/uL 9.3  RBC     4.22 - 5.81 MIL/uL 4.28  Hemoglobin     13.0 - 17.0 g/dL 12.4 (L)  HCT     39.0 - 52.0 % 41.0  MCV     80.0 - 100.0 fL 95.8  MCH     26.0 - 34.0 pg 29.0  MCHC     30.0 - 36.0 g/dL 30.2  RDW     11.5 - 15.5 % 14.2  Platelets     150 - 400 K/uL 191  nRBC     0.0 - 0.2 % 0.0  Neutrophils     % 70  NEUT#     1.7 - 7.7 K/uL 6.6  Lymphocytes     % 14  Lymphocyte #     0.7 - 4.0 K/uL 1.3  Monocytes Relative     % 10  Monocyte #     0.1 - 1.0 K/uL 0.9  Eosinophil     % 5  Eosinophils Absolute     0.0 - 0.5 K/uL 0.4  Basophil     % 0  Basophils Absolute     0.0 - 0.1 K/uL 0.0  Immature Granulocytes     % 1  Abs Immature Granulocytes     0.00 - 0.07 K/uL 0.07  WBC, UA     0 - 5 /hpf  Epithelial Cells (non renal)     0 - 10 /hpf   Casts     None seen /lpf   Cast Type     N/A   Crystals     N/A   Crystal Type     N/A   Mucus, UA     Not Estab.   Bacteria, UA     None seen/Few   Renal Epithel, UA     None seen /hpf   I have reviewed the labs.   Pertinent Imaging: CLINICAL DATA:  Kidney stone.  EXAM: ABDOMEN - 1 VIEW  COMPARISON:  September 16, 2020.  FINDINGS: The bowel gas pattern is normal. Status post cholecystectomy. Stable small nonobstructive left renal calculus.  IMPRESSION: Stable small nonobstructive left renal calculus.   Electronically Signed   By: Marijo Conception M.D.   On: 10/27/2020 14:33 I have independently reviewed the films.  See HPI.   Catheter Removal Patient is present today for a catheter removal.   15 ml of water was drained from the balloon. An 44 FR coude foley cath was removed from the bladder no complications were noted . Patient tolerated well.  Performed by: Myself  Assessment & Plan:    1. Dehydration Patient's blood pressure labs have now returned to normal.  Encouraged his daughter to continue monitor his fluid intake to ensure he does not become dehydrated again.  2. Urinary retention Catheter removed due to patient's agitation and inability to sleep through the night thus worsening dementia/agitation  3. Gross hematuria - will schedule cysto now that strictures have been addressed hopefully the scope can be passed into the bladder to complete his hematuria work up and further assessment of his rUTI's   Return for cysto for gross heme .  These notes generated with voice recognition software. I apologize for typographical errors.  Chris Council, PA-C  [facility] [address]  [place], [place] [address] [phone]

## 2020-10-26 NOTE — Progress Notes (Incomplete)
10/27/2020 11:07 PM   [place] 1933/10/09 [id number]  Referring provider: Sofie Simon, Chris Simon,  [place] [address]  No chief complaint on file.  Urological history 1. High risk hematuria - former smoker - CTU 02/2020 small nonobstructive calculus of the midportion of the left kidney. Two small adjacent calculi in the dependent left urinary bladder adjacent to the ureteropelvic junction measuring 2-3 mm. No hydronephrosis.  No evidence of mass or urinary tract filling defect.  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  The second stricture was more dense in appearance, unable to advance scope beyond this level - UA > 30 RBC's likely secondary to urethral dilations during recent hospitalization and catheter  - culture pending  2. Bulbar strictures - admitted on 10/17/2020 for COPD exacerbation - bladder scan 600 mL, nursing staff unable to place catheter - bedside dilation was performed to 18Fr and 14Fr council catheter was placed - seen in [facility] ED in clot retention and 14Fr catheter was removed and an 18Fr coude cathter was able to be placed - catheter to stay in place for two weeks  3. Elevated PSA - 8.1 in 2016 - screening has been discontinued  4. BPH with LU TS - severe prostatomegaly on CTU 02/2020 - managed with finasteride 5 mg daily and tamsulosin 0.4 mg daily   HPI: Chris Simon is a 87 y.o. male who presents today with his daughter, Chris Simon, for follow up.     PMH: Past Medical History:  Diagnosis Date  . Aneurysm ([place])    Abdominal Aortic Aneurysm  . Atrial fibrillation ([place])   . Blockage of a bile duct   . BPH with obstruction/lower urinary tract symptoms   . Cellulitis of scrotum   . COPD (chronic obstructive pulmonary disease) ([place])   . Dementia ([place])   .  Diverticulosis   . Dyspnea    with exertion  . Dysrhythmia   . Elevated PSA   . Epididymitis   . Gallstone   . Gross hematuria   . History of kidney stones   . Hyperlipidemia   . Kidney stones   . Nocturia   . Testicular pain   . TIA (transient ischemic attack)    June 2018  . Urinary retention     Surgical History: Past Surgical History:  Procedure Laterality Date  . CHOLECYSTECTOMY N/A 07/11/2015   Procedure: LAPAROSCOPIC CHOLECYSTECTOMY;  Surgeon: Rolm Bookbinder, MD;  Location: [facility];  Service: General;  Laterality: N/A;  . ERCP N/A 07/09/2015   Procedure: ENDOSCOPIC RETROGRADE CHOLANGIOPANCREATOGRAPHY (ERCP);  Surgeon: Clarene Essex, MD;  Location: [facility];  Service: Endoscopy;  Laterality: N/A;  . INGUINAL HERNIA REPAIR Left 04/07/2017   Procedure: HERNIA REPAIR INGUINAL ADULT;  Surgeon: Clayburn Pert, MD;  Location: [facility];  Service: General;  Laterality: Left;  . INSERTION OF MESH Left 04/07/2017   Procedure: INSERTION OF MESH;  Surgeon: Clayburn Pert, MD;  Location: [facility];  Service: General;  Laterality: Left;  . KYPHOPLASTY N/A 11/16/2018   Procedure: KYPHOPLASTY T12;  Surgeon: Hessie Knows, MD;  Location: [facility];  Service: Orthopedics;  Laterality: N/A;  . LEFT HEART CATH AND CORONARY ANGIOGRAPHY N/A 01/10/2018   Procedure: LEFT HEART CATH AND CORONARY ANGIOGRAPHY;  Surgeon: Dionisio David, MD;  Location: [place] [facility];  Service: Cardiovascular;  Laterality:  N/A;    Home Medications:  Allergies as of 10/27/2020      Reactions   Seroquel [quetiapine Fumarate]    confusion   Clindamycin Hcl Rash   Unknown reaction  Reaction: Unknown  Unknown reaction    Sulfamethoxazole-trimethoprim Rash      Medication List       Accurate as of October 26, 2020 11:07 PM. If you have any questions, ask your nurse or doctor.        acetaminophen 325 MG tablet Commonly known as: TYLENOL Take 650 mg by mouth every 6 (six) hours as needed.    albuterol 108 (90 Base) MCG/ACT inhaler Commonly known as: VENTOLIN HFA Inhale 1-2 puffs into the lungs every 4 (four) hours as needed for wheezing or shortness of breath.   amiodarone 200 MG tablet Commonly known as: PACERONE Take 200 mg by mouth at bedtime.   B-12 2500 MCG Tabs Take 2,500 mg by mouth daily.   benzonatate 200 MG capsule Commonly known as: TESSALON Take 1 capsule (200 mg total) by mouth 3 (three) times daily as needed for cough.   carvedilol 3.125 MG tablet Commonly known as: COREG Take 1 tablet (3.125 mg total) by mouth 2 (two) times daily.   donepezil 10 MG tablet Commonly known as: ARICEPT Take 10 mg by mouth at bedtime.   Eliquis 5 MG Tabs tablet Generic drug: apixaban Take 5 mg by mouth 2 (two) times daily.   enalapril 2.5 MG tablet Commonly known as: VASOTEC Take 2.5 mg by mouth at bedtime.   finasteride 5 MG tablet Commonly known as: PROSCAR TAKE 1 TABLET(5 MG) BY MOUTH DAILY   furosemide 20 MG tablet Commonly known as: LASIX Take 20 mg by mouth daily as needed. For feet swelling.   isosorbide mononitrate 30 MG 24 hr tablet Commonly known as: IMDUR Take 30 mg by mouth at bedtime.   loratadine 10 MG tablet Commonly known as: CLARITIN Take 10 mg by mouth daily as needed for allergies.   Melatonin 10 MG Tabs Take by mouth.   memantine 10 MG tablet Commonly known as: NAMENDA Take 10 mg by mouth 2 (two) times daily.   mirtazapine 15 MG tablet Commonly known as: REMERON Take 15 mg by mouth at bedtime.   multivitamin with minerals Tabs tablet Take 1 tablet by mouth daily.   nitrofurantoin (macrocrystal-monohydrate) 100 MG capsule Commonly known as: MACROBID Take 1 capsule (100 mg total) by mouth at bedtime.   rosuvastatin 40 MG tablet Commonly known as: CRESTOR Take 40 mg by mouth at bedtime.   tamsulosin 0.4 MG Caps capsule Commonly known as: FLOMAX TAKE 1 CAPSULE BY MOUTH EVERY DAY       Allergies:  Allergies  Allergen  Reactions  . Seroquel [Quetiapine Fumarate]     confusion  . Clindamycin Hcl Rash    Unknown reaction  Reaction: Unknown  Unknown reaction   . Sulfamethoxazole-Trimethoprim Rash    Family History: Family History  Problem Relation Age of Onset  . Prostate cancer Brother   . Kidney disease Neg Hx   . Bladder Cancer Neg Hx     Social History:  reports that he has quit smoking. His smoking use included cigarettes. He smoked 0.50 packs per day. He has never used smokeless tobacco. He reports that he does not drink alcohol and does not use drugs.  ROS: Pertinent ROS in HPI  Physical Exam: There were no vitals taken for this visit.  Constitutional:  Well nourished. Alert and  oriented, No acute distress. HEENT: [place] AT, moist mucus membranes.  Trachea midline Cardiovascular: No clubbing, cyanosis, or edema. Respiratory: Normal respiratory effort, no increased work of breathing. GI: Abdomen is soft, non tender, non distended, no abdominal masses. Liver and spleen not palpable.  No hernias appreciated.  Stool sample for occult testing is not indicated.   GU: No CVA tenderness.  No bladder fullness or masses.  Patient with circumcised/uncircumcised phallus. ***Foreskin easily retracted***  Urethral meatus is patent.  No penile discharge. No penile lesions or rashes. Scrotum without lesions, cysts, rashes and/or edema.  Testicles are located scrotally bilaterally. No masses are appreciated in the testicles. Left and right epididymis are normal. Rectal: Patient with  normal sphincter tone. Anus and perineum without scarring or rashes. No rectal masses are appreciated. Prostate is approximately *** grams, *** nodules are appreciated. Seminal vesicles are normal. Skin: No rashes, bruises or suspicious lesions. Lymph: No inguinal adenopathy. Neurologic: Grossly intact, no focal deficits, moving all 4 extremities. Psychiatric: Normal mood and affect. .  Laboratory Data: ***  have reviewed the  labs.   Pertinent Imaging: ***   Assessment & Plan:    1. Dehydration Patient presented with low BP and very concentrated urine.  He drinks 40 ounces of water with [place] today and his blood pressure improved and we were able to obtain enough urine to send for urine culture and urinalysis.  He denies any shortness of breath, lightheadedness or chest pain.  I had a conversation with him and his daughter regarding whether to continue evaluation through the emergency department where he could get IV fluids, stat labs and any other tests deemed necessary to see if he should be admitted or stable enough to continue management at home.  The patient stated he felt fine and wished to return home.  His daughter will continue to encourage him to drink water/Gatorade and to monitor urine output.  I explained that he needs to drink enough fluid so that his urine color would be a lighter yellow.  I will also obtain a stat CBC, BMP and lactic acid today.  I will contact the daughter with those results.  I obtained his first urinalysis directly from the leg bag and it was positive for greater than 30 RBCs and only 6-10 WBCs with a few bacteria.  I have sent the urine for culture, but I have not yet prescribed an antibiotic as I do not know what his renal function is currently.  Once his blood work has returned, he will either be directed to the emergency department, prescribe an oral antibiotic while we await for culture or continue to hold antibiotic until culture results are available.  I reviewed red flag signs with the daughter such as fever/chills, continued decreased urine output, continued low blood pressure, dizziness/lightheadedness, chest pain, shortness of breath or loss of consciousness and to seek treatment in the ED immediately if he should present with any of the symptoms. Keep follow up on Monday   2. Urinary retention Catheter to stay in place for two weeks (placed 10/22/2020) due to urethral  strictures  3. Gross hematuria - will schedule cysto now that strictures have been addressed  - UA is sent for culture - will have patient take Macrobid 100 mg daily until culture results are available as a precaution as he has had several manipulations of his urethra in the last few days   No follow-ups on file.  These notes generated with voice recognition software. I apologize for  typographical errors.  Zara Council, PA-C  [facility] [address]  [place], [place] [address] [phone]

## 2020-10-27 ENCOUNTER — Other Ambulatory Visit: Payer: Self-pay | Admitting: *Deleted

## 2020-10-27 ENCOUNTER — Ambulatory Visit
Admission: RE | Admit: 2020-10-27 | Discharge: 2020-10-27 | Disposition: A | Discharge status: Home / Self Care | Payer: Medicare HMO | Source: Ambulatory Visit | Attending: Urology | Admitting: Urology

## 2020-10-27 ENCOUNTER — Other Ambulatory Visit: Payer: Self-pay

## 2020-10-27 ENCOUNTER — Encounter: Payer: Self-pay | Admitting: Urology

## 2020-10-27 ENCOUNTER — Ambulatory Visit: Payer: Medicare HMO | Admitting: Urology

## 2020-10-27 ENCOUNTER — Other Ambulatory Visit: Payer: Medicare HMO | Admitting: Primary Care

## 2020-10-27 ENCOUNTER — Other Ambulatory Visit
Admission: RE | Admit: 2020-10-27 | Discharge: 2020-10-27 | Disposition: A | Discharge status: Home / Self Care | Payer: Medicare HMO | Source: Home / Self Care | Attending: Urology | Admitting: Urology

## 2020-10-27 ENCOUNTER — Ambulatory Visit
Admission: RE | Admit: 2020-10-27 | Discharge: 2020-10-27 | Disposition: A | Discharge status: Home / Self Care | Payer: Medicare HMO | Attending: Urology | Admitting: Urology

## 2020-10-27 VITALS — BP 106/63 | HR 70 | Ht 72.0 in | Wt 181.0 lb

## 2020-10-27 DIAGNOSIS — J449 Chronic obstructive pulmonary disease, unspecified: Secondary | ICD-10-CM

## 2020-10-27 DIAGNOSIS — I480 Paroxysmal atrial fibrillation: Secondary | ICD-10-CM

## 2020-10-27 DIAGNOSIS — R310 Gross hematuria: Secondary | ICD-10-CM | POA: Diagnosis not present

## 2020-10-27 DIAGNOSIS — N210 Calculus in bladder: Secondary | ICD-10-CM | POA: Insufficient documentation

## 2020-10-27 DIAGNOSIS — I4811 Longstanding persistent atrial fibrillation: Secondary | ICD-10-CM

## 2020-10-27 DIAGNOSIS — F0280 Dementia in other diseases classified elsewhere without behavioral disturbance: Secondary | ICD-10-CM

## 2020-10-27 DIAGNOSIS — N401 Enlarged prostate with lower urinary tract symptoms: Secondary | ICD-10-CM

## 2020-10-27 DIAGNOSIS — E860 Dehydration: Secondary | ICD-10-CM | POA: Diagnosis not present

## 2020-10-27 DIAGNOSIS — N35112 Postinfective bulbous urethral stricture, not elsewhere classified: Secondary | ICD-10-CM | POA: Diagnosis not present

## 2020-10-27 DIAGNOSIS — Z515 Encounter for palliative care: Secondary | ICD-10-CM

## 2020-10-27 DIAGNOSIS — R339 Retention of urine, unspecified: Secondary | ICD-10-CM

## 2020-10-27 LAB — CBC WITH DIFFERENTIAL/PLATELET
Abs Immature Granulocytes: 0.07 10*3/uL (ref 0.00–0.07)
Basophils Absolute: 0 10*3/uL (ref 0.0–0.1)
Basophils Relative: 0 %
Eosinophils Absolute: 0.4 10*3/uL (ref 0.0–0.5)
Eosinophils Relative: 5 %
HCT: 41 % (ref 39.0–52.0)
Hemoglobin: 12.4 g/dL — ABNORMAL LOW (ref 13.0–17.0)
Immature Granulocytes: 1 %
Lymphocytes Relative: 14 %
Lymphs Abs: 1.3 10*3/uL (ref 0.7–4.0)
MCH: 29 pg (ref 26.0–34.0)
MCHC: 30.2 g/dL (ref 30.0–36.0)
MCV: 95.8 fL (ref 80.0–100.0)
Monocytes Absolute: 0.9 10*3/uL (ref 0.1–1.0)
Monocytes Relative: 10 %
Neutro Abs: 6.6 10*3/uL (ref 1.7–7.7)
Neutrophils Relative %: 70 %
Platelets: 191 10*3/uL (ref 150–400)
RBC: 4.28 MIL/uL (ref 4.22–5.81)
RDW: 14.2 % (ref 11.5–15.5)
WBC: 9.3 10*3/uL (ref 4.0–10.5)
nRBC: 0 % (ref 0.0–0.2)

## 2020-10-27 LAB — BASIC METABOLIC PANEL
Anion gap: 9 (ref 5–15)
BUN: 20 mg/dL (ref 8–23)
CO2: 27 mmol/L (ref 22–32)
Calcium: 7.9 mg/dL — ABNORMAL LOW (ref 8.9–10.3)
Chloride: 103 mmol/L (ref 98–111)
Creatinine, Ser: 1.31 mg/dL — ABNORMAL HIGH (ref 0.61–1.24)
GFR, Estimated: 54 mL/min — ABNORMAL LOW (ref >60)
Glucose, Bld: 97 mg/dL (ref 70–99)
Potassium: 4.1 mmol/L (ref 3.5–5.1)
Sodium: 139 mmol/L (ref 135–145)

## 2020-10-27 MED ORDER — GEMTESA 75 MG PO TABS: 75.0000 mg | ORAL_TABLET | Freq: Every day | ORAL | 0 refills | Status: AC

## 2020-10-27 NOTE — Progress Notes (Signed)
[facility] Consult Note Telephone: [phone]  Fax: [phone]     Date of encounter: 10/27/20 PATIENT NAME: Chris Simon [address] [place] [address] [phone] (home)  DOB: December 06, 1933 MRN: [id number]  PRIMARY CARE PROVIDER:    [name], MD,  [place] [address] [phone]  REFERRING PROVIDER:   [name], [place],  [place] [address] [phone]  RESPONSIBLE PARTY:   Extended Emergency Contact Information Primary Emergency Contact: Meagan Spease,[name]: [place], [place] [address] Johnnette Litter of [place] Phone: [phone] Relation: Daughter Secondary Emergency Contact: [place] Mobile Phone: [phone] Relation: Daughter  I met face to face with patient and family in  home. Palliative Care was asked to follow this patient by consultation request of Feldpausch, Chrissie Noa, MD to help address advance care planning and goals of care. This is a follow up  visit.   ASSESSMENT AND RECOMMENDATIONS:   1. Advance Care Planning/Goals of Care: Goals include to maximize quality of life and symptom management. Our advance care planning conversation included a discussion about:     Experiences with loved ones who have been seriously ill or have died   Exploration of personal, cultural or spiritual beliefs that might influence medical decisions   2. Symptom Management:   Hematuria: Recent d/c of cath. Education to watch for retention, encourage fluids  Covid 19 education: Patient is not immunized. Education provided about importance of his getting immunized. Daughter states he has refused in the past and thinks the vaccine will kill him. Attempts to encourage him to reconsider were not resisted, but not accepted.  Dementia Caregiver Strain: Daughter is only caregiver. Lost her husband last year to Covid. States she has all  responsibilities. Discussed [facility] day care program. Call to program altho he would need to be immunized to participate. Message left for return call.   Decline: Has had several hospital stays since last PC visit.  Need to follow monthly to assess for acute processes.   COPD: New on budesonide. Had mouth pipe nebulizer but daughter asked for mask which works better. Lungs moving air fairly today. Some dry cough. Endorses long smoking history. Oxygen machine is not working, alarms and cuts off, will f/u with informing equipment company to come to repair.  3. Follow up Palliative Care Visit: Palliative care will continue to follow for goals of care clarification and symptom management. Return 4 weeks or prn.  4. Family /Caregiver/Community Supports:  [place] Declines MOW.States not eligible for [employer].  5. Cognitive / Functional decline: A and o x 1-2, conversant. Able to do some adls.  I spent 60 minutes providing this consultation,  from 1600 to 1700. More than 50% of the time in this consultation was spent in counseling and care coordination.  CODE STATUS: DNR  PPS: 40%  HOSPICE ELIGIBILITY/DIAGNOSIS: TBD  Subjective:  CHIEF COMPLAINT: dyspnea  HISTORY OF PRESENT ILLNESS:  Chris Simon is a 87 y.o. year old male  with dyspnea, copd, h/o hematuria, dementia. Presents with dementia and some resistance to care. Caregiver strain noted. Discussed options for respite for caregiver. Also has COPD which needs management in the community due to frequent exacerbations.   We are asked to consult around advance care planning and complex medical decision making.    Review and summarization of old Epic  records shows or history from other than patient  Review or lab tests, radiology,  or medicine =Recent BMP, CBC and urinalysis Review of case with family member [name]   History obtained from review of EMR, discussion with primary team, and  interview  with family, caregiver  and/or Mr. Dorko. Records reviewed and summarized above.   CURRENT PROBLEM LIST:  Patient Active Problem List   Diagnosis Date Noted  . Dehydration   . Hypotension   . Pneumonia due to COVID-19 virus 11/01/2019  . COPD (chronic obstructive pulmonary disease) ([place])   . Acute respiratory failure with hypoxia ([place]) 10/22/2019  . Chronic indwelling Foley catheter   . Alzheimer's dementia without behavioral disturbance ([place])   . Pneumonia 02/20/2017  . TIA (transient ischemic attack) 02/18/2017  . Left inguinal hernia 06/22/2016  . Umbilical hernia without obstruction and without gangrene 06/22/2016  . Calculus of kidney 09/03/2015  . Testicular cyst 09/03/2015  . Sepsis secondary to UTI ([place]) 07/20/2015  . BPH (benign prostatic hyperplasia) 07/18/2015  . Elevated PSA 07/18/2015  . Sepsis ([place]) 07/09/2015  . Acute kidney injury ([place])   . Paroxysmal A-fib ([place])   . Lower urinary tract infectious disease   . Atrial fibrillation ([place]) 10/01/2014  . Pure hypercholesterolemia 10/01/2014  . Benign fibroma of prostate 10/06/2012  . Bladder retention 10/06/2012   PAST MEDICAL HISTORY:  Active Ambulatory Problems    Diagnosis Date Noted  . Sepsis ([place]) 07/09/2015  . Acute kidney injury ([place])   . Paroxysmal A-fib ([place])   . Lower urinary tract infectious disease   . BPH (benign prostatic hyperplasia) 07/18/2015  . Elevated PSA 07/18/2015  . Sepsis secondary to UTI ([place]) 07/20/2015  . Atrial fibrillation ([place]) 10/01/2014  . Benign fibroma of prostate 10/06/2012  . Calculus of kidney 09/03/2015  . Pure hypercholesterolemia 10/01/2014  . Testicular cyst 09/03/2015  . Bladder retention 10/06/2012  . Left inguinal hernia 06/22/2016  . Umbilical hernia without obstruction and without gangrene 06/22/2016  . TIA (transient ischemic attack) 02/18/2017  . Pneumonia 02/20/2017  . Acute respiratory failure with hypoxia ([place]) 10/22/2019  . Chronic indwelling Foley  catheter   . Alzheimer's dementia without behavioral disturbance ([place])   . Pneumonia due to COVID-19 virus 11/01/2019  . COPD (chronic obstructive pulmonary disease) ([place])   . Dehydration   . Hypotension    Resolved Ambulatory Problems    Diagnosis Date Noted  . Choledocholithiasis s/p ERCP 07/09/2015   . Acute cholecystitis with chronic cholecystitis s/p lap chole 07/11/2015 07/13/2015  . Acute cholecystitis 07/14/2015   Past Medical History:  Diagnosis Date  . Aneurysm ([place])   . Blockage of a bile duct   . BPH with obstruction/lower urinary tract symptoms   . Cellulitis of scrotum   . Dementia ([place])   . Diverticulosis   . Dyspnea   . Dysrhythmia   . Epididymitis   . Gallstone   . Gross hematuria   . History of kidney stones   . Hyperlipidemia   . Kidney stones   . Nocturia   . Testicular pain   . Urinary retention    SOCIAL HX:  Social History   Tobacco Use  . Smoking status: Former Smoker    Packs/day: 0.50    Types: Cigarettes  . Smokeless tobacco: Never Used  . Tobacco comment: quit 40 years  Substance Use Topics  . Alcohol use: No    Alcohol/week: 0.0 standard drinks   FAMILY HX:  Family History  Problem Relation Age  of Onset  . Prostate cancer Brother   . Kidney disease Neg Hx   . Bladder Cancer Neg Hx       ALLERGIES:  Allergies  Allergen Reactions  . Seroquel [Quetiapine Fumarate]     confusion  . Clindamycin Hcl Rash    Unknown reaction  Reaction: Unknown  Unknown reaction   . Sulfamethoxazole-Trimethoprim Rash     PERTINENT MEDICATIONS:  Outpatient Encounter Medications as of 10/27/2020  Medication Sig  . acetaminophen (TYLENOL) 325 MG tablet Take 650 mg by mouth every 6 (six) hours as needed.  Marland Kitchen albuterol (VENTOLIN HFA) 108 (90 Base) MCG/ACT inhaler Inhale 1-2 puffs into the lungs every 4 (four) hours as needed for wheezing or shortness of breath.  Marland Kitchen amiodarone (PACERONE) 200 MG tablet Take 200 mg by mouth at bedtime.   . benzonatate  (TESSALON) 200 MG capsule Take 1 capsule (200 mg total) by mouth 3 (three) times daily as needed for cough.  . carvedilol (COREG) 3.125 MG tablet Take 1 tablet (3.125 mg total) by mouth 2 (two) times daily.  . Cyanocobalamin (B-12) 2500 MCG TABS Take 2,500 mg by mouth daily.  Marland Kitchen donepezil (ARICEPT) 10 MG tablet Take 10 mg by mouth at bedtime.  Marland Kitchen ELIQUIS 5 MG TABS tablet Take 5 mg by mouth 2 (two) times daily.  . enalapril (VASOTEC) 2.5 MG tablet Take 2.5 mg by mouth at bedtime.   . finasteride (PROSCAR) 5 MG tablet TAKE 1 TABLET(5 MG) BY MOUTH DAILY  . furosemide (LASIX) 20 MG tablet Take 20 mg by mouth daily as needed. For feet swelling.  . isosorbide mononitrate (IMDUR) 30 MG 24 hr tablet Take 30 mg by mouth at bedtime.   Marland Kitchen loratadine (CLARITIN) 10 MG tablet Take 10 mg by mouth daily as needed for allergies.  . Melatonin 10 MG TABS Take by mouth.  . memantine (NAMENDA) 10 MG tablet Take 10 mg by mouth 2 (two) times daily.   . mirtazapine (REMERON) 15 MG tablet Take 15 mg by mouth at bedtime.   . Multiple Vitamin (MULTIVITAMIN WITH MINERALS) TABS tablet Take 1 tablet by mouth daily.  . nitrofurantoin, macrocrystal-monohydrate, (MACROBID) 100 MG capsule Take 1 capsule (100 mg total) by mouth at bedtime.  . rosuvastatin (CRESTOR) 40 MG tablet Take 40 mg by mouth at bedtime.  . tamsulosin (FLOMAX) 0.4 MG CAPS capsule TAKE 1 CAPSULE BY MOUTH EVERY DAY   No facility-administered encounter medications on file as of 10/27/2020.    Objective: ROS/ family   General: NAD EYES: denies vision changes ENMT: denies dysphagia Cardiovascular: denies chest pain Pulmonary: denies  cough, denies increased SOB Abdomen: endorses fair  To good appetite, endorses constipation, endorses continence of bowel GU: denies dysuria, endorses continence of urine, recent hematuria (gross), d/t catheter MSK:  endorses ROM limitations, no falls reported Skin: denies rashes or wounds Neurological: endorses weakness,  denies pain, denies insomnia Psych: Endorses positive mood, endorses cognitive impairment (family) Heme/lymph/immuno: denies bruises, abnormal bleeding  Physical Exam: Current and past weights: 184 lbs Constitutional:  NAD General: frail appearing, WNWD EYES: anicteric sclera, lids intact, no discharge  ENMT: hard of  hearing,oral mucous membranes moist, dentition intact CV: S1S2 S3, RRR, no LE edema Pulmonary: LCTA but diminished, slight  increased work of breathing, no cough, no audible wheezes, room air (oxygen in home and not functioning) Abdomen: intake 50%, normo-active BS +  4 quadrants, soft and non tender, no ascites GU: deferred MSK: mild sarcopenia, decreased ROM in all extremities, no  contractures of LE,  ambulatory Skin: warm and dry, no rashes or wounds on visible skin Neuro: Generalized weakness, moderately severe cognitive impairment Psych: non-anxious affect, A and O x 1-2 Hem/lymph/immuno: no widespread bruising   Thank you for the opportunity to participate in the care of Mr. Vivona.  The palliative care team will continue to follow. Please call our office at [phone] if we can be of additional assistance.  Jason Coop, NP , DNP, MPH, AGPCNP-BC, ACHPN   COVID-19 PATIENT SCREENING TOOL  Person answering questions: _______carolyn____________   1.  Is the patient or any family member in the home showing any signs or symptoms regarding respiratory infection?                  Person with Symptom  ______________na___________ a. Fever/chills/headache                                                        Yes___ No__X_            b. Shortness of breath                                                            Yes___ No__X_           c. Cough/congestion                                               Yes___  No__X_          d. Muscle/Body aches/pains                                                   Yes___ No__X_         e. Gastrointestinal symptoms  (diarrhea,nausea)             Yes___ No__X_         f. Sudden loss of smell or taste      Yes___ No__X_        2. Within the past 10 days, has anyone living in the home had any contact with someone with or under investigation for COVID-19?    Yes___ No__X__   Person __________________

## 2020-10-28 ENCOUNTER — Telehealth: Payer: Self-pay

## 2020-10-28 LAB — CULTURE, URINE COMPREHENSIVE

## 2020-10-28 NOTE — Telephone Encounter (Signed)
Phone call placed to [employer] on behalf of patient to report that oxygen concentrator appears to be shorting out. Concentrator will turn on but alarm is delayed some. On hold for an extended time. Message faxed to [facility].

## 2020-10-29 ENCOUNTER — Telehealth: Payer: Self-pay | Admitting: Primary Care

## 2020-10-29 ENCOUNTER — Telehealth: Payer: Self-pay | Admitting: Family Medicine

## 2020-10-29 NOTE — Telephone Encounter (Signed)
Patient's daughter notified and voiced understanding. 

## 2020-10-29 NOTE — Telephone Encounter (Signed)
Received call from [facility] day program. There  Are openings but pt has to be fully vaccinated, all 3. The program is $76/ day and there is funding thru [employer]. There are also other support systems thru [facility]. Will pass along to family RE the immunization need and application process.

## 2020-10-29 NOTE — Telephone Encounter (Signed)
-----   Message from [name], PA-C sent at 10/28/2020  4:48 PM EST ----- Please let his daughter, [name], know that his urine culture was negative.

## 2020-11-04 ENCOUNTER — Other Ambulatory Visit: Payer: Self-pay | Admitting: Urology

## 2020-11-14 ENCOUNTER — Other Ambulatory Visit: Payer: Self-pay | Admitting: Neurology

## 2020-11-14 DIAGNOSIS — F0281 Dementia in other diseases classified elsewhere with behavioral disturbance: Secondary | ICD-10-CM

## 2020-11-18 ENCOUNTER — Other Ambulatory Visit: Payer: Self-pay

## 2020-11-18 ENCOUNTER — Other Ambulatory Visit: Payer: Medicare HMO | Admitting: Primary Care

## 2020-11-18 DIAGNOSIS — I480 Paroxysmal atrial fibrillation: Secondary | ICD-10-CM

## 2020-11-18 DIAGNOSIS — F0280 Dementia in other diseases classified elsewhere without behavioral disturbance: Secondary | ICD-10-CM

## 2020-11-18 DIAGNOSIS — Z515 Encounter for palliative care: Secondary | ICD-10-CM

## 2020-11-18 DIAGNOSIS — I4811 Longstanding persistent atrial fibrillation: Secondary | ICD-10-CM

## 2020-11-18 DIAGNOSIS — G309 Alzheimer's disease, unspecified: Secondary | ICD-10-CM

## 2020-11-18 NOTE — Progress Notes (Signed)
[facility] Consult Note Telephone: [phone]  Fax: [phone]    Date of encounter: 11/18/20 PATIENT NAME: Chris Simon [address] [place] [address] There are no phone numbers on file. DOB: [birth year] MRN: [id number]  PRIMARY CARE PROVIDER:    [name], MD,  [place] [facility] [place] [address] [phone]  REFERRING PROVIDER:   [name], [place],  [place] [address] [phone]  RESPONSIBLE PARTY:   Extended Emergency Contact Information Primary Emergency Contact: [name],[name]: [address] [place], [place] [address] Johnnette Litter of [place] Phone: [phone] Relation: Daughter Secondary Emergency Contact: [place] Mobile Phone: [phone] Relation: Daughter  I met face to face with patient and friend in  Home. Palliative Care was asked to follow this patient by consultation request of Feldpausch, Chris Noa, MD to help address advance care planning and goals of care. This is a follow up  visit.   ASSESSMENT AND RECOMMENDATIONS:   1. Advance Care Planning/Goals of Care: Goals include to maximize quality of life and symptom management. Our advance care planning conversation included a discussion about:    Experiences with loved ones who have been seriously ill or have died   Exploration of personal, cultural or spiritual beliefs that might influence medical decisions  MOST on file with DNR, limited scope, use of abx, limited use of iv, no feeding tube.   2. Symptom Management:   I met with patient and a friend in his home. Friend stated daughter did not know I was coming despite appointment and reminder call. He is patient caregiver today. She has left with her daughter for her own appt. Patient states he feels well;  he's eating very well and jokes that if he were doing any better he would be two people.   Caregiver burden: We discussed some of  our previous issues of caregiver burden.  I recounted how I had looked into the adult day program in [place]. Patient and she would need to visit and see how he interacts. Before that he would have to be completely immunized ( 3 shots) against Covid. The charge is $75 a day which is an extremely good fee for an 8 to 10 hour day plus lunch. Friend stated daughter is  trying to apply for Medicaid on his behalf and needs to sell a car in order to do it. I feel some sort of caregiving respite is very important whether it be an outside day program or in-home care workers.  I left my card with instructions for [name]  to reach out when scheduling was convenient.   3. Follow up Palliative Care Visit: Palliative care will continue to follow for goals of care clarification and symptom management. Return 8 weeks or prn.  4. Family /Caregiver/Community Supports: Daughter lives in to care for pt. Lack of community supports.  5. Cognitive / Functional decline: A and O x 2, FAST score 6C. Endorses he can do adls (I)  But he cannot and he resists help. Dependent in most adls and all ialds.  I spent 40 minutes providing this consultation,  from 1430 to 1510. More than 50% of the time in this consultation was spent in counseling and care coordination.  CODE STATUS: DNR PPS: 50%  HOSPICE ELIGIBILITY/DIAGNOSIS: TBD  Subjective:  CHIEF COMPLAINT: agitated behavior, dementia  HISTORY OF PRESENT ILLNESS:  Chris Simon  is a 87 y.o. year old male  with h/o a fib, AD stage 6c (moderately severe), COPD, urinary retention, h/o covid pneumonia. Today he presents with ongoing dementia behaviors of resistance. Denies elopement but resists assistance in care. Caregiver strain continues to be an issue.  We are asked to consult around advance care planning and complex medical decision making.    Review and summarization of old Epic records shows or history from other than patient. .  History obtained from  review of EMR, discussion with primary team, and  interview with family, caregiver  and/or [name]. Records reviewed and summarized above.   CURRENT PROBLEM LIST:  Patient Active Problem List   Diagnosis Date Noted  . Dehydration   . Hypotension   . Pneumonia due to COVID-19 virus 11/01/2019  . COPD (chronic obstructive pulmonary disease) ([place])   . Acute respiratory failure with hypoxia ([place]) 10/22/2019  . Chronic indwelling Foley catheter   . Alzheimer's dementia without behavioral disturbance ([place])   . Pneumonia 02/20/2017  . TIA (transient ischemic attack) 02/18/2017  . Left inguinal hernia 06/22/2016  . Umbilical hernia without obstruction and without gangrene 06/22/2016  . Calculus of kidney 09/03/2015  . Testicular cyst 09/03/2015  . Sepsis secondary to UTI ([place]) 07/20/2015  . BPH (benign prostatic hyperplasia) 07/18/2015  . Elevated PSA 07/18/2015  . Sepsis ([place]) 07/09/2015  . Acute kidney injury ([place])   . Paroxysmal A-fib ([place])   . Lower urinary tract infectious disease   . Atrial fibrillation ([place]) 10/01/2014  . Pure hypercholesterolemia 10/01/2014  . Benign fibroma of prostate 10/06/2012  . Bladder retention 10/06/2012   PAST MEDICAL HISTORY:  Active Ambulatory Problems    Diagnosis Date Noted  . Sepsis ([place]) 07/09/2015  . Acute kidney injury ([place])   . Paroxysmal A-fib ([place])   . Lower urinary tract infectious disease   . BPH (benign prostatic hyperplasia) 07/18/2015  . Elevated PSA 07/18/2015  . Sepsis secondary to UTI ([place]) 07/20/2015  . Atrial fibrillation ([place]) 10/01/2014  . Benign fibroma of prostate 10/06/2012  . Calculus of kidney 09/03/2015  . Pure hypercholesterolemia 10/01/2014  . Testicular cyst 09/03/2015  . Bladder retention 10/06/2012  . Left inguinal hernia 06/22/2016  . Umbilical hernia without obstruction and without gangrene 06/22/2016  . TIA (transient ischemic attack) 02/18/2017  . Pneumonia 02/20/2017  . Acute respiratory failure with  hypoxia ([place]) 10/22/2019  . Chronic indwelling Foley catheter   . Alzheimer's dementia without behavioral disturbance ([place])   . Pneumonia due to COVID-19 virus 11/01/2019  . COPD (chronic obstructive pulmonary disease) ([place])   . Dehydration   . Hypotension    Resolved Ambulatory Problems    Diagnosis Date Noted  . Choledocholithiasis s/p ERCP 07/09/2015   . Acute cholecystitis with chronic cholecystitis s/p lap chole 07/11/2015 07/13/2015  . Acute cholecystitis 07/14/2015   Past Medical History:  Diagnosis Date  . Aneurysm ([place])   . Blockage of a bile duct   . BPH with obstruction/lower urinary tract symptoms   . Cellulitis of scrotum   . Dementia ([place])   . Diverticulosis   . Dyspnea   . Dysrhythmia   . Epididymitis   . Gallstone   . Gross hematuria   . History of kidney stones   . Hyperlipidemia   . Kidney stones   . Nocturia   . Testicular pain   . Urinary retention    SOCIAL HX:  Social History   Tobacco Use  . Smoking status: Former Smoker  Packs/day: 0.50    Types: Cigarettes  . Smokeless tobacco: Never Used  . Tobacco comment: quit 40 years  Substance Use Topics  . Alcohol use: No    Alcohol/week: 0.0 standard drinks   FAMILY HX:  Family History  Problem Relation Age of Onset  . Prostate cancer Brother   . Kidney disease Neg Hx   . Bladder Cancer Neg Hx       ALLERGIES:  Allergies  Allergen Reactions  . Seroquel [Quetiapine Fumarate]     confusion  . Clindamycin Hcl Rash    Unknown reaction  Reaction: Unknown  Unknown reaction   . Sulfamethoxazole-Trimethoprim Rash     PERTINENT MEDICATIONS:  Outpatient Encounter Medications as of 11/18/2020  Medication Sig  . acetaminophen (TYLENOL) 325 MG tablet Take 650 mg by mouth every 6 (six) hours as needed.  Marland Kitchen albuterol (VENTOLIN HFA) 108 (90 Base) MCG/ACT inhaler Inhale 1-2 puffs into the lungs every 4 (four) hours as needed for wheezing or shortness of breath.  Marland Kitchen amiodarone (PACERONE) 200 MG  tablet Take 200 mg by mouth at bedtime.   . benzonatate (TESSALON) 200 MG capsule Take 1 capsule (200 mg total) by mouth 3 (three) times daily as needed for cough.  . carvedilol (COREG) 3.125 MG tablet Take 1 tablet (3.125 mg total) by mouth 2 (two) times daily.  . Cyanocobalamin (B-12) 2500 MCG TABS Take 2,500 mg by mouth daily.  Marland Kitchen donepezil (ARICEPT) 10 MG tablet Take 10 mg by mouth at bedtime.  Marland Kitchen ELIQUIS 5 MG TABS tablet Take 5 mg by mouth 2 (two) times daily.  . enalapril (VASOTEC) 2.5 MG tablet Take 2.5 mg by mouth at bedtime.   . finasteride (PROSCAR) 5 MG tablet TAKE 1 TABLET(5 MG) BY MOUTH DAILY  . furosemide (LASIX) 20 MG tablet Take 20 mg by mouth daily as needed. For feet swelling.  . isosorbide mononitrate (IMDUR) 30 MG 24 hr tablet Take 30 mg by mouth at bedtime.   Marland Kitchen loratadine (CLARITIN) 10 MG tablet Take 10 mg by mouth daily as needed for allergies.  . Melatonin 10 MG TABS Take by mouth.  . memantine (NAMENDA) 10 MG tablet Take 10 mg by mouth 2 (two) times daily.   . mirtazapine (REMERON) 15 MG tablet Take 15 mg by mouth at bedtime.   . Multiple Vitamin (MULTIVITAMIN WITH MINERALS) TABS tablet Take 1 tablet by mouth daily.  . nitrofurantoin, macrocrystal-monohydrate, (MACROBID) 100 MG capsule Take 1 capsule (100 mg total) by mouth at bedtime.  . rosuvastatin (CRESTOR) 40 MG tablet Take 40 mg by mouth at bedtime.  . tamsulosin (FLOMAX) 0.4 MG CAPS capsule TAKE 1 CAPSULE BY MOUTH EVERY DAY  . Vibegron (GEMTESA) 75 MG TABS Take 75 mg by mouth daily.   No facility-administered encounter medications on file as of 11/18/2020.    Objective: ROS  General: NAD ENMT: denies dysphagia Pulmonary: denies  cough, denies increased SOB Abdomen: endorses good appetite, denies constipation, endorses continence of bowel GU: denies dysuria, endorses continence of urine, denies hematuria MSK:  endorses ROM limitations, no falls reported Skin: denies rashes or wounds Neurological: denies   weakness, denies pain, denies insomnia Psych: Endorses positive mood Heme/lymph/immuno: denies bruises, abnormal bleeding  Physical Exam: Current and past weights: WNL Constitutional:  NAD General: frail appearing, thin EYES: anicteric sclera, lids intact, no discharge  ENMT: intact hearing,oral mucous membranes moist, dentition intact CV: no LE edema Pulmonary:  no increased work of breathing, no cough,  room air Abdomen: intake  100%,  no ascites GU: deferred MSK: mild sarcopenia, decreased ROM in all extremities, no contractures of LE, (I) ambulatory Skin: warm and dry, no rashes or wounds on visible skin Neuro: Generalized weakness, moderately severe cognitive impairment, FAST 6C Psych: non-anxious affect, A and O x 2 Hem/lymph/immuno: no widespread bruising   Thank you for the opportunity to participate in the care of Mr. Keimig.  The palliative care team will continue to follow. Please call our office at [phone] if we can be of additional assistance.  Jason Coop, NP , DNP, MPH, AGPCNP-BC, ACHPN   COVID-19 PATIENT SCREENING TOOL  Person answering questions: _______pastor friend____________   1.  Is the patient or any family member in the home showing any signs or symptoms regarding respiratory infection?                  Person with Symptom  ______________na___________ a. Fever/chills/headache                                                        Yes___ No__X_            b. Shortness of breath                                                            Yes___ No__X_           c. Cough/congestion                                               Yes___  No__X_          d. Muscle/Body aches/pains                                                   Yes___ No__X_         e. Gastrointestinal symptoms (diarrhea,nausea)             Yes___ No__X_         f. Sudden loss of smell or taste      Yes___ No__X_        2. Within the past 10 days, has anyone living in the home  had any contact with someone with or under investigation for COVID-19?    Yes___ No__X__   Person __________________

## 2020-11-27 ENCOUNTER — Ambulatory Visit
Admission: EM | Admit: 2020-11-27 | Discharge: 2020-11-27 | Disposition: A | Discharge status: Home / Self Care | Payer: Medicare HMO | Attending: Family Medicine | Admitting: Family Medicine

## 2020-11-27 ENCOUNTER — Encounter: Payer: Self-pay | Admitting: Emergency Medicine

## 2020-11-27 ENCOUNTER — Ambulatory Visit
Admission: RE | Admit: 2020-11-27 | Discharge: 2020-11-27 | Disposition: A | Discharge status: Home / Self Care | Payer: Medicare HMO | Source: Ambulatory Visit | Attending: Neurology | Admitting: Neurology

## 2020-11-27 ENCOUNTER — Other Ambulatory Visit: Payer: Self-pay

## 2020-11-27 DIAGNOSIS — Z881 Allergy status to other antibiotic agents status: Secondary | ICD-10-CM | POA: Diagnosis not present

## 2020-11-27 DIAGNOSIS — G309 Alzheimer's disease, unspecified: Secondary | ICD-10-CM

## 2020-11-27 DIAGNOSIS — N3000 Acute cystitis without hematuria: Secondary | ICD-10-CM | POA: Diagnosis not present

## 2020-11-27 DIAGNOSIS — Z7901 Long term (current) use of anticoagulants: Secondary | ICD-10-CM | POA: Diagnosis not present

## 2020-11-27 DIAGNOSIS — F0281 Dementia in other diseases classified elsewhere with behavioral disturbance: Secondary | ICD-10-CM

## 2020-11-27 DIAGNOSIS — R4182 Altered mental status, unspecified: Secondary | ICD-10-CM | POA: Insufficient documentation

## 2020-11-27 DIAGNOSIS — Z87891 Personal history of nicotine dependence: Secondary | ICD-10-CM | POA: Diagnosis not present

## 2020-11-27 DIAGNOSIS — Z79899 Other long term (current) drug therapy: Secondary | ICD-10-CM | POA: Diagnosis not present

## 2020-11-27 LAB — URINALYSIS, COMPLETE (UACMP) WITH MICROSCOPIC
Glucose, UA: NEGATIVE mg/dL
Nitrite: POSITIVE — AB
Protein, ur: NEGATIVE mg/dL
Specific Gravity, Urine: >1.03 — ABNORMAL HIGH (ref 1.005–1.030)
WBC, UA: >50 WBC/hpf (ref 0–5)
pH: 5 (ref 5.0–8.0)

## 2020-11-27 MED ORDER — CEPHALEXIN 500 MG PO CAPS: 500.0000 mg | ORAL_CAPSULE | Freq: Two times a day (BID) | ORAL | 0 refills | Status: AC

## 2020-11-27 NOTE — ED Triage Notes (Signed)
Daughter reports patient has been confused and his urine has a strong odor that has been going on for 2 -3 days.

## 2020-11-28 NOTE — ED Provider Notes (Signed)
[facility]    CSN: [id number] Arrival date & time: 11/27/20  1507  History   Chief Complaint Chief Complaint  Patient presents with  . Altered Mental Status  . Urinary Frequency   HPI  87 year old male presents for evaluation of the above.   Daughter states that he has been more confused for the past 2-3 days. He is having urinary frequency, incontinence and malodorous urine. She is concerned that he has a UTI. No fever. No abdominal pain. No other associated symptoms. No other complaints.  Past Medical History:  Diagnosis Date  . Aneurysm ([place])    Abdominal Aortic Aneurysm  . Atrial fibrillation ([place])   . Blockage of a bile duct   . BPH with obstruction/lower urinary tract symptoms   . Cellulitis of scrotum   . COPD (chronic obstructive pulmonary disease) ([place])   . Dementia ([place])   . Diverticulosis   . Dyspnea    with exertion  . Dysrhythmia   . Elevated PSA   . Epididymitis   . Gallstone   . Gross hematuria   . History of kidney stones   . Hyperlipidemia   . Kidney stones   . Nocturia   . Testicular pain   . TIA (transient ischemic attack)    June 2018  . Urinary retention     Patient Active Problem List   Diagnosis Date Noted  . Dehydration   . Hypotension   . Pneumonia due to COVID-19 virus 11/01/2019  . COPD (chronic obstructive pulmonary disease) ([place])   . Acute respiratory failure with hypoxia ([place]) 10/22/2019  . Chronic indwelling Foley catheter   . Alzheimer's dementia without behavioral disturbance ([place])   . Pneumonia 02/20/2017  . TIA (transient ischemic attack) 02/18/2017  . Left inguinal hernia 06/22/2016  . Umbilical hernia without obstruction and without gangrene 06/22/2016  . Calculus of kidney 09/03/2015  . Testicular cyst 09/03/2015  . Sepsis secondary to UTI ([place]) 07/20/2015  . BPH (benign prostatic hyperplasia) 07/18/2015  . Elevated PSA 07/18/2015  . Sepsis ([place]) 07/09/2015  . Acute kidney injury ([place])   . Paroxysmal  A-fib ([place])   . Lower urinary tract infectious disease   . Atrial fibrillation ([place]) 10/01/2014  . Pure hypercholesterolemia 10/01/2014  . Benign fibroma of prostate 10/06/2012  . Bladder retention 10/06/2012    Past Surgical History:  Procedure Laterality Date  . CHOLECYSTECTOMY N/A 07/11/2015   Procedure: LAPAROSCOPIC CHOLECYSTECTOMY;  Surgeon: Rolm Bookbinder, MD;  Location: [facility];  Service: General;  Laterality: N/A;  . ERCP N/A 07/09/2015   Procedure: ENDOSCOPIC RETROGRADE CHOLANGIOPANCREATOGRAPHY (ERCP);  Surgeon: Clarene Essex, MD;  Location: [facility];  Service: Endoscopy;  Laterality: N/A;  . INGUINAL HERNIA REPAIR Left 04/07/2017   Procedure: HERNIA REPAIR INGUINAL ADULT;  Surgeon: Clayburn Pert, MD;  Location: [facility];  Service: General;  Laterality: Left;  . INSERTION OF MESH Left 04/07/2017   Procedure: INSERTION OF MESH;  Surgeon: Clayburn Pert, MD;  Location: [facility];  Service: General;  Laterality: Left;  . KYPHOPLASTY N/A 11/16/2018   Procedure: KYPHOPLASTY T12;  Surgeon: Hessie Knows, MD;  Location: [facility];  Service: Orthopedics;  Laterality: N/A;  . LEFT HEART CATH AND CORONARY ANGIOGRAPHY N/A 01/10/2018   Procedure: LEFT HEART CATH AND CORONARY ANGIOGRAPHY;  Surgeon: Dionisio David, MD;  Location: [facility];  Service: Cardiovascular;  Laterality: N/A;       Home Medications    Prior to Admission medications   Medication Sig Start  Date End Date Taking? Authorizing Provider  acetaminophen (TYLENOL) 325 MG tablet Take 650 mg by mouth every 6 (six) hours as needed.   Yes [provider]  albuterol (VENTOLIN HFA) 108 (90 Base) MCG/ACT inhaler Inhale 1-2 puffs into the lungs every 4 (four) hours as needed for wheezing or shortness of breath. 10/25/19  Yes Fritzi Mandes, MD  amiodarone (PACERONE) 200 MG tablet Take 200 mg by mouth at bedtime.    Yes [provider]  carvedilol (COREG) 3.125 MG tablet Take 1 tablet (3.125 mg total) by  mouth 2 (two) times daily. 11/05/19  Yes Lorella Nimrod, MD  cephALEXin (KEFLEX) 500 MG capsule Take 1 capsule (500 mg total) by mouth 2 (two) times daily. 11/27/20  Yes Faron Tudisco G, DO  Cyanocobalamin (B-12) 2500 MCG TABS Take 2,500 mg by mouth daily.   Yes [provider]  donepezil (ARICEPT) 10 MG tablet Take 10 mg by mouth at bedtime.   Yes [provider]  ELIQUIS 5 MG TABS tablet Take 5 mg by mouth 2 (two) times daily. 01/27/20  Yes [provider]  finasteride (PROSCAR) 5 MG tablet TAKE 1 TABLET(5 MG) BY MOUTH DAILY 08/13/20  Yes Vaillancourt, Samantha, PA-C  furosemide (LASIX) 20 MG tablet Take 20 mg by mouth daily as needed. For feet swelling.   Yes [provider]  loratadine (CLARITIN) 10 MG tablet Take 10 mg by mouth daily as needed for allergies.   Yes [provider]  Melatonin 10 MG TABS Take by mouth.   Yes [provider]  mirtazapine (REMERON) 15 MG tablet Take 15 mg by mouth at bedtime.  04/17/18  Yes [provider]  Multiple Vitamin (MULTIVITAMIN WITH MINERALS) TABS tablet Take 1 tablet by mouth daily.   Yes [provider]  rosuvastatin (CRESTOR) 40 MG tablet Take 40 mg by mouth at bedtime.   Yes [provider]  tamsulosin (FLOMAX) 0.4 MG CAPS capsule TAKE 1 CAPSULE BY MOUTH EVERY DAY 08/13/20  Yes Vaillancourt, Samantha, PA-C  Vibegron (GEMTESA) 75 MG TABS Take 75 mg by mouth daily. 10/27/20  Yes McGowan, Larene Beach A, PA-C  enalapril (VASOTEC) 2.5 MG tablet Take 2.5 mg by mouth at bedtime.  04/12/18 11/27/20  [provider]  memantine (NAMENDA) 10 MG tablet Take 10 mg by mouth 2 (two) times daily.   11/27/20  [provider]    Family History Family History  Problem Relation Age of Onset  . Prostate cancer Brother   . Kidney disease Neg Hx   . Bladder Cancer Neg Hx     Social History Social History   Tobacco Use  . Smoking status: Former Smoker    Packs/day: 0.50    Types:  Cigarettes  . Smokeless tobacco: Never Used  . Tobacco comment: quit 40 years  Vaping Use  . Vaping Use: Never used  Substance Use Topics  . Alcohol use: No    Alcohol/week: 0.0 standard drinks  . Drug use: No     Allergies   Seroquel [quetiapine fumarate], Clindamycin hcl, and Sulfamethoxazole-trimethoprim   Review of Systems Review of Systems Per HPI  Physical Exam Triage Vital Signs ED Triage Vitals  Enc Vitals Group     BP 11/27/20 1557 120/62     Pulse Rate 11/27/20 1557 76     Resp 11/27/20 1557 18     Temp 11/27/20 1557 98.3 F (36.8 C)     Temp Source 11/27/20 1557 Oral     SpO2 11/27/20  1557 96 %     Weight 11/27/20 1553 181 lb (82.1 kg)     Height 11/27/20 1553 6' (1.829 m)     Head Circumference --      Peak Flow --      Pain Score 11/27/20 1553 0     Pain Loc --      Pain Edu? --      Excl. in [place]? --    No data found.  Updated Vital Signs BP 120/62 (BP Location: Left Arm)   Pulse 76   Temp 98.3 F (36.8 C) (Oral)   Resp 18   Ht 6' (1.829 m)   Wt 82.1 kg   SpO2 96%   BMI 24.55 kg/m   Visual Acuity Right Eye Distance:   Left Eye Distance:   Bilateral Distance:    Right Eye Near:   Left Eye Near:    Bilateral Near:     Physical Exam Constitutional:      General: He is not in acute distress.    Appearance: Normal appearance.  HENT:     Head: Normocephalic and atraumatic.  Eyes:     General:        Right eye: No discharge.        Left eye: No discharge.     Conjunctiva/sclera: Conjunctivae normal.  Cardiovascular:     Rate and Rhythm: Normal rate and regular rhythm.  Pulmonary:     Effort: Pulmonary effort is normal.     Breath sounds: Normal breath sounds. No wheezing or rales.  Neurological:     Mental Status: He is alert.    UC Treatments / Results  Labs (all labs ordered are listed, but only abnormal results are displayed) Labs Reviewed  URINALYSIS, COMPLETE (UACMP) WITH MICROSCOPIC - Abnormal; Notable for the following  components:      Result Value   APPearance HAZY (*)    Specific Gravity, Urine >1.030 (*)    Hgb urine dipstick TRACE (*)    Bilirubin Urine SMALL (*)    Ketones, ur TRACE (*)    Nitrite POSITIVE (*)    Leukocytes,Ua SMALL (*)    Bacteria, UA MANY (*)    All other components within normal limits  URINE CULTURE    EKG   Radiology CT HEAD WO CONTRAST  Result Date: 11/28/2020 CLINICAL DATA:  Alzheimer's disease.  Baseline scan EXAM: CT HEAD WITHOUT CONTRAST TECHNIQUE: Contiguous axial images were obtained from the base of the skull through the vertex without intravenous contrast. COMPARISON:  10/14/2018 FINDINGS: Brain: Generalized cortical volume loss with mild progression from 2018. There has been an attendant 1 mm increase in lateral ventricular diameter at the frontal horns. No collection, cortical infarct, hydrocephalus, or masslike finding. Vascular: Negative Skull: Negative Sinuses/Orbits: Negative IMPRESSION: 1. No reversible finding or recent insult. 2. Generalized volume loss since 2018. Electronically Signed   By: Monte Fantasia M.D.   On: 11/28/2020 05:22    Procedures Procedures (including critical care time)  Medications Ordered in UC Medications - No data to display  Initial Impression / Assessment and Plan / UC Course  I have reviewed the triage vital signs and the nursing notes.  Pertinent labs & imaging results that were available during my care of the patient were reviewed by me and considered in my medical decision making (see chart for details).    87 year old male presents with UTI. Sending culture. Treating with Keflex.   Final Clinical Impressions(s) / UC Diagnoses  Final diagnoses:  Acute cystitis without hematuria   Discharge Instructions   None    ED Prescriptions    Medication Sig Dispense Auth. Provider   cephALEXin (KEFLEX) 500 MG capsule Take 1 capsule (500 mg total) by mouth 2 (two) times daily. 14 capsule Thersa Salt G, DO     PDMP not  reviewed this encounter.   Coral Spikes, [place] 11/28/20 2313

## 2020-11-29 LAB — URINE CULTURE

## 2021-01-07 DIAGNOSIS — R531 Weakness: Secondary | ICD-10-CM | POA: Insufficient documentation

## 2021-06-12 IMAGING — CR DG ABDOMEN 1V
2 series · 2 of 2 positions shown · non-contrast
Comparison: September 16, 2020.

CLINICAL DATA: Kidney stone.

EXAM:
ABDOMEN - 1 VIEW

[abdomen kub (1 of 2)]
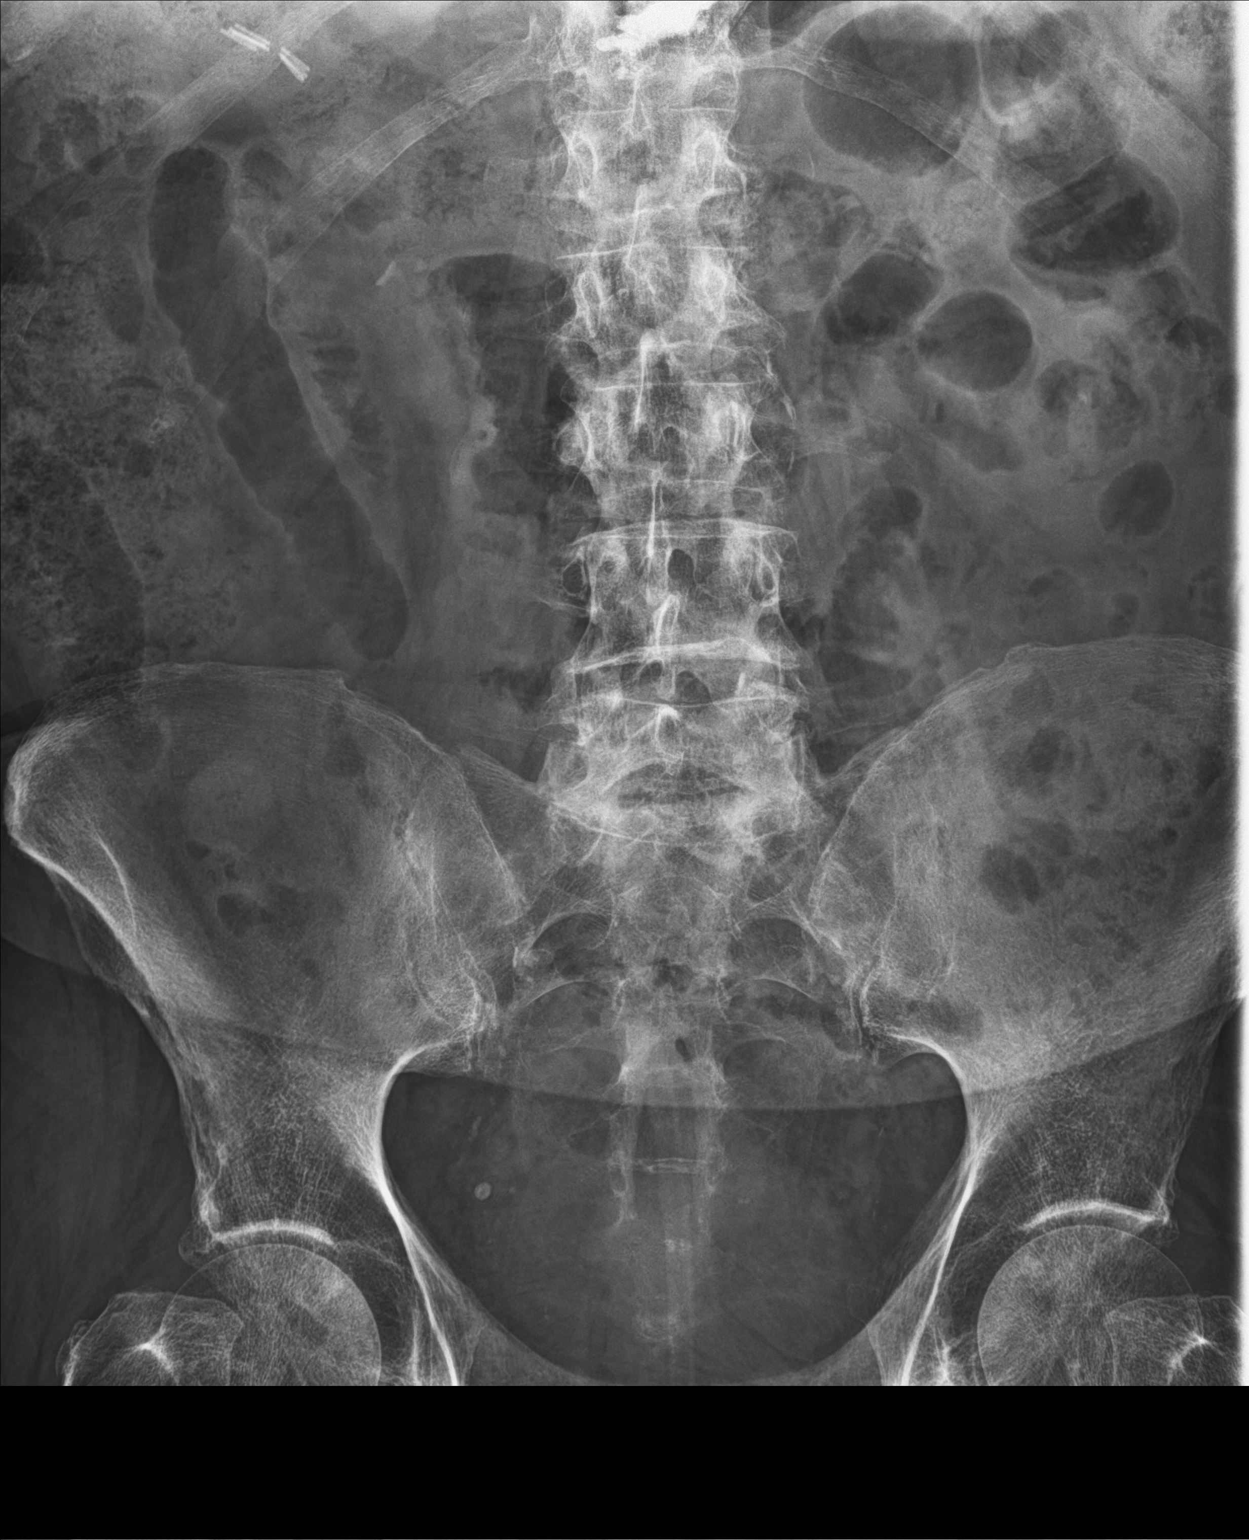

[abdomen kub (2 of 2)]
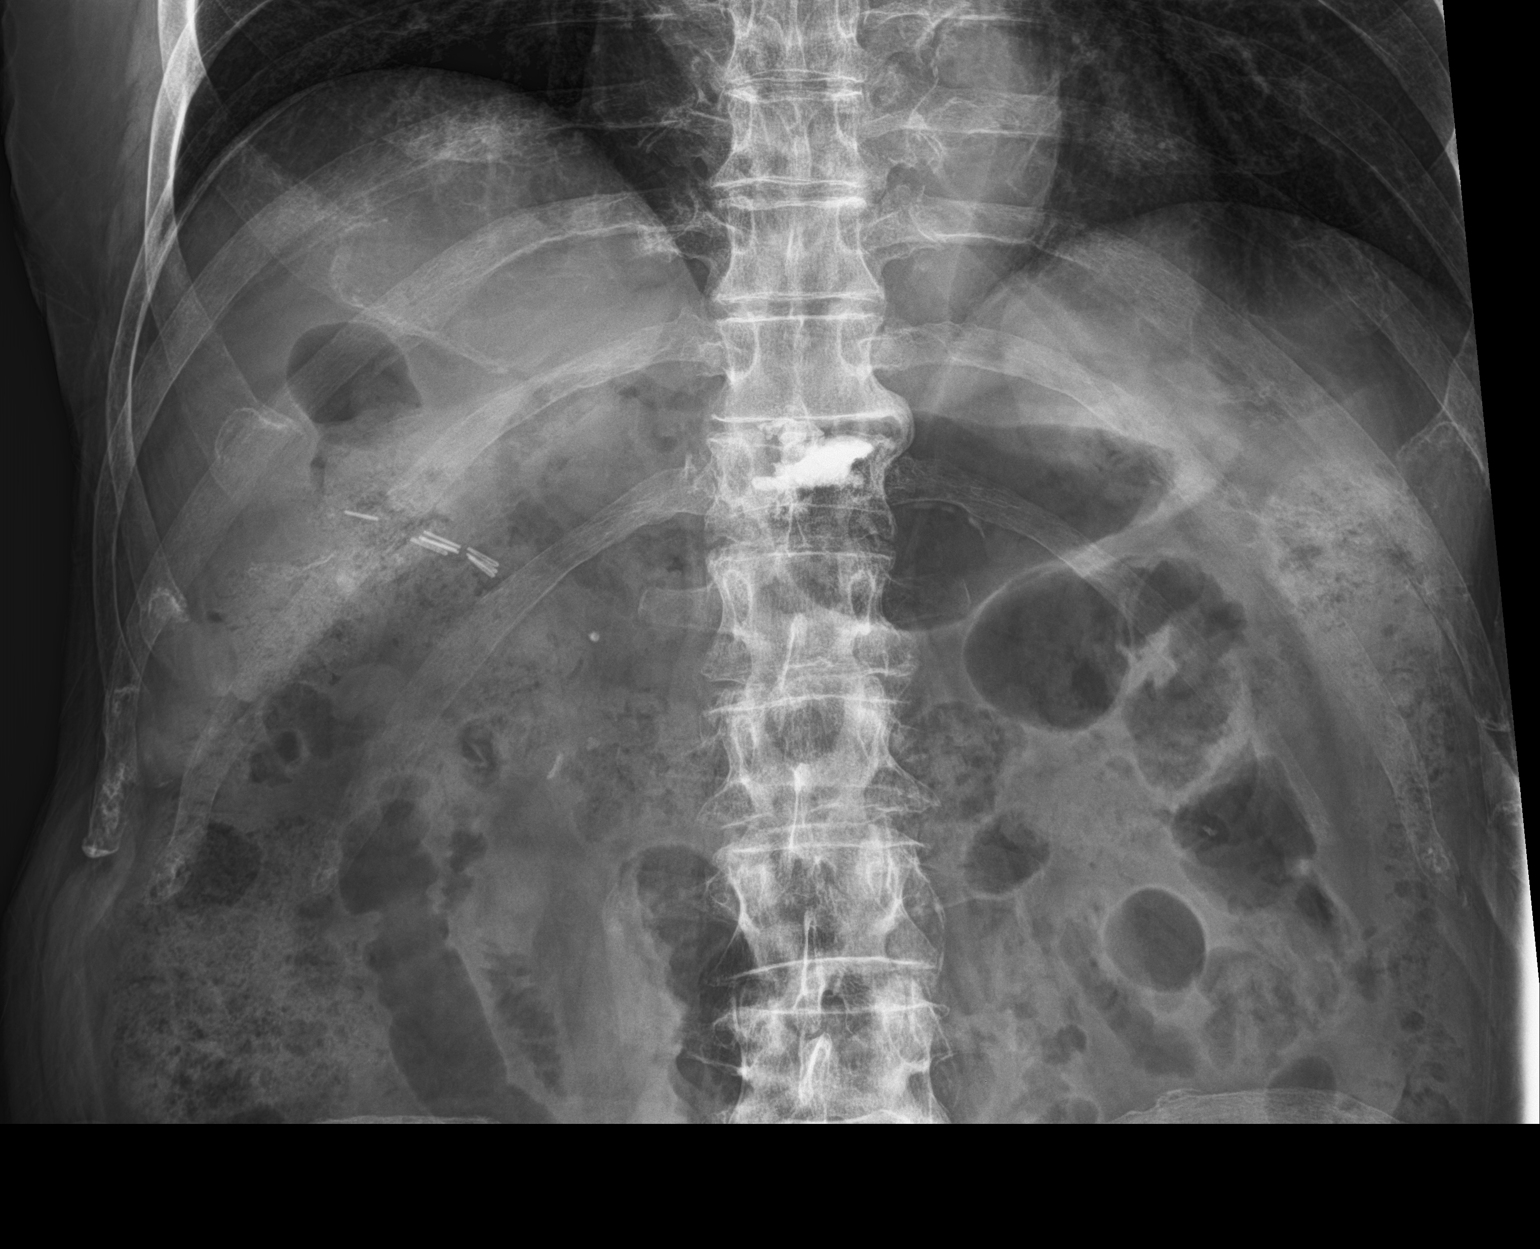

[2 of 2 positions shown; findings below may reference images not displayed]

FINDINGS: The bowel gas pattern is normal. Status post cholecystectomy. Stable
small nonobstructive left renal calculus.
IMPRESSION: Stable small nonobstructive left renal calculus.

## 2021-07-31 ENCOUNTER — Other Ambulatory Visit: Payer: Self-pay | Admitting: Physician Assistant

## 2021-07-31 DIAGNOSIS — N138 Other obstructive and reflux uropathy: Secondary | ICD-10-CM

## 2021-07-31 DIAGNOSIS — N401 Enlarged prostate with lower urinary tract symptoms: Secondary | ICD-10-CM

## 2021-08-14 ENCOUNTER — Telehealth: Payer: Self-pay

## 2021-08-14 ENCOUNTER — Other Ambulatory Visit: Payer: Self-pay

## 2021-08-14 ENCOUNTER — Encounter: Payer: Self-pay | Admitting: Gastroenterology

## 2021-08-14 ENCOUNTER — Ambulatory Visit: Payer: Medicare HMO | Admitting: Gastroenterology

## 2021-08-14 VITALS — BP 119/65 | HR 67 | Temp 97.2°F | Ht 66.0 in | Wt 154.2 lb

## 2021-08-14 DIAGNOSIS — K642 Third degree hemorrhoids: Secondary | ICD-10-CM | POA: Diagnosis not present

## 2021-08-14 DIAGNOSIS — K529 Noninfective gastroenteritis and colitis, unspecified: Secondary | ICD-10-CM

## 2021-08-14 NOTE — Progress Notes (Signed)
PROCEDURE NOTE: The patient presents with symptomatic grade 2 hemorrhoids, unresponsive to maximal medical therapy, requesting rubber band ligation of his/her hemorrhoidal disease.  All risks, benefits and alternative forms of therapy were described and informed consent was obtained.  In the Left Lateral Decubitus position (if anoscopy is performed) anoscopic examination revealed grade 2 hemorrhoids in the RA, RP, and LL position(s).  Largest was left lateral hemorrhoid, then right posterior hemorrhoid  The decision was made to band the LL internal hemorrhoid, and the [place] was used to perform band ligation without complication.  Digital anorectal examination was then performed to assure proper positioning of the band, and to adjust the banded tissue as required.  The patient was discharged home without pain or other issues.  Dietary and behavioral recommendations were given and (if necessary - prescriptions were given), along with follow-up instructions.  The patient will return 2 weeks for follow-up and possible additional banding as required.  No complications were encountered and the patient tolerated the procedure well.

## 2021-08-14 NOTE — Progress Notes (Signed)
Cephas Darby, MD [address]  [place]  [place], [place] [address]  Main: [phone]  Fax: [phone]    Gastroenterology Consultation  Referring Provider:     [name], MD Primary Care Physician:  [name], MD Primary Gastroenterologist:  Dr. Cephas Darby Reason for Consultation:     Chronic diarrhea, rectal pain        HPI:   Chris Simon is a 87 y.o. male referred by Dr. Ellison Hughs Chrissie Noa, MD  for consultation & management of chronic diarrhea and rectal pain.  Patient is a poor historian, most of the history is provided by patient's Simon who is his healthcare power of attorney and lives with the patient.  Patient has dementia and currently under home hospice.  Per patient's Simon, he has been complaining of rectal pain whenever he has a bowel movement and she noticed a protrusion of soft tissue every time he has a bowel movement and has to push inside.  He tried Anusol cream externally and per rectum which not provide much relief.  She does notice scant blood on wiping.  She also noticed that he has been having 4-5 loose/mushy bowel movements per day for last 3 months.  He is not eating much and has been losing weight.  No evidence of anemia. Patient denies any abdominal pain, bloating, nausea, vomiting S/p cholecystectomy in 2016 secondary to gallstone pancreatitis, s/p ERCP in 2016 with sphincterotomy and CBD stone extraction NSAIDs: None  Antiplts/Anticoagulants/Anti thrombotics: Aspirin 81 mg daily, Eliquis has been stopped  GI Procedures: His Simon reports that he had a colonoscopy within last 10 years  Past Medical History:  Diagnosis Date   Aneurysm ([place])    Abdominal Aortic Aneurysm   Atrial fibrillation ([facility])    Blockage of a bile duct    BPH with obstruction/lower urinary tract symptoms    Cellulitis of scrotum    COPD (chronic obstructive pulmonary disease) ([place])    Dementia ([place])    Diverticulosis    Dyspnea     with exertion   Dysrhythmia    Elevated PSA    Epididymitis    Gallstone    Gross hematuria    History of kidney stones    Hyperlipidemia    Kidney stones    Nocturia    Testicular pain    TIA (transient ischemic attack)    June 2018   Urinary retention     Past Surgical History:  Procedure Laterality Date   CHOLECYSTECTOMY N/A 07/11/2015   Procedure: LAPAROSCOPIC CHOLECYSTECTOMY;  Surgeon: Rolm Bookbinder, MD;  Location: [facility];  Service: General;  Laterality: N/A;   ERCP N/A 07/09/2015   Procedure: ENDOSCOPIC RETROGRADE CHOLANGIOPANCREATOGRAPHY (ERCP);  Surgeon: Clarene Essex, MD;  Location: [facility] END[facility]COPY;  Service: Endoscopy;  Laterality: N/A;   INGUINAL HERNIA REPAIR Left 04/07/2017   Procedure: HERNIA REPAIR INGUINAL ADULT;  Surgeon: Clayburn Pert, MD;  Location: [facility] ORS;  Service: General;  Laterality: Left;   INSERTION OF MESH Left 04/07/2017   Procedure: INSERTION OF MESH;  Surgeon: Clayburn Pert, MD;  Location: [facility];  Service: General;  Laterality: Left;   KYPHOPLASTY N/A 11/16/2018   Procedure: KYPHOPLASTY T12;  Surgeon: Hessie Knows, MD;  Location: [facility];  Service: Orthopedics;  Laterality: N/A;   LEFT HEART CATH AND CORONARY ANGIOGRAPHY N/A 01/10/2018   Procedure: LEFT HEART CATH AND CORONARY ANGIOGRAPHY;  Surgeon: Dionisio David, MD;  Location: [facility];  Service: Cardiovascular;  Laterality: N/A;   Current Outpatient Medications:    acetaminophen (TYLENOL) 325 MG tablet, Take 650 mg by mouth every 6 (six) hours as needed., Disp: , Rfl:    albuterol (VENTOLIN HFA) 108 (90 Base) MCG/ACT inhaler, Inhale 1-2 puffs into the lungs every 4 (four) hours as needed for wheezing or shortness of breath., Disp: 6.7 g, Rfl: 0   amiodarone (PACERONE) 200 MG tablet, Take 200 mg by mouth at bedtime. , Disp: , Rfl:    carvedilol (COREG) 3.125 MG tablet, Take 1 tablet (3.125 mg total) by mouth 2 (two) times daily., Disp:  , Rfl:    cephALEXin (KEFLEX) 500 MG  capsule, Take 1 capsule (500 mg total) by mouth 2 (two) times daily., Disp: 14 capsule, Rfl: 0   Cyanocobalamin (B-12) 2500 MCG TABS, Take 2,500 mg by mouth daily., Disp: , Rfl:    donepezil (ARICEPT) 10 MG tablet, Take 10 mg by mouth at bedtime., Disp: , Rfl:    finasteride (PROSCAR) 5 MG tablet, TAKE 1 TABLET(5 MG) BY MOUTH DAILY, Disp: 90 tablet, Rfl: 3   furosemide (LASIX) 20 MG tablet, Take 20 mg by mouth daily as needed. For feet swelling., Disp: , Rfl:    loratadine (CLARITIN) 10 MG tablet, Take 10 mg by mouth daily as needed for allergies., Disp: , Rfl:    Melatonin 10 MG TABS, Take by mouth., Disp: , Rfl:    mirtazapine (REMERON) 15 MG tablet, Take 15 mg by mouth at bedtime. , Disp: , Rfl:    Multiple Vitamin (MULTIVITAMIN WITH MINERALS) TABS tablet, Take 1 tablet by mouth daily., Disp: , Rfl:    rosuvastatin (CRESTOR) 40 MG tablet, Take 40 mg by mouth at bedtime., Disp: , Rfl:    tamsulosin (FLOMAX) 0.4 MG CAPS capsule, TAKE 1 CAPSULE BY MOUTH EVERY DAY, Disp: 90 capsule, Rfl: 3   Vibegron (GEMTESA) 75 MG TABS, Take 75 mg by mouth daily., Disp: 28 tablet, Rfl: 0   ELIQUIS 5 MG TABS tablet, Take 5 mg by mouth 2 (two) times daily. (Patient not taking: Reported on 08/14/2021), Disp: , Rfl:     Family History  Problem Relation Age of Onset   Prostate cancer Brother    Kidney disease Neg Hx    Bladder Cancer Neg Hx      Social History   Tobacco Use   Smoking status: Former    Packs/day: 0.50    Types: Cigarettes   Smokeless tobacco: Never   Tobacco comments:    quit 40 years  Vaping Use   Vaping Use: Never used  Substance Use Topics   Alcohol use: No    Alcohol/week: 0.0 standard drinks   Drug use: No    Allergies as of 08/14/2021 - Review Complete 08/14/2021  Allergen Reaction Noted   Seroquel [quetiapine fumarate]  07/22/2020   Trazodone Other (See Comments) 11/04/2020   Clindamycin hcl Rash 08/08/2015   Sulfamethoxazole-trimethoprim Rash 07/08/2015    Review of  Systems:    All systems reviewed and negative except where noted in HPI.   Physical Exam:  BP 119/65 (BP Location: Left Arm, Patient Position: Sitting, Cuff Size: Normal)   Pulse 67   Temp (!) 97.2 F (36.2 C) (Temporal)   Ht 5\' 6"  (1.676 m)   Wt 154 lb 3.2 oz (69.9 kg)   BMI 24.89 kg/m  No LMP for male patient.  General:   Alert, thin built, moderately nourished, pleasant and cooperative in NAD Head:  Normocephalic and atraumatic. Eyes:  Sclera clear, no icterus.   Conjunctiva pink. Ears:  Normal auditory acuity. Nose:  No deformity, discharge, or lesions. Mouth:  No deformity or lesions,oropharynx pink & moist. Neck:  Supple; no masses or thyromegaly. Lungs:  Respirations even and unlabored.  Clear throughout to auscultation.   No wheezes, crackles, or rhonchi. No acute distress. Heart:  Regular rate and rhythm; no murmurs, clicks, rubs, or gallops. Abdomen:  Normal bowel sounds. Soft, non-tender and non-distended without masses, hepatosplenomegaly or hernias noted.  No guarding or rebound tenderness.   Rectal: Normal perianal skin, mild tenderness on digital rectal exam, anoscopy revealed prolapse of the internal and external hemorrhoids, large left lateral external hemorrhoid and right posterior external hemorrhoid Msk:  Symmetrical without gross deformities. Good, equal movement & strength bilaterally. Pulses:  Normal pulses noted. Extremities:  No clubbing or edema.  No cyanosis. Neurologic:  Alert and oriented x3;  grossly normal neurologically. Skin:  Intact without significant lesions or rashes. No jaundice. Psych:  Alert and cooperative. Normal mood and affect.  Imaging Studies: Reviewed no abdominal imaging recently  Assessment and Plan:   Wildon Cuevas is a 87 y.o. male with dementia, history of A. fib, previously on Eliquis, history of gallstone pancreatitis, status postcholecystectomy and ERCP in 2016 with CBD stone extraction and biliary sphincterotomy is seen  in consultation for 3 months history of chronic nonbloody diarrhea and symptomatic hemorrhoids  Chronic nonbloody diarrhea Recommend pancreatic fecal elastase levels and GI profile PCR Trial of Imodium twice daily and advised to cut back to once a day if not having a bowel movement Patient's Simon clearly indicated that he will not undergo colonoscopy if needed  Symptomatic hemorrhoids, Grade II to III Exacerbated by frequent bowel movements I have discussed about outpatient hemorrhoid ligation, risks and benefits discussed patient's Simon is agreeable, consent obtained Perform ligation of left lateral hemorrhoid today   Follow up in 2 weeks   Cephas Darby, MD

## 2021-08-14 NOTE — Telephone Encounter (Signed)
Called patients daughter about labs ordered she will come by and pick up kit needed for the stool studies

## 2021-08-28 ENCOUNTER — Other Ambulatory Visit: Payer: Self-pay

## 2021-08-28 ENCOUNTER — Ambulatory Visit (INDEPENDENT_AMBULATORY_CARE_PROVIDER_SITE_OTHER): Admitting: Gastroenterology

## 2021-08-28 ENCOUNTER — Encounter: Payer: Self-pay | Admitting: Gastroenterology

## 2021-08-28 VITALS — BP 128/72 | HR 61 | Temp 97.4°F | Ht 66.0 in | Wt 155.2 lb

## 2021-08-28 DIAGNOSIS — K642 Third degree hemorrhoids: Secondary | ICD-10-CM | POA: Diagnosis not present

## 2021-08-28 NOTE — Progress Notes (Addendum)
PROCEDURE NOTE: The patient presents with symptomatic grade 2 hemorrhoids, unresponsive to maximal medical therapy, requesting rubber band ligation of his/her hemorrhoidal disease.  All risks, benefits and alternative forms of therapy were described and informed consent was obtained.  Patient is accompanied by his daughter.  His diarrhea has resolved.  Taking Imodium as needed occasionally.  His rectal discomfort has modestly improved  The decision was made to band the LL internal hemorrhoid, and the [place] was used to perform band ligation without complication.  Digital anorectal examination was then performed to assure proper positioning of the band, and to adjust the banded tissue as required.  The patient was discharged home without pain or other issues.  Dietary and behavioral recommendations were given and (if necessary - prescriptions were given), along with follow-up instructions.  The patient will return 2 weeks for follow-up and possible additional banding as required.  No complications were encountered and the patient tolerated the procedure well.

## 2021-09-11 ENCOUNTER — Encounter: Payer: Self-pay | Admitting: Gastroenterology

## 2021-09-11 ENCOUNTER — Other Ambulatory Visit: Payer: Self-pay

## 2021-09-11 ENCOUNTER — Ambulatory Visit (INDEPENDENT_AMBULATORY_CARE_PROVIDER_SITE_OTHER): Payer: Medicare HMO | Admitting: Gastroenterology

## 2021-09-11 VITALS — BP 101/62 | HR 59 | Temp 97.2°F | Ht 66.0 in | Wt 153.0 lb

## 2021-09-11 DIAGNOSIS — K642 Third degree hemorrhoids: Secondary | ICD-10-CM

## 2021-09-11 NOTE — Progress Notes (Signed)
PROCEDURE NOTE: The patient presents with symptomatic grade 2 hemorrhoids, unresponsive to maximal medical therapy, requesting rubber band ligation of his/her hemorrhoidal disease.  All risks, benefits and alternative forms of therapy were described and informed consent was obtained.  Patient is accompanied by his daughter.  His diarrhea has resolved.  Taking Imodium as needed occasionally.  His rectal discomfort has significantly improved  The decision was made to band the RA internal hemorrhoid, and the [place] was used to perform band ligation without complication.  Digital anorectal examination was then performed to assure proper positioning of the band, and to adjust the banded tissue as required.  The patient was discharged home without pain or other issues.  Dietary and behavioral recommendations were given and (if necessary - prescriptions were given), along with follow-up instructions.  The patient will return as needed for follow-up and possible additional banding as required.  No complications were encountered and the patient tolerated the procedure well.

## 2021-10-26 NOTE — Progress Notes (Deleted)
10/27/2021 8:26 PM   Chris Simon Chris Simon [birth year] [id number]  Referring provider: Sofie Simon, [place],  [place] [address]  No chief complaint on file.  Urological history 1. High risk hematuria - former smoker - CTU 02/2020 small nonobstructive calculus of the midportion of the left kidney. Two small adjacent calculi in the dependent left urinary bladder adjacent to the ureteropelvic junction measuring 2-3 mm. No hydronephrosis.  No evidence of mass or urinary tract filling defect.  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  The second stricture was more dense in appearance, unable to advance scope beyond this level -no reports of gross heme  2. Bulbar strictures  3. Elevated PSA -8.1 in 2016 -screening has been discontinued  4. BPH with LU TS - severe prostatomegaly on CTU 02/2020 - managed with finasteride 5 mg daily and tamsulosin 0.4 mg daily   HPI: Chris Simon is a 88 y.o. male who presents today with his daughter, Chris Simon, for follow up.   ***  PMH: Past Medical History:  Diagnosis Date   Aneurysm ([place])    Abdominal Aortic Aneurysm   Atrial fibrillation ([facility])    Blockage of a bile duct    BPH with obstruction/lower urinary tract symptoms    Cellulitis of scrotum    COPD (chronic obstructive pulmonary disease) ([place])    Dementia ([place])    Diverticulosis    Dyspnea    with exertion   Dysrhythmia    Elevated PSA    Epididymitis    Gallstone    Gross hematuria    History of kidney stones    Hyperlipidemia    Kidney stones    Nocturia    Sepsis secondary to UTI ([facility]) 07/20/2015   Testicular pain    TIA (transient ischemic attack)    June 2018   Urinary retention     Surgical History: Past Surgical History:  Procedure Laterality Date   CHOLECYSTECTOMY N/A  07/11/2015   Procedure: LAPAROSCOPIC CHOLECYSTECTOMY;  Surgeon: Rolm Bookbinder, MD;  Location: [facility];  Service: General;  Laterality: N/A;   ERCP N/A 07/09/2015   Procedure: ENDOSCOPIC RETROGRADE CHOLANGIOPANCREATOGRAPHY (ERCP);  Surgeon: Clarene Essex, MD;  Location: [facility] ENDOSCOPY;  Service: Endoscopy;  Laterality: N/A;   INGUINAL HERNIA REPAIR Left 04/07/2017   Procedure: HERNIA REPAIR INGUINAL ADULT;  Surgeon: Clayburn Pert, MD;  Location: [facility];  Service: General;  Laterality: Left;   INSERTION OF MESH Left 04/07/2017   Procedure: INSERTION OF MESH;  Surgeon: Clayburn Pert, MD;  Location: [facility];  Service: General;  Laterality: Left;   KYPHOPLASTY N/A 11/16/2018   Procedure: KYPHOPLASTY T12;  Surgeon: Hessie Knows, MD;  Location: [facility];  Service: Orthopedics;  Laterality: N/A;   LEFT HEART CATH AND CORONARY ANGIOGRAPHY N/A 01/10/2018   Procedure: LEFT HEART CATH AND CORONARY ANGIOGRAPHY;  Surgeon: Dionisio David, MD;  Location: [place] [facility];  Service: Cardiovascular;  Laterality: N/A;    Home Medications:  Allergies as of 10/27/2021       Reactions   Seroquel [quetiapine Fumarate]    confusion   Trazodone Other (See Comments)   Irritability, Mood Changes    Clindamycin Hcl Rash   Unknown reaction  Reaction:  Unknown  Unknown reaction    Sulfamethoxazole-trimethoprim Rash        Medication List  Accurate as of October 26, 2021  8:26 PM. If you have any questions, ask your nurse or doctor.          acetaminophen 325 MG tablet Commonly known as: TYLENOL Take 650 mg by mouth every 6 (six) hours as needed.   albuterol 108 (90 Base) MCG/ACT inhaler Commonly known as: VENTOLIN HFA Inhale 1-2 puffs into the lungs every 4 (four) hours as needed for wheezing or shortness of breath.   amiodarone 200 MG tablet Commonly known as: PACERONE Take 200 mg by mouth at bedtime.   B-12 2500 MCG Tabs Take 2,500 mg by mouth daily.   benzonatate 200 MG  capsule Commonly known as: TESSALON Take by mouth.   budesonide 0.5 MG/2ML nebulizer solution Commonly known as: PULMICORT Take by nebulization.   carvedilol 3.125 MG tablet Commonly known as: COREG Take 1 tablet (3.125 mg total) by mouth 2 (two) times daily.   cephALEXin 500 MG capsule Commonly known as: KEFLEX Take 1 capsule (500 mg total) by mouth 2 (two) times daily.   donepezil 10 MG tablet Commonly known as: ARICEPT Take 10 mg by mouth at bedtime.   Eliquis 5 MG Tabs tablet Generic drug: apixaban Take 5 mg by mouth 2 (two) times daily.   finasteride 5 MG tablet Commonly known as: PROSCAR TAKE 1 TABLET(5 MG) BY MOUTH DAILY   fluticasone 50 MCG/ACT nasal spray Commonly known as: FLONASE Place into both nostrils.   furosemide 20 MG tablet Commonly known as: LASIX Take 20 mg by mouth daily as needed. For feet swelling.   Gemtesa 75 MG Tabs Generic drug: Vibegron Take 75 mg by mouth daily.   hydrochlorothiazide 25 MG tablet Commonly known as: HYDRODIURIL Take 25 mg by mouth daily.   Hydrocortisone (Perianal) 1 % Crea Apply topically 3 (three) times daily.   hydrocortisone 2.5 % rectal cream Commonly known as: ANUSOL-HC Apply topically 3 (three) times daily.   hydrocortisone 2.5 % cream Apply topically 3 (three) times daily.   isosorbide mononitrate 30 MG 24 hr tablet Commonly known as: IMDUR Take by mouth.   lisinopril 20 MG tablet Commonly known as: ZESTRIL Take 20 mg by mouth daily.   loratadine 10 MG tablet Commonly known as: CLARITIN Take 10 mg by mouth daily as needed for allergies.   Melatonin 10 MG Tabs Take by mouth.   memantine 10 MG tablet Commonly known as: NAMENDA Take 1 tablet by mouth 2 (two) times daily.   memantine 10 MG tablet Commonly known as: NAMENDA Take 10 mg by mouth 2 (two) times daily.   mirtazapine 15 MG tablet Commonly known as: REMERON Take 15 mg by mouth at bedtime.   mirtazapine 15 MG tablet Commonly  known as: REMERON Take by mouth.   multivitamin with minerals Tabs tablet Take 1 tablet by mouth daily.   nystatin cream Commonly known as: MYCOSTATIN Apply topically 2 (two) times daily.   predniSONE 5 MG tablet Commonly known as: DELTASONE Take by mouth.   predniSONE 20 MG tablet Commonly known as: DELTASONE Take 20 mg by mouth 2 (two) times daily.   rosuvastatin 40 MG tablet Commonly known as: CRESTOR Take 40 mg by mouth at bedtime.   tamsulosin 0.4 MG Caps capsule Commonly known as: FLOMAX TAKE 1 CAPSULE BY MOUTH EVERY DAY   temazepam 15 MG capsule Commonly known as: RESTORIL Take 15 mg by mouth at bedtime as needed.        Allergies:  Allergies  Allergen Reactions   Seroquel [  Quetiapine Fumarate]     confusion   Trazodone Other (See Comments)    Irritability, Mood Changes    Clindamycin Hcl Rash    Unknown reaction  Reaction:  Unknown  Unknown reaction    Sulfamethoxazole-Trimethoprim Rash    Family History: Family History  Problem Relation Age of Onset   Prostate cancer Brother    Kidney disease Neg Hx    Bladder Cancer Neg Hx     Social History:  reports that he has quit smoking. His smoking use included cigarettes. He smoked an average of .5 packs per day. He has never used smokeless tobacco. He reports that he does not drink alcohol and does not use drugs.  ROS: Pertinent ROS in HPI  Physical Exam: There were no vitals taken for this visit.  Constitutional:  Well nourished. Alert and oriented, No acute distress. HEENT: [place] AT, moist mucus membranes.  Trachea midline Cardiovascular: No clubbing, cyanosis, or edema. Respiratory: Normal respiratory effort, no increased work of breathing. GU: No CVA tenderness.  No bladder fullness or masses.  Patient with circumcised/uncircumcised phallus. ***Foreskin easily retracted***  Urethral meatus is patent.  No penile discharge. No penile lesions or rashes. Scrotum without lesions, cysts, rashes and/or  edema.  Testicles are located scrotally bilaterally. No masses are appreciated in the testicles. Left and right epididymis are normal. Rectal: Patient with  normal sphincter tone. Anus and perineum without scarring or rashes. No rectal masses are appreciated. Prostate is approximately *** grams, *** nodules are appreciated. Seminal vesicles are normal. Neurologic: Grossly intact, no focal deficits, moving all 4 extremities. Psychiatric: Normal mood and affect. .  Laboratory Data: Sodium 135 - 145 mmol/L 141   Potassium 3.4 - 4.8 mmol/L 4.3   Chloride 98 - 107 mmol/L 104   CO2 20.0 - 31.0 mmol/L 32.3 High    Anion Gap 5 - 14 mmol/L 5   BUN 9 - 23 mg/dL 22   Creatinine 0.60 - 1.10 mg/dL 1.49 High    BUN/Creatinine Ratio  15   eGFR CKD-EPI (2021) Male >=60 mL/min/1.32m 46 Low    Comment: eGFR calculated with CKD-EPI 2021 equation in accordance with [employer] Ne[employer] Task Force recommendations.  Glucose 70 - 179 mg/dL 103   Calcium 8.7 - 10.4 mg/dL 8.6 Low    Albumin 3.4 - 5.0 g/dL 3.1 Low    Total Protein 5.7 - 8.2 g/dL 6.2   Total Bilirubin 0.3 - 1.2 mg/dL 1.0   AST <=34 U/L 16   ALT 10 - 49 U/L 18   Alkaline Phosphatase 46 - 116 U/L 95   [facility]  Specimen Collected: 05/09/21 19:55 Last Resulted: 05/09/21 20:38  Received From: [facility] Result Received: 05/21/21 14:39   WBC 3.6 - 11.2 10*9/L 10.7   RBC 4.26 - 5.60 10*12/L 4.37   HGB 12.9 - 16.5 g/dL 13.6   HCT 39.0 - 48.0 % 41.1   MCV 77.6 - 95.7 fL 94.1   MCH 25.9 - 32.4 pg 31.0   MCHC 32.0 - 36.0 g/dL 33.0   RDW 12.2 - 15.2 % 14.5   MPV 6.8 - 10.7 fL 8.6   Platelet 150 - 450 10*9/L 177   nRBC <=4 /100 WBCs 0   Neutrophils % % 69.4   Lymphocytes % % 16.2   Monocytes % % 12.3   Eosinophils % % 1.3   Basophils % % 0.8   Absolute Neutrophils 1.8 -  7.8 10*9/L 7.4   Absolute Lymphocytes 1.1 - 3.6 10*9/L 1.7   Absolute Monocytes 0.3 - 0.8  10*9/L 1.3 High    Absolute Eosinophils 0.0 - 0.5 10*9/L 0.1   Absolute Basophils 0.0 - 0.1 10*9/L 0.1   Resulting Agency  [facility]  Specimen Collected: 05/09/21 19:54 Last Resulted: 05/09/21 20:16  Received From: [facility]  Result Received: 05/21/21 14:39  I have reviewed the labs.   Pertinent Imaging: N/A   Assessment & Plan:    1. BPH with LUTS -PVR < 300 cc -symptoms - *** -most bothersome symptoms are *** -continue conservative management, avoiding bladder irritants and timed voiding's -Initiate alpha-blocker (***), discussed side effects *** -Initiate 5 alpha reductase inhibitor (***), discussed side effects *** -Continue tamsulosin 0.4 mg daily, alfuzosin 10 mg daily, Rapaflo 8 mg daily, terazosin, doxazosin, Cialis 5 mg daily and finasteride 5 mg daily, dutasteride 0.5 mg daily***:refills given -Cannot tolerate medication or medication failure, schedule cystoscopy ***     No follow-ups on file.  These notes generated with voice recognition software. I apologize for typographical errors.  Zara Council, PA-C  [facility] [address]  [place], [place] [address] [phone]

## 2021-10-27 ENCOUNTER — Ambulatory Visit: Payer: Medicare HMO | Admitting: Urology

## 2021-10-27 DIAGNOSIS — N138 Other obstructive and reflux uropathy: Secondary | ICD-10-CM

## 2021-10-30 ENCOUNTER — Encounter: Payer: Self-pay | Admitting: Urology

## 2022-02-25 DEATH — deceased
